# Patient Record
Sex: Female | Born: 1990
Health system: Southern US, Community
[De-identification: ages and names within clinical notes are randomized; demographics above are authoritative.]

## PROBLEM LIST (undated history)

## (undated) DIAGNOSIS — L0390 Cellulitis, unspecified: Secondary | ICD-10-CM

## (undated) DIAGNOSIS — J45909 Unspecified asthma, uncomplicated: Secondary | ICD-10-CM

## (undated) DIAGNOSIS — E119 Type 2 diabetes mellitus without complications: Secondary | ICD-10-CM

## (undated) DIAGNOSIS — E669 Obesity, unspecified: Secondary | ICD-10-CM

## (undated) DIAGNOSIS — I10 Essential (primary) hypertension: Secondary | ICD-10-CM

## (undated) DIAGNOSIS — O1495 Unspecified pre-eclampsia, complicating the puerperium: Secondary | ICD-10-CM

## (undated) DIAGNOSIS — F319 Bipolar disorder, unspecified: Secondary | ICD-10-CM

## (undated) DIAGNOSIS — I2699 Other pulmonary embolism without acute cor pulmonale: Secondary | ICD-10-CM

## (undated) HISTORY — PX: NO PAST SURGERIES: SHX2092

---

## 2009-12-04 ENCOUNTER — Emergency Department (HOSPITAL_COMMUNITY): Admission: EM | Admit: 2009-12-04 | Discharge: 2009-12-04 | Payer: Self-pay | Admitting: Emergency Medicine

## 2009-12-04 IMAGING — US US PELVIS COMPLETE MODIFY
1 series · 14 of 25 positions shown · non-contrast
Comparison: None.

CLINICAL DATA: 17-year-old female with pain.

TRANSABDOMINAL AND TRANSVAGINAL ULTRASOUND OF PELVIS
TECHNIQUE: Both transabdominal and transvaginal ultrasound
examinations of the pelvis were performed including evaluation of
the uterus, ovaries, adnexal regions, and pelvic cul-de-sac.

[Series 1: us pelvis complete modify · 0.30mm/px · 14 of 43 slices shown]
[im 1/43]
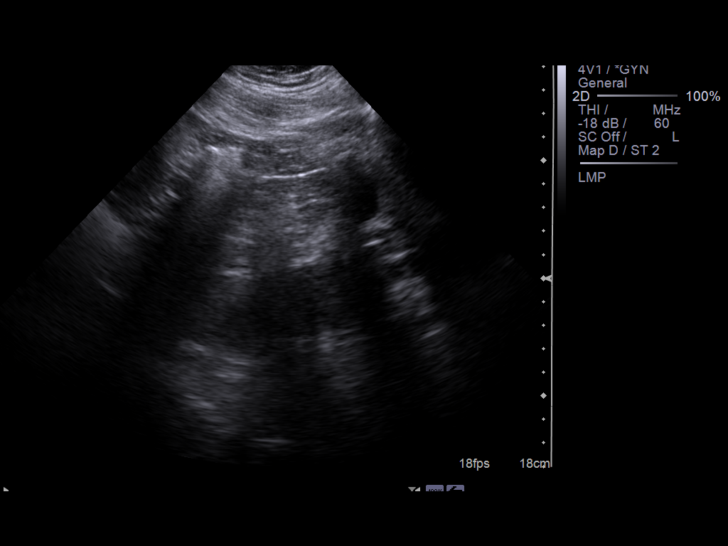
[im 4/43]
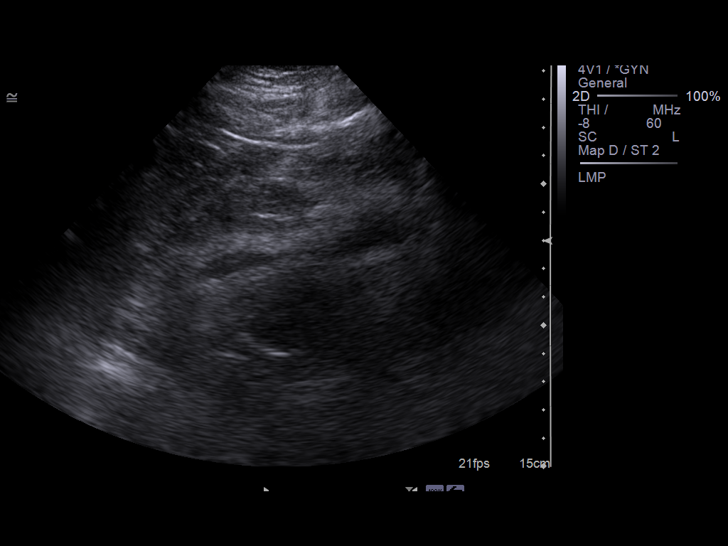
[im 8/43]
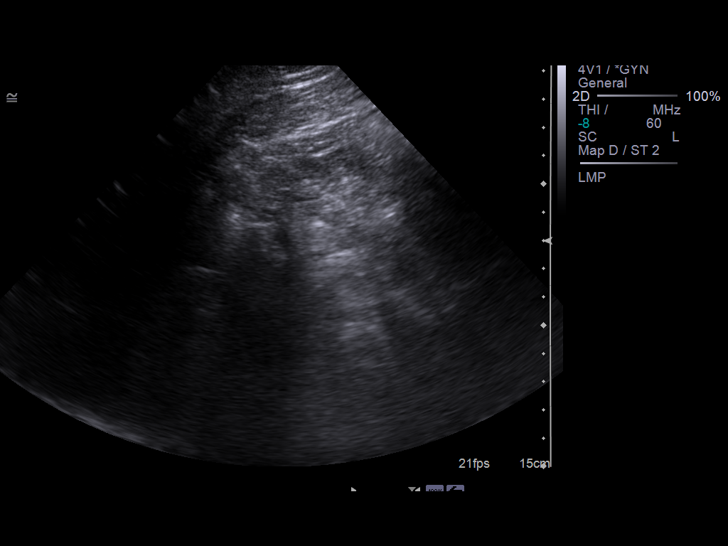
[im 11/43]
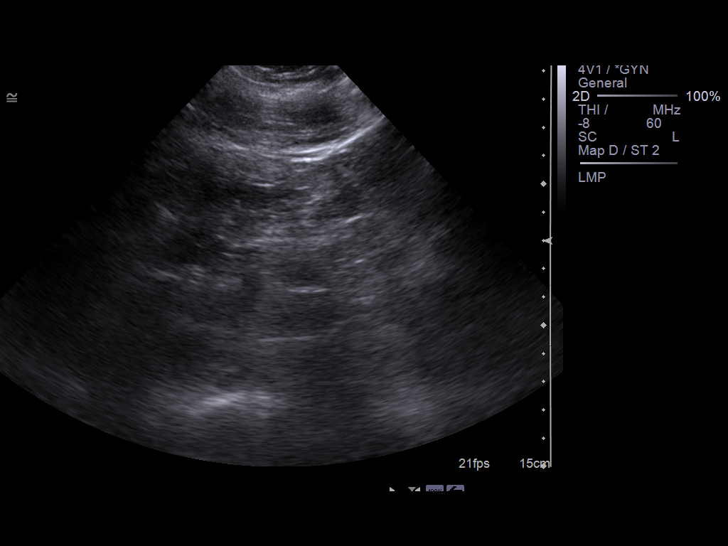
[im 15/43]
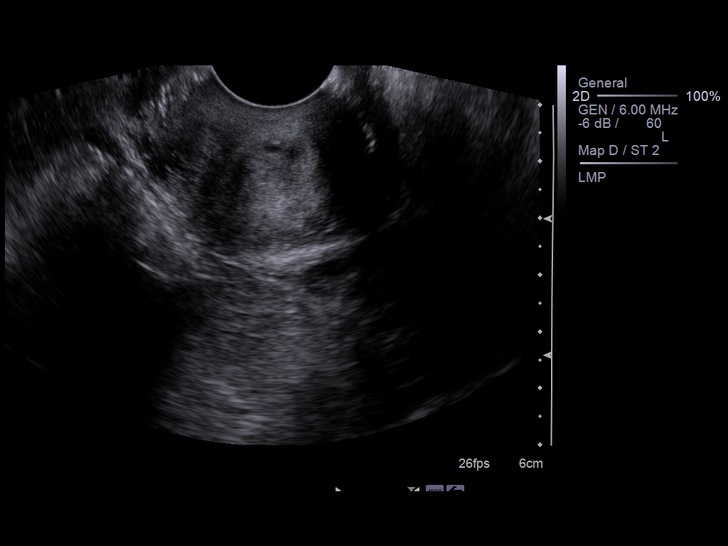
[im 16/43]
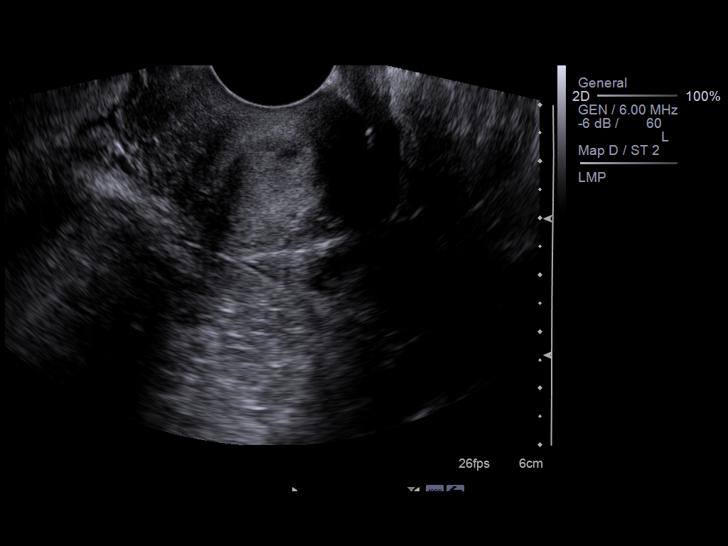
[im 20/43]
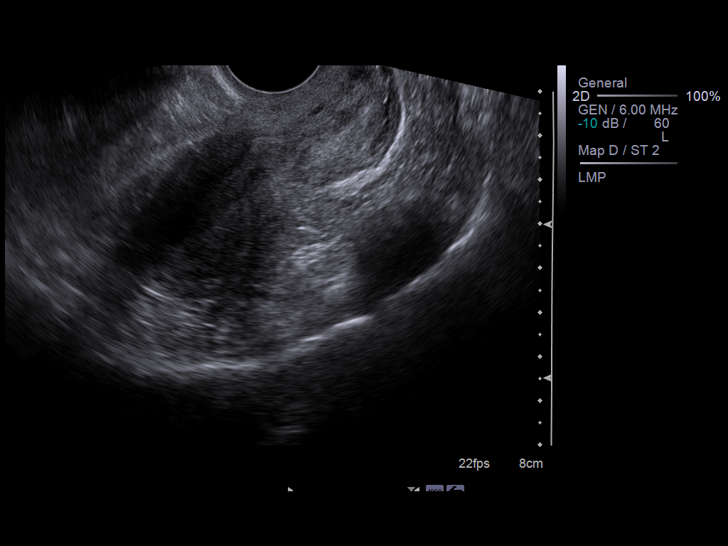
[im 23/43]
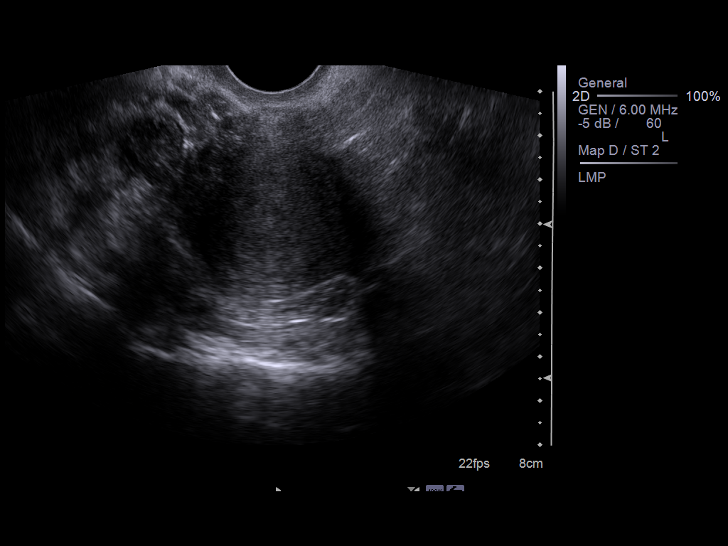
[im 27/43]
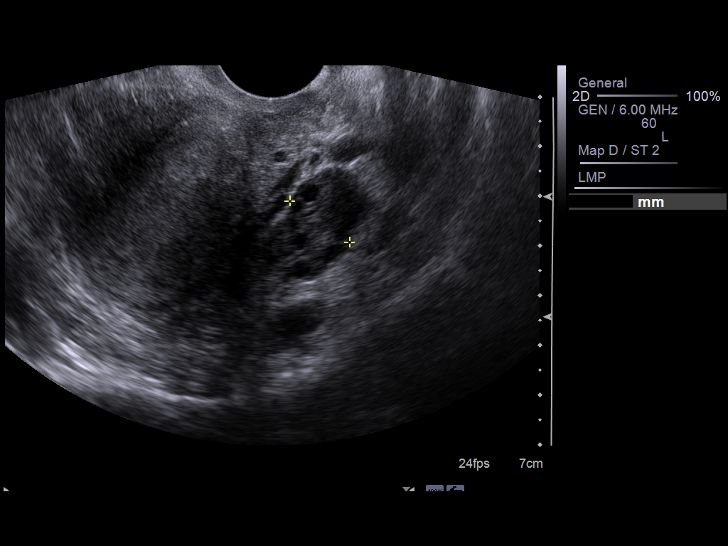
[im 29/43]
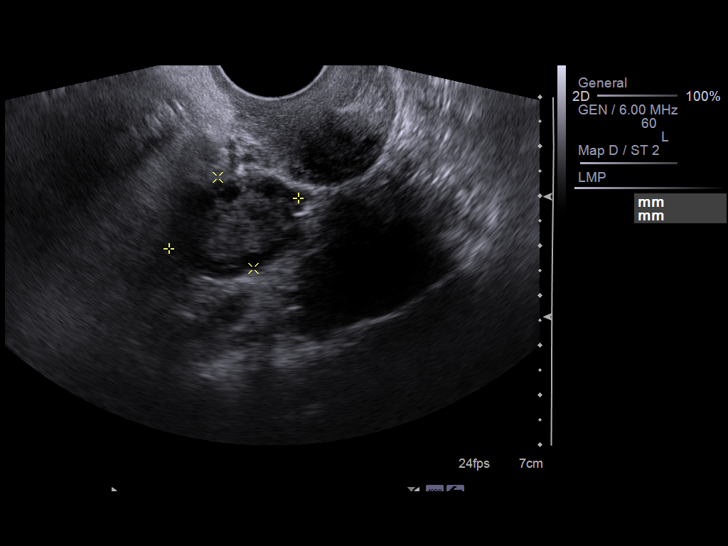
[im 32/43]
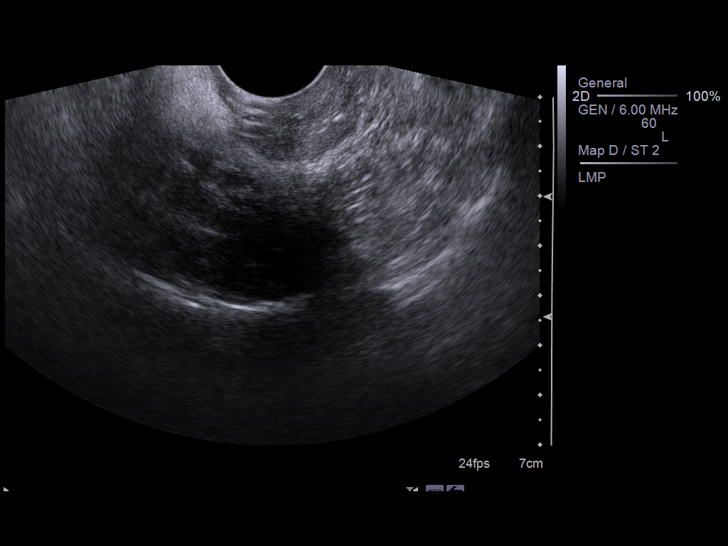
[im 36/43]
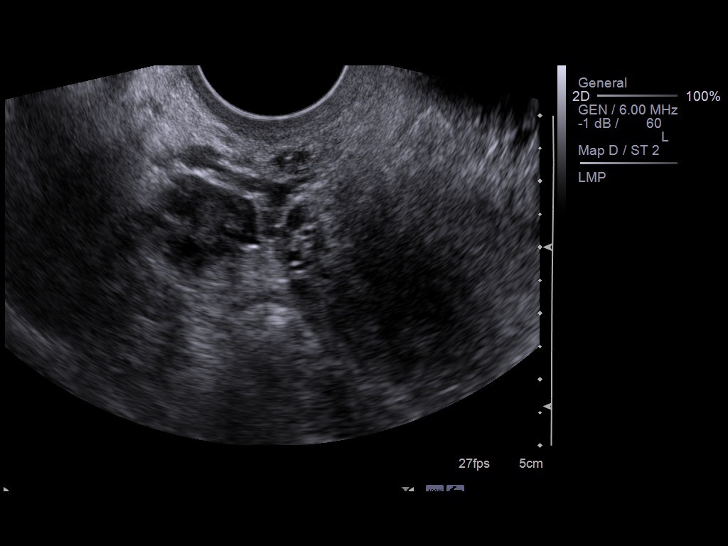
[im 39/43]
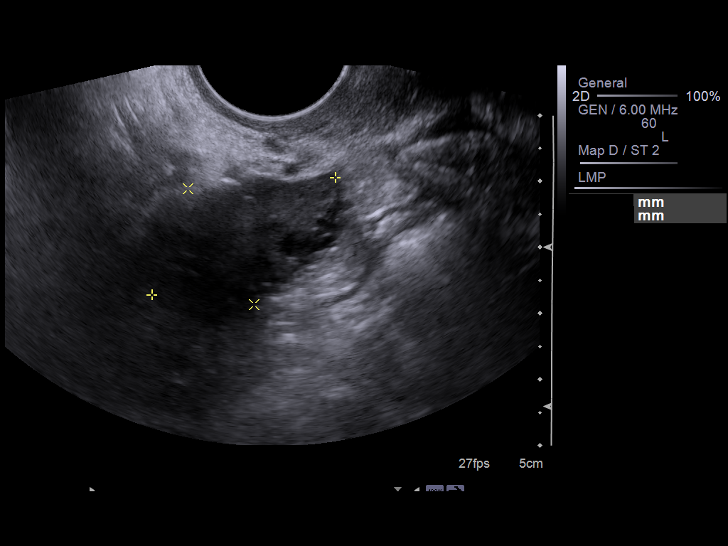
[im 43/43]
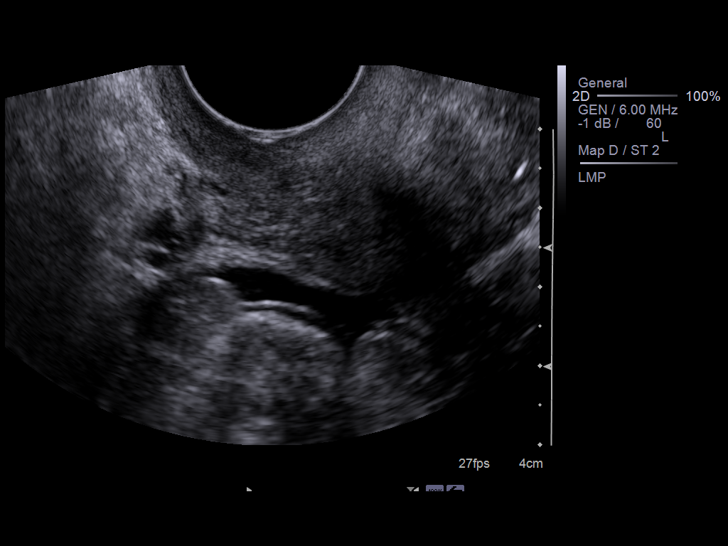

[14 of 25 positions shown; findings below may reference images not displayed]

FINDINGS: Uterus measures 7.2 x 3.3 x 3.9 cm and is within normal limits.

Endometrium measures 6 mm in thickness and is within normal limits.

Right Ovary measures 3.3 x 2.0 x 1.7 cm and is within normal
limits.

Left Ovary measures 2.8 x 2.0 x 1.5 cm and is within normal limits.

Other Findings:  A small volume of simple appearing pelvic free
fluid.
IMPRESSION: Normal.  Likely physiologic small volume of simple fluid.

## 2011-03-16 LAB — GC/CHLAMYDIA PROBE AMP, GENITAL

## 2011-03-16 LAB — COMPREHENSIVE METABOLIC PANEL
ALT: 25 U/L (ref 0–35)
AST: 21 U/L (ref 0–37)
Alkaline Phosphatase: 82 U/L (ref 39–117)
CO2: 24 mEq/L (ref 19–32)
Calcium: 9.3 mg/dL (ref 8.4–10.5)
GFR calc non Af Amer: >60 mL/min (ref >60)
Glucose, Bld: 81 mg/dL (ref 70–99)
Potassium: 3.9 mEq/L (ref 3.5–5.1)
Sodium: 141 mEq/L (ref 135–145)

## 2011-03-16 LAB — DIFFERENTIAL
Basophils Absolute: 0.1 10*3/uL (ref 0.0–0.1)
Eosinophils Absolute: 0.1 10*3/uL (ref 0.0–0.7)
Eosinophils Relative: 1 % (ref 0–5)
Lymphocytes Relative: 38 % (ref 12–46)
Lymphs Abs: 3.4 10*3/uL (ref 0.7–4.0)
Monocytes Absolute: 0.5 10*3/uL (ref 0.1–1.0)

## 2011-03-16 LAB — URINALYSIS, ROUTINE W REFLEX MICROSCOPIC
Bilirubin Urine: NEGATIVE
Glucose, UA: NEGATIVE mg/dL
Ketones, ur: NEGATIVE mg/dL
Protein, ur: NEGATIVE mg/dL
pH: 5.5 (ref 5.0–8.0)

## 2011-03-16 LAB — URINE MICROSCOPIC-ADD ON

## 2011-03-16 LAB — WET PREP, GENITAL: Clue Cells Wet Prep HPF POC: NONE SEEN

## 2011-03-16 LAB — CBC
Platelets: 351 10*3/uL (ref 150–400)
RBC: 4.39 MIL/uL (ref 3.87–5.11)
WBC: 8.9 10*3/uL (ref 4.0–10.5)

## 2011-03-16 LAB — LIPASE, BLOOD: Lipase: 17 U/L (ref 11–59)

## 2011-03-16 LAB — PREGNANCY, URINE

## 2012-04-03 ENCOUNTER — Emergency Department: Payer: Self-pay | Admitting: Emergency Medicine

## 2012-04-03 LAB — CBC
HCT: 38.9 % (ref 35.0–47.0)
MCH: 28.5 pg (ref 26.0–34.0)
MCV: 91 fL (ref 80–100)
RBC: 4.27 10*6/uL (ref 3.80–5.20)
WBC: 11.4 10*3/uL — ABNORMAL HIGH (ref 3.6–11.0)

## 2012-04-03 LAB — COMPREHENSIVE METABOLIC PANEL
Anion Gap: 7 (ref 7–16)
BUN: 14 mg/dL (ref 7–18)
Bilirubin,Total: 0.4 mg/dL (ref 0.2–1.0)
Calcium, Total: 8.9 mg/dL (ref 8.5–10.1)
Chloride: 106 mmol/L (ref 98–107)
Co2: 30 mmol/L (ref 21–32)
EGFR (African American): >60
EGFR (Non-African Amer.): >60
Osmolality: 285 (ref 275–301)
Sodium: 143 mmol/L (ref 136–145)

## 2012-04-03 LAB — TROPONIN I: Troponin-I: <0.02 ng/mL

## 2012-04-03 LAB — CK TOTAL AND CKMB (NOT AT ARMC): CK-MB: 1.2 ng/mL (ref 0.5–3.6)

## 2012-04-03 IMAGING — CR DG CHEST 2V
1 series · 3 of 3 positions shown · non-contrast
Comparison: none

REASON FOR EXAM: chest pain
COMMENTS:

PROCEDURE:     DXR - DXR CHEST PA (OR AP) AND LATERAL  - [DATE]  [DATE]
RESULT:     The lungs are clear. The cardiac silhouette and visualized bony
skeleton are unremarkable.

[Series 1: w chest pa · 0.14mm/px · 3 of 3 slices shown]
[im 1/3]
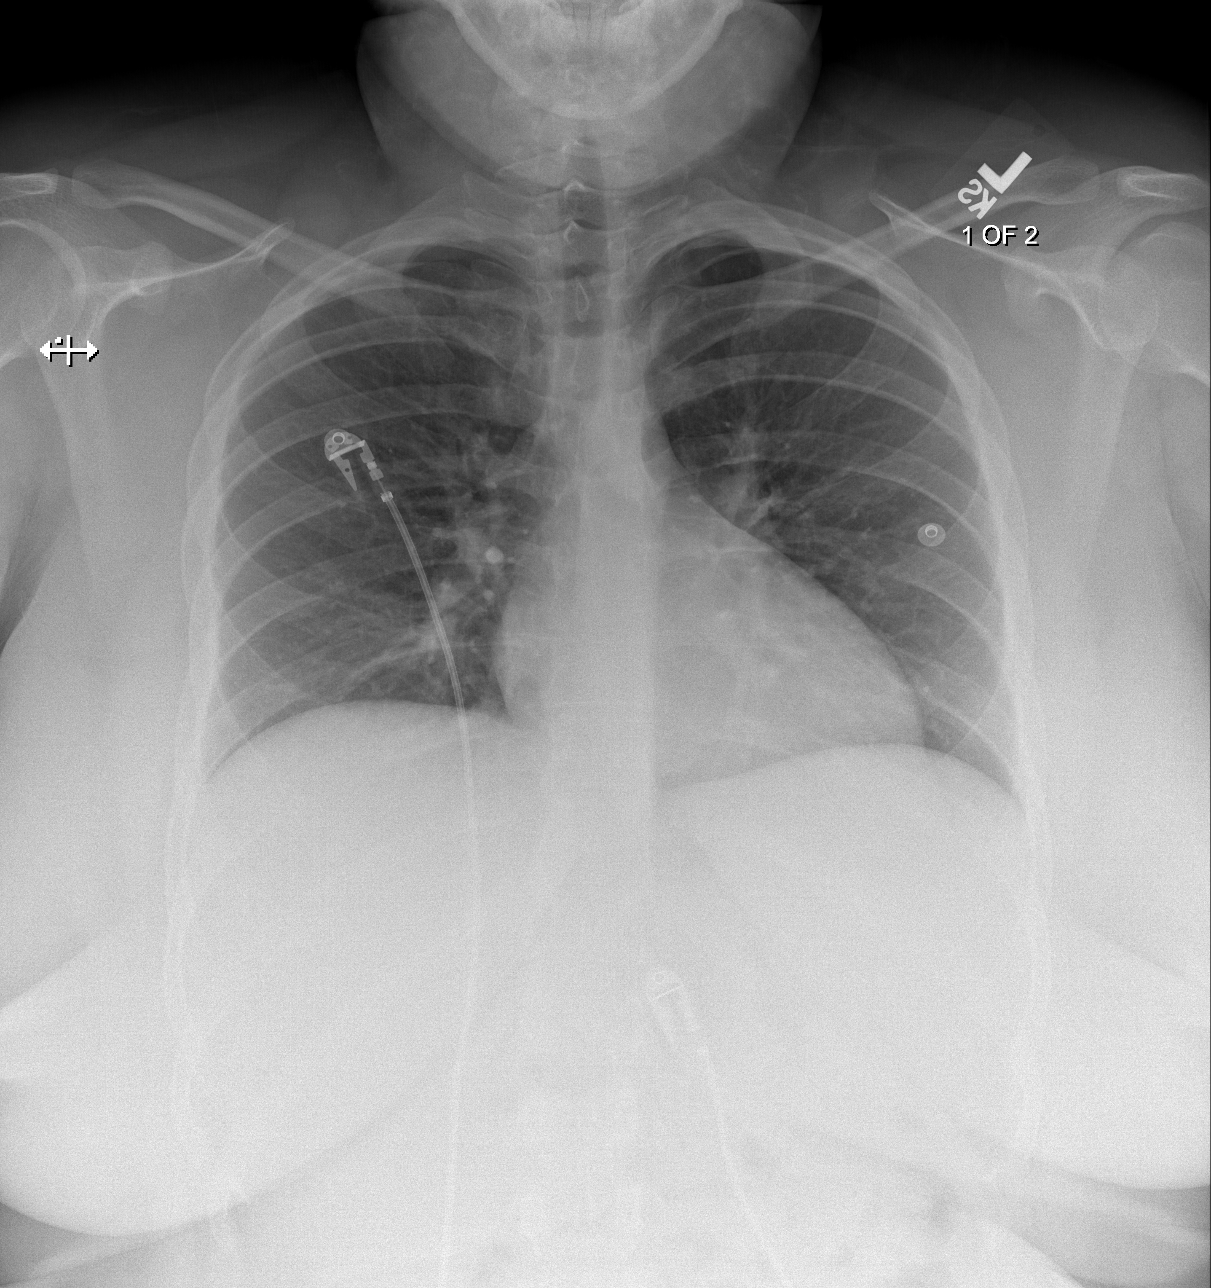
[im 2/3]
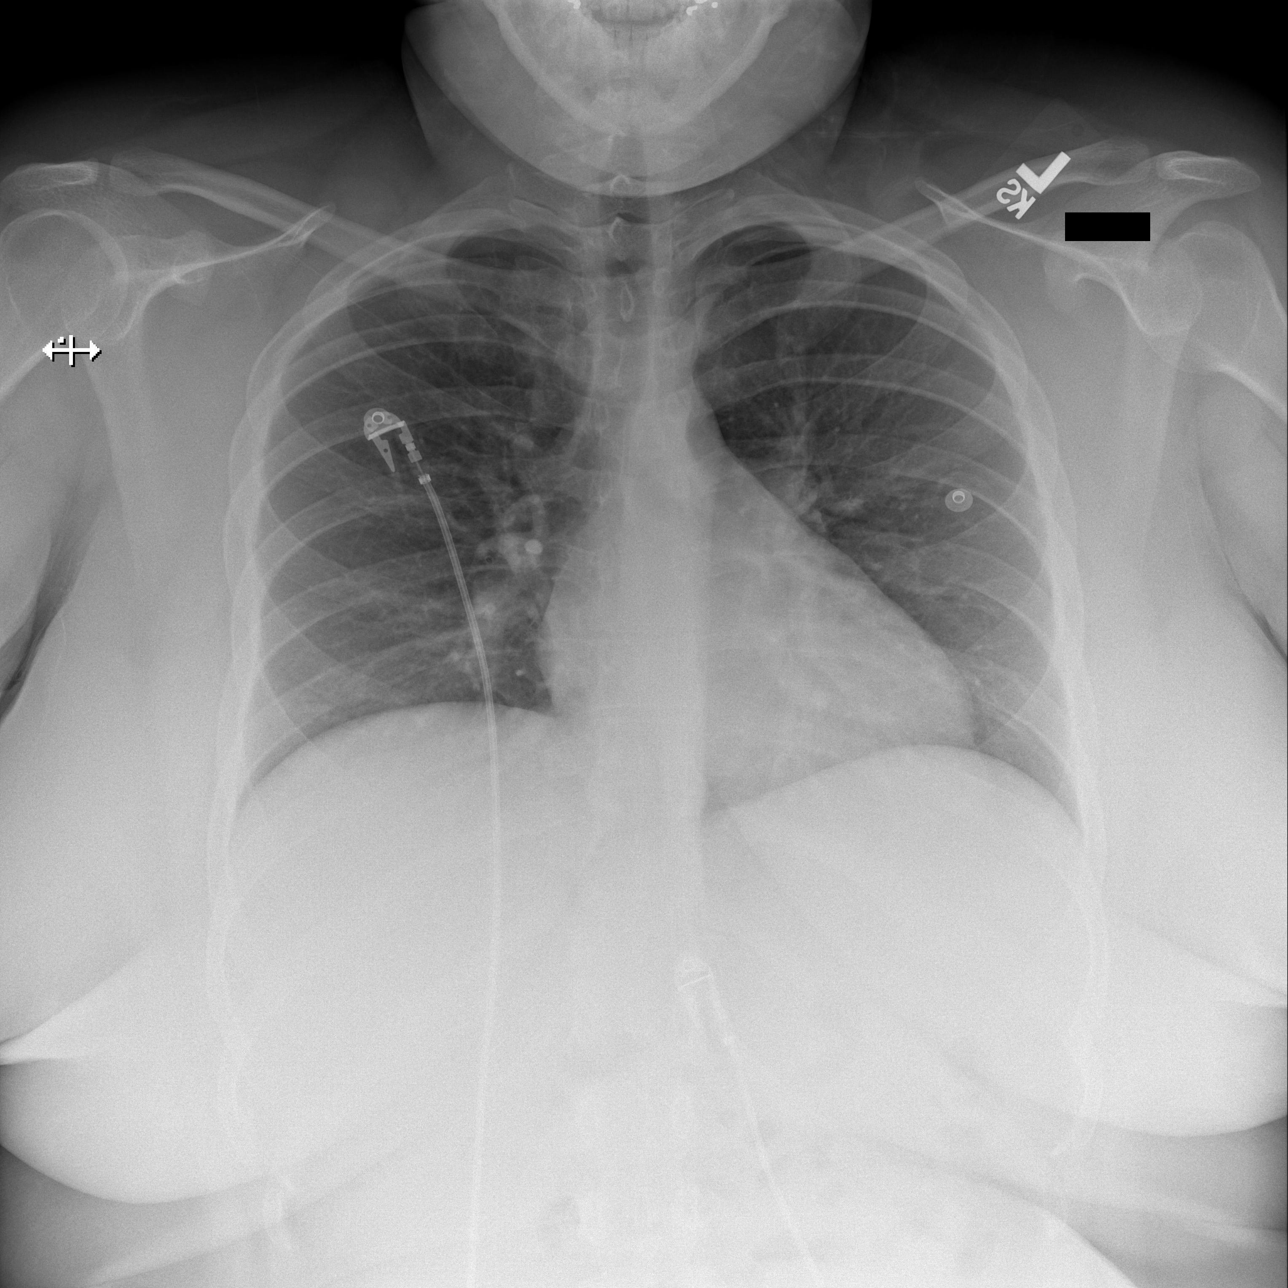
[im 3/3]
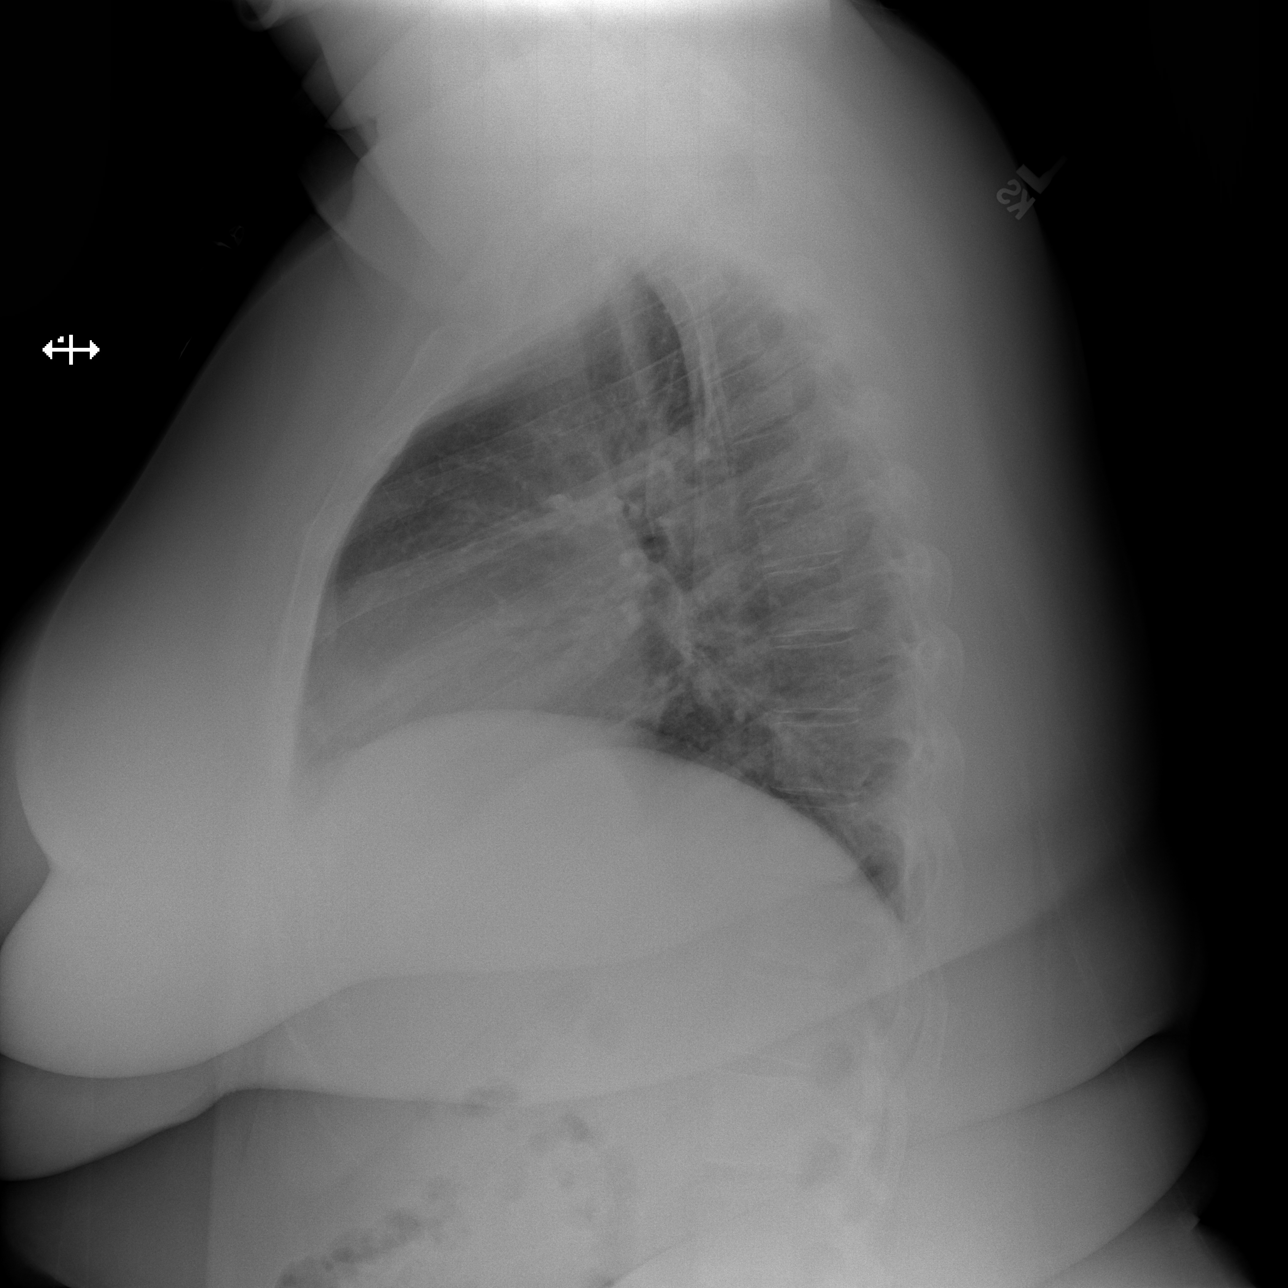

[3 of 3 positions shown; findings below may reference images not displayed]

IMPRESSION: 1. Chest radiograph without evidence of acute cardiopulmonary disease.

## 2013-12-14 DIAGNOSIS — I2699 Other pulmonary embolism without acute cor pulmonale: Secondary | ICD-10-CM

## 2013-12-14 DIAGNOSIS — I82409 Acute embolism and thrombosis of unspecified deep veins of unspecified lower extremity: Secondary | ICD-10-CM

## 2013-12-14 HISTORY — DX: Other pulmonary embolism without acute cor pulmonale: I26.99

## 2013-12-14 HISTORY — DX: Acute embolism and thrombosis of unspecified deep veins of unspecified lower extremity: I82.409

## 2015-11-20 ENCOUNTER — Encounter (HOSPITAL_COMMUNITY): Payer: Self-pay | Admitting: *Deleted

## 2015-11-20 ENCOUNTER — Inpatient Hospital Stay (HOSPITAL_COMMUNITY)
Admission: AD | Admit: 2015-11-20 | Discharge: 2015-11-20 | Discharge status: Against Medical Advice | Payer: Medicaid Other | Source: Ambulatory Visit | Attending: Obstetrics and Gynecology | Admitting: Obstetrics and Gynecology

## 2015-11-20 DIAGNOSIS — H538 Other visual disturbances: Secondary | ICD-10-CM | POA: Diagnosis not present

## 2015-11-20 DIAGNOSIS — R51 Headache: Secondary | ICD-10-CM | POA: Insufficient documentation

## 2015-11-20 DIAGNOSIS — Z3A00 Weeks of gestation of pregnancy not specified: Secondary | ICD-10-CM | POA: Diagnosis not present

## 2015-11-20 DIAGNOSIS — O26891 Other specified pregnancy related conditions, first trimester: Secondary | ICD-10-CM | POA: Diagnosis not present

## 2015-11-20 HISTORY — DX: Unspecified asthma, uncomplicated: J45.909

## 2015-11-20 LAB — GLUCOSE, CAPILLARY: GLUCOSE-CAPILLARY: 119 mg/dL — AB (ref 65–99)

## 2015-11-20 NOTE — MAU Note (Addendum)
Been having problems with blurry vision and bad headache for 2 days, off and on. Some relief with Tylenol.  Denies problems with preg.  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 Next appt is 12/13.  Denies increase in swelling or epigastric pain

## 2016-02-17 ENCOUNTER — Encounter (HOSPITAL_COMMUNITY): Payer: Self-pay | Admitting: *Deleted

## 2016-02-17 ENCOUNTER — Inpatient Hospital Stay (HOSPITAL_COMMUNITY): Payer: Medicaid Other

## 2016-02-17 ENCOUNTER — Inpatient Hospital Stay (HOSPITAL_COMMUNITY)
Admission: AD | Admit: 2016-02-17 | Discharge: 2016-02-17 | Disposition: A | Discharge status: Home / Self Care | Payer: Medicaid Other | Source: Ambulatory Visit | Attending: Family Medicine | Admitting: Family Medicine

## 2016-02-17 DIAGNOSIS — R51 Headache: Secondary | ICD-10-CM | POA: Diagnosis not present

## 2016-02-17 DIAGNOSIS — J45909 Unspecified asthma, uncomplicated: Secondary | ICD-10-CM | POA: Diagnosis not present

## 2016-02-17 DIAGNOSIS — J069 Acute upper respiratory infection, unspecified: Secondary | ICD-10-CM | POA: Diagnosis present

## 2016-02-17 DIAGNOSIS — E119 Type 2 diabetes mellitus without complications: Secondary | ICD-10-CM | POA: Insufficient documentation

## 2016-02-17 HISTORY — DX: Type 2 diabetes mellitus without complications: E11.9

## 2016-02-17 LAB — CBC
HEMATOCRIT: 39.1 % (ref 36.0–46.0)
Hemoglobin: 12.3 g/dL (ref 12.0–15.0)
MCH: 27.8 pg (ref 26.0–34.0)
MCHC: 31.5 g/dL (ref 30.0–36.0)
MCV: 88.3 fL (ref 78.0–100.0)
Platelets: 361 10*3/uL (ref 150–400)
RBC: 4.43 MIL/uL (ref 3.87–5.11)
RDW: 15.3 % (ref 11.5–15.5)
WBC: 12.6 10*3/uL — ABNORMAL HIGH (ref 4.0–10.5)

## 2016-02-17 LAB — URINALYSIS, ROUTINE W REFLEX MICROSCOPIC
BILIRUBIN URINE: NEGATIVE
GLUCOSE, UA: NEGATIVE mg/dL
HGB URINE DIPSTICK: NEGATIVE
Ketones, ur: NEGATIVE mg/dL
Leukocytes, UA: NEGATIVE
Nitrite: NEGATIVE
Protein, ur: NEGATIVE mg/dL
SPECIFIC GRAVITY, URINE: 1.015 (ref 1.005–1.030)
pH: 7 (ref 5.0–8.0)

## 2016-02-17 LAB — POCT PREGNANCY, URINE: Preg Test, Ur: NEGATIVE

## 2016-02-17 IMAGING — CR DG CHEST 2V
2 series · 2 of 2 positions shown · non-contrast
Comparison: [DATE]

CLINICAL DATA: Cough and fever

EXAM:
CHEST  2 VIEW

[chest pa]
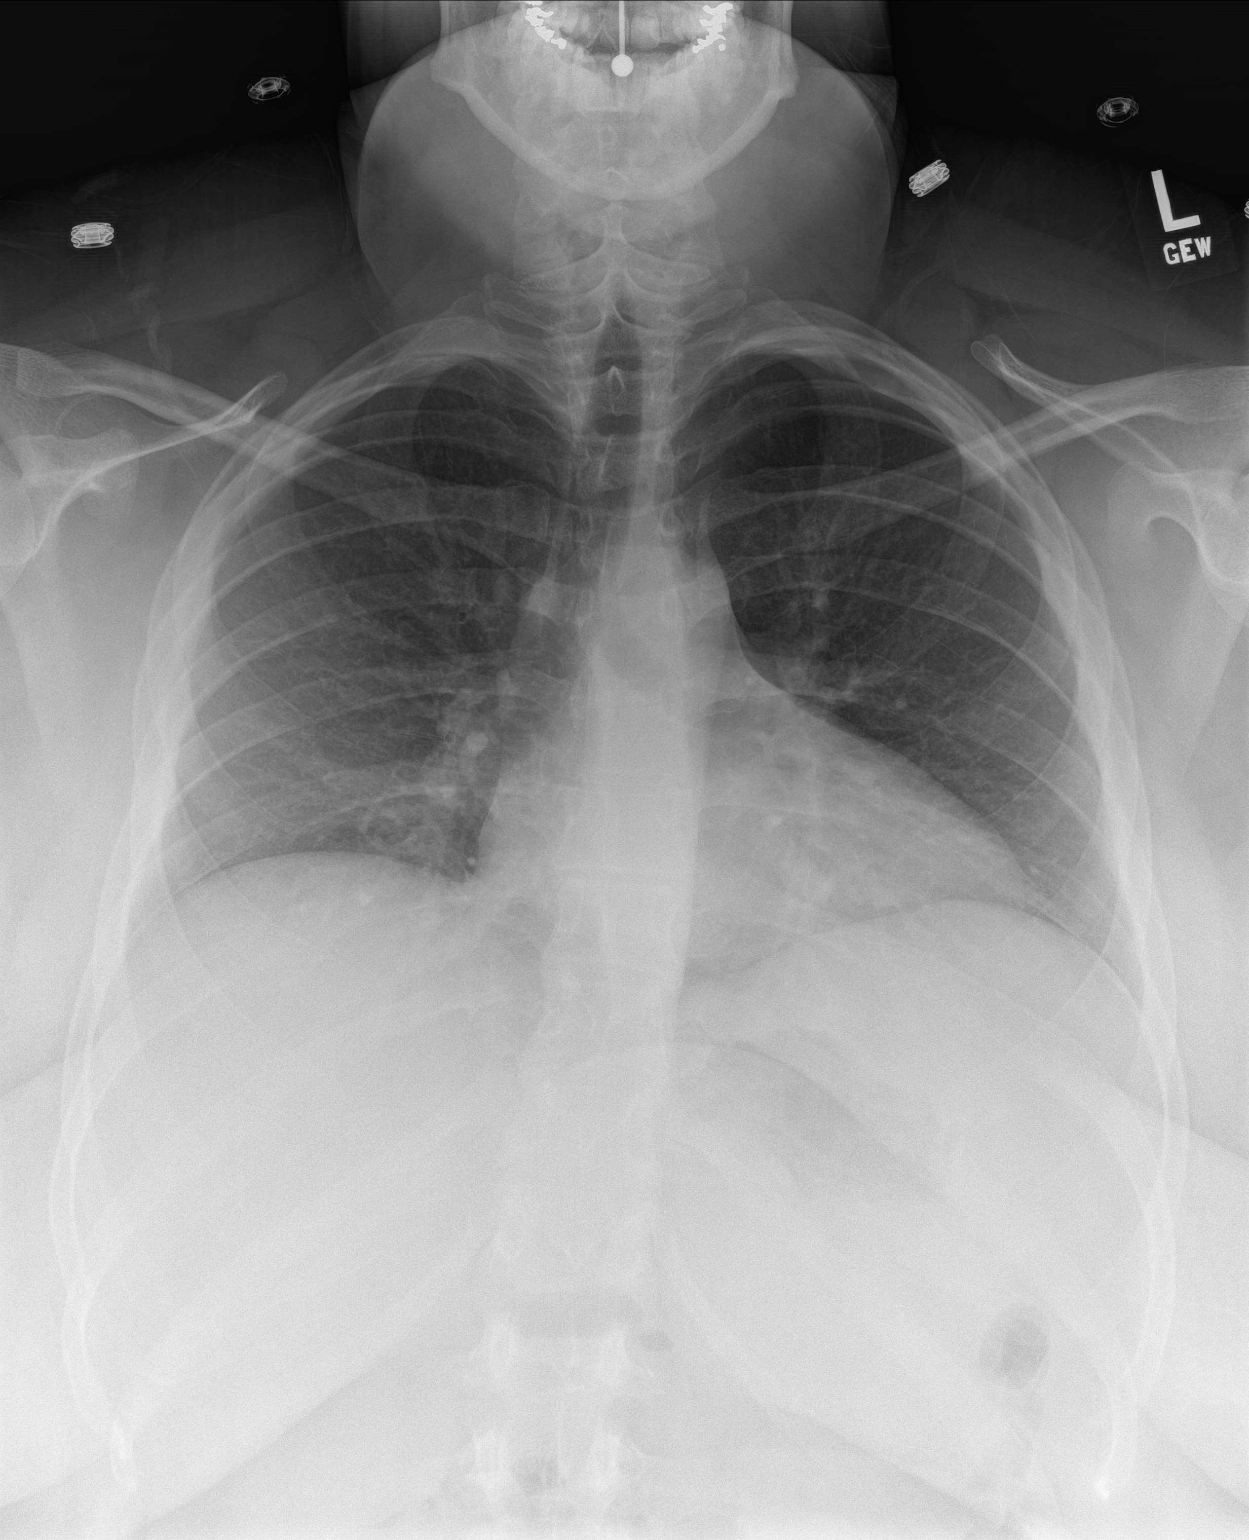

[chest lat]
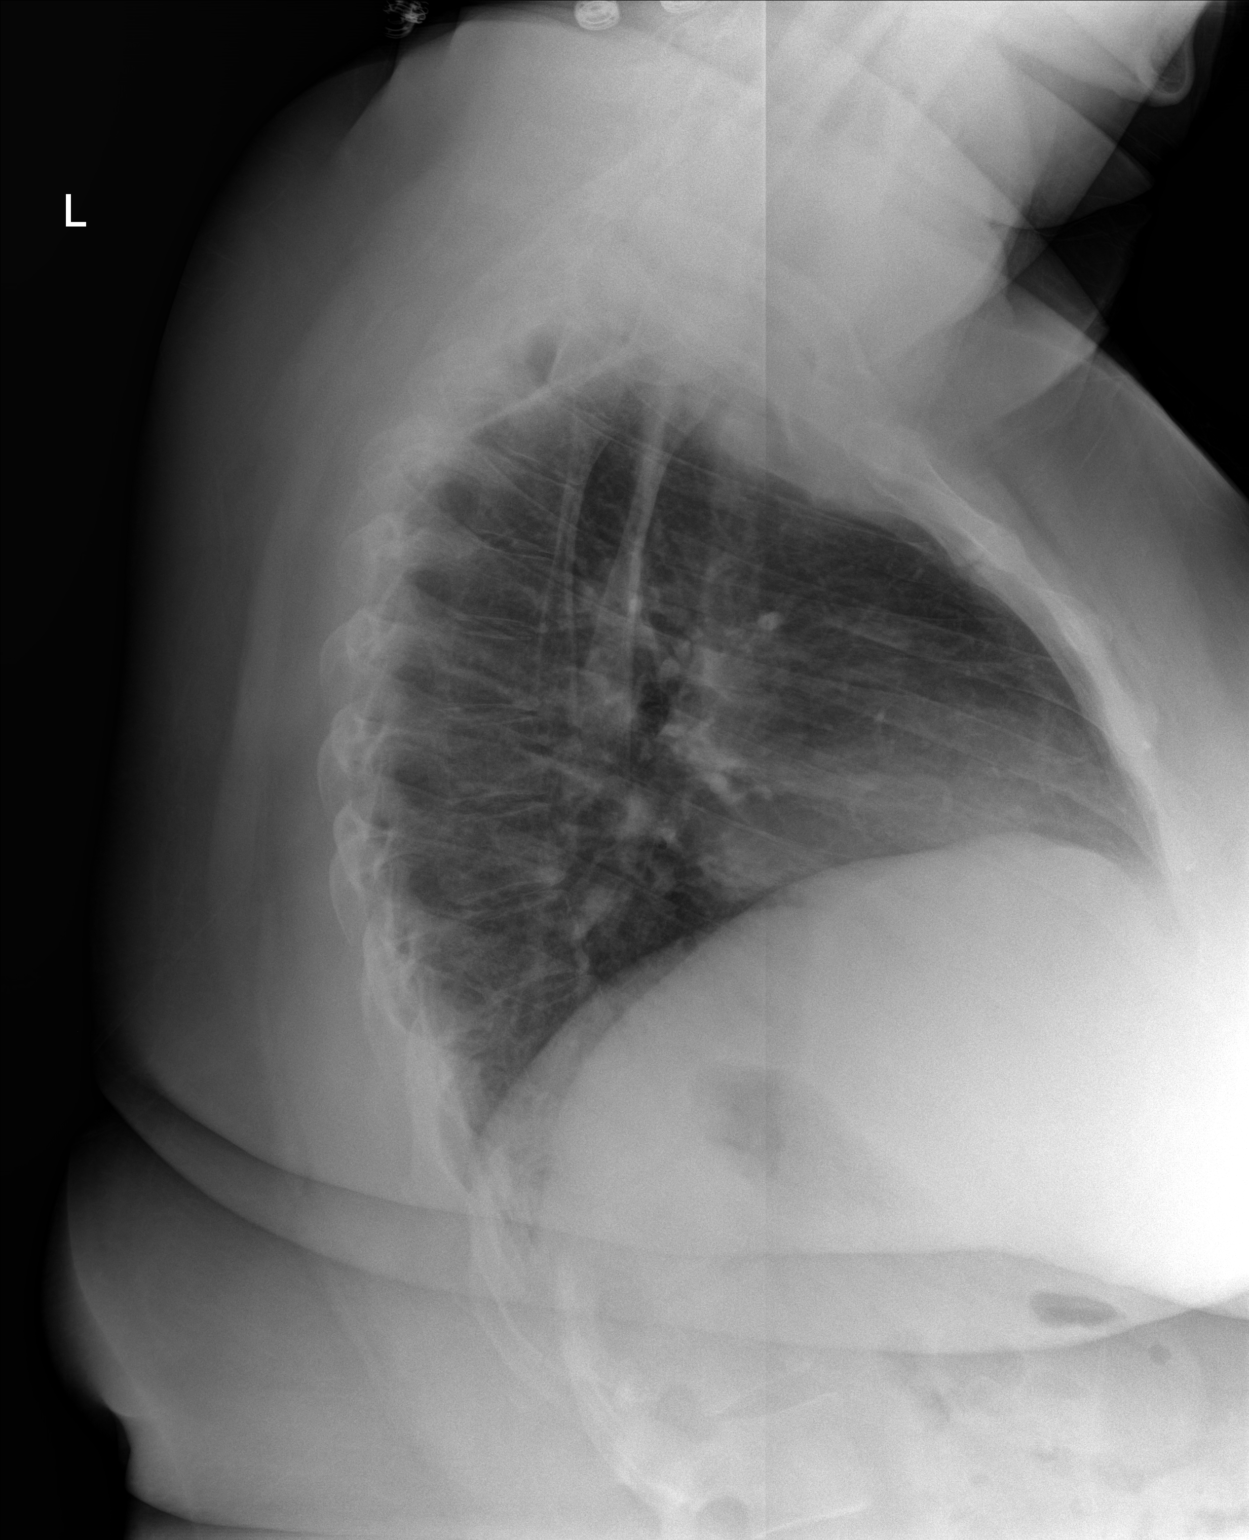

[2 of 2 positions shown; findings below may reference images not displayed]

FINDINGS: The heart size and mediastinal contours are within normal limits.
Both lungs are clear. The visualized skeletal structures are
unremarkable.
IMPRESSION: No active cardiopulmonary disease.

## 2016-02-17 MED ORDER — IPRATROPIUM-ALBUTEROL 0.5-2.5 (3) MG/3ML IN SOLN
3.0000 mL | Freq: Once | RESPIRATORY_TRACT | Status: AC
  Administered 2016-02-17: 3 mL via RESPIRATORY_TRACT
  Filled 2016-02-17: qty 3

## 2016-02-17 MED ORDER — BENZONATATE 100 MG PO CAPS: 100.0000 mg | ORAL_CAPSULE | Freq: Three times a day (TID) | ORAL | Status: DC

## 2016-02-17 MED ORDER — ALBUTEROL SULFATE (2.5 MG/3ML) 0.083% IN NEBU: 2.5000 mg | INHALATION_SOLUTION | Freq: Once | RESPIRATORY_TRACT | Status: DC

## 2016-02-17 MED ORDER — METHYLPREDNISOLONE 4 MG PO TBPK: ORAL_TABLET | ORAL | Status: DC

## 2016-02-17 MED ORDER — IPRATROPIUM BROMIDE 0.02 % IN SOLN: 0.5000 mg | Freq: Once | RESPIRATORY_TRACT | Status: DC

## 2016-02-17 NOTE — MAU Note (Signed)
Pt reports she has had nausea and vomiting for the last 2 weeks, has had a cough for the last week. Feels weak and dizzy. Unsure if she has had a fever.

## 2016-02-17 NOTE — MAU Provider Note (Signed)
History     CSN: [id number]  Arrival date and time: 02/17/16 1924   First Provider Initiated Contact with Patient 02/17/16 2028       Chief Complaint  Patient presents with  . Cough  . Fever  . Chest Pain   HPI Comments: Felicia Bradley is a 25 y.o. Female who presents for cough, fever, & chest pain. Cold like symptoms x 2 weeks. Has pulmonologist for her asthma [facility]), but has not called them. Reports using her albuterol inhaler 4x per day x 2 weeks.  Denies sick contacts. Did get flu vaccine this season.   Cough Episode onset: x 2 weeks. The problem has been gradually worsening. The problem occurs constantly. The cough is productive of purulent sputum. Associated symptoms include chest pain, chills, ear pain, a fever, headaches, rhinorrhea, shortness of breath and wheezing. Pertinent negatives include no heartburn, hemoptysis, myalgias, rash, sore throat or sweats. She has tried steroid inhaler, OTC cough suppressant, leukotriene antagonists and a beta-agonist inhaler for the symptoms. The treatment provided no relief. Her past medical history is significant for asthma.  Fever  The current episode started today. The problem has been resolved. The maximum temperature noted was 103 to 103.9 F. The temperature was taken using an oral thermometer. Associated symptoms include chest pain, coughing, ear pain, headaches and wheezing. Pertinent negatives include no abdominal pain, nausea, rash, sore throat or vomiting. She has tried acetaminophen for the symptoms. The treatment provided moderate relief.  Chest Pain  The current episode started yesterday. The problem has been unchanged. The pain is present in the substernal region. The pain does not radiate. Associated symptoms include a cough, a fever, headaches, shortness of breath and sputum production. Pertinent negatives include no abdominal pain, back pain, diaphoresis, hemoptysis, nausea, palpitations, syncope or vomiting. The  cough is productive. There is no color change associated with the cough. Nothing relieves the cough. The cough is worsened by activity. The pain is aggravated by deep breathing and coughing. She has tried nothing for the symptoms. Risk factors include lack of exercise, obesity and sedentary lifestyle.  Her past medical history is significant for diabetes.  Her family medical history is significant for diabetes.    OB History    Gravida Para Term Preterm AB TAB SAB Ectopic Multiple Living   1    1  1    0      Past Medical History  Diagnosis Date  . Asthma   . Diabetes mellitus without complication (HCC)     type 2    Past Surgical History  Procedure Laterality Date  . No past surgeries      Family History  Problem Relation Age of Onset  . Diabetes Mother   . Asthma Mother   . Asthma Maternal Aunt   . Diabetes Maternal Aunt     Social History  Substance Use Topics  . Smoking status: Never Smoker   . Smokeless tobacco: Never Used  . Alcohol Use: No    Allergies:  Allergies  Allergen Reactions  . Contrast Media [Iodinated Diagnostic Agents] Anaphylaxis  . Dilaudid [Hydromorphone] Anaphylaxis  . Tramadol Itching  . Apple Hives  . Banana Hives  . Ketorolac Itching  . Other Hives    Zucchini  . Toradol [Ketorolac Tromethamine] Hives    Prescriptions prior to admission  Medication Sig Dispense Refill Last Dose  . acetaminophen (TYLENOL) 325 MG tablet Take 325 mg by mouth every 6 (six) hours as needed for moderate pain.  02/17/2016 at Unknown time  . albuterol (VENTOLIN HFA) 108 (90 Base) MCG/ACT inhaler Inhale 1 puff into the lungs every 6 (six) hours as needed for wheezing or shortness of breath.    02/17/2016 at Unknown time  . beclomethasone (QVAR) 80 MCG/ACT inhaler Inhale 1 puff into the lungs 2 (two) times daily.   02/16/2016 at Unknown time  . EPINEPHrine 0.3 mg/0.3 mL IJ SOAJ injection Inject 0.3 mg into the muscle once.    prn  . ferrous sulfate 325 (65 FE) MG EC  tablet Take 325 mg by mouth daily.   02/17/2016 at Unknown time  . Fluticasone-Salmeterol (ADVAIR DISKUS) 500-50 MCG/DOSE AEPB Inhale 1 puff into the lungs 2 (two) times daily.   02/17/2016 at Unknown time  . gabapentin (NEURONTIN) 300 MG capsule Take 300 mg by mouth 2 (two) times daily.   02/16/2016 at Unknown time  . guaiFENesin (MUCINEX) 600 MG 12 hr tablet Take 600 mg by mouth 2 (two) times daily as needed for cough or to loosen phlegm.   02/17/2016 at Unknown time  . montelukast (SINGULAIR) 10 MG tablet Take 10 mg by mouth daily.   Past Month at Unknown time  . Prenatal Vit-Fe Fumarate-FA (PRENATAL MULTIVITAMIN) TABS tablet Take 1 tablet by mouth daily at 12 noon.   02/16/2016 at Unknown time  . traZODone (DESYREL) 150 MG tablet Take 300 mg by mouth at bedtime.   02/16/2016 at Unknown time    Review of Systems  Constitutional: Positive for fever and chills. Negative for diaphoresis.  HENT: Positive for ear pain and rhinorrhea. Negative for sore throat.   Respiratory: Positive for cough, sputum production, shortness of breath and wheezing. Negative for hemoptysis.   Cardiovascular: Positive for chest pain. Negative for palpitations and syncope.  Gastrointestinal: Negative.  Negative for heartburn, nausea, vomiting and abdominal pain.  Musculoskeletal: Negative for myalgias and back pain.  Skin: Negative for rash.  Neurological: Positive for headaches.   Physical Exam   Blood pressure 136/100, pulse 111, temperature 99.1 F (37.3 C), temperature source Oral, resp. rate 20, height  (1.6 m), weight 291 lb (131.997 kg), last menstrual period 11/16/2015, SpO2 100 %.  Physical Exam  Nursing note and vitals reviewed. Constitutional: She is oriented to person, place, and time. She appears well-developed and well-nourished. She appears distressed.  HENT:  Head: Normocephalic and atraumatic.  Right Ear: Tympanic membrane and external ear normal. No drainage.  Left Ear: Tympanic membrane and external  ear normal. No drainage.  Nose: No mucosal edema or nasal deformity. Right sinus exhibits no maxillary sinus tenderness and no frontal sinus tenderness. Left sinus exhibits no maxillary sinus tenderness and no frontal sinus tenderness.  Mouth/Throat: Uvula is midline and oropharynx is clear and moist. No oropharyngeal exudate or posterior oropharyngeal erythema.  Eyes: Conjunctivae are normal. Right eye exhibits no discharge. Left eye exhibits no discharge. No scleral icterus.  Neck: Normal range of motion.  Cardiovascular: Regular rhythm and normal heart sounds.  Tachycardia present.   No murmur heard. Respiratory: Effort normal. No respiratory distress. She has decreased breath sounds in the right lower field and the left lower field. She has wheezes.  GI: Soft.  Neurological: She is alert and oriented to person, place, and time.  Skin: Skin is warm and dry. She is not diaphoretic.  Psychiatric: She has a normal mood and affect. Her behavior is normal. Judgment and thought content normal.    MAU Course  Procedures Results for orders placed or performed during  the hospital encounter of 02/17/16 (from the past 24 hour(s))  Urinalysis, Routine w reflex microscopic (not at [facility])     Status: Abnormal   Collection Time: 02/17/16  7:35 PM  Result Value Ref Range   Color, Urine YELLOW YELLOW   APPearance HAZY (A) CLEAR   Specific Gravity, Urine 1.015 1.005 - 1.030   pH 7.0 5.0 - 8.0   Glucose, UA NEGATIVE NEGATIVE mg/dL   Hgb urine dipstick NEGATIVE NEGATIVE   Bilirubin Urine NEGATIVE NEGATIVE   Ketones, ur NEGATIVE NEGATIVE mg/dL   Protein, ur NEGATIVE NEGATIVE mg/dL   Nitrite NEGATIVE NEGATIVE   Leukocytes, UA NEGATIVE NEGATIVE  Pregnancy, urine POC     Status: None   Collection Time: 02/17/16  8:03 PM  Result Value Ref Range   Preg Test, Ur NEGATIVE NEGATIVE  CBC     Status: Abnormal   Collection Time: 02/17/16  8:22 PM  Result Value Ref Range   WBC 12.6 (H) 4.0 - 10.5 K/uL   RBC  4.43 3.87 - 5.11 MIL/uL   Hemoglobin 12.3 12.0 - 15.0 g/dL   HCT 16.1 09.6 - 04.5 %   MCV 88.3 78.0 - 100.0 fL   MCH 27.8 26.0 - 34.0 pg   MCHC 31.5 30.0 - 36.0 g/dL   RDW 40.9 81.1 - 91.4 %   Platelets 361 150 - 400 K/uL   Dg Chest 2 View  02/17/2016  CLINICAL DATA:  Cough and fever EXAM: CHEST  2 VIEW COMPARISON:  04/03/2012 FINDINGS: The heart size and mediastinal contours are within normal limits. Both lungs are clear. The visualized skeletal structures are unremarkable. IMPRESSION: No active cardiopulmonary disease. Electronically Signed   By: Marlan Palau M.D.   On: 02/17/2016 21:53    MDM UPT negative Duo neb treatment Continuous pulse ox >99% EKG & chest xray ordered Chest x ray negative EKG, sinus tachy - otherwise normal per Dr. Antoine Poche with cardiology Pt reports improved symptoms since nebulizer - per auscultation, wheezes decreased & lower lobe breath sounds improved Assessment and Plan  A:  1. Acute upper respiratory infection     P; Discharge home Rx tessalon perls & medrol dose pack Call pulmonologist in morning for f/u. If symptoms worsen or don't improve go to ER Flu swab pending  Judeth Horn 02/17/2016, 8:18 PM

## 2016-02-17 NOTE — Discharge Instructions (Signed)
Upper Respiratory Infection, Adult Most upper respiratory infections (URIs) are a viral infection of the air passages leading to the lungs. A URI affects the nose, throat, and upper air passages. The most common type of URI is nasopharyngitis and is typically referred to as "the common cold." URIs run their course and usually go away on their own. Most of the time, a URI does not require medical attention, but sometimes a bacterial infection in the upper airways can follow a viral infection. This is called a secondary infection. Sinus and middle ear infections are common types of secondary upper respiratory infections. Bacterial pneumonia can also complicate a URI. A URI can worsen asthma and chronic obstructive pulmonary disease (COPD). Sometimes, these complications can require emergency medical care and may be life threatening.  CAUSES Almost all URIs are caused by viruses. A virus is a type of germ and can spread from one person to another.  RISKS FACTORS You may be at risk for a URI if:   You smoke.   You have chronic heart or lung disease.  You have a weakened defense (immune) system.   You are very young or very old.   You have nasal allergies or asthma.  You work in crowded or poorly ventilated areas.  You work in health care facilities or schools. SIGNS AND SYMPTOMS  Symptoms typically develop 2-3 days after you come in contact with a cold virus. Most viral URIs last 7-10 days. However, viral URIs from the influenza virus (flu virus) can last 14-18 days and are typically more severe. Symptoms may include:   Runny or stuffy (congested) nose.   Sneezing.   Cough.   Sore throat.   Headache.   Fatigue.   Fever.   Loss of appetite.   Pain in your forehead, behind your eyes, and over your cheekbones (sinus pain).  Muscle aches.  DIAGNOSIS  Your health care provider may diagnose a URI by:  Physical exam.  Tests to check that your symptoms are not due to  another condition such as:  Strep throat.  Sinusitis.  Pneumonia.  Asthma. TREATMENT  A URI goes away on its own with time. It cannot be cured with medicines, but medicines may be prescribed or recommended to relieve symptoms. Medicines may help:  Reduce your fever.  Reduce your cough.  Relieve nasal congestion. HOME CARE INSTRUCTIONS   Take medicines only as directed by your health care provider.   Gargle warm saltwater or take cough drops to comfort your throat as directed by your health care provider.  Use a warm mist humidifier or inhale steam from a shower to increase air moisture. This may make it easier to breathe.  Drink enough fluid to keep your urine clear or pale yellow.   Eat soups and other clear broths and maintain good nutrition.   Rest as needed.   Return to work when your temperature has returned to normal or as your health care provider advises. You may need to stay home longer to avoid infecting others. You can also use a face mask and careful hand washing to prevent spread of the virus.  Increase the usage of your inhaler if you have asthma.   Do not use any tobacco products, including cigarettes, chewing tobacco, or electronic cigarettes. If you need help quitting, ask your health care provider. PREVENTION  The best way to protect yourself from getting a cold is to practice good hygiene.   Avoid oral or hand contact with people with cold   symptoms.   Wash your hands often if contact occurs.  There is no clear evidence that vitamin C, vitamin E, echinacea, or exercise reduces the chance of developing a cold. However, it is always recommended to get plenty of rest, exercise, and practice good nutrition.  SEEK MEDICAL CARE IF:   You are getting worse rather than better.   Your symptoms are not controlled by medicine.   You have chills.  You have worsening shortness of breath.  You have brown or red mucus.  You have yellow or brown nasal  discharge.  You have pain in your face, especially when you bend forward.  You have a fever.  You have swollen neck glands.  You have pain while swallowing.  You have white areas in the back of your throat. SEEK IMMEDIATE MEDICAL CARE IF:   You have severe or persistent:  Headache.  Ear pain.  Sinus pain.  Chest pain.  You have chronic lung disease and any of the following:  Wheezing.  Prolonged cough.  Coughing up blood.  A change in your usual mucus.  You have a stiff neck.  You have changes in your:  Vision.  Hearing.  Thinking.  Mood. MAKE SURE YOU:   Understand these instructions.  Will watch your condition.  Will get help right away if you are not doing well or get worse.   This information is not intended to replace advice given to you by your health care provider. Make sure you discuss any questions you have with your health care provider.   Document Released: 05/26/2001 Document Revised: 04/16/2015 Document Reviewed: 03/07/2014 Elsevier Interactive Patient Education 2016 [employer]  

## 2016-02-18 LAB — INFLUENZA PANEL BY PCR (TYPE A & B)
H1N1FLUPCR: NOT DETECTED
INFLAPCR: NEGATIVE
INFLBPCR: NEGATIVE

## 2016-10-12 IMAGING — US US TRANSVAGINAL NON-OB
1 series · 13 of 25 positions shown · non-contrast
Comparison: None

CLINICAL DATA: Pelvic pain, right worse left.



[Series 1: us transvaginal non-ob · 0.25mm/px · 13 of 41 slices shown]
[im 1/41]
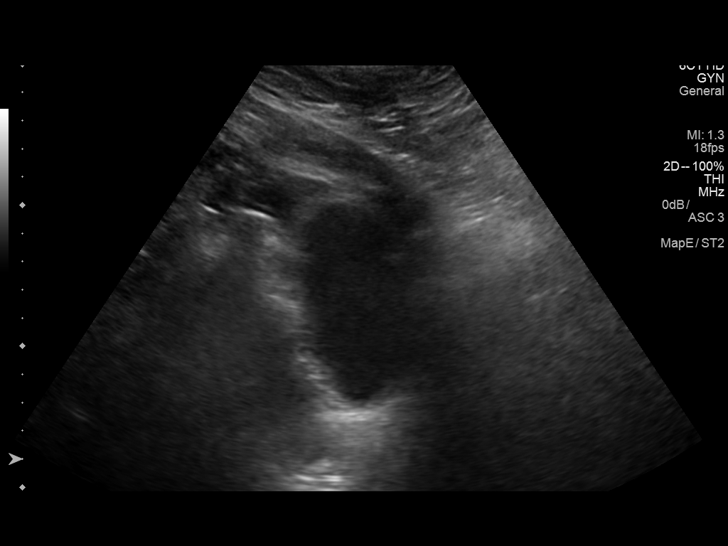
[im 4/41]
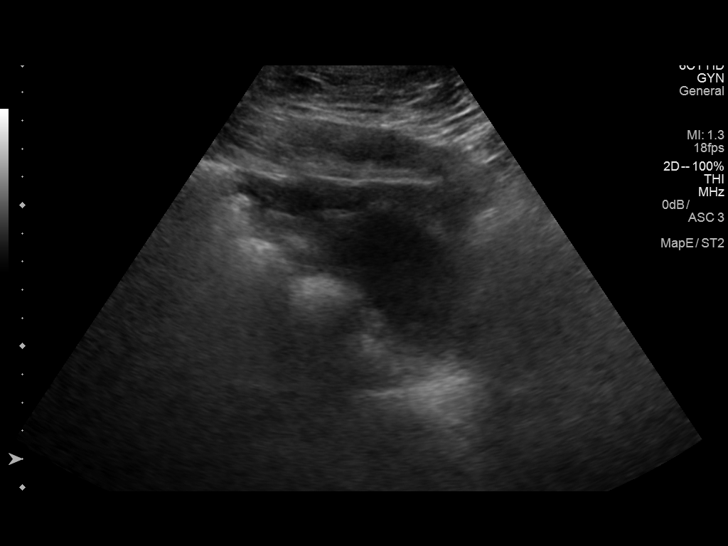
[im 7/41]
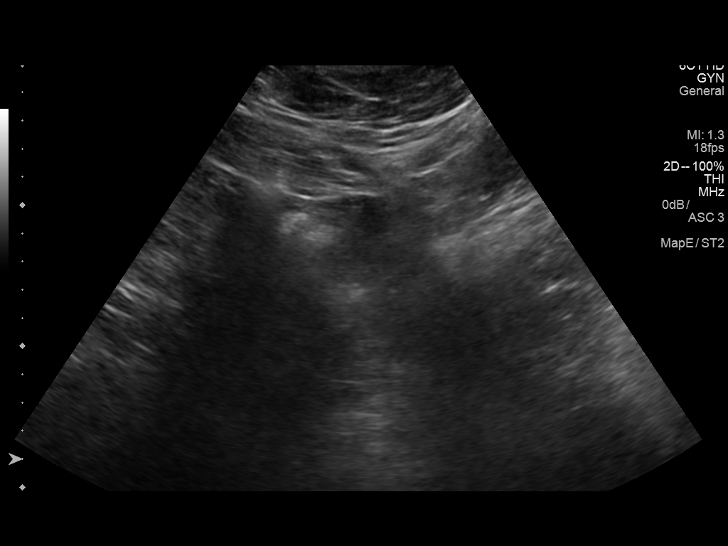
[im 11/41]
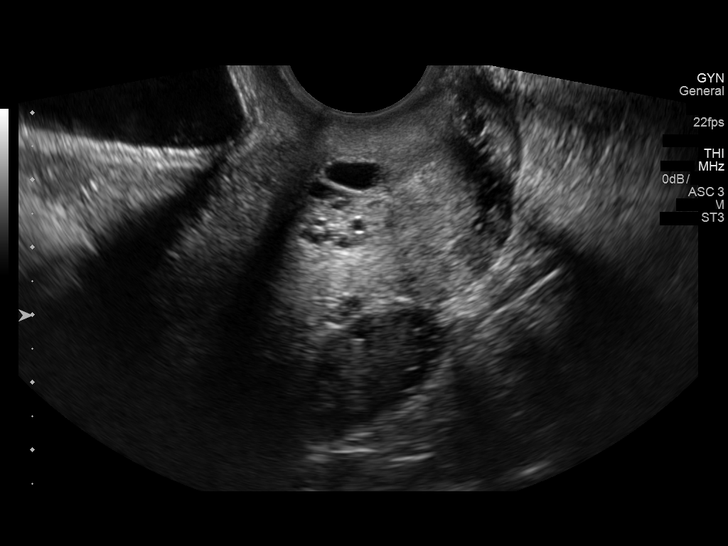
[im 14/41]
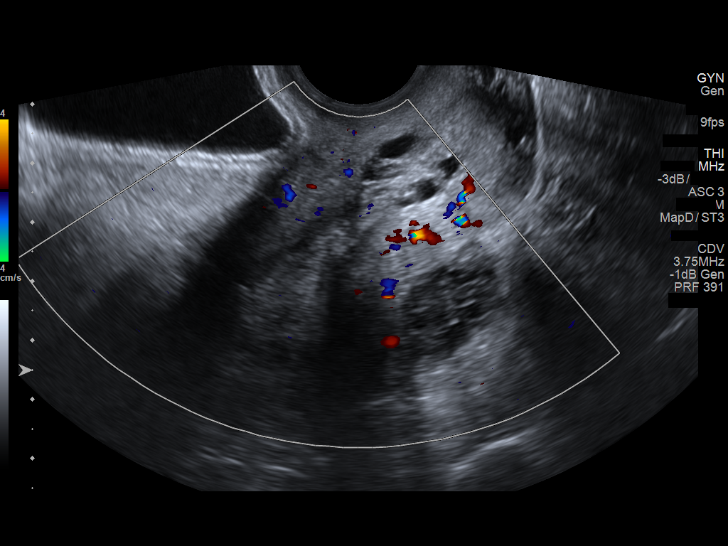
[im 17/41]
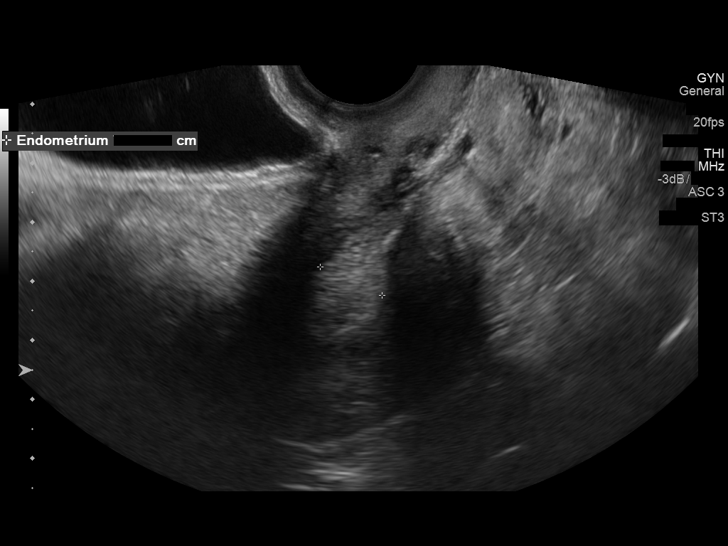
[im 21/41]
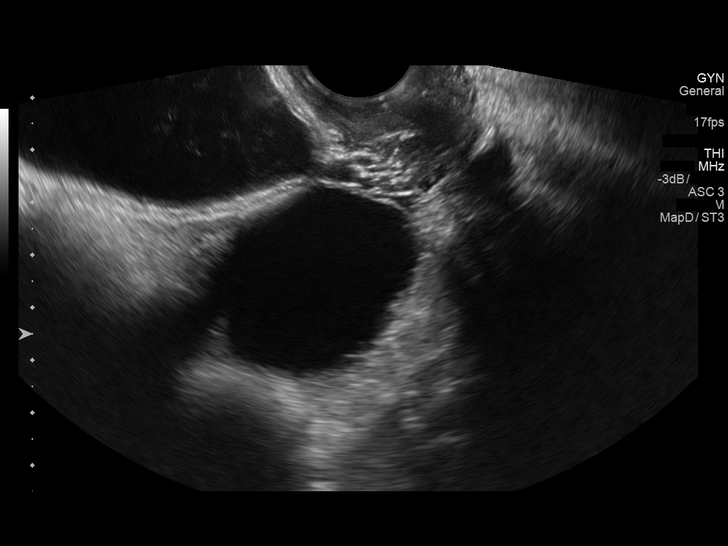
[im 24/41]
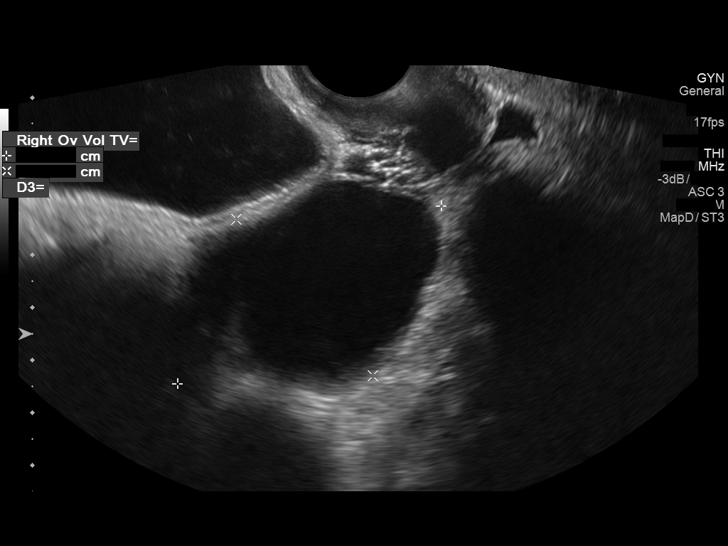
[im 27/41]
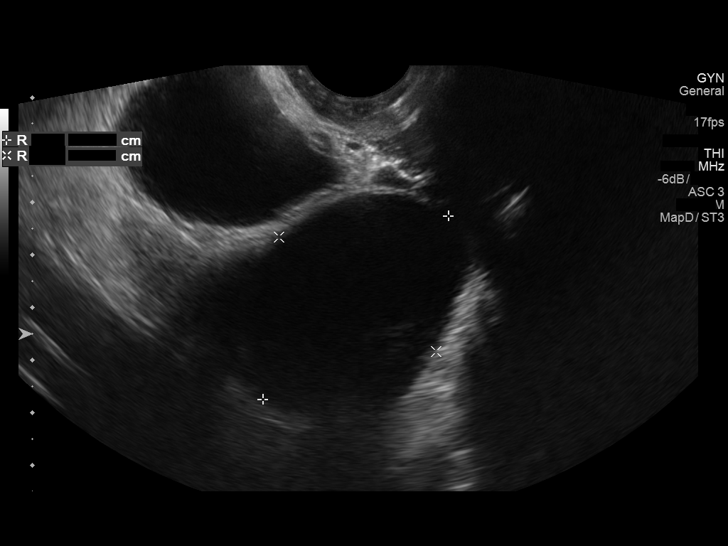
[im 31/41]
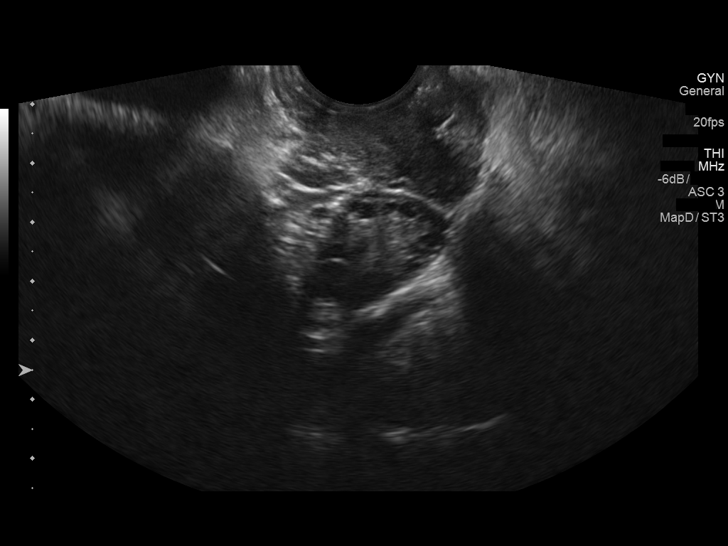
[im 34/41]
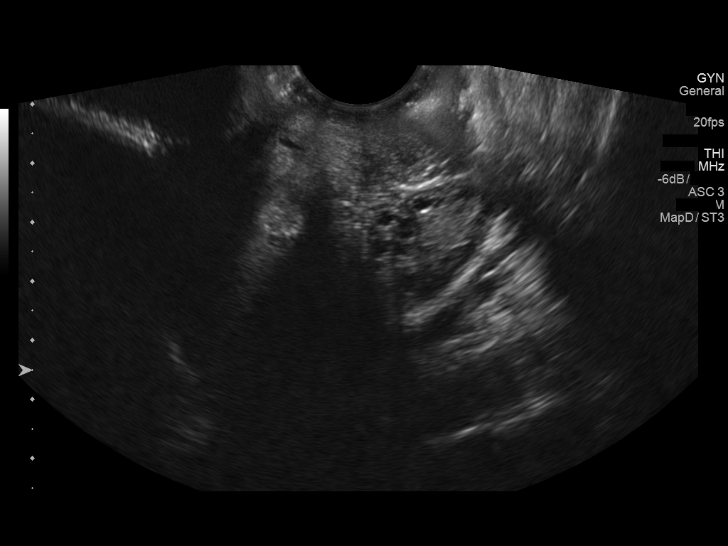
[im 37/41]
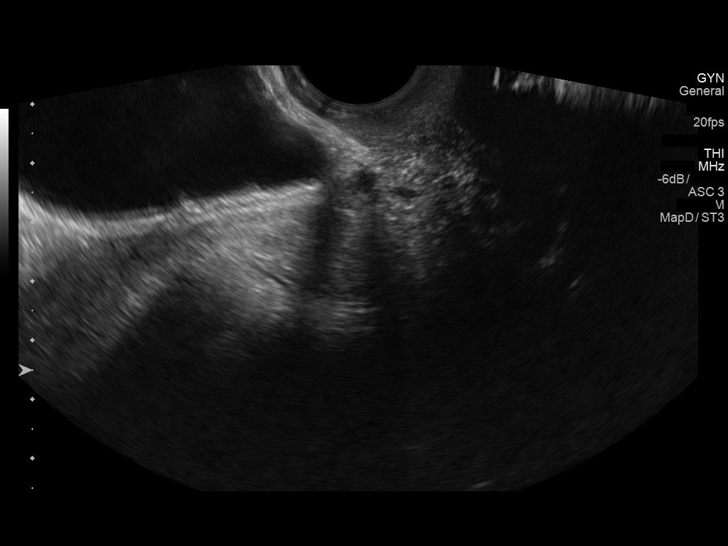
[im 41/41]
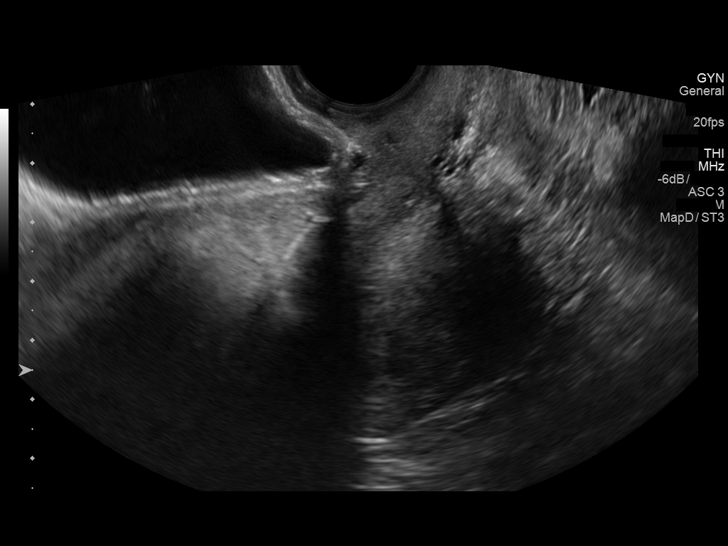

[13 of 25 positions shown; findings below may reference images not displayed]

FINDINGS: Examination is limited by patient body habitus and pain limiting her
ability to tolerate transvaginal transducer.

Uterus

Measurements: 7.2 x 3.6 x 4.8 cm. No fibroids or other mass
visualized. There are multiple nabothian cysts.

Endometrium

Thickness: 12 mm. No abnormal blood flow.. No focal abnormality
visualized.

Right ovary

Measurements: 6.1 x 4.0 x 4.8 cm. There is a septated right adnexal
cyst measuring 5.0 x 3.7 x 3.9 cm.

Left ovary

Measurements: 3.0 x 1.7 x 1.6 cm. Normal appearance/no adnexal mass.

Other findings

No abnormal free fluid.
IMPRESSION: 1. No abnormal vascularity within the endometrial cavity.
2. No focal uterine abnormality.
3. Large, simple right adnexal cyst.

## 2017-09-04 ENCOUNTER — Emergency Department (HOSPITAL_COMMUNITY): Payer: Medicaid Other

## 2017-09-04 ENCOUNTER — Encounter (HOSPITAL_COMMUNITY): Payer: Self-pay | Admitting: Emergency Medicine

## 2017-09-04 DIAGNOSIS — R0602 Shortness of breath: Secondary | ICD-10-CM | POA: Insufficient documentation

## 2017-09-04 DIAGNOSIS — R05 Cough: Secondary | ICD-10-CM | POA: Diagnosis not present

## 2017-09-04 DIAGNOSIS — J45909 Unspecified asthma, uncomplicated: Secondary | ICD-10-CM | POA: Insufficient documentation

## 2017-09-04 DIAGNOSIS — Z79899 Other long term (current) drug therapy: Secondary | ICD-10-CM | POA: Insufficient documentation

## 2017-09-04 DIAGNOSIS — N390 Urinary tract infection, site not specified: Secondary | ICD-10-CM | POA: Diagnosis not present

## 2017-09-04 LAB — URINALYSIS, ROUTINE W REFLEX MICROSCOPIC
BILIRUBIN URINE: NEGATIVE
Glucose, UA: NEGATIVE mg/dL
Hgb urine dipstick: NEGATIVE
KETONES UR: NEGATIVE mg/dL
NITRITE: NEGATIVE
PH: 7 (ref 5.0–8.0)
PROTEIN: NEGATIVE mg/dL
Specific Gravity, Urine: 1.02 (ref 1.005–1.030)

## 2017-09-04 LAB — CBC
HCT: 37.4 % (ref 36.0–46.0)
Hemoglobin: 11.9 g/dL — ABNORMAL LOW (ref 12.0–15.0)
MCH: 28.5 pg (ref 26.0–34.0)
MCHC: 31.8 g/dL (ref 30.0–36.0)
MCV: 89.7 fL (ref 78.0–100.0)
Platelets: 329 10*3/uL (ref 150–400)
RBC: 4.17 MIL/uL (ref 3.87–5.11)
RDW: 15.2 % (ref 11.5–15.5)
WBC: 6.8 10*3/uL (ref 4.0–10.5)

## 2017-09-04 LAB — COMPREHENSIVE METABOLIC PANEL
ALBUMIN: 3.5 g/dL (ref 3.5–5.0)
ALK PHOS: 73 U/L (ref 38–126)
ALT: 18 U/L (ref 14–54)
AST: 18 U/L (ref 15–41)
Anion gap: 6 (ref 5–15)
BUN: 9 mg/dL (ref 6–20)
CALCIUM: 9 mg/dL (ref 8.9–10.3)
CO2: 27 mmol/L (ref 22–32)
CREATININE: 0.7 mg/dL (ref 0.44–1.00)
Chloride: 107 mmol/L (ref 101–111)
GFR calc Af Amer: >60 mL/min (ref >60)
GFR calc non Af Amer: >60 mL/min (ref >60)
GLUCOSE: 88 mg/dL (ref 65–99)
Potassium: 3.8 mmol/L (ref 3.5–5.1)
SODIUM: 140 mmol/L (ref 135–145)
Total Bilirubin: 0.3 mg/dL (ref 0.3–1.2)
Total Protein: 7.3 g/dL (ref 6.5–8.1)

## 2017-09-04 LAB — I-STAT BETA HCG BLOOD, ED (NOT ORDERABLE): I-stat hCG, quantitative: <5 m[IU]/mL (ref <5)

## 2017-09-04 LAB — LIPASE, BLOOD: Lipase: 22 U/L (ref 11–51)

## 2017-09-04 IMAGING — CR DG CHEST 2V
2 series · 2 of 2 positions shown · non-contrast
Comparison: Radiograph [DATE]

CLINICAL DATA: Recent short of breath, cough.

EXAM:
CHEST  2 VIEW

[w chest pa]
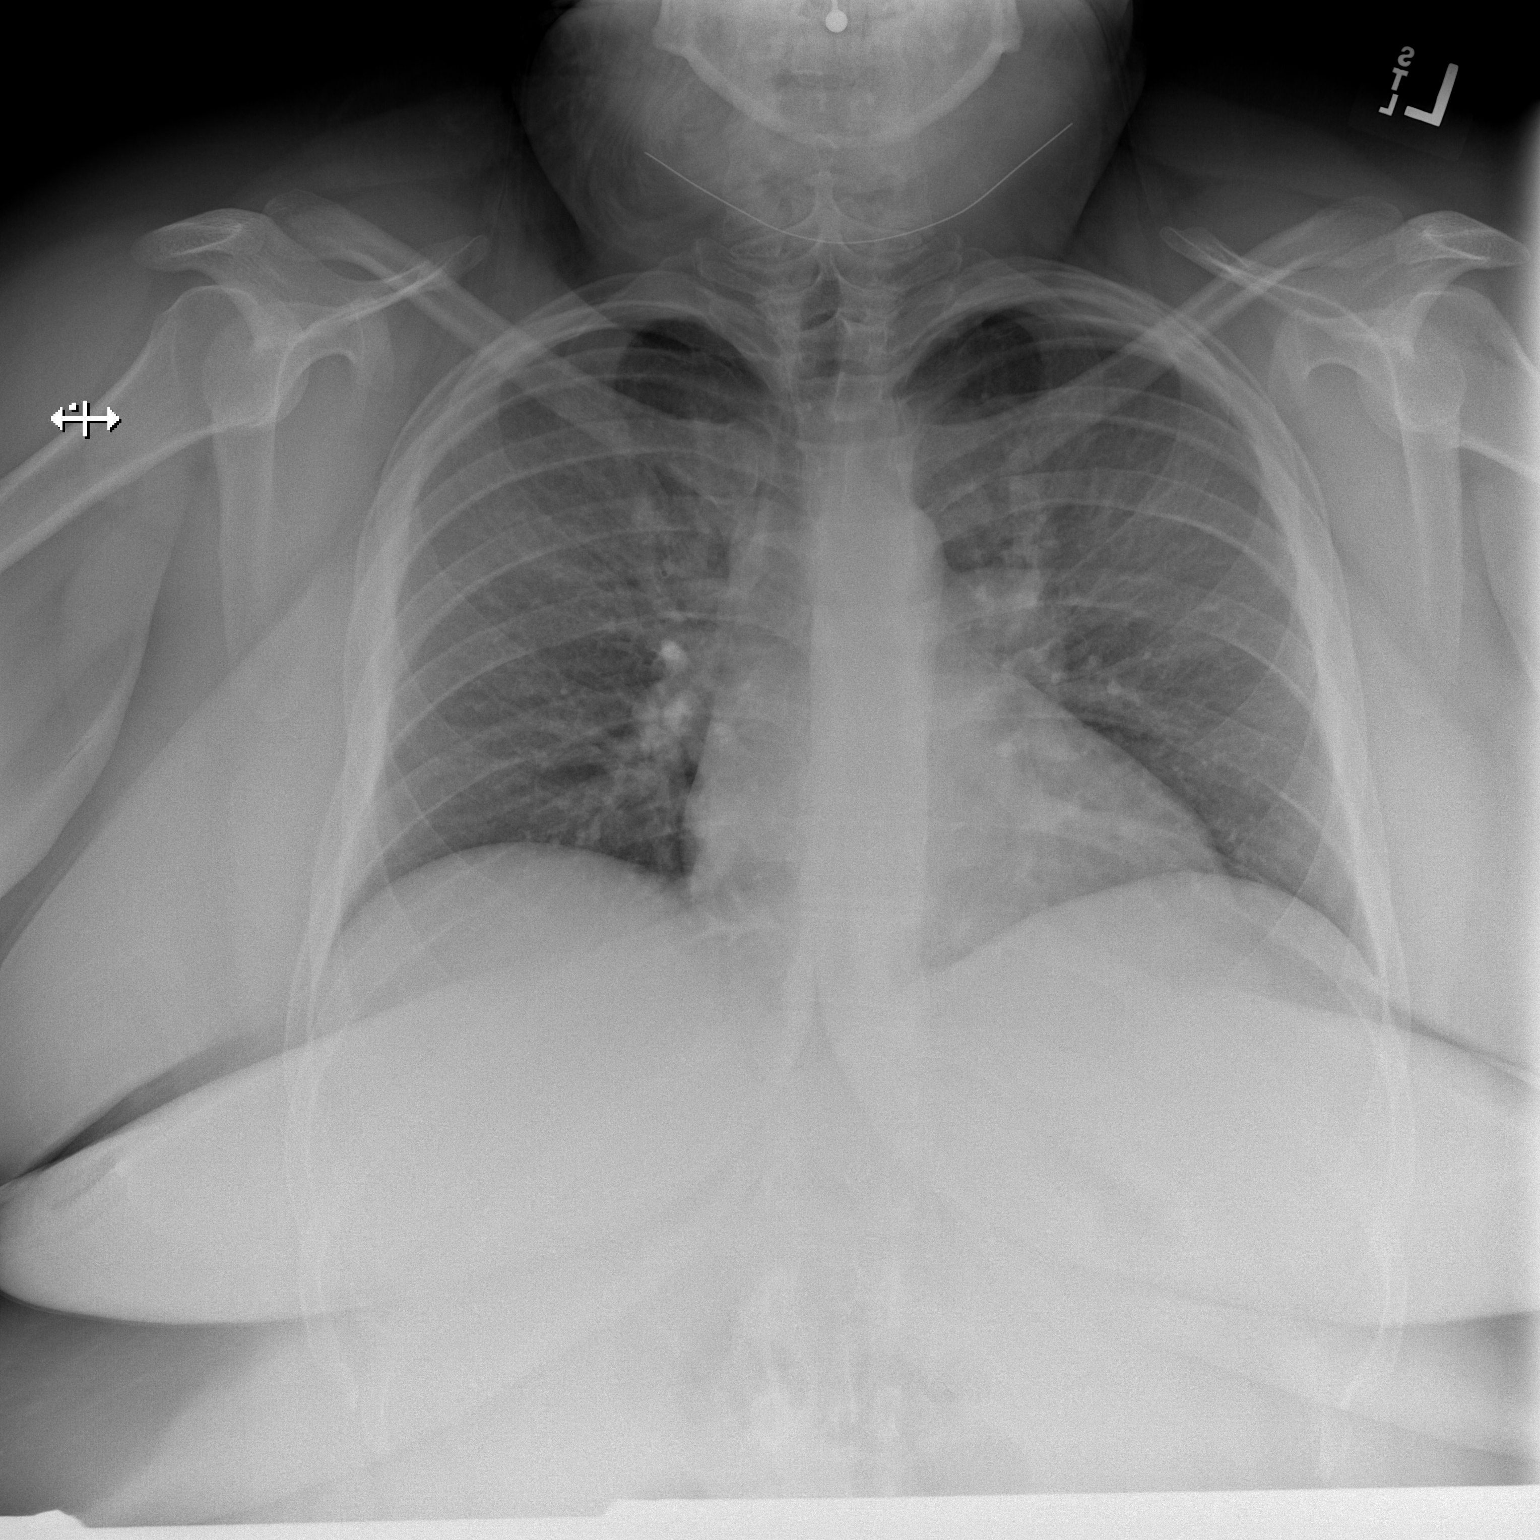

[w chest lat]
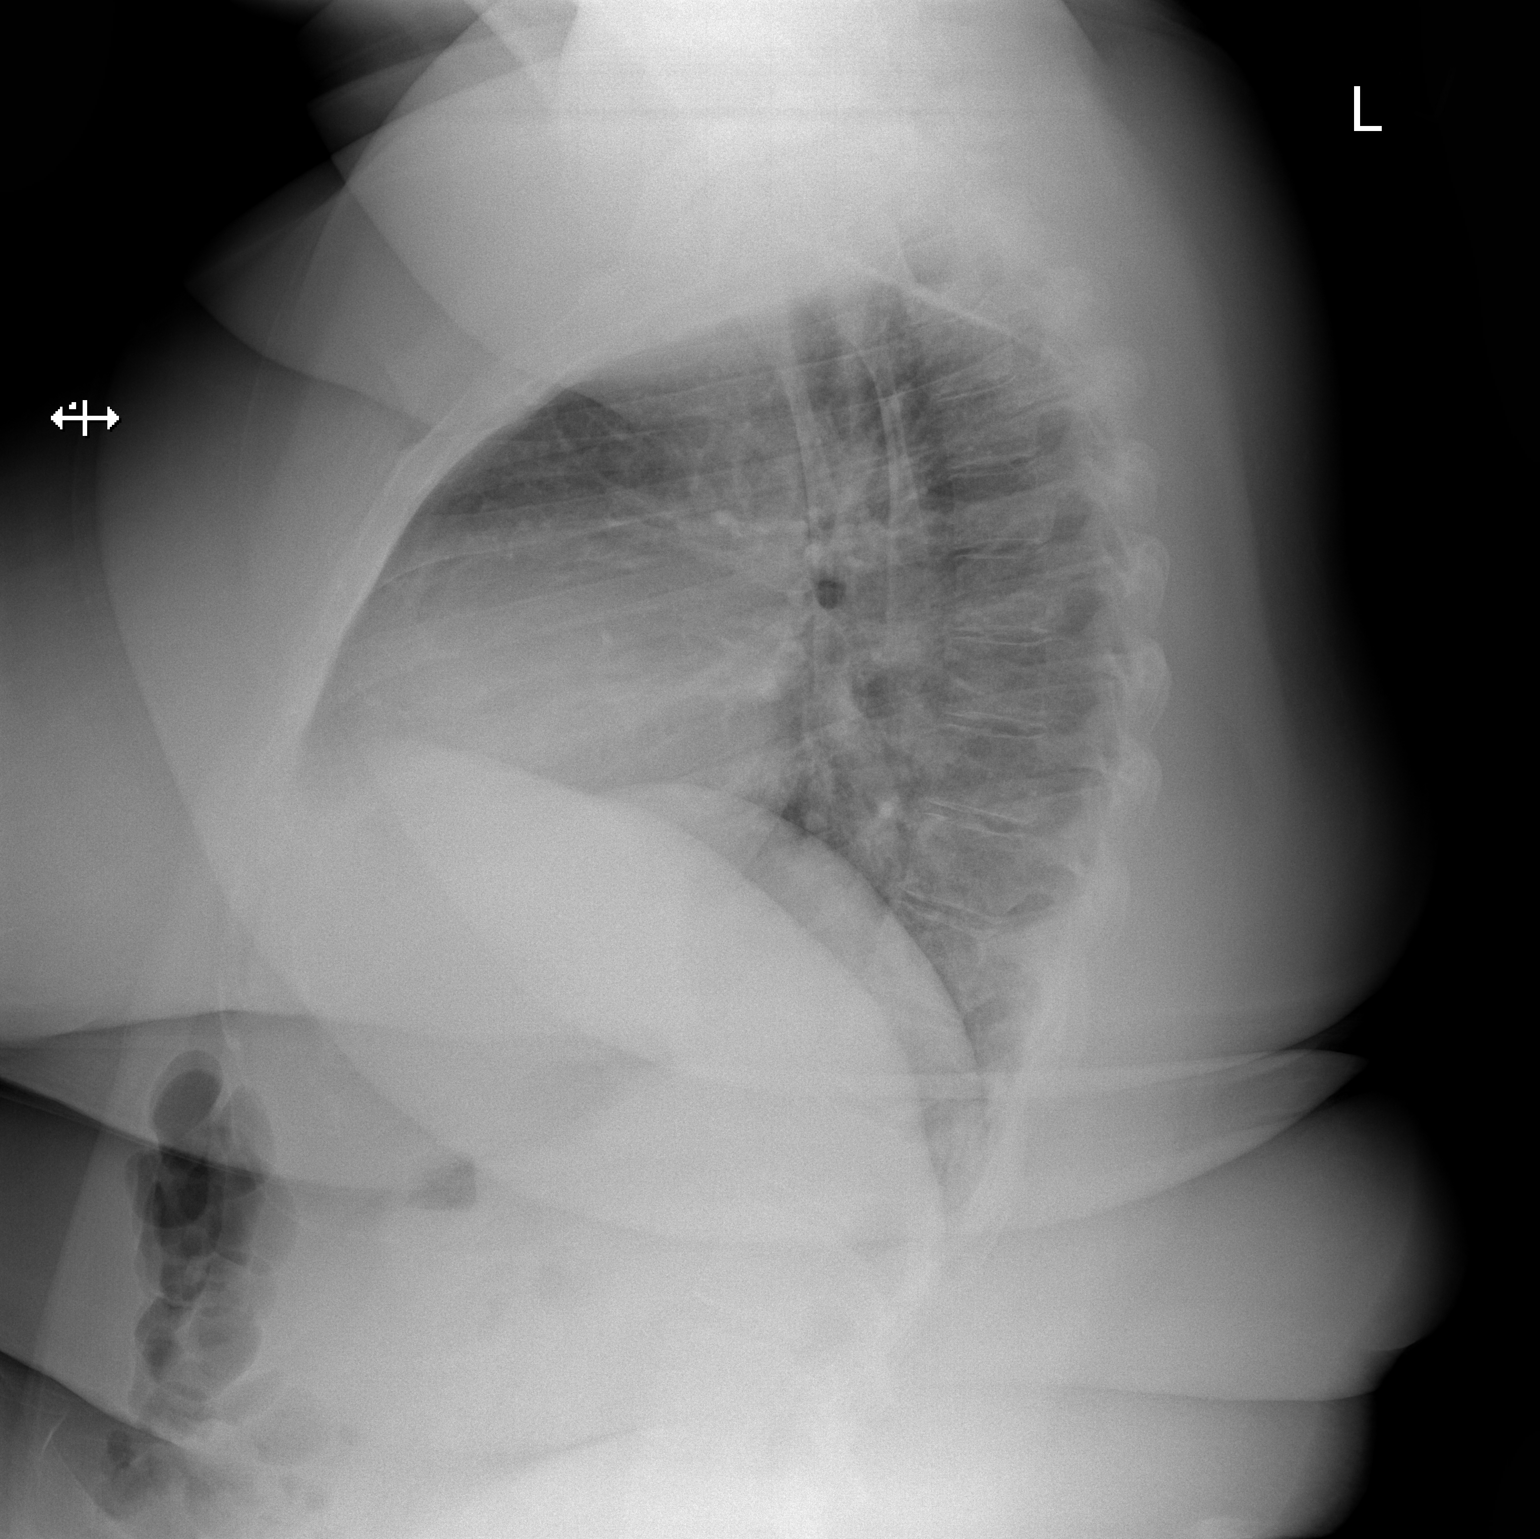

[2 of 2 positions shown; findings below may reference images not displayed]

FINDINGS: Normal mediastinum and cardiac silhouette. Normal pulmonary
vasculature. No evidence of effusion, infiltrate, or pneumothorax.
No acute bony abnormality.
IMPRESSION: No acute cardiopulmonary process.

## 2017-09-04 NOTE — ED Triage Notes (Signed)
Patient c/o productive cough, chills, and fever x4 days. Patient also c/o lower abdominal pain x2 months after having an abortion. Denies chest pain and SOB, V/D. Ambulatory.

## 2017-09-05 ENCOUNTER — Emergency Department (HOSPITAL_COMMUNITY)
Admission: EM | Admit: 2017-09-05 | Discharge: 2017-09-05 | Disposition: A | Discharge status: Home / Self Care | Payer: Medicaid Other | Attending: Emergency Medicine | Admitting: Emergency Medicine

## 2017-09-05 ENCOUNTER — Emergency Department (HOSPITAL_COMMUNITY): Payer: Medicaid Other

## 2017-09-05 ENCOUNTER — Other Ambulatory Visit: Payer: Self-pay

## 2017-09-05 DIAGNOSIS — R0602 Shortness of breath: Secondary | ICD-10-CM

## 2017-09-05 DIAGNOSIS — R1030 Lower abdominal pain, unspecified: Secondary | ICD-10-CM

## 2017-09-05 DIAGNOSIS — N390 Urinary tract infection, site not specified: Secondary | ICD-10-CM

## 2017-09-05 DIAGNOSIS — R059 Cough, unspecified: Secondary | ICD-10-CM

## 2017-09-05 DIAGNOSIS — R05 Cough: Secondary | ICD-10-CM

## 2017-09-05 LAB — WET PREP, GENITAL
Sperm: NONE SEEN
Trich, Wet Prep: NONE SEEN
Yeast Wet Prep HPF POC: NONE SEEN

## 2017-09-05 LAB — D-DIMER, QUANTITATIVE (NOT AT ARMC)

## 2017-09-05 LAB — RPR: RPR Ser Ql: NONREACTIVE

## 2017-09-05 LAB — HIV ANTIBODY (ROUTINE TESTING W REFLEX): HIV Screen 4th Generation wRfx: NONREACTIVE

## 2017-09-05 LAB — PROTIME-INR
INR: 1
Prothrombin Time: 13.1 seconds (ref 11.4–15.2)

## 2017-09-05 MED ORDER — CEFTRIAXONE SODIUM 1 G IJ SOLR
1.0000 g | Freq: Once | INTRAMUSCULAR | Status: AC
  Administered 2017-09-05: 1 g via INTRAMUSCULAR
  Filled 2017-09-05: qty 10

## 2017-09-05 MED ORDER — AZITHROMYCIN 250 MG PO TABS
1000.0000 mg | ORAL_TABLET | Freq: Once | ORAL | Status: AC
  Administered 2017-09-05: 1000 mg via ORAL
  Filled 2017-09-05: qty 4

## 2017-09-05 MED ORDER — DOXYCYCLINE HYCLATE 100 MG PO TABS
100.0000 mg | ORAL_TABLET | Freq: Once | ORAL | Status: AC
  Administered 2017-09-05: 100 mg via ORAL
  Filled 2017-09-05: qty 1

## 2017-09-05 MED ORDER — LIDOCAINE HCL (PF) 1 % IJ SOLN
INTRAMUSCULAR | Status: AC
  Filled 2017-09-05: qty 5

## 2017-09-05 MED ORDER — WARFARIN SODIUM 10 MG PO TABS: 15.0000 mg | ORAL_TABLET | Freq: Every day | ORAL | 0 refills | Status: DC

## 2017-09-05 MED ORDER — DOXYCYCLINE HYCLATE 100 MG PO CAPS: 100.0000 mg | ORAL_CAPSULE | Freq: Two times a day (BID) | ORAL | 0 refills | Status: DC

## 2017-09-05 MED ORDER — WARFARIN SODIUM 7.5 MG PO TABS
15.0000 mg | ORAL_TABLET | Freq: Once | ORAL | Status: AC
  Administered 2017-09-05: 15 mg via ORAL
  Filled 2017-09-05: qty 2

## 2017-09-05 MED ORDER — CEPHALEXIN 500 MG PO CAPS: 500.0000 mg | ORAL_CAPSULE | Freq: Two times a day (BID) | ORAL | 0 refills | Status: DC

## 2017-09-05 MED ORDER — CEPHALEXIN 500 MG PO CAPS
500.0000 mg | ORAL_CAPSULE | Freq: Once | ORAL | Status: AC
  Administered 2017-09-05: 500 mg via ORAL
  Filled 2017-09-05: qty 1

## 2017-09-05 NOTE — ED Provider Notes (Signed)
[facility]-EMERGENCY DEPT Provider Note   CSN: [id number] Arrival date & time: 09/04/17  1914     History   Chief Complaint Chief Complaint  Patient presents with  . Cough  . Abdominal Pain    HPI Felicia Bradley is a 26 y.o. female with history of diabetes, asthma, pulmonary embolism who presents with productive cough, fever, and chills for the past 4 days. She has also had lower abdominal pain and urinary frequency for the past 2 months after having an illegal abortion at someone's house at 8 weeks. Per medical record, there was history of the patient being 23 weeks in July, however patient reports this was her twin sister who sought prenatal care because she did not have insurance. She reports she spotted yesterday, however denies any other vaginal bleeding or discharge. She has not had sexual intercourse since her abortion. She had an abortion at around 8 weeks. Patient denies any chest pain, shortness of breath, vomiting, diarrhea. She has taken ibuprofen, Tylenol, and Percocet at home without relief of her symptoms. Patient currently anticoagulated on Coumadin for pulmonary embolism.   Cough  Pertinent negatives include no chest pain, no chills, no headaches, no sore throat and no shortness of breath.  Abdominal Pain   Associated symptoms include fever. Pertinent negatives include diarrhea, nausea, vomiting, dysuria and headaches.    Past Medical History:  Diagnosis Date  . Asthma   . Diabetes mellitus without complication (HCC)    type 2    There are no active problems to display for this patient.   Past Surgical History:  Procedure Laterality Date  . NO PAST SURGERIES      OB History    Gravida Para Term Preterm AB Living   1       1 0   SAB TAB Ectopic Multiple Live Births   1               Home Medications    Prior to Admission medications   Medication Sig Start Date End Date Taking? Authorizing Provider  acetaminophen (TYLENOL) 325 MG tablet Take 325 mg by  mouth every 6 (six) hours as needed for moderate pain.   Yes [provider]  albuterol (VENTOLIN HFA) 108 (90 Base) MCG/ACT inhaler Inhale 1 puff into the lungs every 6 (six) hours as needed for wheezing or shortness of breath.  11/10/14  Yes [provider]  ARIPiprazole (ABILIFY) 5 MG tablet Take 5 mg by mouth daily.   Yes [provider]  beclomethasone (QVAR) 80 MCG/ACT inhaler Inhale 1 puff into the lungs 2 (two) times daily. 11/10/14  Yes [provider]  EPINEPHrine 0.3 mg/0.3 mL IJ SOAJ injection Inject 0.3 mg into the muscle once.  11/10/14  Yes [provider]  ferrous sulfate 325 (65 FE) MG EC tablet Take 325 mg by mouth daily. 11/10/14  Yes [provider]  Fluticasone-Salmeterol (ADVAIR DISKUS) 500-50 MCG/DOSE AEPB Inhale 1 puff into the lungs 2 (two) times daily. 05/22/15  Yes [provider]  gabapentin (NEURONTIN) 300 MG capsule Take 300 mg by mouth 2 (two) times daily.   Yes [provider]  guaiFENesin (MUCINEX) 600 MG 12 hr tablet Take 600 mg by mouth 2 (two) times daily as needed for cough or to loosen phlegm.   Yes [provider]  losartan (COZAAR) 25 MG tablet Take 25 mg by mouth daily. 08/17/17  Yes [provider]  montelukast (SINGULAIR) 10 MG tablet Take 10 mg by  mouth daily. 10/14/15  Yes [provider]  traZODone (DESYREL) 150 MG tablet Take 300 mg by mouth at bedtime. 11/10/14  Yes [provider]  ziprasidone (GEODON) 20 MG capsule Take 20 mg by mouth daily.   Yes [provider]  cephALEXin (KEFLEX) 500 MG capsule Take 1 capsule (500 mg total) by mouth 2 (two) times daily. 09/05/17   Idalie Canto, Waylan Boga, PA-C  doxycycline (VIBRAMYCIN) 100 MG capsule Take 1 capsule (100 mg total) by mouth 2 (two) times daily. 09/05/17   Emi Holes, PA-C  warfarin (COUMADIN) 10 MG tablet Take 1.5 tablets (15 mg total) by mouth daily. 09/05/17   Emi Holes, PA-C     Family History Family History  Problem Relation Age of Onset  . Diabetes Mother   . Asthma Mother   . Asthma Maternal Aunt   . Diabetes Maternal Aunt     Social History Social History  Substance Use Topics  . Smoking status: Never Smoker  . Smokeless tobacco: Never Used  . Alcohol use No     Allergies   Contrast media [iodinated diagnostic agents]; Dilaudid [hydromorphone]; Tramadol; Apple; Banana; Ketorolac; Other; and Toradol [ketorolac tromethamine]   Review of Systems Review of Systems  Constitutional: Positive for fever. Negative for chills.  HENT: Negative for facial swelling and sore throat.   Respiratory: Positive for cough. Negative for shortness of breath.   Cardiovascular: Negative for chest pain.  Gastrointestinal: Positive for abdominal pain. Negative for diarrhea, nausea and vomiting.  Genitourinary: Positive for pelvic pain. Negative for dysuria.  Musculoskeletal: Positive for back pain (low, bilateral).  Skin: Negative for rash and wound.  Neurological: Negative for headaches.  Psychiatric/Behavioral: The patient is not nervous/anxious.      Physical Exam Updated Vital Signs BP 138/80 (BP Location: Right Arm)   Pulse (!) 102   Temp 98.1 F (36.7 C) (Oral)   Resp 18   Ht  (1.6 m)   Wt 127 kg (280 lb)   LMP 09/04/2017   SpO2 98%   BMI 49.60 kg/m   Physical Exam  Constitutional: She appears well-developed and well-nourished. No distress.  HENT:  Head: Normocephalic and atraumatic.  Mouth/Throat: Oropharynx is clear and moist. No oropharyngeal exudate.  Eyes: Pupils are equal, round, and reactive to light. Conjunctivae are normal. Right eye exhibits no discharge. Left eye exhibits no discharge. No scleral icterus.  Neck: Normal range of motion. Neck supple. No thyromegaly present.  Cardiovascular: Normal rate, regular rhythm, normal heart sounds and intact distal pulses.  Exam reveals no gallop and no friction rub.   No murmur  heard. Pulmonary/Chest: Effort normal and breath sounds normal. No stridor. No respiratory distress. She has no wheezes. She has no rales.  Abdominal: Soft. Bowel sounds are normal. She exhibits no distension. There is tenderness in the suprapubic area and left lower quadrant. There is no rebound, no guarding and no CVA tenderness.  Genitourinary: Pelvic exam was performed with patient supine. Uterus is tender. Right adnexum displays no tenderness. Left adnexum displays no tenderness. Vaginal discharge (foul odor) found.  Genitourinary Comments: Chaperone present  Musculoskeletal: She exhibits no edema.       Back:  Lymphadenopathy:    She has no cervical adenopathy.  Neurological: She is alert. Coordination normal.  Skin: Skin is warm and dry. No rash noted. She is not diaphoretic. No pallor.  Psychiatric: She has a normal mood and affect.  Nursing note and vitals reviewed.    ED Treatments /  Results  Labs (all labs ordered are listed, but only abnormal results are displayed) Labs Reviewed  WET PREP, GENITAL - Abnormal; Notable for the following:       Result Value   Clue Cells Wet Prep HPF POC PRESENT (*)    WBC, Wet Prep HPF POC RARE (*)    All other components within normal limits  CBC - Abnormal; Notable for the following:    Hemoglobin 11.9 (*)    All other components within normal limits  URINALYSIS, ROUTINE W REFLEX MICROSCOPIC - Abnormal; Notable for the following:    APPearance CLOUDY (*)    Leukocytes, UA LARGE (*)    Bacteria, UA RARE (*)    Squamous Epithelial / LPF 6-30 (*)    All other components within normal limits  LIPASE, BLOOD  COMPREHENSIVE METABOLIC PANEL  RPR  HIV ANTIBODY (ROUTINE TESTING)  PROTIME-INR  D-DIMER, QUANTITATIVE (NOT AT [facility])  I-STAT BETA HCG BLOOD, ED ([facility], [facility], [facility] ONLY)  I-STAT BETA HCG BLOOD, ED (NOT ORDERABLE)  GC/CHLAMYDIA PROBE AMP ([place]) NOT AT [facility]    EKG  EKG Interpretation None       Radiology Dg Chest 2  View  Result Date: 09/04/2017 CLINICAL DATA:  Recent short of breath, cough. EXAM: CHEST  2 VIEW COMPARISON:  Radiograph 07/24/2016 FINDINGS: Normal mediastinum and cardiac silhouette. Normal pulmonary vasculature. No evidence of effusion, infiltrate, or pneumothorax. No acute bony abnormality. IMPRESSION: No acute cardiopulmonary process. Electronically Signed   By: Genevive Bi M.D.   On: 09/04/2017 20:26   US Transvaginal Non-ob  Result Date: 09/05/2017 CLINICAL DATA:  Pelvic pain, right worse left. EXAM: TRANSABDOMINAL AND TRANSVAGINAL ULTRASOUND OF PELVIS TECHNIQUE: Both transabdominal and transvaginal ultrasound examinations of the pelvis were performed. Transabdominal technique was performed for global imaging of the pelvis including uterus, ovaries, adnexal regions, and pelvic cul-de-sac. It was necessary to proceed with endovaginal exam following the transabdominal exam to visualize the ovaries. COMPARISON:  None FINDINGS: Examination is limited by patient body habitus and pain limiting her ability to tolerate transvaginal transducer. Uterus Measurements: 7.2 x 3.6 x 4.8 cm. No fibroids or other mass visualized. There are multiple nabothian cysts. Endometrium Thickness: 12 mm. No abnormal blood flow. No focal abnormality visualized. Right ovary Measurements: 6.1 x 4.0 x 4.8 cm. There is a septated right adnexal cyst measuring 5.0 x 3.7 x 3.9 cm. Left ovary Measurements: 3.0 x 1.7 x 1.6 cm. Normal appearance/no adnexal mass. Other findings No abnormal free fluid. IMPRESSION: 1. No abnormal vascularity within the endometrial cavity. 2. No focal uterine abnormality. 3. Large, simple right adnexal cyst. Electronically Signed   By: Deatra Robinson M.D.   On: 09/05/2017 04:58   US Pelvis Complete  Result Date: 09/05/2017 CLINICAL DATA:  Pelvic pain, right worse left. EXAM: TRANSABDOMINAL AND TRANSVAGINAL ULTRASOUND OF PELVIS TECHNIQUE: Both transabdominal and transvaginal ultrasound examinations of  the pelvis were performed. Transabdominal technique was performed for global imaging of the pelvis including uterus, ovaries, adnexal regions, and pelvic cul-de-sac. It was necessary to proceed with endovaginal exam following the transabdominal exam to visualize the ovaries. COMPARISON:  None FINDINGS: Examination is limited by patient body habitus and pain limiting her ability to tolerate transvaginal transducer. Uterus Measurements: 7.2 x 3.6 x 4.8 cm. No fibroids or other mass visualized. There are multiple nabothian cysts. Endometrium Thickness: 12 mm. No abnormal blood flow. No focal abnormality visualized. Right ovary Measurements: 6.1 x 4.0 x 4.8 cm. There is a septated right adnexal cyst  measuring 5.0 x 3.7 x 3.9 cm. Left ovary Measurements: 3.0 x 1.7 x 1.6 cm. Normal appearance/no adnexal mass. Other findings No abnormal free fluid. IMPRESSION: 1. No abnormal vascularity within the endometrial cavity. 2. No focal uterine abnormality. 3. Large, simple right adnexal cyst. Electronically Signed   By: Deatra Robinson M.D.   On: 09/05/2017 04:58    Procedures Procedures (including critical care time)  Medications Ordered in ED Medications  warfarin (COUMADIN) tablet 15 mg (15 mg Oral Given 09/05/17 0623)  cefTRIAXone (ROCEPHIN) injection 1 g (1 g Intramuscular Given 09/05/17 0732)  doxycycline (VIBRA-TABS) tablet 100 mg (100 mg Oral Given 09/05/17 0730)  cephALEXin (KEFLEX) capsule 500 mg (500 mg Oral Given 09/05/17 0731)  azithromycin (ZITHROMAX) tablet 1,000 mg (1,000 mg Oral Given 09/05/17 0740)     Initial Impression / Assessment and Plan / ED Course  I have reviewed the triage vital signs and the nursing notes.  Pertinent labs & imaging results that were available during my care of the patient were reviewed by me and considered in my medical decision making (see chart for details).     Patient with symptoms concerning for infection after abortion. However, labs are unremarkable. UA shows  large leukocytes. Wet prep shows clue cells, rare wbc's. GC/Chlamydia sent. HIV, RPR nonreactive. CXR negative. D-dimer negative. Patient supposed to be anticoagulated with Coumadin, however INR 1. Patient given dose in the ED. She is advised to follow-up with her PCP for better control and optimization of Coumadin treatment. Patient given Rocephin, azithromycin, doxycycline, Keflex for treatment of suspected pelvic infection and UTI. Low suspicion for endometritis, as patient afebrile with stable labs, long duration of symptoms, and patient clinically well-appearing. No abnormal findings on pelvic ultrasound. Patient advised to be rechecked by the women's outpatient clinic in 2-3 days. Strict return precautions discussed. Patient understands and agrees with plan. Patient vitals stable throughout ED course and discharged in satisfactory condition. I discussed patient case with Dr. Nicanor Alcon who guided the patient's management and agrees with plan.  Final Clinical Impressions(s) / ED Diagnoses   Final diagnoses:  SOB (shortness of breath)  Lower urinary tract infectious disease  Lower abdominal pain  Cough    New Prescriptions Discharge Medication List as of 09/05/2017  7:00 AM    START taking these medications   Details  cephALEXin (KEFLEX) 500 MG capsule Take 1 capsule (500 mg total) by mouth 2 (two) times daily., Starting Sun 09/05/2017, Print    doxycycline (VIBRAMYCIN) 100 MG capsule Take 1 capsule (100 mg total) by mouth 2 (two) times daily., Starting Sun 09/05/2017, Print         Lorean Ekstrand, [place], PA-C 09/06/17 1610    Nicanor Alcon, April, MD 09/09/17 2303

## 2017-09-05 NOTE — ED Notes (Signed)
Pelvic is set up 

## 2017-09-05 NOTE — Discharge Instructions (Signed)
Begin taking Keflex and doxycycline. Make sure to finish all of your antibiotics. Please follow-up with an OB/GYN at the [facility] outpatient clinic or [facility] emergency Department within 48-72 hours. Please return to the emergency department if you develop any new or worsening symptoms. Please follow-up with your doctor or the Coumadin clinic for further management of your Coumadin levels.

## 2017-09-06 LAB — GC/CHLAMYDIA PROBE AMP (~~LOC~~) NOT AT ARMC
Chlamydia: NEGATIVE
Neisseria Gonorrhea: NEGATIVE

## 2017-09-08 ENCOUNTER — Encounter (HOSPITAL_COMMUNITY): Payer: Self-pay | Admitting: *Deleted

## 2017-09-08 ENCOUNTER — Inpatient Hospital Stay (HOSPITAL_COMMUNITY)
Admission: AC | Admit: 2017-09-08 | Discharge: 2017-09-08 | Disposition: A | Discharge status: Home / Self Care | Payer: Medicaid Other | Source: Ambulatory Visit | Attending: Family Medicine | Admitting: Family Medicine

## 2017-09-08 DIAGNOSIS — N939 Abnormal uterine and vaginal bleeding, unspecified: Secondary | ICD-10-CM | POA: Diagnosis not present

## 2017-09-08 DIAGNOSIS — T45515A Adverse effect of anticoagulants, initial encounter: Secondary | ICD-10-CM

## 2017-09-08 DIAGNOSIS — R58 Hemorrhage, not elsewhere classified: Secondary | ICD-10-CM

## 2017-09-08 HISTORY — DX: Essential (primary) hypertension: I10

## 2017-09-08 HISTORY — DX: Other pulmonary embolism without acute cor pulmonale: I26.99

## 2017-09-08 LAB — URINALYSIS, ROUTINE W REFLEX MICROSCOPIC

## 2017-09-08 LAB — RAPID URINE DRUG SCREEN, HOSP PERFORMED
AMPHETAMINES: NOT DETECTED
BARBITURATES: NOT DETECTED
Benzodiazepines: NOT DETECTED
COCAINE: NOT DETECTED
Opiates: NOT DETECTED
TETRAHYDROCANNABINOL: NOT DETECTED

## 2017-09-08 LAB — CBC WITH DIFFERENTIAL/PLATELET
BASOS ABS: 0 10*3/uL (ref 0.0–0.1)
BASOS PCT: 0 %
EOS ABS: 0.1 10*3/uL (ref 0.0–0.7)
Eosinophils Relative: 2 %
HEMATOCRIT: 36.6 % (ref 36.0–46.0)
Hemoglobin: 11.5 g/dL — ABNORMAL LOW (ref 12.0–15.0)
Lymphocytes Relative: 50 %
Lymphs Abs: 3.3 10*3/uL (ref 0.7–4.0)
MCH: 28.4 pg (ref 26.0–34.0)
MCHC: 31.4 g/dL (ref 30.0–36.0)
MCV: 90.4 fL (ref 78.0–100.0)
MONO ABS: 0.1 10*3/uL (ref 0.1–1.0)
Monocytes Relative: 2 %
Neutro Abs: 3 10*3/uL (ref 1.7–7.7)
Neutrophils Relative %: 46 %
PLATELETS: 316 10*3/uL (ref 150–400)
RBC: 4.05 MIL/uL (ref 3.87–5.11)
RDW: 15.3 % (ref 11.5–15.5)
WBC: 6.5 10*3/uL (ref 4.0–10.5)

## 2017-09-08 LAB — COMPREHENSIVE METABOLIC PANEL
ALBUMIN: 3.4 g/dL — AB (ref 3.5–5.0)
ALT: 18 U/L (ref 14–54)
ANION GAP: 6 (ref 5–15)
AST: 18 U/L (ref 15–41)
Alkaline Phosphatase: 64 U/L (ref 38–126)
BILIRUBIN TOTAL: 0.5 mg/dL (ref 0.3–1.2)
BUN: 11 mg/dL (ref 6–20)
CHLORIDE: 109 mmol/L (ref 101–111)
CO2: 27 mmol/L (ref 22–32)
Calcium: 9 mg/dL (ref 8.9–10.3)
Creatinine, Ser: 0.65 mg/dL (ref 0.44–1.00)
GFR calc Af Amer: >60 mL/min (ref >60)
GFR calc non Af Amer: >60 mL/min (ref >60)
GLUCOSE: 93 mg/dL (ref 65–99)
POTASSIUM: 3.8 mmol/L (ref 3.5–5.1)
SODIUM: 142 mmol/L (ref 135–145)
Total Protein: 7 g/dL (ref 6.5–8.1)

## 2017-09-08 LAB — URINALYSIS, MICROSCOPIC (REFLEX)
BACTERIA UA: NONE SEEN
WBC UA: NONE SEEN WBC/hpf (ref 0–5)

## 2017-09-08 LAB — HCG, QUANTITATIVE, PREGNANCY: hCG, Beta Chain, Quant, S: <1 m[IU]/mL (ref <5)

## 2017-09-08 LAB — POCT PREGNANCY, URINE: PREG TEST UR: NEGATIVE
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
## 2017-09-08 NOTE — Discharge Instructions (Signed)
In late 2019, the [facility] will be moving to the [facility] campus. At that time, the MAU will no longer serve non-pregnant patients. We encourage you to establish care with a provider before that time, so that you can be seen with any GYN concerns, like vaginal discharge, urinary tract infection, etc.. in a timely manner. In order to make the office visit more convenient, the Center for [facility] at [facility] will be offering evening hours from 4pm-8pm on Mondays starting 08/23/17. There will be same-day appointments, walk-in appointments and scheduled appointments available during this time.    Center for [facility] @ [facility] [phone]  For urgent needs, [facility] is also available for management of urgent GYN complaints such as vaginal discharge.   [employer] extends eligibility for family planning services to reduce unintended pregnancies and improve the well-being of children and families.   Eligible individuals whose income is at or below 195% of the federal poverty level and who are:  - [place] citizens, documented immigrants or qualified aliens;  - Residents of [place];  - Not incarcerated; and  - Not pregnant.   [employer] Family Planning Contact Information:  Medical Assistance Clinical Section Phone: [phone] Email: [email or web address]     Abnormal Uterine Bleeding Abnormal uterine bleeding means bleeding more than usual from your uterus. It can include:  Bleeding between periods.  Bleeding after sex.  Bleeding that is heavier than normal.  Periods that last longer than usual.  Bleeding after you have stopped having your period (menopause).  There are many problems that may cause this. You should see a doctor for any kind of bleeding that is not normal. Treatment depends on the cause of the bleeding. Follow these instructions at home:  Watch your condition for any changes.  Do  not use tampons, douche, or have sex, if your doctor tells you not to.  Change your pads often.  Get regular well-woman exams. Make sure they include a pelvic exam and cervical cancer screening.  Keep all follow-up visits as told by your doctor. This is important. Contact a doctor if:  The bleeding lasts more than one week.  You feel dizzy at times.  You feel like you are going to throw up (nauseous).  You throw up. Get help right away if:  You pass out.  You have to change pads every hour.  You have belly (abdominal) pain.  You have a fever.  You get sweaty.  You get weak.  You passing large blood clots from your vagina. Summary  Abnormal uterine bleeding means bleeding more than usual from your uterus.  There are many problems that may cause this. You should see a doctor for any kind of bleeding that is not normal.  Treatment depends on the cause of the bleeding. This information is not intended to replace advice given to you by your health care provider. Make sure you discuss any questions you have with your health care provider. Document Released: 09/27/2009 Document Revised: 11/24/2016 Document Reviewed: 11/24/2016 Elsevier Interactive Patient Education  2017 [employer].

## 2017-09-22 ENCOUNTER — Encounter: Payer: Medicaid Other | Admitting: Obstetrics and Gynecology

## 2018-01-21 ENCOUNTER — Inpatient Hospital Stay (HOSPITAL_COMMUNITY)
Admission: AD | Admit: 2018-01-21 | Discharge: 2018-01-21 | Discharge status: Against Medical Advice | Payer: Medicaid Other | Source: Ambulatory Visit | Attending: Obstetrics & Gynecology | Admitting: Obstetrics & Gynecology
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
## 2018-08-02 ENCOUNTER — Other Ambulatory Visit: Payer: Self-pay

## 2018-08-02 ENCOUNTER — Emergency Department (HOSPITAL_COMMUNITY)
Admission: EM | Admit: 2018-08-02 | Discharge: 2018-08-02 | Discharge status: Against Medical Advice | Payer: Medicaid Other | Attending: Emergency Medicine | Admitting: Emergency Medicine

## 2018-08-02 ENCOUNTER — Encounter (HOSPITAL_COMMUNITY): Payer: Self-pay | Admitting: Emergency Medicine

## 2018-08-02 DIAGNOSIS — M545 Low back pain: Secondary | ICD-10-CM | POA: Insufficient documentation

## 2018-08-02 LAB — URINALYSIS, ROUTINE W REFLEX MICROSCOPIC
Bilirubin Urine: NEGATIVE
GLUCOSE, UA: NEGATIVE mg/dL
HGB URINE DIPSTICK: NEGATIVE
Ketones, ur: 5 mg/dL — AB
NITRITE: NEGATIVE
PH: 5 (ref 5.0–8.0)
Protein, ur: NEGATIVE mg/dL
SPECIFIC GRAVITY, URINE: 1.027 (ref 1.005–1.030)

## 2018-08-02 LAB — PREGNANCY, URINE: PREG TEST UR: NEGATIVE
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
## 2018-08-02 NOTE — ED Notes (Signed)
Called to place in room. No answer. 

## 2018-08-03 NOTE — ED Notes (Signed)
Follow up call made  Pt was seen at another hospital  08/03/18  1214  s Theophil Thivierge rn

## 2018-12-02 ENCOUNTER — Emergency Department (HOSPITAL_COMMUNITY): Payer: Medicaid Other

## 2018-12-02 ENCOUNTER — Emergency Department (HOSPITAL_COMMUNITY)
Admission: EM | Admit: 2018-12-02 | Discharge: 2018-12-02 | Disposition: A | Discharge status: Home / Self Care | Payer: Medicaid Other | Attending: Emergency Medicine | Admitting: Emergency Medicine

## 2018-12-02 ENCOUNTER — Other Ambulatory Visit: Payer: Self-pay

## 2018-12-02 ENCOUNTER — Encounter (HOSPITAL_COMMUNITY): Payer: Self-pay

## 2018-12-02 DIAGNOSIS — I10 Essential (primary) hypertension: Secondary | ICD-10-CM | POA: Diagnosis not present

## 2018-12-02 DIAGNOSIS — Z79899 Other long term (current) drug therapy: Secondary | ICD-10-CM | POA: Diagnosis not present

## 2018-12-02 DIAGNOSIS — J45909 Unspecified asthma, uncomplicated: Secondary | ICD-10-CM | POA: Insufficient documentation

## 2018-12-02 DIAGNOSIS — R42 Dizziness and giddiness: Secondary | ICD-10-CM | POA: Diagnosis not present

## 2018-12-02 DIAGNOSIS — E119 Type 2 diabetes mellitus without complications: Secondary | ICD-10-CM | POA: Insufficient documentation

## 2018-12-02 LAB — CBC
HCT: 37.2 % (ref 36.0–46.0)
Hemoglobin: 10.9 g/dL — ABNORMAL LOW (ref 12.0–15.0)
MCH: 26.4 pg (ref 26.0–34.0)
MCHC: 29.3 g/dL — ABNORMAL LOW (ref 30.0–36.0)
MCV: 90.1 fL (ref 80.0–100.0)
Platelets: 386 10*3/uL (ref 150–400)
RBC: 4.13 MIL/uL (ref 3.87–5.11)
RDW: 16.1 % — ABNORMAL HIGH (ref 11.5–15.5)
WBC: 8.7 10*3/uL (ref 4.0–10.5)
nRBC: 0 % (ref 0.0–0.2)

## 2018-12-02 LAB — I-STAT BETA HCG BLOOD, ED (MC, WL, AP ONLY): I-stat hCG, quantitative: <5 m[IU]/mL (ref <5)

## 2018-12-02 LAB — BASIC METABOLIC PANEL
Anion gap: 12 (ref 5–15)
BUN: 9 mg/dL (ref 6–20)
CO2: 26 mmol/L (ref 22–32)
Calcium: 8.9 mg/dL (ref 8.9–10.3)
Chloride: 105 mmol/L (ref 98–111)
Creatinine, Ser: 0.73 mg/dL (ref 0.44–1.00)
GFR calc Af Amer: >60 mL/min (ref >60)
GFR calc non Af Amer: >60 mL/min (ref >60)
Glucose, Bld: 123 mg/dL — ABNORMAL HIGH (ref 70–99)
Potassium: 3.4 mmol/L — ABNORMAL LOW (ref 3.5–5.1)
Sodium: 143 mmol/L (ref 135–145)

## 2018-12-02 LAB — I-STAT TROPONIN, ED: Troponin i, poc: 0 ng/mL (ref 0.00–0.08)

## 2018-12-02 LAB — D-DIMER, QUANTITATIVE: D-Dimer, Quant: 0.34 ug/mL-FEU (ref 0.00–0.50)

## 2018-12-02 LAB — CBG MONITORING, ED: Glucose-Capillary: 125 mg/dL — ABNORMAL HIGH (ref 70–99)

## 2018-12-02 IMAGING — CR DG CHEST 2V
2 series · 2 of 2 positions shown · non-contrast
Comparison: [DATE].

CLINICAL DATA: Left-sided chest pain and dizziness.

EXAM:
CHEST - 2 VIEW

[w chest pa]
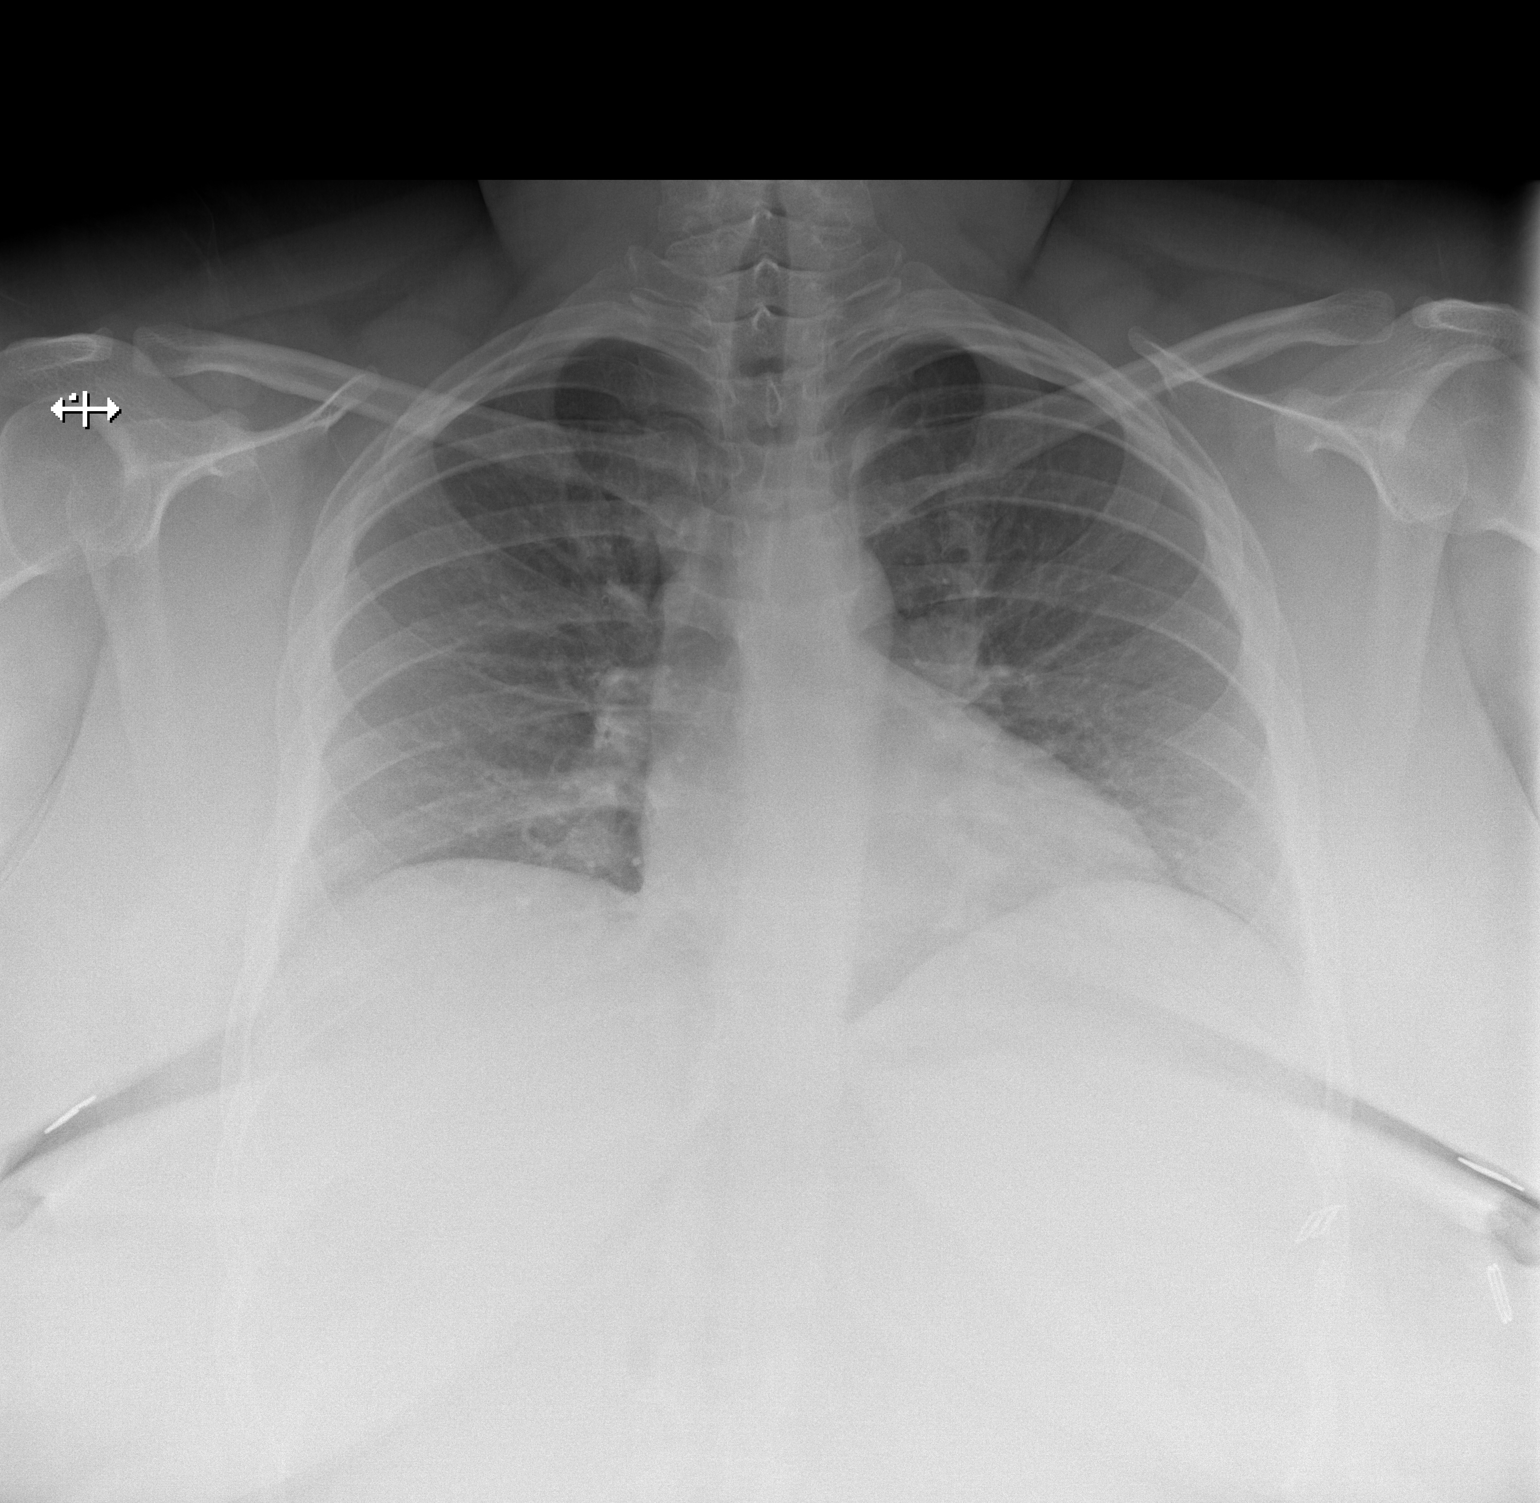

[w chest lat]
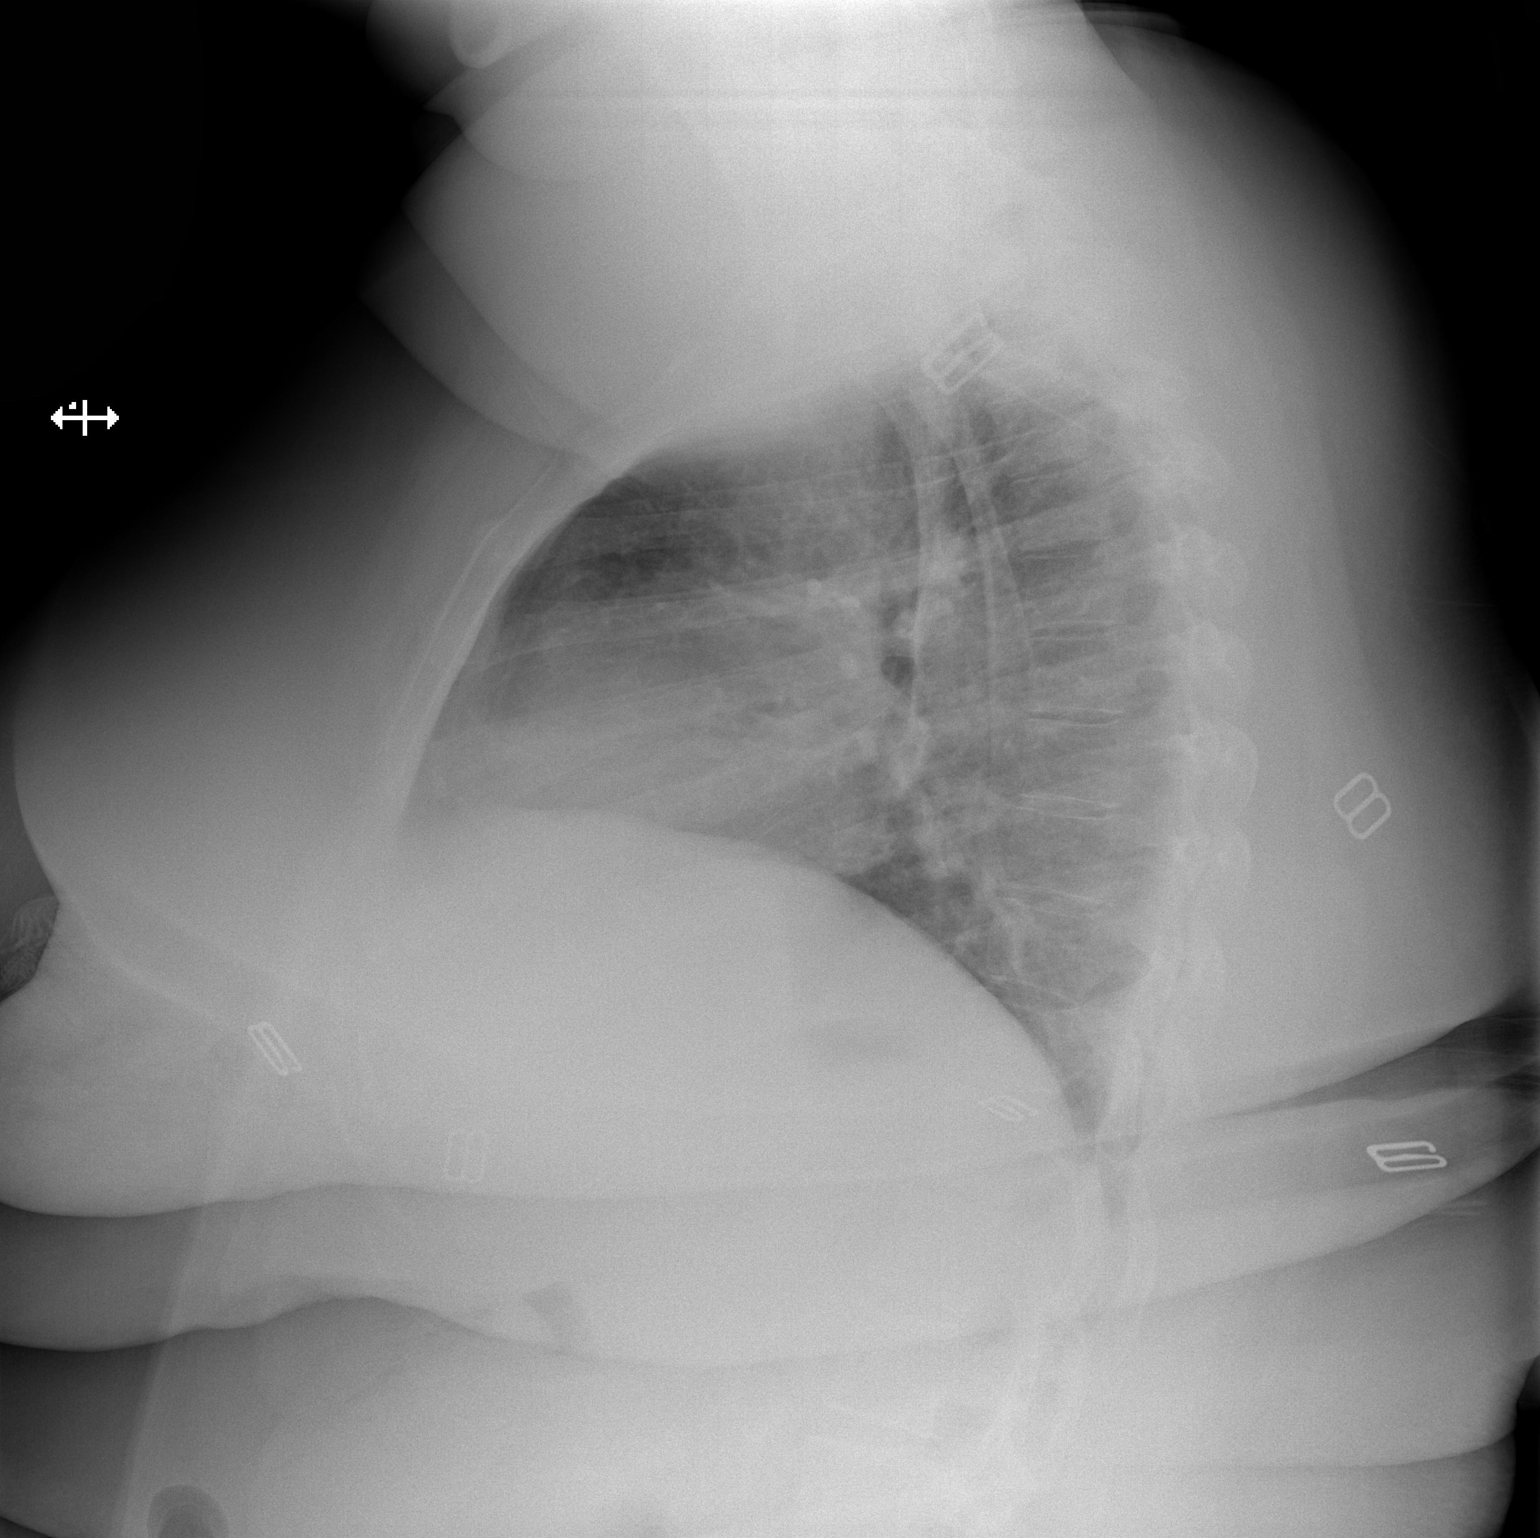

[2 of 2 positions shown; findings below may reference images not displayed]

FINDINGS: Low volume film. The lungs are clear without focal pneumonia, edema,
pneumothorax or pleural effusion. The cardiopericardial silhouette
is within normal limits for size. The visualized bony structures of
the thorax are intact.
IMPRESSION: No active cardiopulmonary disease.

## 2018-12-02 MED ORDER — SODIUM CHLORIDE 0.9 % IV BOLUS
500.0000 mL | Freq: Once | INTRAVENOUS | Status: AC
  Administered 2018-12-02: 500 mL via INTRAVENOUS

## 2018-12-02 NOTE — Discharge Instructions (Addendum)
Drink plenty of fluids and follow-up with your primary care doctor next 1 to 2 weeks if symptoms do not improve

## 2018-12-02 NOTE — ED Provider Notes (Signed)
[facility]-EMERGENCY DEPT Provider Note   CSN: [id number] Arrival date & time: 12/02/18  2029     History   Chief Complaint Chief Complaint  Patient presents with  . Dizziness  . Chest Pain    HPI Felicia Bradley is a 27 y.o. female.  Patient states that she has been having periodic chest discomfort and then some dizziness.  Symptoms only last for a few seconds when she has some.  Patient is not having any discomfort now  The history is provided by the patient. No language interpreter was used.  Illness  This is a new problem. The current episode started 12 to 24 hours ago. The problem occurs rarely. The problem has been resolved. Pertinent negatives include no chest pain, no abdominal pain and no headaches. Nothing aggravates the symptoms. Nothing relieves the symptoms. She has tried nothing for the symptoms. The treatment provided no relief.    Past Medical History:  Diagnosis Date  . Asthma   . Diabetes mellitus without complication (HCC)    type 2  . Hypertension   . Pulmonary embolism (HCC)     There are no active problems to display for this patient.   Past Surgical History:  Procedure Laterality Date  . CESAREAN SECTION    . NO PAST SURGERIES       OB History    Gravida  2   Para      Term      Preterm      AB  2   Living  0     SAB  1   TAB  1   Ectopic      Multiple      Live Births               Home Medications    Prior to Admission medications   Medication Sig Start Date End Date Taking? Authorizing Provider  acetaminophen (TYLENOL) 325 MG tablet Take 325 mg by mouth every 6 (six) hours as needed for moderate pain.   Yes [provider]  albuterol (VENTOLIN HFA) 108 (90 Base) MCG/ACT inhaler Inhale 1 puff into the lungs every 6 (six) hours as needed for wheezing or shortness of breath.  11/10/14  Yes [provider]  enoxaparin (LOVENOX) 150 MG/ML injection Inject 135 mg into the skin  every 12 (twelve) hours.  07/12/18  Yes [provider]  EPINEPHrine 0.3 mg/0.3 mL IJ SOAJ injection Inject 0.3 mg into the muscle once.  11/10/14  Yes [provider]  ferrous sulfate 325 (65 FE) MG EC tablet Take 325 mg by mouth daily. 11/10/14  Yes [provider]  Multiple Vitamins-Minerals (MULTIVITAMIN ADULT PO) Take 1 tablet by mouth daily.   Yes [provider]    Family History Family History  Problem Relation Age of Onset  . Diabetes Mother   . Asthma Mother   . Asthma Maternal Aunt   . Diabetes Maternal Aunt     Social History Social History   Tobacco Use  . Smoking status: Never Smoker  . Smokeless tobacco: Never Used  Substance Use Topics  . Alcohol use: No  . Drug use: No     Allergies   Contrast media [iodinated diagnostic agents]; Dilaudid [hydromorphone]; Tramadol; Apple; Banana; Ketorolac; Other; Solu-medrol [methylprednisolone]; and Toradol [ketorolac tromethamine]   Review of Systems Review of Systems  Constitutional: Negative for appetite change and fatigue.  HENT: Negative for congestion, ear discharge and sinus pressure.  Eyes: Negative for discharge.  Respiratory: Negative for cough.   Cardiovascular: Negative for chest pain.  Gastrointestinal: Negative for abdominal pain and diarrhea.  Genitourinary: Negative for frequency and hematuria.  Musculoskeletal: Negative for back pain.  Skin: Negative for rash.  Neurological: Positive for dizziness. Negative for seizures and headaches.  Psychiatric/Behavioral: Negative for hallucinations.     Physical Exam Updated Vital Signs BP (!) 134/105 (BP Location: Left Arm)   Pulse (!) 123   Temp 98 F (36.7 C) (Oral)   Resp 18   LMP 10/02/2018   SpO2 100%   Physical Exam Constitutional:      Appearance: She is well-developed.  HENT:     Head: Normocephalic.  Eyes:     General: No scleral icterus.    Conjunctiva/sclera: Conjunctivae normal.  Neck:      Musculoskeletal: Neck supple.     Thyroid: No thyromegaly.  Cardiovascular:     Rate and Rhythm: Normal rate and regular rhythm.     Heart sounds: No murmur. No friction rub. No gallop.   Pulmonary:     Breath sounds: No stridor. No wheezing or rales.  Chest:     Chest wall: No tenderness.  Abdominal:     General: There is no distension.     Tenderness: There is no abdominal tenderness. There is no rebound.  Musculoskeletal: Normal range of motion.  Lymphadenopathy:     Cervical: No cervical adenopathy.  Skin:    Findings: No erythema or rash.  Neurological:     Mental Status: She is oriented to person, place, and time.     Motor: No abnormal muscle tone.     Coordination: Coordination normal.  Psychiatric:        Behavior: Behavior normal.      ED Treatments / Results  Labs (all labs ordered are listed, but only abnormal results are displayed) Labs Reviewed  BASIC METABOLIC PANEL - Abnormal; Notable for the following components:      Result Value   Potassium 3.4 (*)    Glucose, Bld 123 (*)    All other components within normal limits  CBC - Abnormal; Notable for the following components:   Hemoglobin 10.9 (*)    MCHC 29.3 (*)    RDW 16.1 (*)    All other components within normal limits  D-DIMER, QUANTITATIVE (NOT AT [facility])  I-STAT TROPONIN, ED  I-STAT BETA HCG BLOOD, ED ([facility], [facility], [facility] ONLY)  CBG MONITORING, ED    EKG EKG Interpretation  Date/Time:  Friday December 02 2018 20:39:53 EST Ventricular Rate:  112 PR Interval:    QRS Duration: 81 QT Interval:  317 QTC Calculation: 433 R Axis:   82 Text Interpretation:  Sinus tachycardia Baseline wander in lead(s) III aVL V3 V6 Confirmed by Bethann BerkshireZammit, Kahli Mayon [phone]([id number]) on 12/02/2018 11:21:04 PM   Radiology Dg Chest 2 View  Result Date: 12/02/2018 CLINICAL DATA:  Left-sided chest pain and dizziness. EXAM: CHEST - 2 VIEW COMPARISON:  07/11/2018. FINDINGS: Low volume film. The lungs are clear without focal pneumonia,  edema, pneumothorax or pleural effusion. The cardiopericardial silhouette is within normal limits for size. The visualized bony structures of the thorax are intact. IMPRESSION: No active cardiopulmonary disease. Electronically Signed   By: Kennith CenterEric  Mansell M.D.   On: 12/02/2018 20:59    Procedures Procedures (including critical care time)  Medications Ordered in ED Medications  sodium chloride 0.9 % bolus 500 mL (500 mLs Intravenous New Bag/Given 12/02/18 2149)     Initial Impression /  Assessment and Plan / ED Course  I have reviewed the triage vital signs and the nursing notes.  Pertinent labs & imaging results that were available during my care of the patient were reviewed by me and considered in my medical decision making (see chart for details).     Labs unremarkable chest x-ray normal.  Symptoms improved.  Doubt cardiac cause of symptoms.  Patient will follow-up with her PCP  Final Clinical Impressions(s) / ED Diagnoses   Final diagnoses:  Dizziness    ED Discharge Orders    None       Bethann BerkshireZammit, Chelci [place], MD 12/02/18 2338

## 2018-12-02 NOTE — ED Triage Notes (Signed)
Pt reports L sided chest pain and dizziness that started about 3 hours ago. She denies N/V/D. No fever. Denies SOB. States that there were no precipitating factors.

## 2019-02-08 IMAGING — CR DG KNEE COMPLETE 4+V*R*
5 series · 5 of 5 positions shown · non-contrast
Comparison: None.

CLINICAL DATA: Right knee injury.  Fall yesterday.

EXAM:
RIGHT KNEE - COMPLETE 4+ VIEW

[t knee ap right]
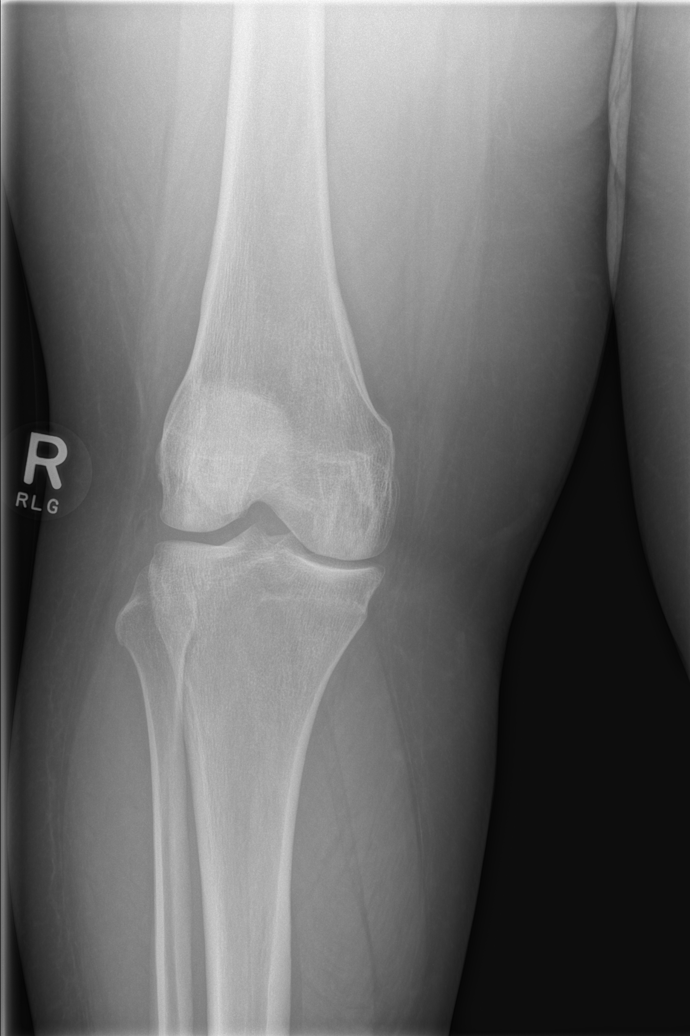

[t knee obl right (1 of 2)]
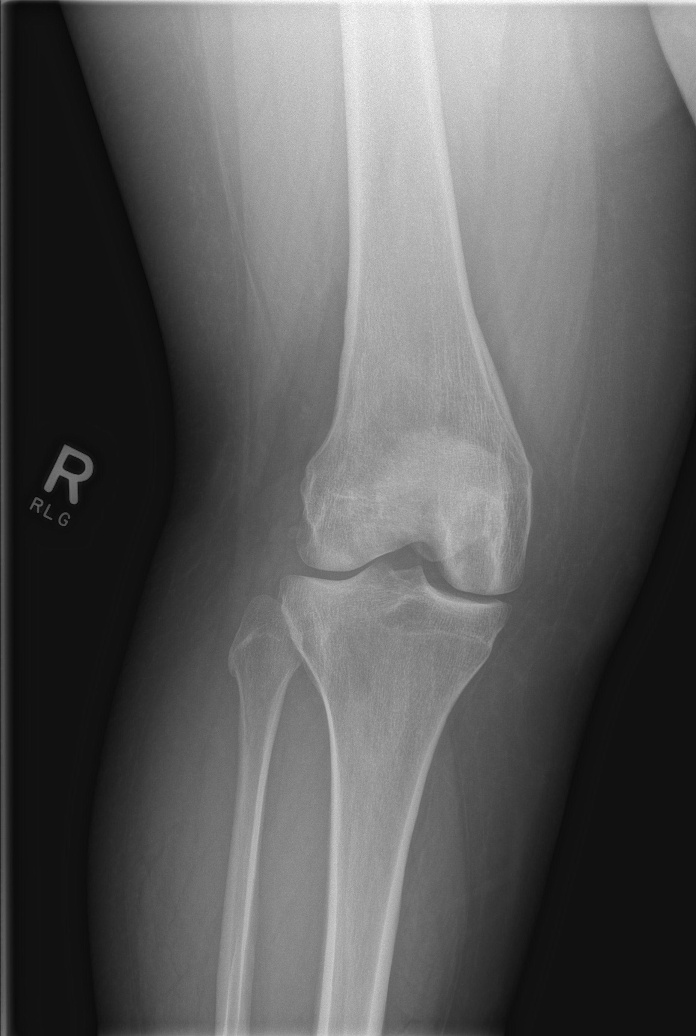

[t knee obl right (2 of 2)]
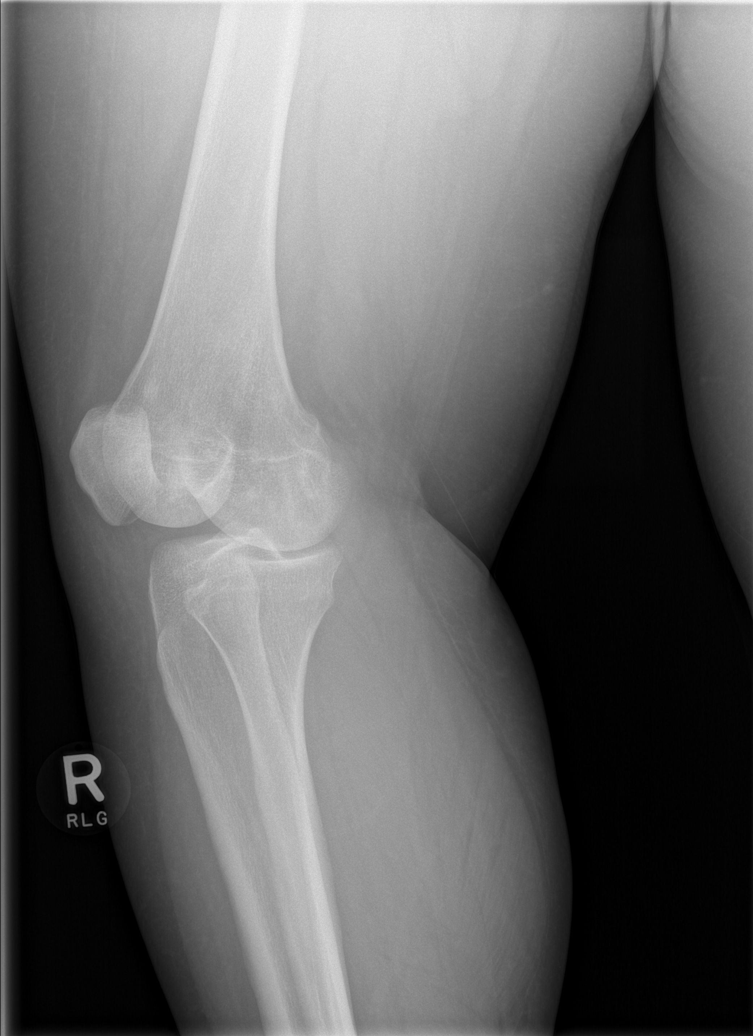

[t knee lat right (1 of 2)]
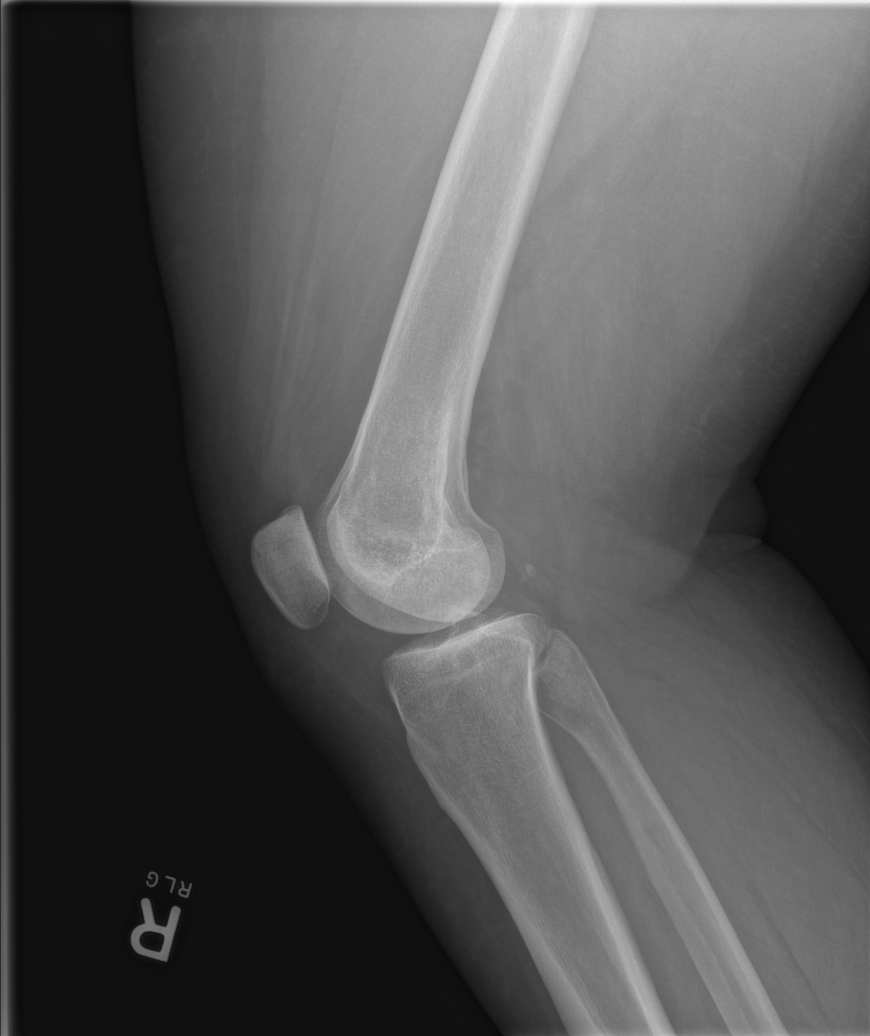

[t knee lat right (2 of 2)]
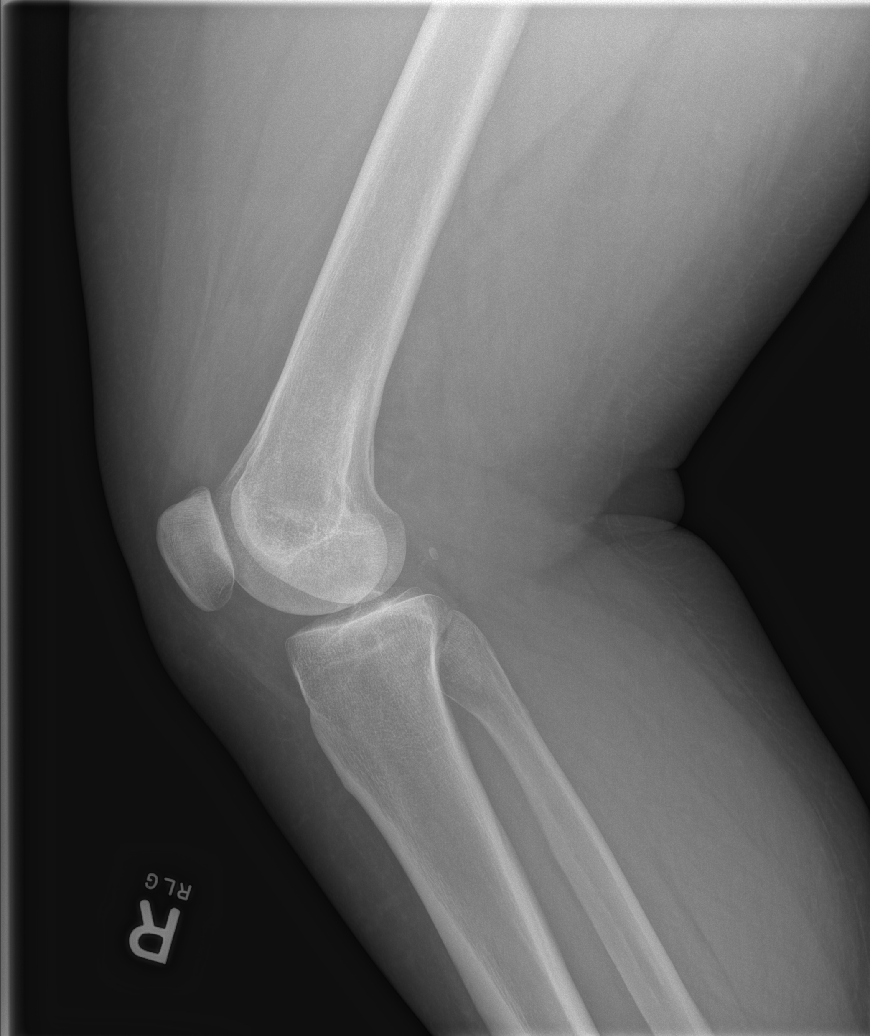

[5 of 5 positions shown; findings below may reference images not displayed]

FINDINGS: No evidence of fracture, dislocation, or joint effusion. No evidence
of arthropathy or other focal bone abnormality. Soft tissues are
unremarkable.
IMPRESSION: Negative right knee radiographs.

## 2019-09-29 ENCOUNTER — Emergency Department (HOSPITAL_COMMUNITY): Payer: Medicaid Other

## 2019-09-29 ENCOUNTER — Encounter (HOSPITAL_COMMUNITY): Payer: Self-pay | Admitting: Emergency Medicine

## 2019-09-29 ENCOUNTER — Other Ambulatory Visit: Payer: Self-pay

## 2019-09-29 ENCOUNTER — Emergency Department (HOSPITAL_COMMUNITY)
Admission: EM | Admit: 2019-09-29 | Discharge: 2019-09-29 | Disposition: A | Discharge status: Home / Self Care | Payer: Medicaid Other | Attending: Emergency Medicine | Admitting: Emergency Medicine

## 2019-09-29 DIAGNOSIS — R0789 Other chest pain: Secondary | ICD-10-CM

## 2019-09-29 DIAGNOSIS — J45909 Unspecified asthma, uncomplicated: Secondary | ICD-10-CM | POA: Insufficient documentation

## 2019-09-29 DIAGNOSIS — E119 Type 2 diabetes mellitus without complications: Secondary | ICD-10-CM | POA: Insufficient documentation

## 2019-09-29 DIAGNOSIS — I10 Essential (primary) hypertension: Secondary | ICD-10-CM | POA: Insufficient documentation

## 2019-09-29 LAB — CBC
HCT: 36.8 % (ref 36.0–46.0)
Hemoglobin: 11 g/dL — ABNORMAL LOW (ref 12.0–15.0)
MCH: 27.2 pg (ref 26.0–34.0)
MCHC: 29.9 g/dL — ABNORMAL LOW (ref 30.0–36.0)
MCV: 90.9 fL (ref 80.0–100.0)
Platelets: 381 10*3/uL (ref 150–400)
RBC: 4.05 MIL/uL (ref 3.87–5.11)
RDW: 15.8 % — ABNORMAL HIGH (ref 11.5–15.5)
WBC: 6.8 10*3/uL (ref 4.0–10.5)
nRBC: 0 % (ref 0.0–0.2)

## 2019-09-29 LAB — BASIC METABOLIC PANEL
Anion gap: 9 (ref 5–15)
BUN: 13 mg/dL (ref 6–20)
CO2: 25 mmol/L (ref 22–32)
Calcium: 8.8 mg/dL — ABNORMAL LOW (ref 8.9–10.3)
Chloride: 108 mmol/L (ref 98–111)
Creatinine, Ser: 0.65 mg/dL (ref 0.44–1.00)
GFR calc Af Amer: >60 mL/min (ref >60)
GFR calc non Af Amer: >60 mL/min (ref >60)
Glucose, Bld: 101 mg/dL — ABNORMAL HIGH (ref 70–99)
Potassium: 3.7 mmol/L (ref 3.5–5.1)
Sodium: 142 mmol/L (ref 135–145)

## 2019-09-29 LAB — TROPONIN I (HIGH SENSITIVITY)
Troponin I (High Sensitivity): <2 ng/L (ref <18)
Troponin I (High Sensitivity): 2 ng/L (ref <18)

## 2019-09-29 LAB — I-STAT BETA HCG BLOOD, ED (MC, WL, AP ONLY): I-stat hCG, quantitative: <5 m[IU]/mL (ref <5)

## 2019-09-29 IMAGING — CR DG CHEST 2V
2 series · 2 of 2 positions shown · non-contrast
Comparison: [DATE]

CLINICAL DATA: Chest pain

EXAM:
CHEST - 2 VIEW

[w chest pa]
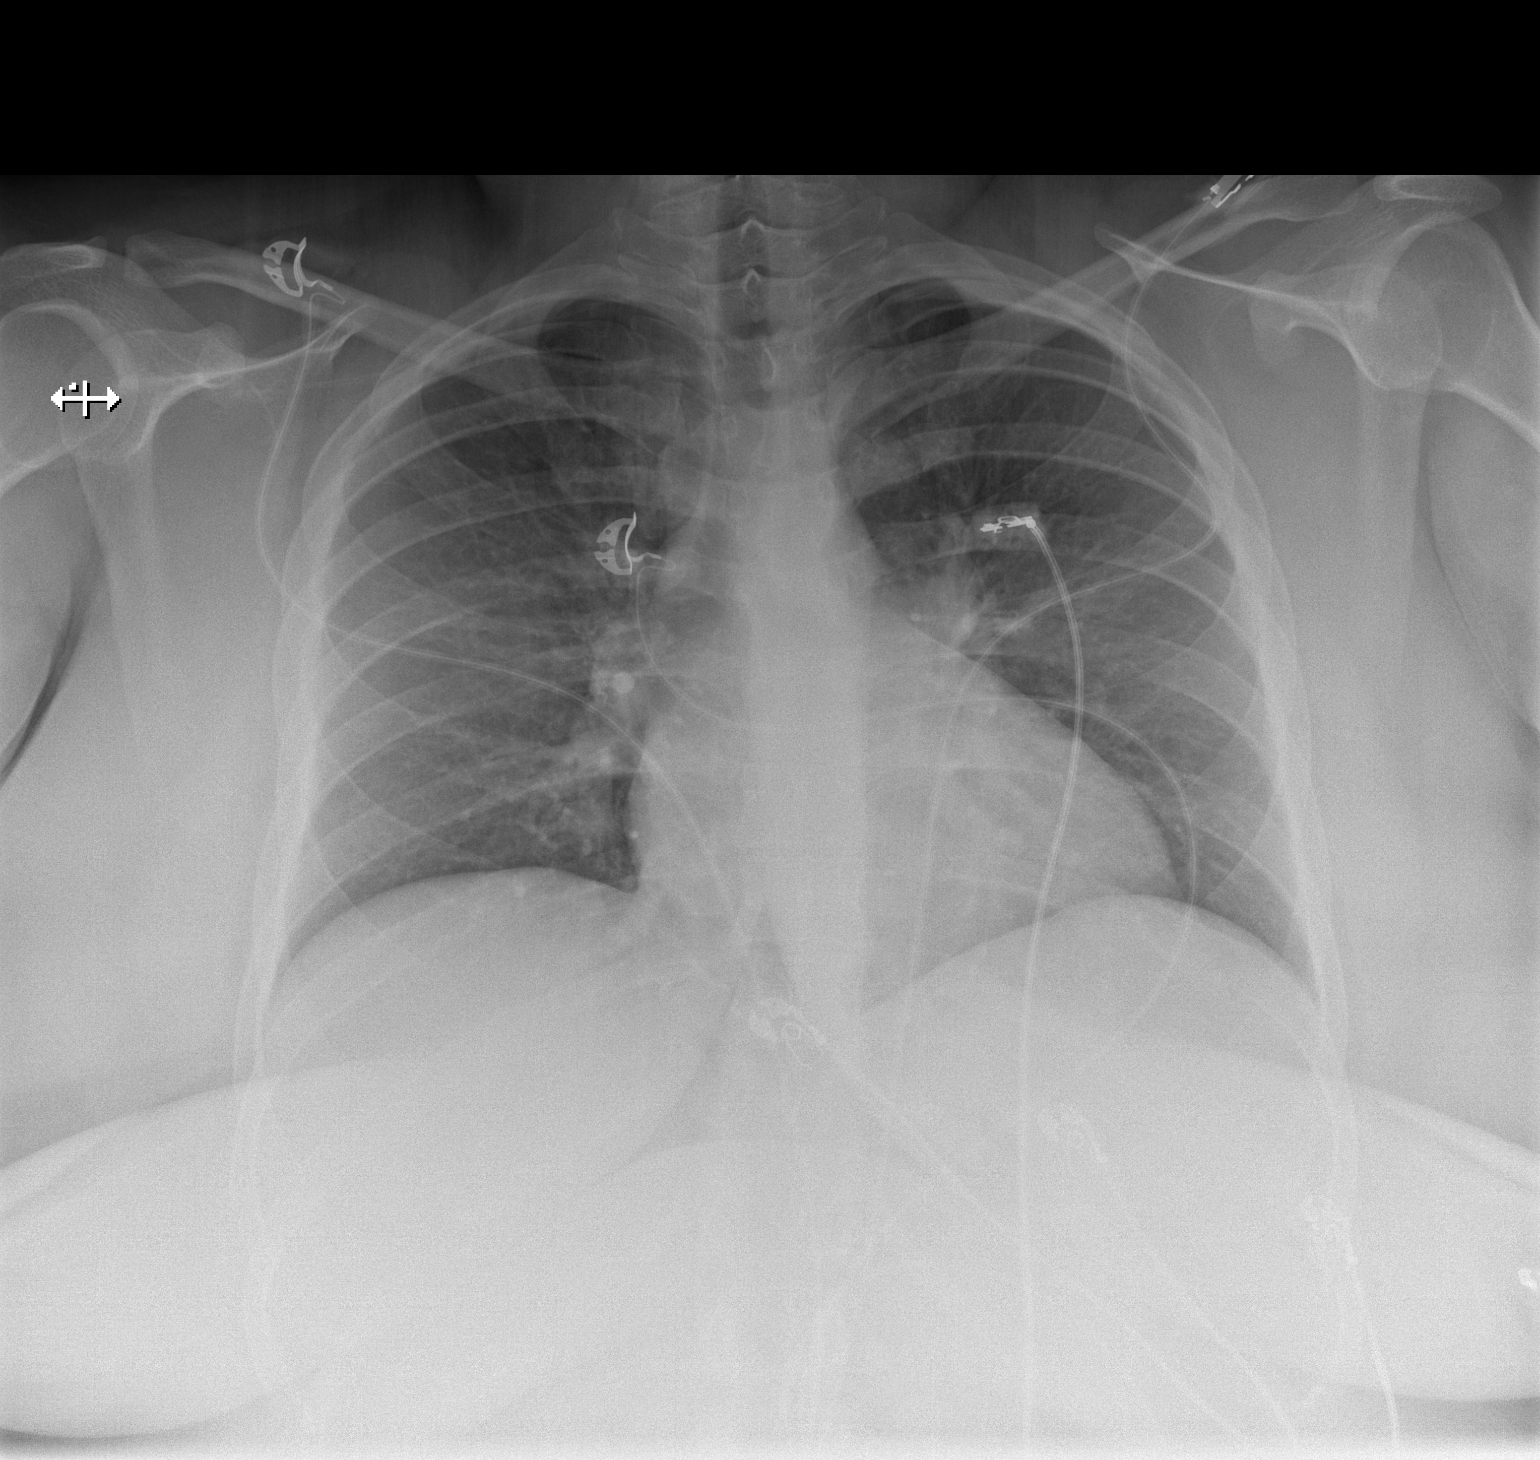

[w chest lat]
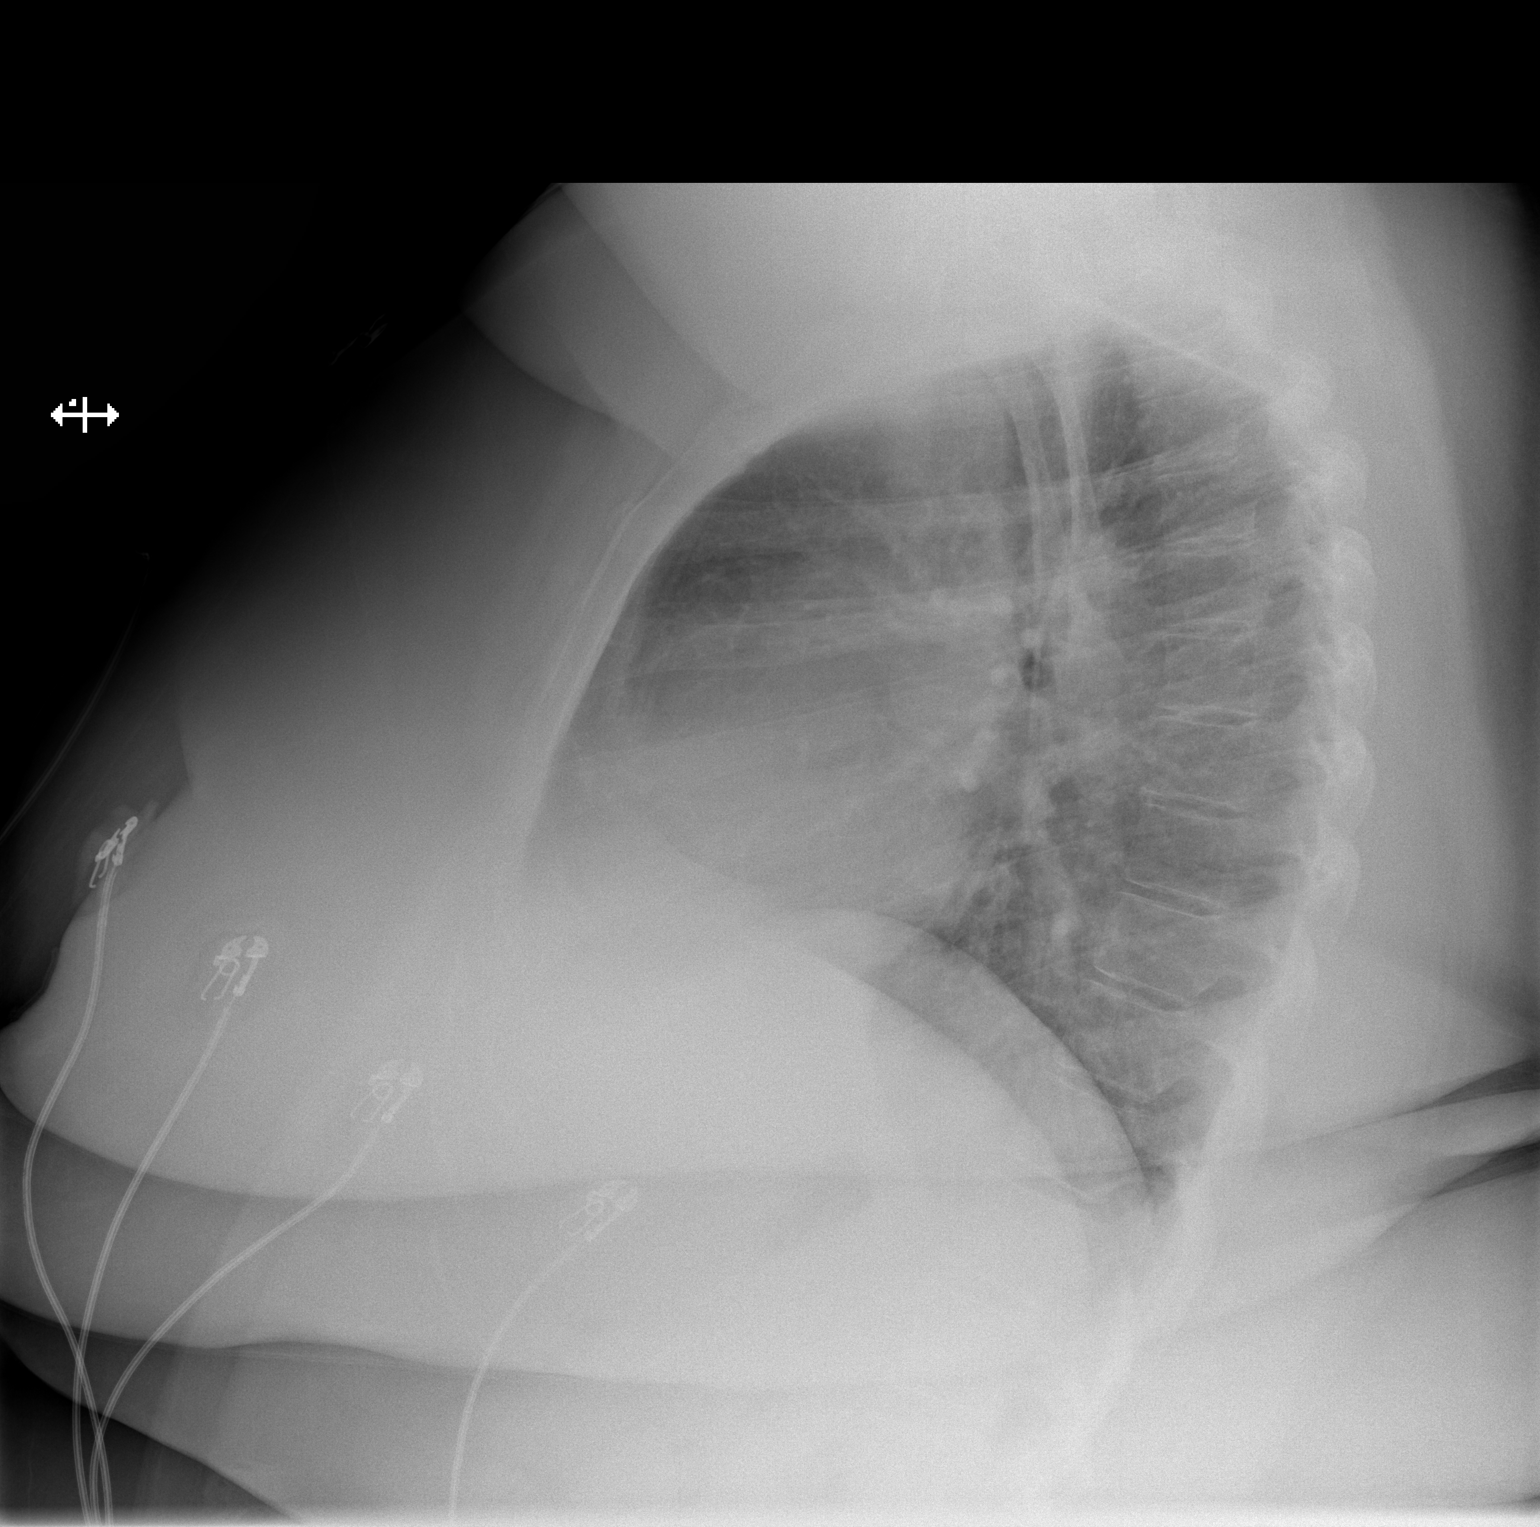

[2 of 2 positions shown; findings below may reference images not displayed]

FINDINGS: The lungs are clear. The heart size and pulmonary vascularity are
normal. No adenopathy. No pneumothorax. No bone lesions.
IMPRESSION: No edema or consolidation.

## 2019-09-29 MED ORDER — MORPHINE SULFATE (PF) 4 MG/ML IV SOLN
6.0000 mg | Freq: Once | INTRAVENOUS | Status: AC
  Administered 2019-09-29: 15:00:00 6 mg via INTRAVENOUS
  Filled 2019-09-29: qty 2

## 2019-09-29 MED ORDER — TECHNETIUM TO 99M ALBUMIN AGGREGATED
1.4700 | Freq: Once | INTRAVENOUS | Status: AC | PRN
  Administered 2019-09-29: 1.47 via INTRAVENOUS

## 2019-09-29 NOTE — ED Notes (Signed)
Patient transported to V/Q scan 

## 2019-09-29 NOTE — ED Notes (Signed)
ED Provider at bedside. 

## 2019-09-29 NOTE — ED Triage Notes (Signed)
Per pt, states she started having left chest pain 3 nights ago-states history of PE-takes Lovenox BID

## 2019-09-29 NOTE — ED Provider Notes (Signed)
[facility]-EMERGENCY DEPT Provider Note   CSN: [id number] Arrival date & time: 09/29/19  1116     History   Chief Complaint Chief Complaint  Patient presents with  . Chest Pain    HPI ERNISHA SORN is a 28 y.o. female.     28 year old female with history of recurrent PEs who is on Lovenox who presents chest pain which is been persistent times several days consistent with her prior PE episodes.  No she has been somewhat more dyspneic.  Denies any fever or chills.  Chest pain has been on anginal but somewhat pleuritic.  States that she has been compliant with her Lovenox.  Denies any syncope.     Past Medical History:  Diagnosis Date  . Asthma   . Diabetes mellitus without complication (HCC)    type 2  . Hypertension   . Pulmonary embolism (HCC)     There are no active problems to display for this patient.   Past Surgical History:  Procedure Laterality Date  . CESAREAN SECTION    . NO PAST SURGERIES       OB History    Gravida  2   Para      Term      Preterm      AB  2   Living  0     SAB  1   TAB  1   Ectopic      Multiple      Live Births               Home Medications    Prior to Admission medications   Medication Sig Start Date End Date Taking? Authorizing Provider  acetaminophen (TYLENOL) 325 MG tablet Take 325 mg by mouth every 6 (six) hours as needed for moderate pain.    [provider]  albuterol (VENTOLIN HFA) 108 (90 Base) MCG/ACT inhaler Inhale 1 puff into the lungs every 6 (six) hours as needed for wheezing or shortness of breath.  11/10/14   [provider]  enoxaparin (LOVENOX) 150 MG/ML injection Inject 135 mg into the skin every 12 (twelve) hours.  07/12/18   [provider]  EPINEPHrine 0.3 mg/0.3 mL IJ SOAJ injection Inject 0.3 mg into the muscle once.  11/10/14   [provider]  ferrous sulfate 325 (65 FE) MG EC tablet Take 325 mg by mouth daily. 11/10/14    [provider]  Multiple Vitamins-Minerals (MULTIVITAMIN ADULT PO) Take 1 tablet by mouth daily.    [provider]    Family History Family History  Problem Relation Age of Onset  . Diabetes Mother   . Asthma Mother   . Asthma Maternal Aunt   . Diabetes Maternal Aunt     Social History Social History   Tobacco Use  . Smoking status: Never Smoker  . Smokeless tobacco: Never Used  Substance Use Topics  . Alcohol use: No  . Drug use: No     Allergies   Contrast media [iodinated diagnostic agents], Dilaudid [hydromorphone], Tramadol, Apple, Banana, Ketorolac, Other, Solu-medrol [methylprednisolone], and Toradol [ketorolac tromethamine]   Review of Systems Review of Systems  All other systems reviewed and are negative.    Physical Exam Updated Vital Signs BP (!) 148/108 (BP Location: Left Arm)   Pulse 92   Temp 98.9 F (37.2 C) (Oral)   Resp 18   SpO2 99%   Physical Exam Vitals signs and nursing note reviewed.  Constitutional:  General: She is not in acute distress.    Appearance: Normal appearance. She is well-developed. She is not toxic-appearing.  HENT:     Head: Normocephalic and atraumatic.  Eyes:     General: Lids are normal.     Conjunctiva/sclera: Conjunctivae normal.     Pupils: Pupils are equal, round, and reactive to light.  Neck:     Musculoskeletal: Normal range of motion and neck supple.     Thyroid: No thyroid mass.     Trachea: No tracheal deviation.  Cardiovascular:     Rate and Rhythm: Normal rate and regular rhythm.     Heart sounds: Normal heart sounds. No murmur. No gallop.   Pulmonary:     Effort: Pulmonary effort is normal. No respiratory distress.     Breath sounds: Normal breath sounds. No stridor. No decreased breath sounds, wheezing, rhonchi or rales.  Abdominal:     General: Bowel sounds are normal. There is no distension.     Palpations: Abdomen is soft.     Tenderness: There is no abdominal  tenderness. There is no rebound.  Musculoskeletal: Normal range of motion.        General: No tenderness.  Skin:    General: Skin is warm and dry.     Findings: No abrasion or rash.  Neurological:     Mental Status: She is alert and oriented to person, place, and time.     GCS: GCS eye subscore is 4. GCS verbal subscore is 5. GCS motor subscore is 6.     Cranial Nerves: No cranial nerve deficit.     Sensory: No sensory deficit.  Psychiatric:        Speech: Speech normal.        Behavior: Behavior normal.      ED Treatments / Results  Labs (all labs ordered are listed, but only abnormal results are displayed) Labs Reviewed  CBC - Abnormal; Notable for the following components:      Result Value   Hemoglobin 11.0 (*)    MCHC 29.9 (*)    RDW 15.8 (*)    All other components within normal limits  BASIC METABOLIC PANEL  I-STAT BETA HCG BLOOD, ED ([facility], [facility], [facility] ONLY)  TROPONIN I (HIGH SENSITIVITY)    EKG EKG Interpretation  Date/Time:  Friday September 29 2019 11:22:53 EDT Ventricular Rate:  91 PR Interval:    QRS Duration: 88 QT Interval:  357 QTC Calculation: 440 R Axis:   69 Text Interpretation:  Sinus rhythm No significant change since last tracing Confirmed by Lacretia Leigh ([id number]) on 09/29/2019 12:11:18 PM   Radiology Dg Chest 2 View  Result Date: 09/29/2019 CLINICAL DATA:  Chest pain EXAM: CHEST - 2 VIEW COMPARISON:  December 02, 2018 FINDINGS: The lungs are clear. The heart size and pulmonary vascularity are normal. No adenopathy. No pneumothorax. No bone lesions. IMPRESSION: No edema or consolidation. Electronically Signed   By: Lowella Grip III M.D.   On: 09/29/2019 12:10    Procedures Procedures (including critical care time)  Medications Ordered in ED Medications - No data to display   Initial Impression / Assessment and Plan / ED Course  I have reviewed the triage vital signs and the nursing notes.  Pertinent labs & imaging results that were  available during my care of the patient were reviewed by me and considered in my medical decision making (see chart for details).        Patient medicated for pain and feels  better.  VQ scan is negative for PE.  Patient is EKG without acute ischemic changes.  Delta troponin negative.  Will discharge home.  I assume that this is likely asthma as she does have a history of this and her home inhalers did seem to help.  Final Clinical Impressions(s) / ED Diagnoses   Final diagnoses:  None    ED Discharge Orders    None       Lorre NickAllen, Keajah Killough, MD 09/29/19 1511

## 2019-10-25 ENCOUNTER — Encounter (HOSPITAL_COMMUNITY): Payer: Self-pay

## 2019-10-25 ENCOUNTER — Emergency Department (HOSPITAL_COMMUNITY): Payer: Medicaid Other

## 2019-10-25 ENCOUNTER — Other Ambulatory Visit: Payer: Self-pay

## 2019-10-25 ENCOUNTER — Emergency Department (HOSPITAL_COMMUNITY)
Admission: EM | Admit: 2019-10-25 | Discharge: 2019-10-25 | Disposition: A | Discharge status: Home / Self Care | Payer: Medicaid Other | Attending: Emergency Medicine | Admitting: Emergency Medicine

## 2019-10-25 DIAGNOSIS — I10 Essential (primary) hypertension: Secondary | ICD-10-CM | POA: Diagnosis not present

## 2019-10-25 DIAGNOSIS — R42 Dizziness and giddiness: Secondary | ICD-10-CM | POA: Insufficient documentation

## 2019-10-25 DIAGNOSIS — M545 Low back pain, unspecified: Secondary | ICD-10-CM

## 2019-10-25 DIAGNOSIS — R509 Fever, unspecified: Secondary | ICD-10-CM | POA: Insufficient documentation

## 2019-10-25 DIAGNOSIS — J45909 Unspecified asthma, uncomplicated: Secondary | ICD-10-CM | POA: Diagnosis not present

## 2019-10-25 DIAGNOSIS — Z79899 Other long term (current) drug therapy: Secondary | ICD-10-CM | POA: Diagnosis not present

## 2019-10-25 DIAGNOSIS — E119 Type 2 diabetes mellitus without complications: Secondary | ICD-10-CM | POA: Diagnosis not present

## 2019-10-25 DIAGNOSIS — R042 Hemoptysis: Secondary | ICD-10-CM | POA: Insufficient documentation

## 2019-10-25 DIAGNOSIS — R600 Localized edema: Secondary | ICD-10-CM | POA: Insufficient documentation

## 2019-10-25 DIAGNOSIS — G8929 Other chronic pain: Secondary | ICD-10-CM | POA: Diagnosis not present

## 2019-10-25 DIAGNOSIS — R000 Tachycardia, unspecified: Secondary | ICD-10-CM | POA: Insufficient documentation

## 2019-10-25 DIAGNOSIS — R209 Unspecified disturbances of skin sensation: Secondary | ICD-10-CM | POA: Diagnosis not present

## 2019-10-25 DIAGNOSIS — Z7984 Long term (current) use of oral hypoglycemic drugs: Secondary | ICD-10-CM | POA: Insufficient documentation

## 2019-10-25 LAB — BASIC METABOLIC PANEL
Anion gap: 8 (ref 5–15)
BUN: 15 mg/dL (ref 6–20)
CO2: 25 mmol/L (ref 22–32)
Calcium: 9.2 mg/dL (ref 8.9–10.3)
Chloride: 105 mmol/L (ref 98–111)
Creatinine, Ser: 0.75 mg/dL (ref 0.44–1.00)
GFR calc Af Amer: >60 mL/min (ref >60)
GFR calc non Af Amer: >60 mL/min (ref >60)
Glucose, Bld: 108 mg/dL — ABNORMAL HIGH (ref 70–99)
Potassium: 3.9 mmol/L (ref 3.5–5.1)
Sodium: 138 mmol/L (ref 135–145)

## 2019-10-25 LAB — CBC WITH DIFFERENTIAL/PLATELET
Abs Immature Granulocytes: 0.04 10*3/uL (ref 0.00–0.07)
Basophils Absolute: 0 10*3/uL (ref 0.0–0.1)
Basophils Relative: 1 %
Eosinophils Absolute: 0.1 10*3/uL (ref 0.0–0.5)
Eosinophils Relative: 1 %
HCT: 37.8 % (ref 36.0–46.0)
Hemoglobin: 11.5 g/dL — ABNORMAL LOW (ref 12.0–15.0)
Immature Granulocytes: 1 %
Lymphocytes Relative: 44 %
Lymphs Abs: 3.8 10*3/uL (ref 0.7–4.0)
MCH: 27.4 pg (ref 26.0–34.0)
MCHC: 30.4 g/dL (ref 30.0–36.0)
MCV: 90 fL (ref 80.0–100.0)
Monocytes Absolute: 0.4 10*3/uL (ref 0.1–1.0)
Monocytes Relative: 5 %
Neutro Abs: 4.3 10*3/uL (ref 1.7–7.7)
Neutrophils Relative %: 48 %
Platelets: 387 10*3/uL (ref 150–400)
RBC: 4.2 MIL/uL (ref 3.87–5.11)
RDW: 15.7 % — ABNORMAL HIGH (ref 11.5–15.5)
WBC: 8.6 10*3/uL (ref 4.0–10.5)
nRBC: 0 % (ref 0.0–0.2)

## 2019-10-25 LAB — RAPID URINE DRUG SCREEN, HOSP PERFORMED
Amphetamines: NOT DETECTED
Barbiturates: NOT DETECTED
Benzodiazepines: NOT DETECTED
Cocaine: NOT DETECTED
Opiates: NOT DETECTED
Tetrahydrocannabinol: NOT DETECTED

## 2019-10-25 LAB — BRAIN NATRIURETIC PEPTIDE: B Natriuretic Peptide: 19.6 pg/mL (ref 0.0–100.0)

## 2019-10-25 LAB — D-DIMER, QUANTITATIVE: D-Dimer, Quant: <0.27 ug/mL-FEU (ref 0.00–0.50)

## 2019-10-25 LAB — I-STAT BETA HCG BLOOD, ED (MC, WL, AP ONLY): I-stat hCG, quantitative: <5 m[IU]/mL (ref <5)

## 2019-10-25 IMAGING — DX DG CHEST 1V PORT
1 series · 1 of 1 positions shown · non-contrast
Comparison: None.

CLINICAL DATA: Cough. Upper back pain.

EXAM:
PORTABLE CHEST 1 VIEW

[chest ap]
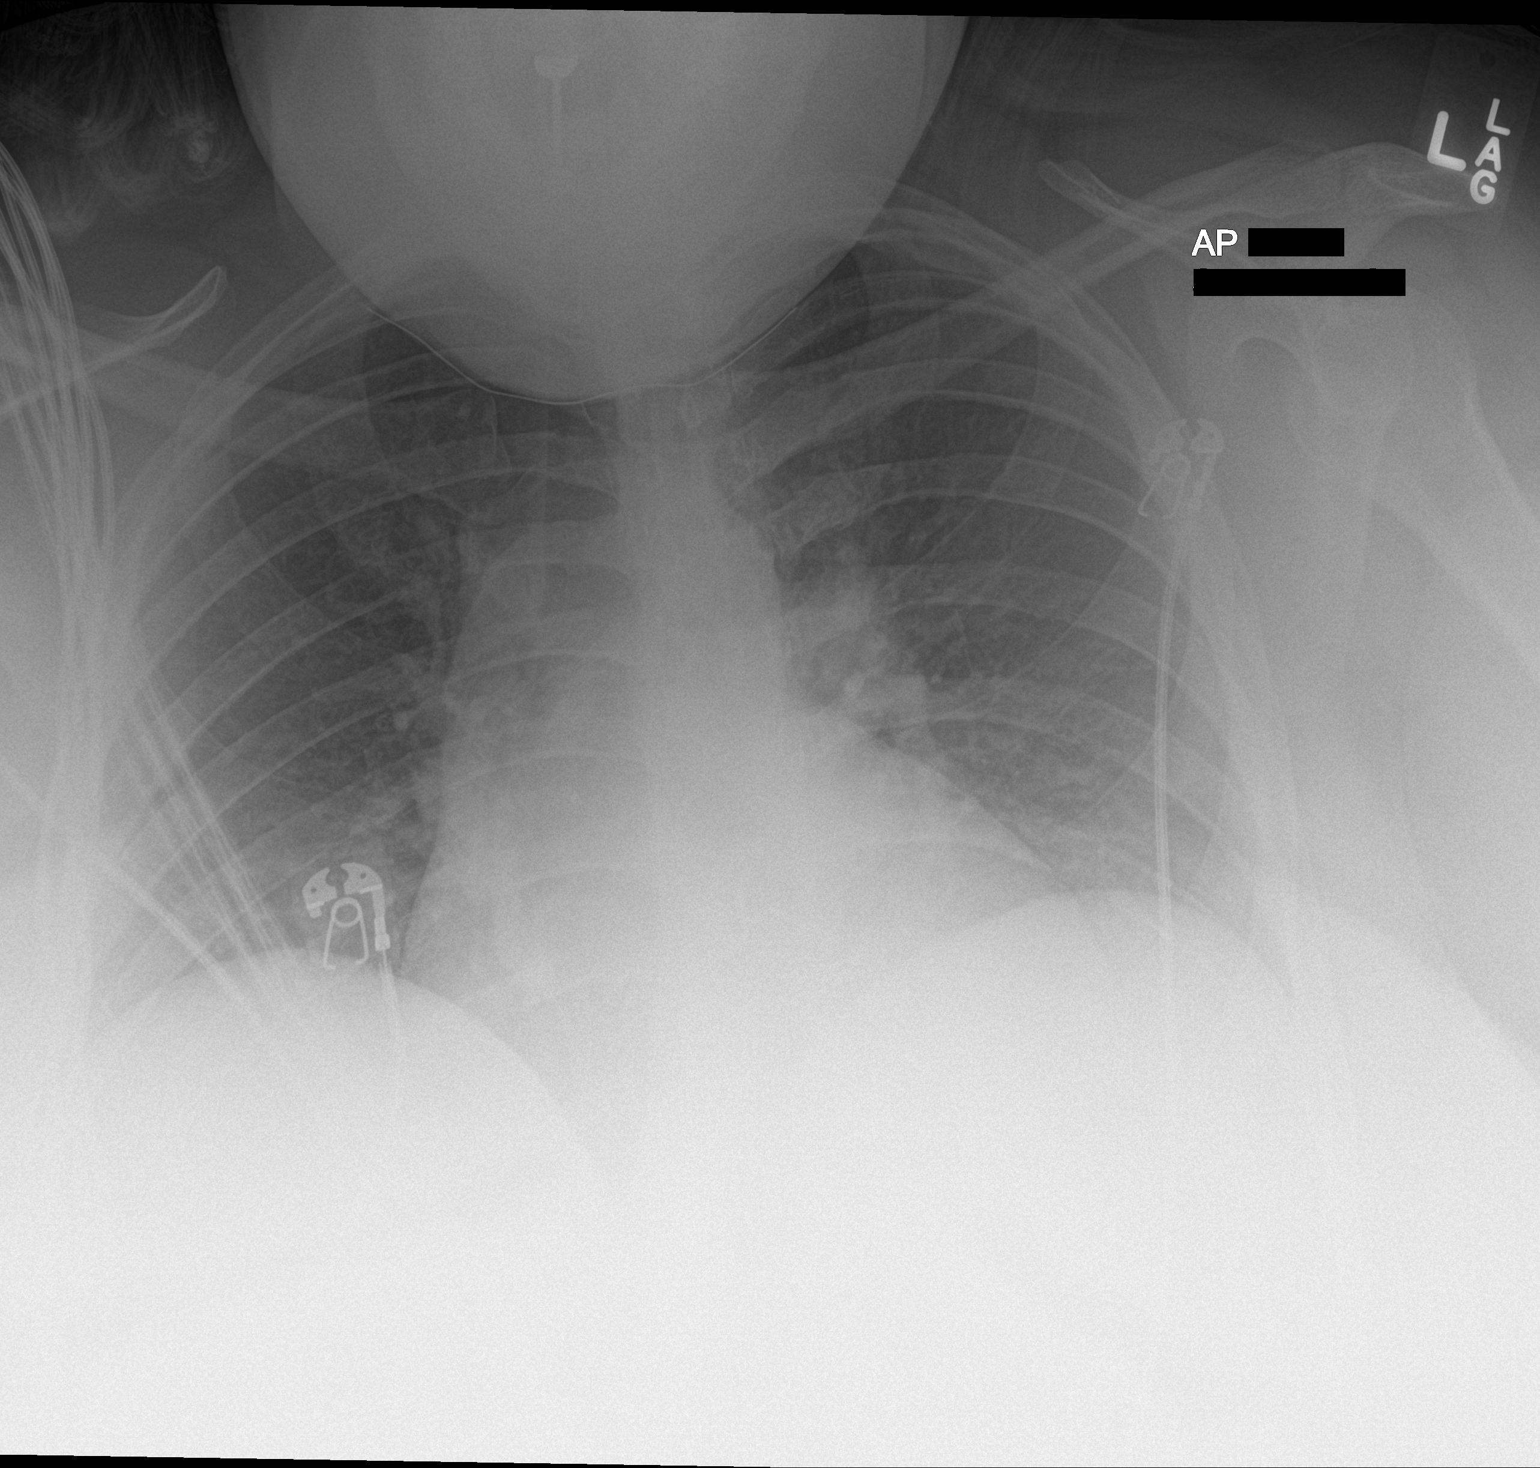

[1 of 1 positions shown; findings below may reference images not displayed]

FINDINGS: The heart is enlarged. Lung volumes are low. Moderate pulmonary
vascular congestion is present. No significant airspace
consolidation is present.
IMPRESSION: 1. Cardiomegaly and moderate pulmonary vascular congestion
suggesting congestive heart failure.
2. No focal airspace disease or pneumothorax.
3. Low lung volumes.

## 2019-10-25 MED ORDER — METHOCARBAMOL 1000 MG/10ML IJ SOLN
500.0000 mg | Freq: Once | INTRAVENOUS | Status: AC
  Administered 2019-10-25: 500 mg via INTRAVENOUS
  Filled 2019-10-25: qty 500

## 2019-10-25 MED ORDER — SODIUM CHLORIDE 0.9 % IV BOLUS
1000.0000 mL | Freq: Once | INTRAVENOUS | Status: AC
  Administered 2019-10-25: 1000 mL via INTRAVENOUS

## 2019-10-25 MED ORDER — MORPHINE SULFATE (PF) 4 MG/ML IV SOLN
4.0000 mg | Freq: Once | INTRAVENOUS | Status: AC
  Administered 2019-10-25: 4 mg via INTRAVENOUS
  Filled 2019-10-25: qty 1

## 2019-10-25 MED ORDER — LORAZEPAM 2 MG/ML IJ SOLN
1.0000 mg | Freq: Once | INTRAMUSCULAR | Status: AC
  Administered 2019-10-25: 06:00:00 1 mg via INTRAVENOUS
  Filled 2019-10-25: qty 1

## 2019-10-25 NOTE — Discharge Instructions (Signed)
Call your primary care doctor and/or your pain management provider today to set up an outpatient MRI for further evaluation of your back. Return to the emergency room with any loss of bowel bladder control or persistent high fevers. Return with any new, worsening, or concerning symptoms.

## 2019-10-25 NOTE — Progress Notes (Signed)
Due to pt body habitus. Pt is too large to fit into closed bore scanner. MRI was not able to be preformed. Attempted to call RN twice to inform her. No answer.

## 2019-10-25 NOTE — ED Triage Notes (Signed)
Pt complains of low back pain for two days, she also states that she's been dizzy and coughing up blood for two days

## 2019-10-25 NOTE — ED Provider Notes (Addendum)
  Physical Exam  BP 120/75   Pulse 99   Temp 99.1 F (37.3 C) (Oral)   Resp (!) 35   Ht 5\' 3"  (1.6 m)   Wt 123.8 kg   LMP 09/16/2019   SpO2 94%   BMI 48.36 kg/m   Physical Exam   Gen: appears nontoxic  ED Course/Procedures     Procedures  MDM  Patient signed out to me by Ermalinda Memos, PA-C.  Please see previous notes for further history.  In brief, patient presenting for evaluation of worsening back pain since the injection 2 days ago.  Reports an outpatient temperature of 100.5.  Additionally, patient reporting cough with hemoptysis and dizziness.  History of DVT many years ago, no longer on thinners.  Labs reassuring, dimer negative.  X-ray showed possible edema, although likely just due to body habitus.  As such, BNP was added on.  As patient recently had a procedure on her back, MRI pending.  BNP normal. Patient unable to get an MRI due to body habitus and weight.  Patient is very comfortable and does not appear in distress in the room. I attempted to call pt's pain doctor (who performed the injections) multiple times, no answer.  Discussed option of sending patient to [facility] for MRI versus scheduling an outpatient MRI with patient's PCP.  Patient elects to follow-up with her PCP for outpatient MRI.  Discussed risks including possible infection that could lead to worsen symptoms.  Discussed strict return precautions including any worsening symptoms.  At this time, patient appears safe for discharge.  Return precautions given.  Patient states she understands and agrees to plan.      Franchot Heidelberg, PA-C 10/25/19 1125    Blanchie Dessert, MD 10/25/19 1542

## 2019-10-25 NOTE — ED Provider Notes (Signed)
[facility]-EMERGENCY DEPT Provider Note   CSN: [id number] Arrival date & time: 10/25/19  0221     History   Chief Complaint Chief Complaint  Patient presents with  . Back Pain  . Dizziness    HPI Felicia Bradley is a 28 y.o. female.    28 year old female presents to the emergency department for complaints of low back pain.  She does have a history of chronic back pain, but states that her pain has been worse over the past 2 days since seen by her pain management doctor and receiving a shot in her back.  Pain is sharp, constant, aggravated with movement and ambulation as well as when sitting upright.  She has not had any improvement with over-the-counter Tylenol or ibuprofen.  She also took a tablet of Percocet from her sister which provided no pain relief.  Denies extremity numbness, paresthesias, bowel or bladder incontinence.  Reports that she may have had a temperature of Tmax 100.5 F since the procedure with her pain management doctor.  No antipyretics prior to arrival.  As an aside, patient also complaining of dizziness aggravated with position change.  She has not had any syncope.  Expresses concern about ongoing hemoptysis.  She was previously on Lovenox for history of blood clot.  She is no longer taking this medication at the advice of her hematologist.  The history is provided by the patient. No language interpreter was used.    Past Medical History:  Diagnosis Date  . Asthma   . Diabetes mellitus without complication (HCC)    type 2  . Hypertension   . Pulmonary embolism (HCC)     There are no active problems to display for this patient.   Past Surgical History:  Procedure Laterality Date  . CESAREAN SECTION    . NO PAST SURGERIES       OB History    Gravida  2   Para      Term      Preterm      AB  2   Living  0     SAB  1   TAB  1   Ectopic      Multiple      Live Births               Home Medications     Prior to Admission medications   Medication Sig Start Date End Date Taking? Authorizing Provider  acetaminophen (TYLENOL) 325 MG tablet Take 325 mg by mouth every 6 (six) hours as needed for moderate pain.   Yes [provider]  albuterol (VENTOLIN HFA) 108 (90 Base) MCG/ACT inhaler Inhale 1 puff into the lungs every 6 (six) hours as needed for wheezing or shortness of breath.  11/10/14  Yes [provider]  ALPRAZolam Prudy Feeler) 0.5 MG tablet Take 0.5 mg by mouth 3 (three) times daily. 05/17/19  Yes [provider]  EPINEPHrine 0.3 mg/0.3 mL IJ SOAJ injection Inject 0.3 mg into the muscle once.  11/10/14  Yes [provider]  ferrous sulfate 325 (65 FE) MG EC tablet Take 325 mg by mouth daily. 11/10/14  Yes [provider]  gabapentin (NEURONTIN) 300 MG capsule Take 300 mg by mouth 3 (three) times daily. 05/15/19  Yes [provider]  ipratropium-albuterol (DUONEB) 0.5-2.5 (3) MG/3ML SOLN Take 3 mLs by nebulization 3 (three) times daily.  07/19/19  Yes [provider]  Multiple Vitamins-Minerals (MULTIVITAMIN ADULT PO) Take 1 tablet by  mouth daily.   Yes [provider]  traZODone (DESYREL) 100 MG tablet Take 150 mg by mouth at bedtime as needed for sleep.  05/16/19  Yes [provider]    Family History Family History  Problem Relation Age of Onset  . Diabetes Mother   . Asthma Mother   . Asthma Maternal Aunt   . Diabetes Maternal Aunt     Social History Social History   Tobacco Use  . Smoking status: Never Smoker  . Smokeless tobacco: Never Used  Substance Use Topics  . Alcohol use: No  . Drug use: No     Allergies   Contrast media [iodinated diagnostic agents], Dilaudid [hydromorphone], Tramadol, Apple, Banana, Ketorolac, Other, Solu-medrol [methylprednisolone], and Toradol [ketorolac tromethamine]   Review of Systems Review of Systems Ten systems reviewed and are negative for acute change, except as  noted in the HPI.    Physical Exam Updated Vital Signs BP (!) 91/46   Pulse 100   Temp 99.1 F (37.3 C) (Oral)   Resp 19   Ht 5\' 3"  (1.6 m)   Wt 123.8 kg   LMP 09/16/2019   SpO2 96%   BMI 48.36 kg/m   Physical Exam Vitals signs and nursing note reviewed.  Constitutional:      General: She is not in acute distress.    Appearance: She is well-developed. She is not diaphoretic.     Comments: Super morbidly obese AA female.  HENT:     Head: Normocephalic and atraumatic.  Eyes:     General: No scleral icterus.    Conjunctiva/sclera: Conjunctivae normal.  Neck:     Musculoskeletal: Normal range of motion.  Cardiovascular:     Rate and Rhythm: Regular rhythm. Tachycardia present.     Pulses: Normal pulses.  Pulmonary:     Effort: Pulmonary effort is normal. No respiratory distress.     Comments: Respirations even and unlabored. No hypoxia. Genitourinary:    Comments: Intact rectal tone. Exam chaperoned by RN. Musculoskeletal: Normal range of motion.  Skin:    General: Skin is warm and dry.     Coloration: Skin is not pale.     Findings: No erythema or rash.  Neurological:     General: No focal deficit present.     Mental Status: She is alert and oriented to person, place, and time.     Coordination: Coordination normal.     Comments: Moving all extremities spontaneously. Able to transition in bed without assistance. Sensation to light touch intact in BLE; reports subjective decreased sensation in the LLE. Patellar and achilles reflexes symmetric.  Psychiatric:        Behavior: Behavior normal.      ED Treatments / Results  Labs (all labs ordered are listed, but only abnormal results are displayed) Labs Reviewed  CBC WITH DIFFERENTIAL/PLATELET - Abnormal; Notable for the following components:      Result Value   Hemoglobin 11.5 (*)    RDW 15.7 (*)    All other components within normal limits  BASIC METABOLIC PANEL - Abnormal; Notable for the following  components:   Glucose, Bld 108 (*)    All other components within normal limits  D-DIMER, QUANTITATIVE (NOT AT [facility])  RAPID URINE DRUG SCREEN, HOSP PERFORMED  BRAIN NATRIURETIC PEPTIDE  I-STAT BETA HCG BLOOD, ED ([facility], [facility], [facility] ONLY)    EKG None  Radiology Dg Chest Port 1 View  Result Date: 10/25/2019 CLINICAL DATA:  Cough. Upper back pain. EXAM: PORTABLE  CHEST 1 VIEW COMPARISON:  None. FINDINGS: The heart is enlarged. Lung volumes are low. Moderate pulmonary vascular congestion is present. No significant airspace consolidation is present. IMPRESSION: 1. Cardiomegaly and moderate pulmonary vascular congestion suggesting congestive heart failure. 2. No focal airspace disease or pneumothorax. 3. Low lung volumes. Electronically Signed   By: Marin Roberts M.D.   On: 10/25/2019 04:45    Procedures Procedures (including critical care time)  Medications Ordered in ED Medications  methocarbamol (ROBAXIN) 500 mg in dextrose 5 % 50 mL IVPB (500 mg Intravenous New Bag/Given 10/25/19 0459)  morphine 4 MG/ML injection 4 mg (4 mg Intravenous Given 10/25/19 0459)  sodium chloride 0.9 % bolus 1,000 mL (1,000 mLs Intravenous New Bag/Given 10/25/19 0457)  LORazepam (ATIVAN) injection 1 mg (1 mg Intravenous Given 10/25/19 1914)     Initial Impression / Assessment and Plan / ED Course  I have reviewed the triage vital signs and the nursing notes.  Pertinent labs & imaging results that were available during my care of the patient were reviewed by me and considered in my medical decision making (see chart for details).        3:48 AM Patient seen and assessed. She has a complex medical history with multiple comorbidities, many of which circle back to complaints of pain at various sites.   She is complaining today of back pain which as been worsening since fluoroscopic guided bupivacaine injection of her lumbar spine. This was performed by her pain management doctor at [facility].  Well documented  history of chronic back pain; "facet joint mediated disease that is aggravated by hyperextension, rotation, or lateral bending".  She remains ambulatory and denies bowel or bladder incontinence.  Is neurovascularly intact on exam, moving all extremities.  Noted to have intact rectal tone.   Patient reporting temperature of 100.5 F at home, though she is afebrile in the ED tonight.  Given recent invasive procedure, feel that MRI with and without contrast is necessary to ensure no developing infection causing worsening pain.  As an aside, patient is also complaining of dizziness as well as hemoptysis.  She has not had any syncope.  Notes that her dizziness is aggravated with change in position.  She was previously on Lovenox for history of blood clot.  She is no longer taking this medication.  Per patient's hematologist at [facility], patient with "complex history of possible VTE events. Pt report is of events and history so we tried to piece together the history from available radiologic data and documentation, which is at times quite conflicting. In summary, the only report that clearly suggets history of VTE is LE duplex 7/23 which is age-indeterminant, but I query if this could be chronic given the findings suggest recannulization. Oddly, [place] week later was negative. Only 1 VQ scan has been intermediate, multiple negative studies, in a patient with known features that can complicate interpretation of VQ scans - specifically, morbid obesity, chronic lung disease, COPD vs Asthma".  On extensive chart review of recent work-ups, patient has had a negative VQ scan in August 2020.  She also had a negative D-dimer at the end of August as well as a negative lower extremity venous duplex.  VQ scan was repeated at this facility in October 2020 and was low risk for pulmonary embolus.  In the ED today, she has no cough, hypoxia, other signs of respiratory distress.  She is tachycardic, but history suggests a degree of  intermittent tachycardia at baseline.  Plan for CBC  to assess Hgb as well as repeat D-dimer which was last negative at this facility in December 2019. CXR also ordered.  4:52 AM Per RN, used insulin needle found on the floor in patient's room. She now has a systolic BP in the 90's. Daily medications reviewed on Care Everywhere. Does have documented hx of DM, but is on metformin only. Concern for possible self medication. Will add UDS. IVF ordered for BP management. Will monitor.  4:59 AM D dimer negative. No concern for PE at this time. Labs reassuring without leukocytosis. Plan for MRI w/w/o contrast with discharge if negative for infectious etiology. Her exam today is not concerning for cauda equina.  5:29 AM CXR without PNA, PTX, effusion. Suspect imaging to be limited due to patient's habitus and positioning. There is questionable vascular congestion. No hx of CHF. Will add BNP for this reason.  6:23 AM Patient transported to MRI. Care signed out to [place], PA-C at shift change pending remaining work up.   Final Clinical Impressions(s) / ED Diagnoses   Final diagnoses:  Acute exacerbation of chronic low back pain    ED Discharge Orders    None       Antony MaduraHumes, Tylek Boney, PA-C 10/25/19 [id number]    Palumbo, April, MD 10/25/19 [phone]

## 2019-10-25 NOTE — ED Notes (Signed)
Upon going into pts room for assessment, this nurse found an empty insulin syringe on the floor. Pt denied it being hers and stated that it was her boyfriends, pt has not had any visitors in the ED tonight and was on the phone with said boyfriend asking him to come and pick her up. Pt denies using it. EDP and EDAAP made aware. Needle and syringe secured safely in sharps box.

## 2019-10-29 ENCOUNTER — Emergency Department (HOSPITAL_COMMUNITY)
Admission: EM | Admit: 2019-10-29 | Discharge: 2019-10-30 | Disposition: A | Discharge status: Home / Self Care | Payer: Medicaid Other | Attending: Emergency Medicine | Admitting: Emergency Medicine

## 2019-10-29 ENCOUNTER — Other Ambulatory Visit: Payer: Self-pay

## 2019-10-29 ENCOUNTER — Encounter (HOSPITAL_COMMUNITY): Payer: Self-pay | Admitting: Emergency Medicine

## 2019-10-29 DIAGNOSIS — I82409 Acute embolism and thrombosis of unspecified deep veins of unspecified lower extremity: Secondary | ICD-10-CM | POA: Insufficient documentation

## 2019-10-29 DIAGNOSIS — I2699 Other pulmonary embolism without acute cor pulmonale: Secondary | ICD-10-CM | POA: Insufficient documentation

## 2019-10-29 DIAGNOSIS — M545 Low back pain, unspecified: Secondary | ICD-10-CM

## 2019-10-29 DIAGNOSIS — E119 Type 2 diabetes mellitus without complications: Secondary | ICD-10-CM | POA: Diagnosis not present

## 2019-10-29 DIAGNOSIS — J45909 Unspecified asthma, uncomplicated: Secondary | ICD-10-CM | POA: Diagnosis not present

## 2019-10-29 DIAGNOSIS — G8929 Other chronic pain: Secondary | ICD-10-CM | POA: Insufficient documentation

## 2019-10-29 LAB — COMPREHENSIVE METABOLIC PANEL
ALT: 19 U/L (ref 0–44)
AST: 20 U/L (ref 15–41)
Albumin: 3.2 g/dL — ABNORMAL LOW (ref 3.5–5.0)
Alkaline Phosphatase: 73 U/L (ref 38–126)
Anion gap: 9 (ref 5–15)
BUN: 7 mg/dL (ref 6–20)
CO2: 28 mmol/L (ref 22–32)
Calcium: 9.1 mg/dL (ref 8.9–10.3)
Chloride: 105 mmol/L (ref 98–111)
Creatinine, Ser: 0.78 mg/dL (ref 0.44–1.00)
GFR calc Af Amer: >60 mL/min (ref >60)
GFR calc non Af Amer: >60 mL/min (ref >60)
Glucose, Bld: 145 mg/dL — ABNORMAL HIGH (ref 70–99)
Potassium: 3.5 mmol/L (ref 3.5–5.1)
Sodium: 142 mmol/L (ref 135–145)
Total Bilirubin: 0.1 mg/dL — ABNORMAL LOW (ref 0.3–1.2)
Total Protein: 7.3 g/dL (ref 6.5–8.1)

## 2019-10-29 LAB — I-STAT BETA HCG BLOOD, ED (MC, WL, AP ONLY): I-stat hCG, quantitative: <5 m[IU]/mL (ref <5)

## 2019-10-29 LAB — CBC
HCT: 37.9 % (ref 36.0–46.0)
Hemoglobin: 11.6 g/dL — ABNORMAL LOW (ref 12.0–15.0)
MCH: 27.2 pg (ref 26.0–34.0)
MCHC: 30.6 g/dL (ref 30.0–36.0)
MCV: 88.8 fL (ref 80.0–100.0)
Platelets: 431 10*3/uL — ABNORMAL HIGH (ref 150–400)
RBC: 4.27 MIL/uL (ref 3.87–5.11)
RDW: 15.7 % — ABNORMAL HIGH (ref 11.5–15.5)
WBC: 7.7 10*3/uL (ref 4.0–10.5)
nRBC: 0 % (ref 0.0–0.2)

## 2019-10-29 LAB — LIPASE, BLOOD: Lipase: 21 U/L (ref 11–51)

## 2019-10-29 MED ORDER — SODIUM CHLORIDE 0.9% FLUSH
3.0000 mL | Freq: Once | INTRAVENOUS | Status: AC
  Administered 2019-10-30: 3 mL via INTRAVENOUS

## 2019-10-29 NOTE — ED Triage Notes (Signed)
Patient here with vomiting for the last few hours, vomiting blood.  She states that she is also having back pain.  She was seen earlier in the week at the pain clinic with no relief of her pain.  She is dizzy with the coughing.

## 2019-10-30 ENCOUNTER — Emergency Department (HOSPITAL_COMMUNITY): Payer: Medicaid Other

## 2019-10-30 LAB — URINALYSIS, ROUTINE W REFLEX MICROSCOPIC
Bilirubin Urine: NEGATIVE
Glucose, UA: NEGATIVE mg/dL
Hgb urine dipstick: NEGATIVE
Ketones, ur: NEGATIVE mg/dL
Nitrite: NEGATIVE
Protein, ur: NEGATIVE mg/dL
Specific Gravity, Urine: 1.023 (ref 1.005–1.030)
pH: 7 (ref 5.0–8.0)

## 2019-10-30 LAB — LACTIC ACID, PLASMA: Lactic Acid, Venous: 1.3 mmol/L (ref 0.5–1.9)

## 2019-10-30 IMAGING — MR MR THORACIC SPINE W/O CM
5 of 7 series · 27 of 48 positions shown · non-contrast
Comparison: None.

CLINICAL DATA: Chronic mid back pain.

EXAM:
MRI THORACIC SPINE WITHOUT CONTRAST
TECHNIQUE: Multiplanar, multisequence MR imaging of the thoracic spine was
performed. No intravenous contrast was administered.

[Series 18: T1 · sagittal · 6.0mm · 1.23mm/px · 1 of 9 slices shown (1 of 3)]
[im 1/9]
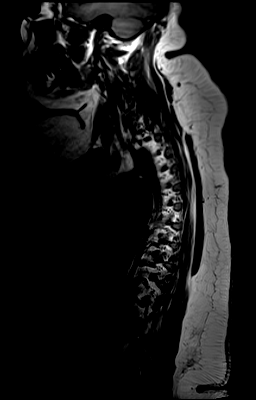

[Series 19: T2 · sagittal · 3.0mm · 0.71mm/px · 4 of 17 slices shown (1 of 2)]
[im 1/17]
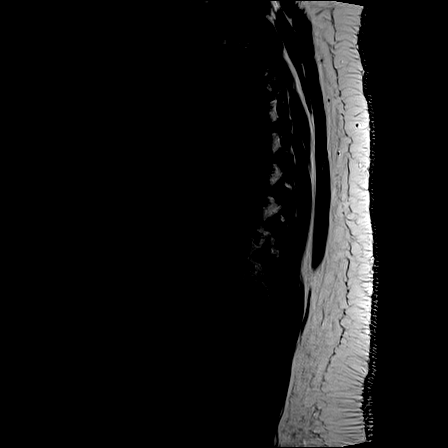
[im 6/17]
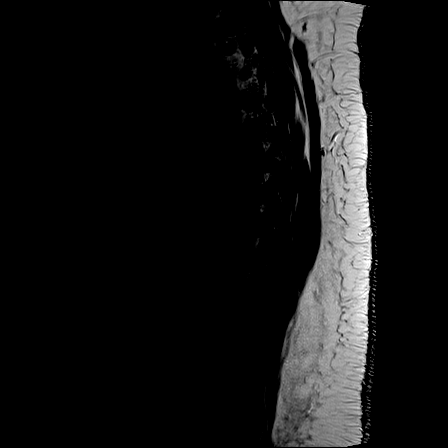
[im 11/17]
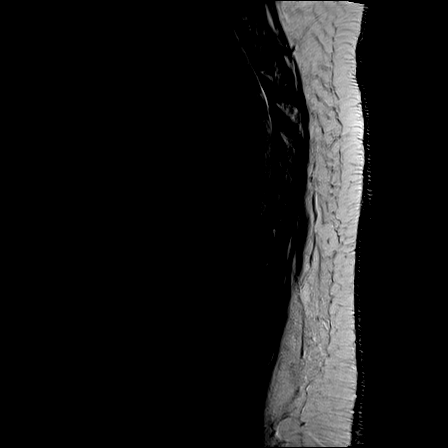
[im 17/17]
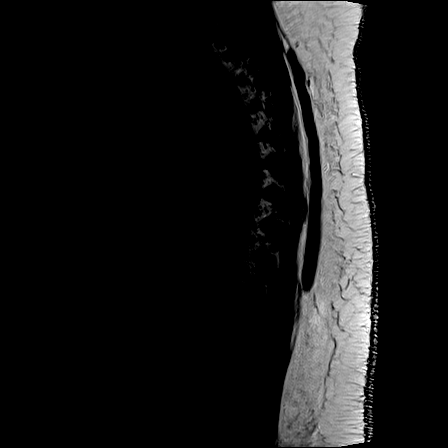

[Series 20: T1 · sagittal · 3.0mm · 0.71mm/px · 5 of 17 slices shown (2 of 3)]
[im 1/17]
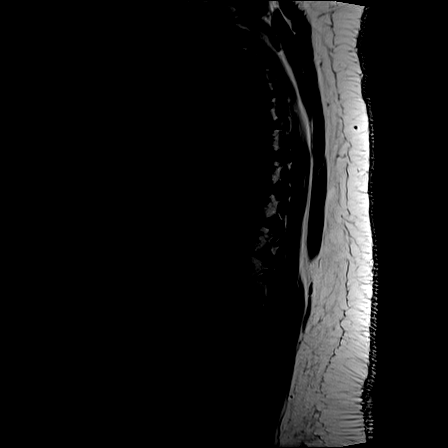
[im 5/17]
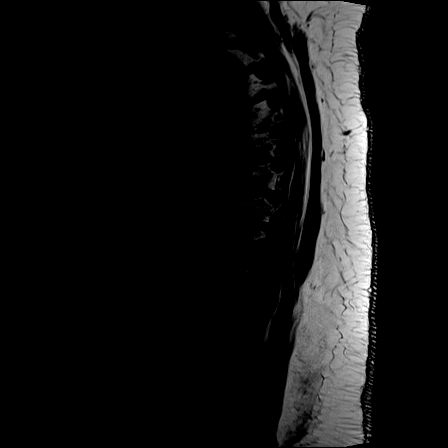
[im 9/17]
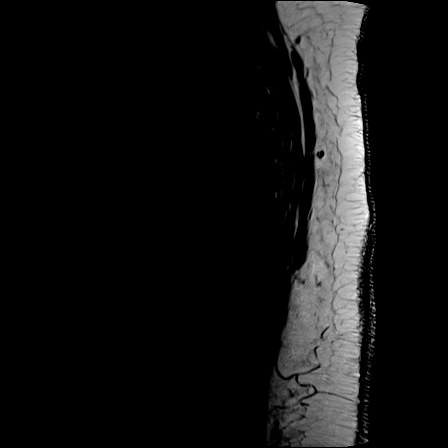
[im 13/17]
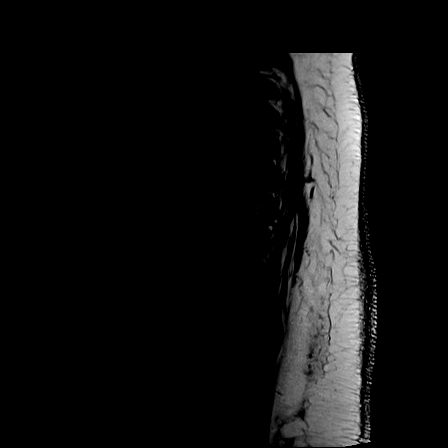
[im 17/17]
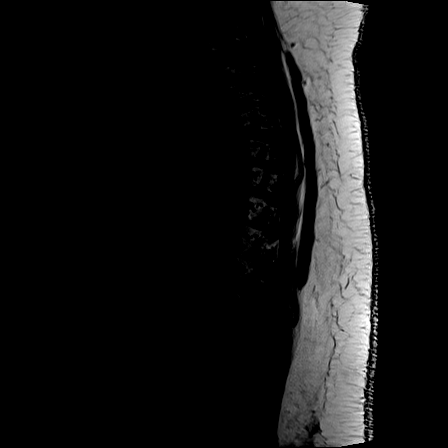

[Series 22: T2 · axial · 4.0mm · 0.59mm/px · z∈[-315,-117]mm · 11 of 39 slices shown (2 of 2)]
[im 1/39]
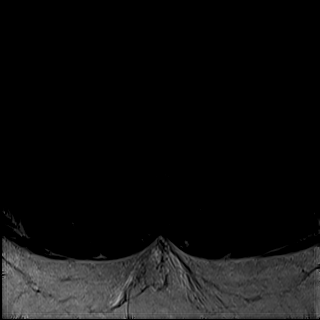
[im 4/39]
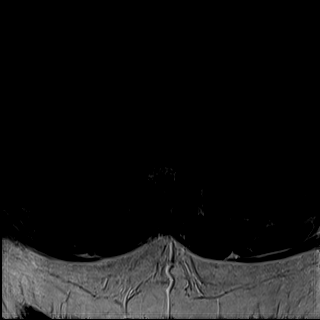
[im 8/39]
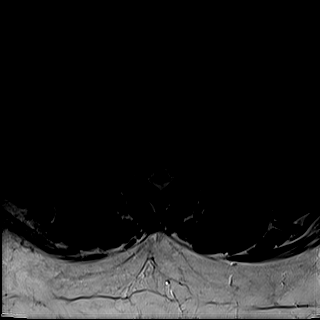
[im 12/39]
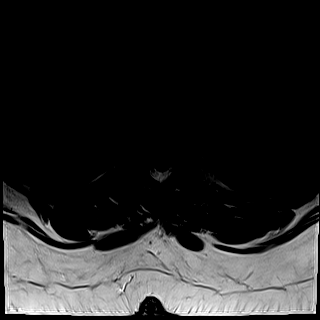
[im 16/39]
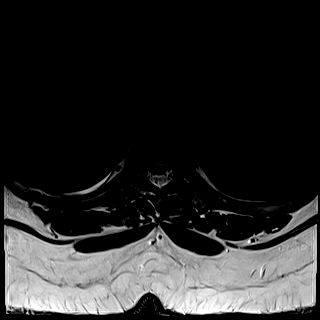
[im 20/39]
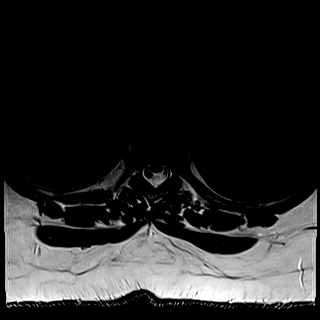
[im 23/39]
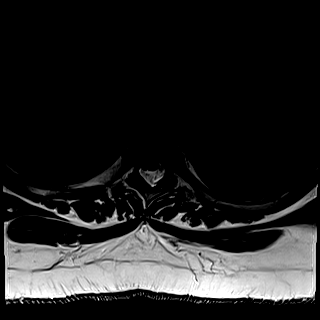
[im 27/39]
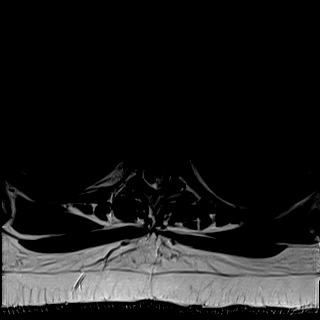
[im 31/39]
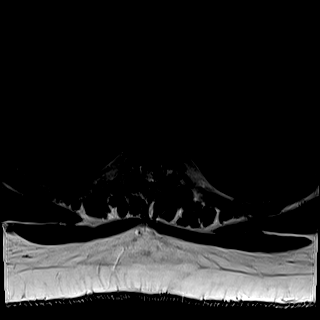
[im 35/39]
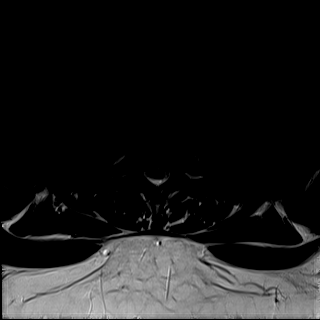
[im 39/39]
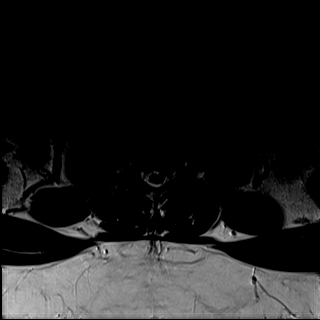

[Series 24: T1 · axial · non-contrast · 4.0mm · 0.31mm/px · z∈[-315,-160]mm · 6 of 39 slices shown (3 of 3)]
[im 1/39]
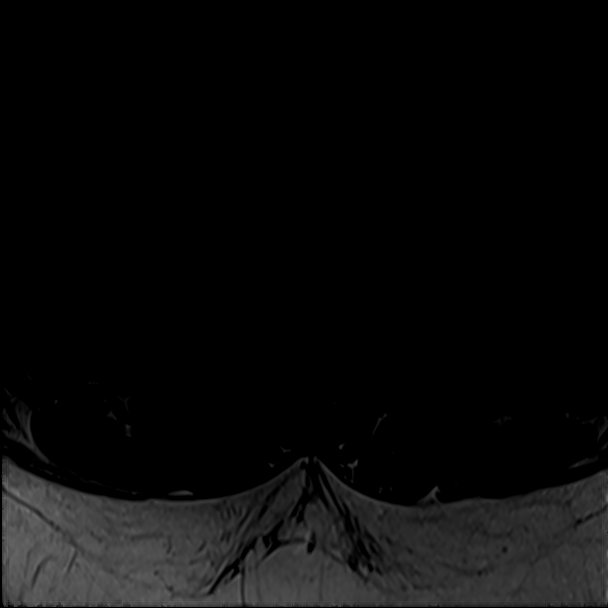
[im 8/39]
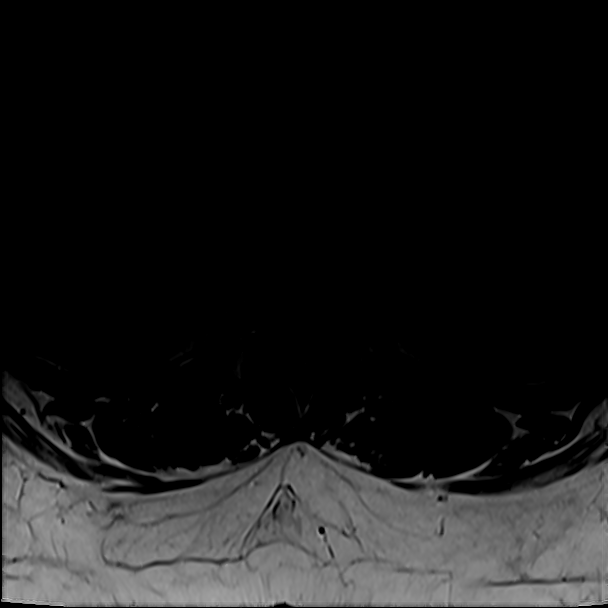
[im 12/39]
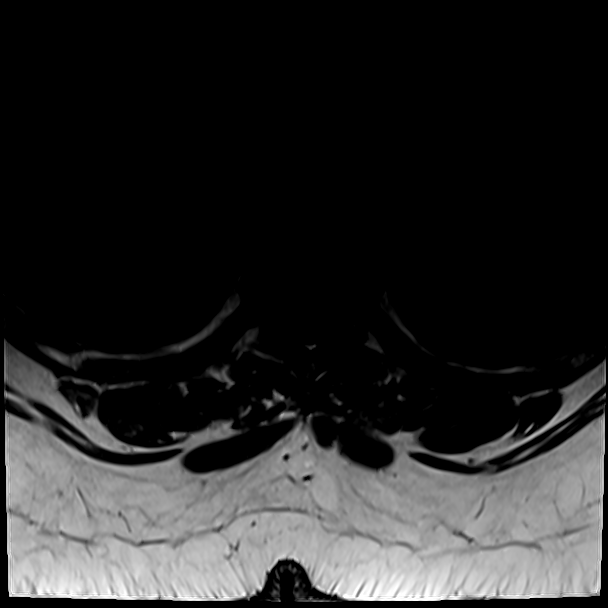
[im 16/39]
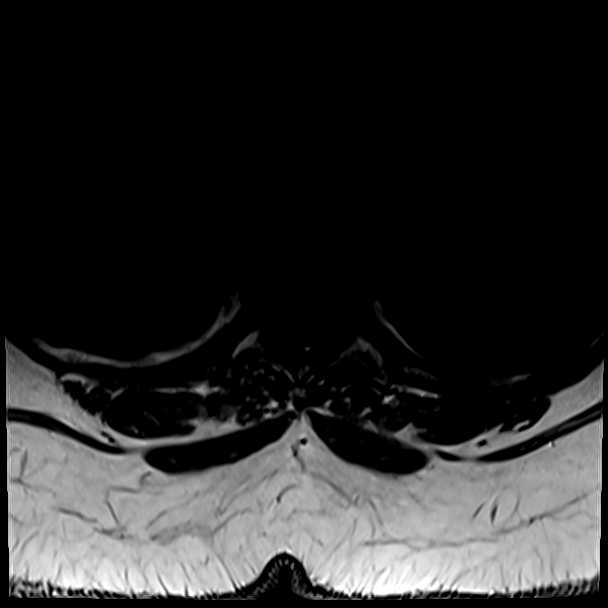
[im 23/39]
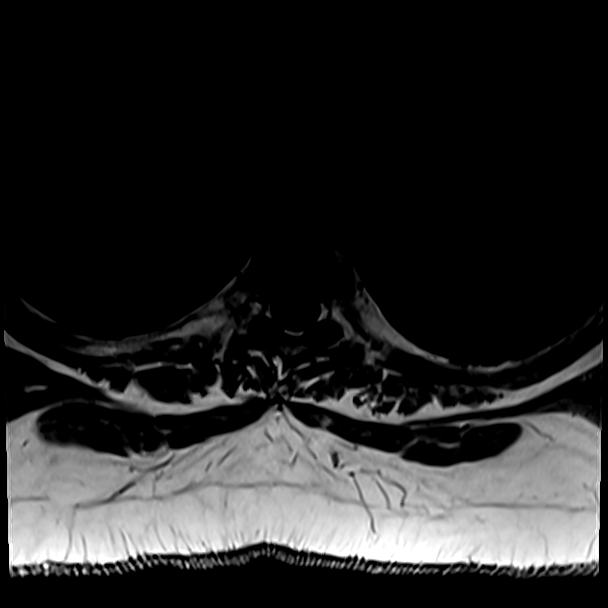
[im 27/39]
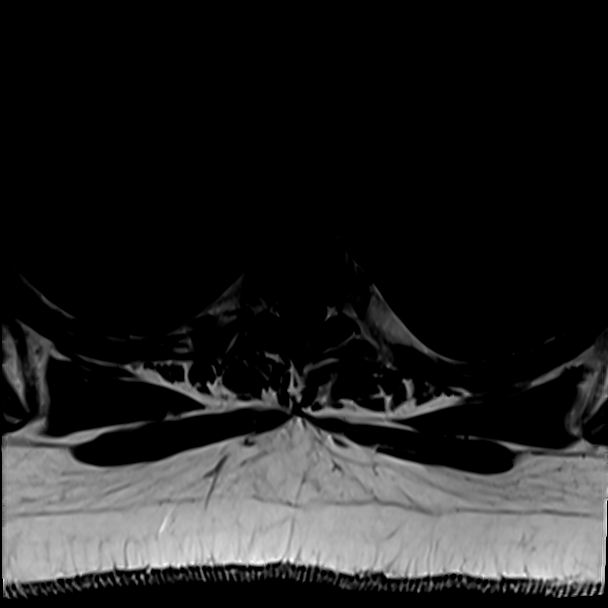

[27 of 48 positions shown; findings below may reference images not displayed]

FINDINGS: Alignment:  Negative for listhesis or detectable scoliosis.

Vertebrae: No fracture, evidence of discitis, or bone lesion.

Cord:  Normal signal and morphology.

Paraspinal and other soft tissues: Negative.

Disc levels:

Well preserved disc height and hydration. No noted facet spurring or
arthritis. No neural impingement.

Other: Motion degradation. Reportedly contrast was ordered but the
patient could not tolerate further imaging.
IMPRESSION: Negative motion degraded thoracic MRI.

## 2019-10-30 IMAGING — MR MR LUMBAR SPINE W/O CM
4 series · 33 of 48 positions shown · non-contrast
Comparison: None.

CLINICAL DATA: Back pain with abnormal neuro exam

EXAM:
MRI LUMBAR SPINE WITHOUT CONTRAST
TECHNIQUE: Multiplanar, multisequence MR imaging of the lumbar spine was
performed. No intravenous contrast was administered.

[Series 1: T2 · sagittal · 4.0mm · 0.73mm/px · 9 of 15 slices shown (1 of 2)]
[im 1/15]
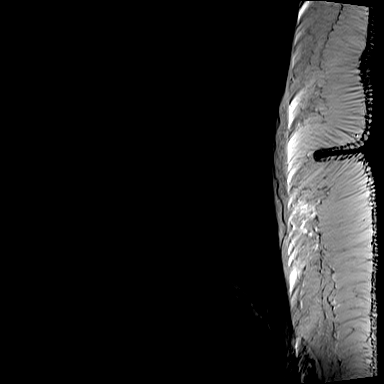
[im 3/15]
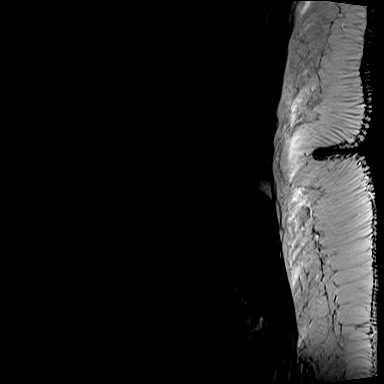
[im 4/15]
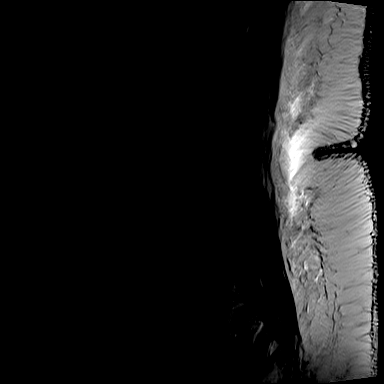
[im 7/15]
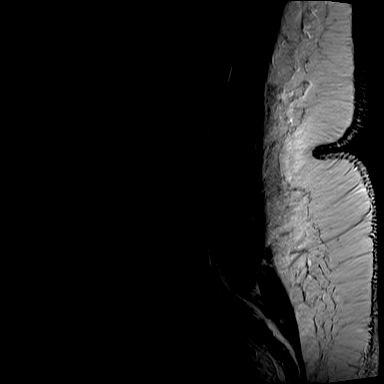
[im 8/15]
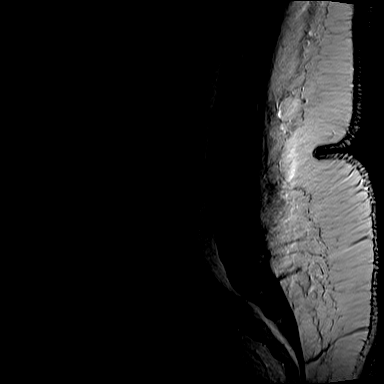
[im 11/15]
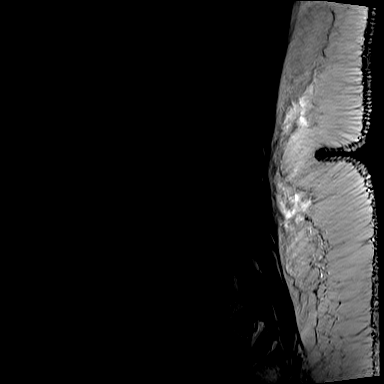
[im 12/15]
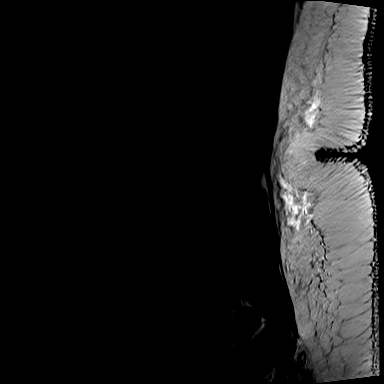
[im 13/15]
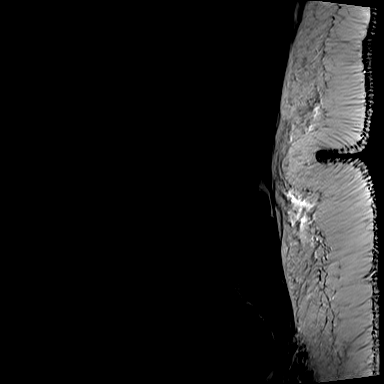
[im 15/15]
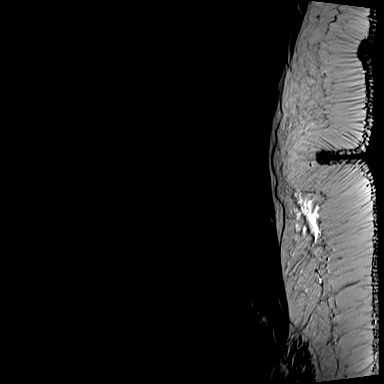

[Series 2: STIR · sagittal · 4.0mm · 0.55mm/px · 5 of 15 slices shown]
[im 1/15]
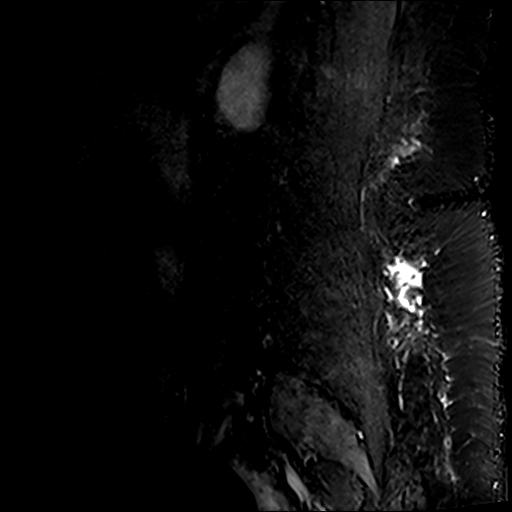
[im 2/15]
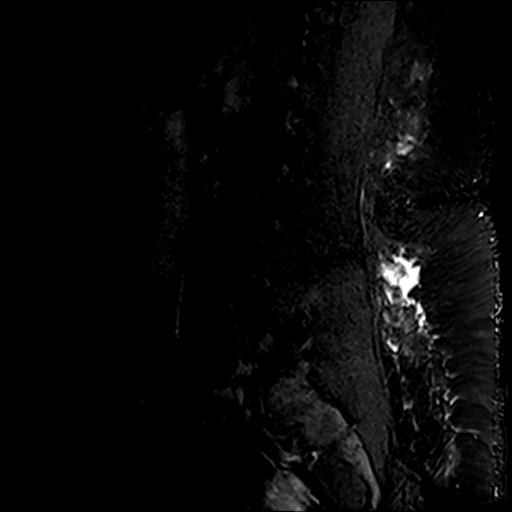
[im 3/15]
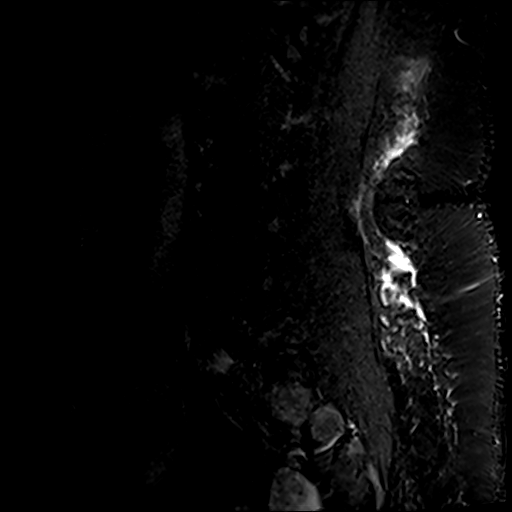
[im 8/15]
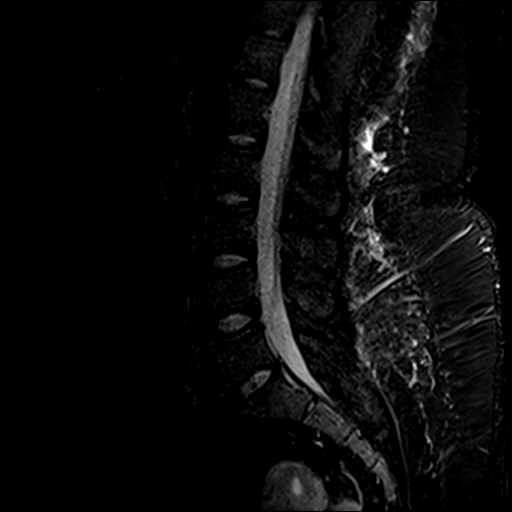
[im 13/15]
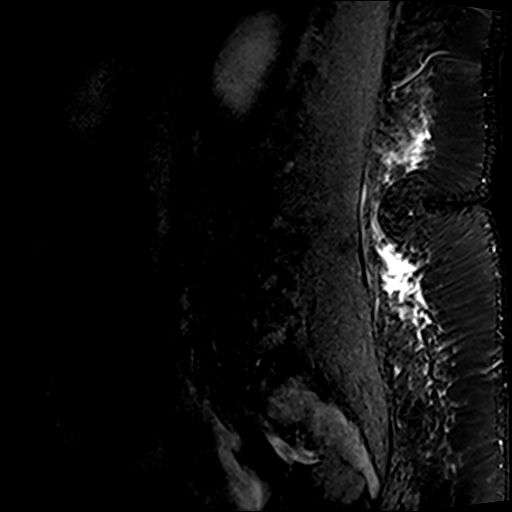

[Series 3: T1 · sagittal · 4.0mm · 0.88mm/px · 11 of 15 slices shown]
[im 1/15]
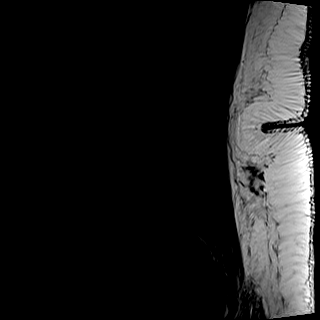
[im 2/15]
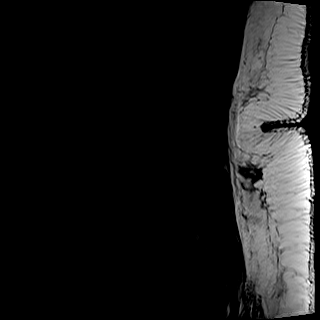
[im 3/15]
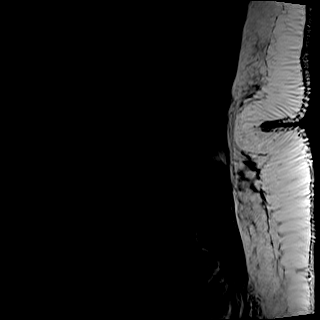
[im 5/15]
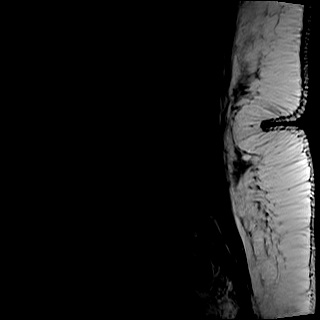
[im 6/15]
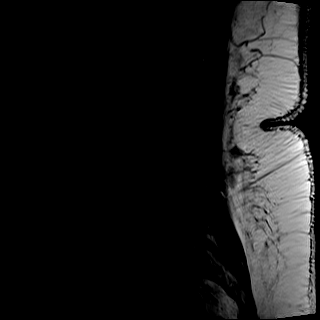
[im 8/15]
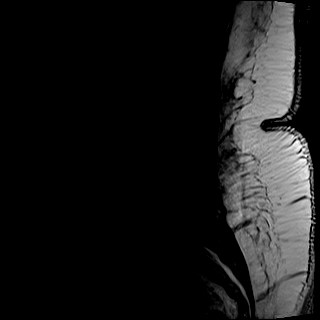
[im 9/15]
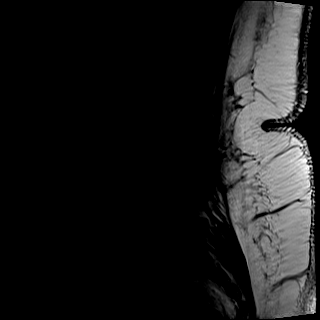
[im 10/15]
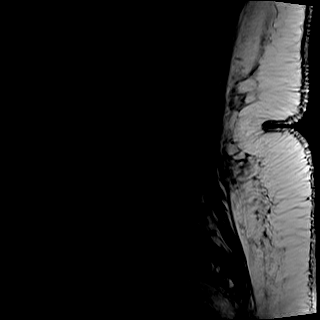
[im 12/15]
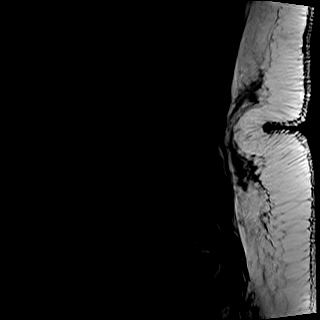
[im 13/15]
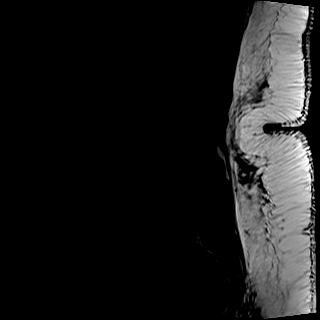
[im 15/15]
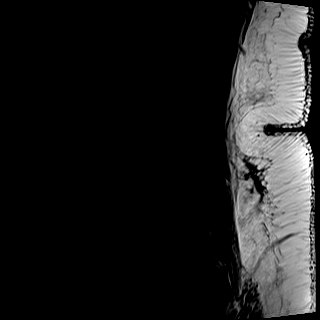

[Series 4: T2 · axial · 4.0mm · 0.57mm/px · z∈[-521,-310]mm · 8 of 18 slices shown (2 of 2)]
[im 1/18]
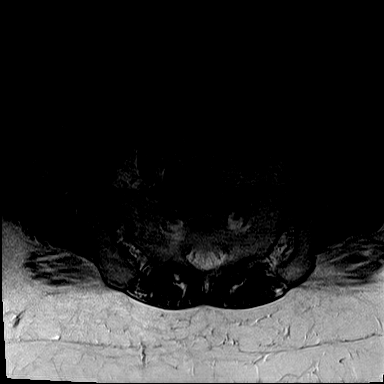
[im 3/18]
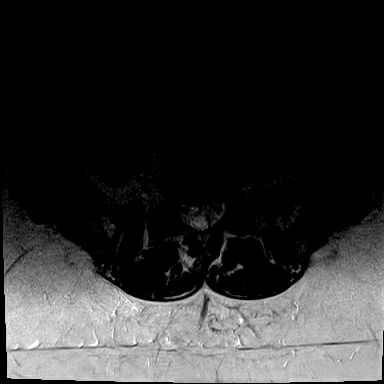
[im 6/18]
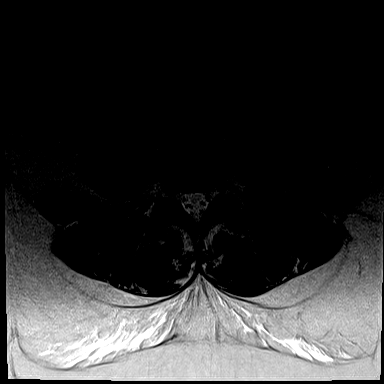
[im 8/18]
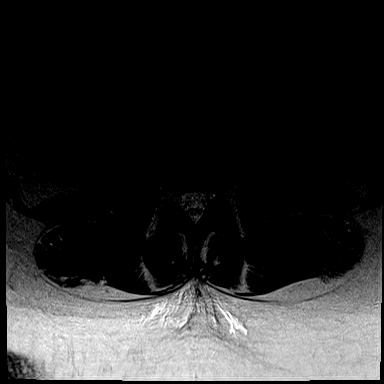
[im 10/18]
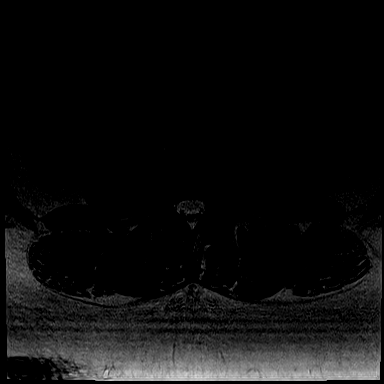
[im 12/18]
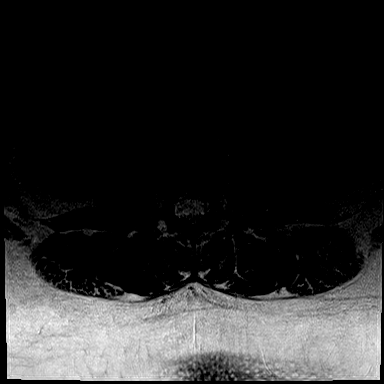
[im 15/18]
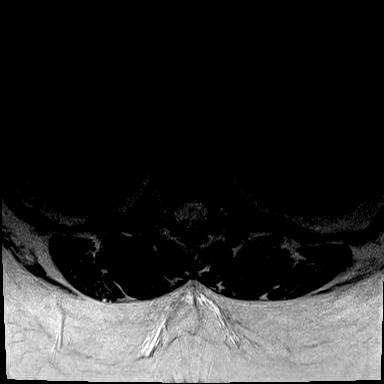
[im 18/18]
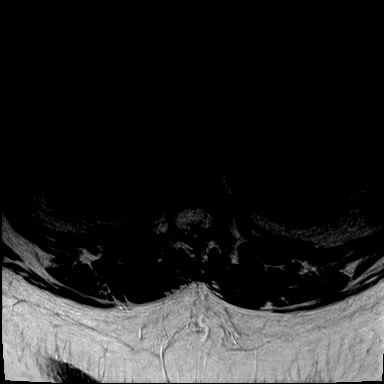

[33 of 48 positions shown; findings below may reference images not displayed]

FINDINGS: Segmentation:  Standard.

Alignment:  Physiologic.

Vertebrae:  No fracture, evidence of discitis, or bone lesion.

Conus medullaris and cauda equina: Conus extends to the L1 level.
Conus and cauda equina appear normal.

Paraspinal and other soft tissues: Negative

Disc levels:

Well preserved disc height and hydration. No facet spurring or
arthritis.

Other: Mild persistent motion degradation due to pain. Axial T1
images could not be acquired.
IMPRESSION: 1. Negative lumbar MRI.
2. Mild motion degradation and mild truncation of the study.

## 2019-10-30 MED ORDER — SODIUM CHLORIDE 0.9 % IV BOLUS
1000.0000 mL | Freq: Once | INTRAVENOUS | Status: AC
  Administered 2019-10-30: 1000 mL via INTRAVENOUS

## 2019-10-30 MED ORDER — FENTANYL CITRATE (PF) 100 MCG/2ML IJ SOLN
100.0000 ug | Freq: Once | INTRAMUSCULAR | Status: AC
  Administered 2019-10-30: 100 ug via INTRAVENOUS
  Filled 2019-10-30: qty 2

## 2019-10-30 MED ORDER — LIDOCAINE 5 % EX PTCH
1.0000 | MEDICATED_PATCH | CUTANEOUS | Status: DC
  Administered 2019-10-30: 1 via TRANSDERMAL
  Filled 2019-10-30: qty 1

## 2019-10-30 MED ORDER — ACETAMINOPHEN 500 MG PO TABS
1000.0000 mg | ORAL_TABLET | Freq: Once | ORAL | Status: AC
  Administered 2019-10-30: 1000 mg via ORAL
  Filled 2019-10-30: qty 2

## 2019-10-30 NOTE — Discharge Instructions (Addendum)
Please follow up with your pain doctor and neurosurgery

## 2019-10-30 NOTE — ED Provider Notes (Signed)
28 year old female with chronic pain syndrome and obesity presents with acute on chronic back pain, reported fever and urinary incontinence which is gradually worsening since receiving an injection in her back on 11/9 at [facility]. She is also having hemoptysis and hematemesis but has a normal d-dimer.   Plan is to obtain MRI lumbar and thoracic w/wo contrast initially until pt refused it. MRI was ultimately obtained w/o contrast.  MRI is motion degraded but grossly normal. Discussed results with patient. She is upset and crying. I don't feel that we can offer her much more here and per previous provider, giving narcotics would not be prudent. Will d/c. She was given f/u with neurosurgery.   Recardo Evangelist, PA-C 10/30/19 6314    Fredia Sorrow, MD 11/05/19 [phone]

## 2019-10-30 NOTE — ED Notes (Signed)
Patient verbalizes understanding of discharge instructions. Opportunity for questioning and answers were provided. Armband removed by staff, pt discharged from ED.  

## 2019-10-30 NOTE — ED Notes (Signed)
Patient transported to MRI 
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
## 2019-11-04 LAB — CULTURE, BLOOD (ROUTINE X 2)
Culture: NO GROWTH
Culture: NO GROWTH
Special Requests: ADEQUATE
Special Requests: ADEQUATE

## 2020-01-02 ENCOUNTER — Encounter (HOSPITAL_COMMUNITY): Payer: Self-pay | Admitting: Emergency Medicine

## 2020-01-02 ENCOUNTER — Telehealth (HOSPITAL_COMMUNITY): Payer: Self-pay | Admitting: Emergency Medicine

## 2020-01-02 ENCOUNTER — Emergency Department (HOSPITAL_COMMUNITY): Payer: Medicaid Other

## 2020-01-02 ENCOUNTER — Other Ambulatory Visit: Payer: Self-pay

## 2020-01-02 ENCOUNTER — Emergency Department (HOSPITAL_COMMUNITY)
Admission: EM | Admit: 2020-01-02 | Discharge: 2020-01-02 | Disposition: A | Discharge status: Home / Self Care | Payer: Medicaid Other | Attending: Emergency Medicine | Admitting: Emergency Medicine

## 2020-01-02 DIAGNOSIS — Y939 Activity, unspecified: Secondary | ICD-10-CM | POA: Diagnosis not present

## 2020-01-02 DIAGNOSIS — Y999 Unspecified external cause status: Secondary | ICD-10-CM | POA: Insufficient documentation

## 2020-01-02 DIAGNOSIS — I10 Essential (primary) hypertension: Secondary | ICD-10-CM | POA: Insufficient documentation

## 2020-01-02 DIAGNOSIS — W19XXXA Unspecified fall, initial encounter: Secondary | ICD-10-CM | POA: Diagnosis not present

## 2020-01-02 DIAGNOSIS — M25561 Pain in right knee: Secondary | ICD-10-CM | POA: Insufficient documentation

## 2020-01-02 DIAGNOSIS — E119 Type 2 diabetes mellitus without complications: Secondary | ICD-10-CM | POA: Diagnosis not present

## 2020-01-02 DIAGNOSIS — Z79899 Other long term (current) drug therapy: Secondary | ICD-10-CM | POA: Insufficient documentation

## 2020-01-02 DIAGNOSIS — J45909 Unspecified asthma, uncomplicated: Secondary | ICD-10-CM | POA: Diagnosis not present

## 2020-01-02 DIAGNOSIS — Y929 Unspecified place or not applicable: Secondary | ICD-10-CM | POA: Insufficient documentation

## 2020-01-02 MED ORDER — OXYCODONE HCL 5 MG PO TABS: 5.0000 mg | ORAL_TABLET | Freq: Four times a day (QID) | ORAL | 0 refills | Status: DC | PRN

## 2020-01-02 MED ORDER — OXYCODONE HCL 5 MG PO TABS
5.0000 mg | ORAL_TABLET | Freq: Once | ORAL | Status: AC
  Administered 2020-01-02: 5 mg via ORAL
  Filled 2020-01-02: qty 1

## 2020-01-02 MED FILL — oxyCODONE HCL 5 MG TABS: 5 | 5 days supply | Qty: 20 | Fill #0

## 2020-01-02 NOTE — ED Provider Notes (Signed)
[facility]-EMERGENCY DEPT Provider Note   CSN: [id number] Arrival date & time: 01/02/20  0439     History Chief Complaint  Patient presents with  . Knee Injury    Felicia Bradley is a 29 y.o. female.  The history is provided by the patient.  Knee Pain Location:  Knee Injury: yes   Knee location:  R knee Pain details:    Quality:  Aching and dull   Radiates to:  Does not radiate   Severity:  Mild   Onset quality:  Gradual   Timing:  Constant   Progression:  Unchanged Chronicity:  Recurrent Relieved by:  NSAIDs Worsened by:  Bearing weight Associated symptoms: no back pain, no decreased ROM, no fatigue, no fever, no itching, no muscle weakness, no neck pain, no numbness, no stiffness, no swelling and no tingling        Past Medical History:  Diagnosis Date  . Asthma   . Diabetes mellitus without complication (HCC)    type 2  . Hypertension   . Pulmonary embolism (HCC)     There are no problems to display for this patient.   Past Surgical History:  Procedure Laterality Date  . CESAREAN SECTION    . NO PAST SURGERIES       OB History    Gravida  2   Para      Term      Preterm      AB  2   Living  0     SAB  1   TAB  1   Ectopic      Multiple      Live Births              Family History  Problem Relation Age of Onset  . Diabetes Mother   . Asthma Mother   . Asthma Maternal Aunt   . Diabetes Maternal Aunt     Social History   Tobacco Use  . Smoking status: Never Smoker  . Smokeless tobacco: Never Used  Substance Use Topics  . Alcohol use: No  . Drug use: No    Home Medications Prior to Admission medications   Medication Sig Start Date End Date Taking? Authorizing Provider  acetaminophen (TYLENOL) 325 MG tablet Take 325 mg by mouth every 6 (six) hours as needed for moderate pain.    [provider]  albuterol (VENTOLIN HFA) 108 (90 Base) MCG/ACT inhaler Inhale 1 puff into the lungs every 6  (six) hours as needed for wheezing or shortness of breath.  11/10/14   [provider]  ALPRAZolam Prudy Feeler) 0.5 MG tablet Take 0.5 mg by mouth 3 (three) times daily. 05/17/19   [provider]  EPINEPHrine 0.3 mg/0.3 mL IJ SOAJ injection Inject 0.3 mg into the muscle as needed for anaphylaxis.  11/10/14   [provider]  ferrous sulfate 325 (65 FE) MG EC tablet Take 325 mg by mouth daily. 11/10/14   [provider]  gabapentin (NEURONTIN) 300 MG capsule Take 300 mg by mouth 3 (three) times daily. 05/15/19   [provider]  ipratropium-albuterol (DUONEB) 0.5-2.5 (3) MG/3ML SOLN Take 3 mLs by nebulization 3 (three) times daily.  07/19/19   [provider]  Multiple Vitamins-Minerals (MULTIVITAMIN ADULT PO) Take 1 tablet by mouth daily.    [provider]  oxyCODONE (ROXICODONE) 5 MG immediate release tablet Take 1 tablet (5 mg total) by mouth every 6 (six) hours as needed for up  to 20 doses for breakthrough pain. 01/02/20   Heron Pitcock, DO  traZODone (DESYREL) 100 MG tablet Take 150 mg by mouth at bedtime as needed for sleep.  05/16/19   [provider]    Allergies    Contrast media [iodinated diagnostic agents], Dilaudid [hydromorphone], Tramadol, Apple, Banana, Decadron [dexamethasone], Ketorolac, Other, Solu-medrol [methylprednisolone], and Toradol [ketorolac tromethamine]  Review of Systems   Review of Systems  Constitutional: Negative for fatigue and fever.  Musculoskeletal: Positive for arthralgias and gait problem. Negative for back pain, neck pain and stiffness.  Skin: Negative for itching.  Neurological: Negative for weakness and numbness.    Physical Exam Updated Vital Signs BP (!) 136/99 (BP Location: Right Arm)   Pulse 100   Temp 99.6 F (37.6 C) (Oral)   Resp 19   Ht 5\' 3"  (1.6 m)   Wt 123 kg   LMP 01/02/2020   SpO2 97%   BMI 48.03 kg/m   Physical Exam Vitals and nursing note reviewed.    Constitutional:      General: She is not in acute distress.    Appearance: She is well-developed.  HENT:     Head: Normocephalic and atraumatic.  Eyes:     Conjunctiva/sclera: Conjunctivae normal.  Cardiovascular:     Rate and Rhythm: Normal rate and regular rhythm.     Heart sounds: No murmur.  Pulmonary:     Effort: Pulmonary effort is normal. No respiratory distress.  Abdominal:     Palpations: Abdomen is soft.     Tenderness: There is no abdominal tenderness.  Musculoskeletal:        General: Tenderness (right knee) present. No swelling. Normal range of motion.     Cervical back: Neck supple.  Skin:    General: Skin is warm and dry.  Neurological:     Mental Status: She is alert.     Sensory: No sensory deficit.     Motor: No weakness.     ED Results / Procedures / Treatments   Labs (all labs ordered are listed, but only abnormal results are displayed) Labs Reviewed - No data to display  EKG None  Radiology DG Knee Complete 4 Views Right  Result Date: 01/02/2020 CLINICAL DATA:  Right knee injury.  Fall yesterday. EXAM: RIGHT KNEE - COMPLETE 4+ VIEW COMPARISON:  None. FINDINGS: No evidence of fracture, dislocation, or joint effusion. No evidence of arthropathy or other focal bone abnormality. Soft tissues are unremarkable. IMPRESSION: Negative right knee radiographs. Electronically Signed   By: 01/04/2020 M.D.   On: 01/02/2020 07:02    Procedures Procedures (including critical care time)  Medications Ordered in ED Medications  oxyCODONE (Oxy IR/ROXICODONE) immediate release tablet 5 mg (has no administration in time range)    ED Course  I have reviewed the triage vital signs and the nursing notes.  Pertinent labs & imaging results that were available during my care of the patient were reviewed by me and considered in my medical decision making (see chart for details).    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  Patient with normal vitals.  No fever.  X-ray of the right knee normal.  No joint effusion.  No fracture or dislocation.  Has tenderness in this area.  Possibly a mild knee sprain.  Likely a contusion.  Has issues with chronic pain at baseline due  to avascular necrosis of her left hip.  Possibly has same issues in the right side as well.  Follows with orthopedics and is scheduled to see chronic pain team.  This injury appears to have increased her overall chronic pain as well.  No active narcotic scripts.  We will give her oxycodone.  Recommend continued use of Tylenol, Motrin, ice at home.  Given crutches.  Will prescribe Roxicodone for short term pain control.  Patient neurovascularly intact.  Discharged in ED in good condition.  This chart was dictated using voice recognition software.  Despite best efforts to proofread,  errors can occur which can change the documentation meaning.    Final Clinical Impression(s) / ED Diagnoses Final diagnoses:  None    Rx / DC Orders ED Discharge Orders         Ordered    oxyCODONE (ROXICODONE) 5 MG immediate release tablet  Every 6 hours PRN     01/02/20 0900           Richele Strand, DO 01/02/20 0900

## 2020-01-02 NOTE — Telephone Encounter (Signed)
Narcotic pain medicine routed to new pharmacy per patient request.

## 2020-01-02 NOTE — Progress Notes (Signed)
Attempted call to speak to pt, she has Medicaid for meds. Copay should be $3. Will call pharmacy to follow up. Rx sent to [facility]. Isidoro Donning RN CCM, [facility] ED TOC CM [phone]

## 2020-01-02 NOTE — ED Triage Notes (Signed)
Pt reports having right knee pain that started yesterday after fall due to right knee giving out.

## 2020-01-10 ENCOUNTER — Telehealth: Payer: Self-pay | Admitting: *Deleted

## 2020-01-10 NOTE — Telephone Encounter (Signed)
TOC CM received call from pt about her appt to have a CT Scan today at [facility] at [place] at 2:45 pm. CM did not have any details on appt. Encouraged her to call clinic or MD that arranged appt. Isidoro Donning RN CCM, [facility] ED TOC CM [phone]

## 2020-01-13 ENCOUNTER — Emergency Department (HOSPITAL_COMMUNITY): Payer: Medicaid Other

## 2020-01-13 ENCOUNTER — Encounter (HOSPITAL_COMMUNITY): Payer: Self-pay

## 2020-01-13 ENCOUNTER — Inpatient Hospital Stay (HOSPITAL_COMMUNITY)
Admission: EM | Admit: 2020-01-13 | Discharge: 2020-01-16 | DRG: 202 | Disposition: A | Discharge status: Home / Self Care | Payer: Medicaid Other | Attending: Internal Medicine | Admitting: Internal Medicine

## 2020-01-13 DIAGNOSIS — Z825 Family history of asthma and other chronic lower respiratory diseases: Secondary | ICD-10-CM

## 2020-01-13 DIAGNOSIS — Z7951 Long term (current) use of inhaled steroids: Secondary | ICD-10-CM

## 2020-01-13 DIAGNOSIS — R0781 Pleurodynia: Secondary | ICD-10-CM | POA: Diagnosis present

## 2020-01-13 DIAGNOSIS — Z6841 Body Mass Index (BMI) 40.0 and over, adult: Secondary | ICD-10-CM

## 2020-01-13 DIAGNOSIS — D571 Sickle-cell disease without crisis: Secondary | ICD-10-CM | POA: Diagnosis present

## 2020-01-13 DIAGNOSIS — Z91041 Radiographic dye allergy status: Secondary | ICD-10-CM

## 2020-01-13 DIAGNOSIS — Z885 Allergy status to narcotic agent status: Secondary | ICD-10-CM

## 2020-01-13 DIAGNOSIS — Z91018 Allergy to other foods: Secondary | ICD-10-CM

## 2020-01-13 DIAGNOSIS — I10 Essential (primary) hypertension: Secondary | ICD-10-CM | POA: Diagnosis present

## 2020-01-13 DIAGNOSIS — J4551 Severe persistent asthma with (acute) exacerbation: Principal | ICD-10-CM | POA: Diagnosis present

## 2020-01-13 DIAGNOSIS — Z888 Allergy status to other drugs, medicaments and biological substances status: Secondary | ICD-10-CM

## 2020-01-13 DIAGNOSIS — Z20822 Contact with and (suspected) exposure to covid-19: Secondary | ICD-10-CM | POA: Diagnosis present

## 2020-01-13 DIAGNOSIS — R0603 Acute respiratory distress: Secondary | ICD-10-CM | POA: Diagnosis present

## 2020-01-13 DIAGNOSIS — E1122 Type 2 diabetes mellitus with diabetic chronic kidney disease: Secondary | ICD-10-CM

## 2020-01-13 DIAGNOSIS — J45901 Unspecified asthma with (acute) exacerbation: Secondary | ICD-10-CM | POA: Diagnosis present

## 2020-01-13 DIAGNOSIS — E119 Type 2 diabetes mellitus without complications: Secondary | ICD-10-CM | POA: Diagnosis present

## 2020-01-13 DIAGNOSIS — Z79899 Other long term (current) drug therapy: Secondary | ICD-10-CM

## 2020-01-13 DIAGNOSIS — G4733 Obstructive sleep apnea (adult) (pediatric): Secondary | ICD-10-CM

## 2020-01-13 DIAGNOSIS — Z833 Family history of diabetes mellitus: Secondary | ICD-10-CM

## 2020-01-13 DIAGNOSIS — Z86711 Personal history of pulmonary embolism: Secondary | ICD-10-CM

## 2020-01-13 DIAGNOSIS — Z8249 Family history of ischemic heart disease and other diseases of the circulatory system: Secondary | ICD-10-CM

## 2020-01-13 HISTORY — DX: Obesity, unspecified: E66.9

## 2020-01-13 LAB — CBC WITH DIFFERENTIAL/PLATELET
Abs Immature Granulocytes: 0.03 10*3/uL (ref 0.00–0.07)
Basophils Absolute: 0 10*3/uL (ref 0.0–0.1)
Basophils Relative: 1 %
Eosinophils Absolute: 0.1 10*3/uL (ref 0.0–0.5)
Eosinophils Relative: 1 %
HCT: 39.1 % (ref 36.0–46.0)
Hemoglobin: 12 g/dL (ref 12.0–15.0)
Immature Granulocytes: 0 %
Lymphocytes Relative: 42 %
Lymphs Abs: 3.6 10*3/uL (ref 0.7–4.0)
MCH: 27.1 pg (ref 26.0–34.0)
MCHC: 30.7 g/dL (ref 30.0–36.0)
MCV: 88.5 fL (ref 80.0–100.0)
Monocytes Absolute: 0.3 10*3/uL (ref 0.1–1.0)
Monocytes Relative: 4 %
Neutro Abs: 4.5 10*3/uL (ref 1.7–7.7)
Neutrophils Relative %: 52 %
Platelets: 462 10*3/uL — ABNORMAL HIGH (ref 150–400)
RBC: 4.42 MIL/uL (ref 3.87–5.11)
RDW: 15.4 % (ref 11.5–15.5)
WBC: 8.6 10*3/uL (ref 4.0–10.5)
nRBC: 0 % (ref 0.0–0.2)

## 2020-01-13 LAB — POC SARS CORONAVIRUS 2 AG -  ED: SARS Coronavirus 2 Ag: NEGATIVE

## 2020-01-13 LAB — LACTATE DEHYDROGENASE: LDH: 257 U/L — ABNORMAL HIGH (ref 98–192)

## 2020-01-13 LAB — TRIGLYCERIDES: Triglycerides: 167 mg/dL — ABNORMAL HIGH (ref <150)

## 2020-01-13 LAB — COMPREHENSIVE METABOLIC PANEL
ALT: 14 U/L (ref 0–44)
AST: 26 U/L (ref 15–41)
Albumin: 3.4 g/dL — ABNORMAL LOW (ref 3.5–5.0)
Alkaline Phosphatase: 71 U/L (ref 38–126)
Anion gap: 8 (ref 5–15)
BUN: 12 mg/dL (ref 6–20)
CO2: 26 mmol/L (ref 22–32)
Calcium: 9.4 mg/dL (ref 8.9–10.3)
Chloride: 105 mmol/L (ref 98–111)
Creatinine, Ser: 0.86 mg/dL (ref 0.44–1.00)
GFR calc Af Amer: >60 mL/min (ref >60)
GFR calc non Af Amer: >60 mL/min (ref >60)
Glucose, Bld: 109 mg/dL — ABNORMAL HIGH (ref 70–99)
Potassium: 4.6 mmol/L (ref 3.5–5.1)
Sodium: 139 mmol/L (ref 135–145)
Total Bilirubin: 0.6 mg/dL (ref 0.3–1.2)
Total Protein: 6.9 g/dL (ref 6.5–8.1)

## 2020-01-13 LAB — POCT I-STAT EG7
Acid-Base Excess: 4 mmol/L — ABNORMAL HIGH (ref 0.0–2.0)
Bicarbonate: 30.5 mmol/L — ABNORMAL HIGH (ref 20.0–28.0)
Calcium, Ion: 1.22 mmol/L (ref 1.15–1.40)
HCT: 37 % (ref 36.0–46.0)
Hemoglobin: 12.6 g/dL (ref 12.0–15.0)
O2 Saturation: 99 %
Potassium: 3.9 mmol/L (ref 3.5–5.1)
Sodium: 142 mmol/L (ref 135–145)
TCO2: 32 mmol/L (ref 22–32)
pCO2, Ven: 55.2 mmHg (ref 44.0–60.0)
pH, Ven: 7.35 (ref 7.250–7.430)
pO2, Ven: 156 mmHg — ABNORMAL HIGH (ref 32.0–45.0)

## 2020-01-13 LAB — LACTIC ACID, PLASMA: Lactic Acid, Venous: 1.6 mmol/L (ref 0.5–1.9)

## 2020-01-13 LAB — C-REACTIVE PROTEIN: CRP: 2.3 mg/dL — ABNORMAL HIGH (ref <1.0)

## 2020-01-13 LAB — RETICULOCYTES
Immature Retic Fract: 18.6 % — ABNORMAL HIGH (ref 2.3–15.9)
RBC.: 4.38 MIL/uL (ref 3.87–5.11)
Retic Count, Absolute: 81.5 10*3/uL (ref 19.0–186.0)
Retic Ct Pct: 1.9 % (ref 0.4–3.1)

## 2020-01-13 LAB — D-DIMER, QUANTITATIVE: D-Dimer, Quant: 0.29 ug/mL-FEU (ref 0.00–0.50)

## 2020-01-13 LAB — FIBRINOGEN: Fibrinogen: 532 mg/dL — ABNORMAL HIGH (ref 210–475)

## 2020-01-13 LAB — I-STAT BETA HCG BLOOD, ED (MC, WL, AP ONLY): I-stat hCG, quantitative: <5 m[IU]/mL (ref <5)

## 2020-01-13 LAB — FERRITIN: Ferritin: 19 ng/mL (ref 11–307)

## 2020-01-13 LAB — PROCALCITONIN: Procalcitonin: <0.1 ng/mL

## 2020-01-13 IMAGING — DX DG CHEST 1V PORT
1 series · 1 of 1 positions shown · non-contrast
Comparison: [DATE]

CLINICAL DATA: [3X] exposure.  Cough and shortness of breath.

EXAM:
PORTABLE CHEST 1 VIEW

[chest ap]
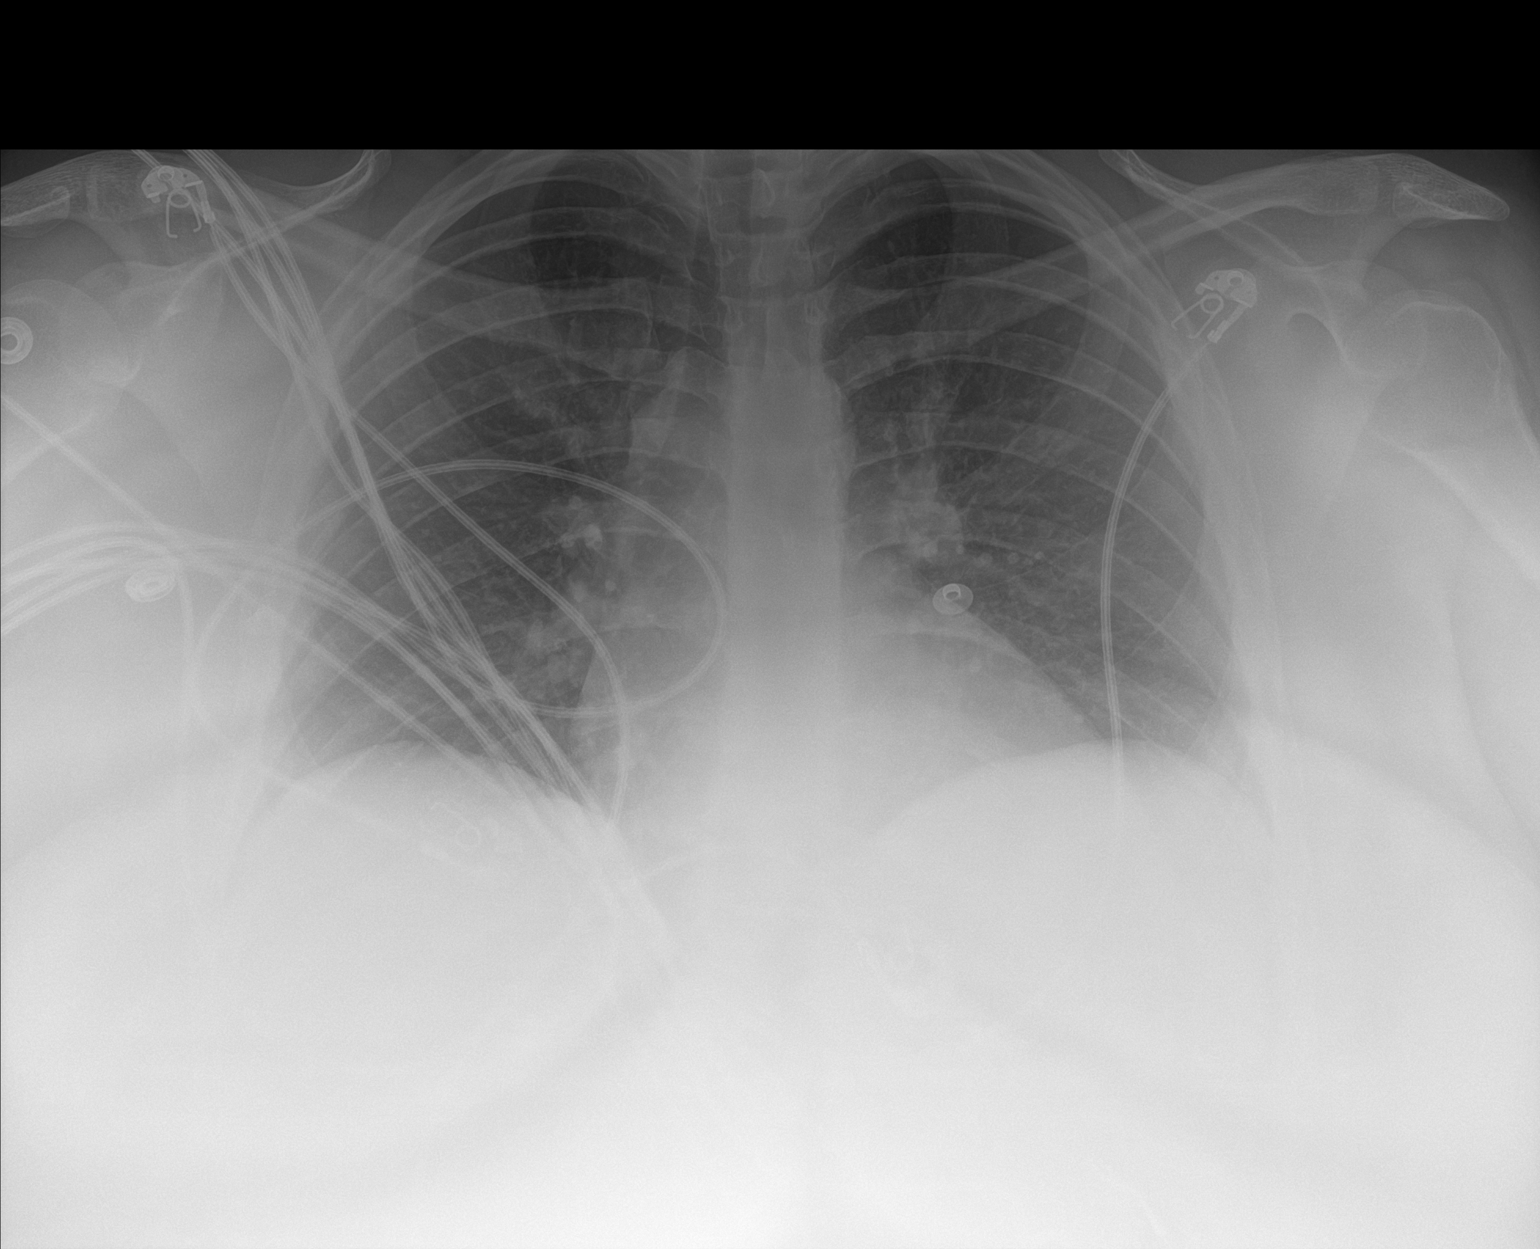

[1 of 1 positions shown; findings below may reference images not displayed]

FINDINGS: The heart size and mediastinal contours are within normal limits.
Both lungs are clear. The visualized skeletal structures are
unremarkable.
IMPRESSION: No active disease.

## 2020-01-13 MED ORDER — MAGNESIUM SULFATE 2 GM/50ML IV SOLN
2.0000 g | Freq: Once | INTRAVENOUS | Status: AC
  Administered 2020-01-13: 2 g via INTRAVENOUS
  Filled 2020-01-13: qty 50

## 2020-01-13 MED ORDER — AEROCHAMBER PLUS FLO-VU LARGE MISC: 1.0000 | Freq: Once | Status: DC

## 2020-01-13 MED ORDER — ALBUTEROL (5 MG/ML) CONTINUOUS INHALATION SOLN: 15.0000 mg | INHALATION_SOLUTION | RESPIRATORY_TRACT | Status: AC

## 2020-01-13 MED ORDER — SODIUM CHLORIDE 0.9 % IV SOLN
1000.0000 mL | INTRAVENOUS | Status: DC
  Administered 2020-01-13: 1000 mL via INTRAVENOUS

## 2020-01-13 MED ORDER — ALBUTEROL SULFATE HFA 108 (90 BASE) MCG/ACT IN AERS
8.0000 | INHALATION_SPRAY | Freq: Once | RESPIRATORY_TRACT | Status: AC
  Administered 2020-01-13: 8 via RESPIRATORY_TRACT
  Filled 2020-01-13: qty 6.7

## 2020-01-13 MED ORDER — DEXAMETHASONE SODIUM PHOSPHATE 10 MG/ML IJ SOLN
10.0000 mg | Freq: Once | INTRAMUSCULAR | Status: AC
  Administered 2020-01-13: 10 mg via INTRAVENOUS
  Filled 2020-01-13: qty 1

## 2020-01-13 MED ORDER — ACETAMINOPHEN 500 MG PO TABS
1000.0000 mg | ORAL_TABLET | Freq: Once | ORAL | Status: AC
  Administered 2020-01-13: 1000 mg via ORAL
  Filled 2020-01-13: qty 2

## 2020-01-13 MED ORDER — ALBUTEROL (5 MG/ML) CONTINUOUS INHALATION SOLN
INHALATION_SOLUTION | RESPIRATORY_TRACT | Status: AC
  Administered 2020-01-13: 15 mg via RESPIRATORY_TRACT
  Filled 2020-01-13: qty 20

## 2020-01-13 NOTE — ED Notes (Signed)
Pt states that she also has sickle cell

## 2020-01-13 NOTE — ED Triage Notes (Signed)
Pt reports some cough, shortness of breath, and generalized chest pain. Pt cohabitates with her boyfriend who tested positive for Covid-19 yesterday. Pt hx of asthma, COPD, bronchitis.

## 2020-01-13 NOTE — ED Provider Notes (Signed)
[facility] EMERGENCY DEPARTMENT Provider Note   CSN: [id number] Arrival date & time: 01/13/20  1946     History Chief Complaint  Patient presents with  . Shortness of Breath  . Cough    Felicia Bradley is a 29 y.o. female with a past medical history of asthma with reported 8 intubations, hypertension, DM 2, obesity, sickle cell anemia, PE not currently anticoagulated, who presents today for evaluation of shortness of breath. She reports that her significant other who she lives with her significant other who she lives with tested positive for Covid yesterday.   She reports 3 days of worsening shortness of breath and chest tightness.  She reports that initially her albuterol was helping, however it is no longer helping.  Has had 8 doses of albuterol today.   No fevers.  No N/V/D.  No HA  Of note she has Decadron listed as an allergy.  I asked her about the reaction and she states that "it feels like my pubic hair is on fire."  She denies getting any hives with Decadron.  She states that she vomits with IV Solu-Medrol.  HPI     Past Medical History:  Diagnosis Date  . Asthma   . Diabetes mellitus without complication (HCC)    type 2  . Hypertension   . Obesity   . Pulmonary embolism (HCC)   . Sickle cell anemia (HCC)     There are no problems to display for this patient.   Past Surgical History:  Procedure Laterality Date  . CESAREAN SECTION    . NO PAST SURGERIES       OB History    Gravida  2   Para      Term      Preterm      AB  2   Living  0     SAB  1   TAB  1   Ectopic      Multiple      Live Births              Family History  Problem Relation Age of Onset  . Diabetes Mother   . Asthma Mother   . Asthma Maternal Aunt   . Diabetes Maternal Aunt     Social History   Tobacco Use  . Smoking status: Never Smoker  . Smokeless tobacco: Never Used  Substance Use Topics  . Alcohol use: No  . Drug use: No     Home Medications Prior to Admission medications   Medication Sig Start Date End Date Taking? Authorizing Provider  acetaminophen (TYLENOL) 325 MG tablet Take 325 mg by mouth every 6 (six) hours as needed for moderate pain.   Yes [provider]  albuterol (VENTOLIN HFA) 108 (90 Base) MCG/ACT inhaler Inhale 1 puff into the lungs every 6 (six) hours as needed for wheezing or shortness of breath.  11/10/14  Yes [provider]  ALPRAZolam Prudy Feeler) 0.5 MG tablet Take 0.5 mg by mouth 3 (three) times daily. 05/17/19  Yes [provider]  EPINEPHrine 0.3 mg/0.3 mL IJ SOAJ injection Inject 0.3 mg into the muscle as needed for anaphylaxis.  11/10/14  Yes [provider]  ferrous sulfate 325 (65 FE) MG EC tablet Take 325 mg by mouth daily. 11/10/14  Yes [provider]  gabapentin (NEURONTIN) 300 MG capsule Take 300 mg by mouth 3 (three) times daily. 05/15/19  Yes [provider]  ipratropium-albuterol (DUONEB) 0.5-2.5 (3) MG/3ML  SOLN Take 3 mLs by nebulization 3 (three) times daily.  07/19/19  Yes [provider]  Multiple Vitamins-Minerals (MULTIVITAMIN ADULT PO) Take 1 tablet by mouth daily.   Yes [provider]  oxyCODONE (ROXICODONE) 5 MG immediate release tablet Take 1 tablet (5 mg total) by mouth every 6 (six) hours as needed for up to 20 doses for breakthrough pain. 01/02/20  Yes Curatolo, Adam, DO  traZODone (DESYREL) 100 MG tablet Take 150 mg by mouth at bedtime as needed for sleep.  05/16/19  Yes [provider]    Allergies    Contrast media [iodinated diagnostic agents], Dilaudid [hydromorphone], Tramadol, Apple, Banana, Decadron [dexamethasone], Ketorolac, Other, Solu-medrol [methylprednisolone], and Toradol [ketorolac tromethamine]  Review of Systems   Review of Systems  Constitutional: Positive for fatigue. Negative for chills and fever.  Respiratory: Positive for cough, chest tightness, shortness of breath and  wheezing.   Cardiovascular: Positive for chest pain. Negative for palpitations and leg swelling.  Gastrointestinal: Negative for abdominal pain, diarrhea, nausea and vomiting.  Genitourinary: Negative for dysuria.  Skin: Negative for color change and rash.  Neurological: Negative for weakness and headaches.  All other systems reviewed and are negative.   Physical Exam Updated Vital Signs BP 136/80   Pulse 93   Temp 100 F (37.8 C) (Oral)   Resp 19   Ht 5\' 3"  (1.6 m)   Wt (!) 145.2 kg   LMP 01/02/2020   SpO2 93%   BMI 56.69 kg/m   Physical Exam Vitals and nursing note reviewed.  Constitutional:      Appearance: She is well-developed. She is obese. She is ill-appearing.  HENT:     Head: Normocephalic and atraumatic.  Eyes:     Conjunctiva/sclera: Conjunctivae normal.  Cardiovascular:     Rate and Rhythm: Normal rate and regular rhythm.     Heart sounds: No murmur.  Pulmonary:     Comments: 3-4 word dyspnea on initial exam with increased work of breathing and mild tachypnea.  Diffusely decreased breath sounds bilaterally with decreased air movement and faint wheezing. Chest:     Chest wall: No deformity.  Abdominal:     Palpations: Abdomen is soft.     Tenderness: There is no abdominal tenderness.  Musculoskeletal:     Cervical back: Neck supple.     Right lower leg: No tenderness.     Left lower leg: No tenderness.  Skin:    General: Skin is warm and dry.  Neurological:     General: No focal deficit present.     Mental Status: She is alert and oriented to person, place, and time.  Psychiatric:        Mood and Affect: Mood normal.        Behavior: Behavior normal.     ED Results / Procedures / Treatments   Labs (all labs ordered are listed, but only abnormal results are displayed) Labs Reviewed  COMPREHENSIVE METABOLIC PANEL - Abnormal; Notable for the following components:      Result Value   Glucose, Bld 109 (*)    Albumin 3.4 (*)    All other components  within normal limits  CBC WITH DIFFERENTIAL/PLATELET - Abnormal; Notable for the following components:   Platelets 462 (*)    All other components within normal limits  TRIGLYCERIDES - Abnormal; Notable for the following components:   Triglycerides 167 (*)    All other components within normal limits  FIBRINOGEN - Abnormal; Notable for the following components:  Fibrinogen 532 (*)    All other components within normal limits  C-REACTIVE PROTEIN - Abnormal; Notable for the following components:   CRP 2.3 (*)    All other components within normal limits  RETICULOCYTES - Abnormal; Notable for the following components:   Immature Retic Fract 18.6 (*)    All other components within normal limits  LACTATE DEHYDROGENASE - Abnormal; Notable for the following components:   LDH 257 (*)    All other components within normal limits  POCT I-STAT EG7 - Abnormal; Notable for the following components:   pO2, Ven 156.0 (*)    Bicarbonate 30.5 (*)    Acid-Base Excess 4.0 (*)    All other components within normal limits  CULTURE, BLOOD (ROUTINE X 2)  CULTURE, BLOOD (ROUTINE X 2)  RESPIRATORY PANEL BY RT PCR (FLU A&B, COVID)  LACTIC ACID, PLASMA  D-DIMER, QUANTITATIVE (NOT AT [facility])  PROCALCITONIN  FERRITIN  LACTIC ACID, PLASMA  I-STAT BETA HCG BLOOD, ED ([facility], [facility], [facility] ONLY)  POC SARS CORONAVIRUS 2 AG -  ED  I-STAT VENOUS BLOOD GAS, ED    EKG None  Radiology DG Chest Port 1 View  Result Date: 01/13/2020 CLINICAL DATA:  COVID-19 exposure.  Cough and shortness of breath. EXAM: PORTABLE CHEST 1 VIEW COMPARISON:  October 25, 2019 FINDINGS: The heart size and mediastinal contours are within normal limits. Both lungs are clear. The visualized skeletal structures are unremarkable. IMPRESSION: No active disease. Electronically Signed   By: Gerome Sam III M.D   On: 01/13/2020 20:46    Procedures .Critical Care Performed by: Cristina Gong, PA-C Authorized by: Cristina Gong, PA-C     Critical care provider statement:    Critical care time (minutes):  45   Critical care was time spent personally by me on the following activities:  Discussions with consultants, evaluation of patient's response to treatment, examination of patient, ordering and performing treatments and interventions, ordering and review of laboratory studies, ordering and review of radiographic studies, pulse oximetry, re-evaluation of patient's condition, obtaining history from patient or surrogate and review of old charts   (including critical care time)  Medications Ordered in ED Medications  0.9 %  sodium chloride infusion (1,000 mLs Intravenous New Bag/Given 01/13/20 2116)  AeroChamber Plus Flo-Vu Large MISC 1 each (1 each Other Not Given 01/13/20 2112)  albuterol (PROVENTIL,VENTOLIN) solution continuous neb (15 mg Nebulization New Bag/Given 01/13/20 2129)  magnesium sulfate IVPB 2 g 50 mL (0 g Intravenous Stopped 01/13/20 2239)  dexamethasone (DECADRON) injection 10 mg (10 mg Intravenous Given 01/13/20 2106)  albuterol (VENTOLIN HFA) 108 (90 Base) MCG/ACT inhaler 8 puff (8 puffs Inhalation Given 01/13/20 2105)  acetaminophen (TYLENOL) tablet 1,000 mg (1,000 mg Oral Given 01/13/20 2319)    ED Course  I have reviewed the triage vital signs and the nursing notes.  Pertinent labs & imaging results that were available during my care of the patient were reviewed by me and considered in my medical decision making (see chart for details).  Clinical Course as of Jan 12 2357  Sat Jan 13, 2020  2311 Patient reevaluated she is moving more air than before.  She is tachycardic and jittery which I suspect is secondary to her completing an hour-long neb.  She wishes to come off BiPAP to eat some ice.  Message relayed to her nurse who will call respiratory.  Will admit patient.   [EH]    Clinical Course User Index [EH] Cristina Gong, PA-C  MDM Rules/Calculators/A&P                     Patient is a  29 year old woman with a past medical history of sickle cell, asthma requiring multiple intubations, who presents today for evaluation of shortness of breath.  She has a known Covid positive contact with her significant other whom she lives with. On initial exam she had significantly decreased lung sounds bilaterally with faint end expiratory wheezes.  She was treated with albuterol, IV magnesium, IV Decadron with mild improvement, however based on her history and the severity of her exacerbation she was placed on BiPAP and started on a continuous hour-long treatment.  CBC and CMP without significant hematologic or electrolyte derangements.  Lactic acid is not significantly elevated at 1.6.  Reticulocytes are elevated at 11.6.  She is not significantly acidotic.  Pregnancy test is negative.  Covid antigen test is negative.  Chest x-ray without evidence of acute abnormality.  After interventions patient had still significant wheezing and shortness of breath however her overall condition improved and she was no longer having 2-3 word dyspnea.  This patient was seen as a shared visit with Dr. Rex Kras.  I spoke with admitting team who will see the patient for admission.   Felicia Bradley was evaluated in Emergency Department on 01/13/2020 for the symptoms described in the history of present illness. She was evaluated in the context of the global COVID-19 pandemic, which necessitated consideration that the patient might be at risk for infection with the SARS-CoV-2 virus that causes COVID-19. Institutional protocols and algorithms that pertain to the evaluation of patients at risk for COVID-19 are in a state of rapid change based on information released by regulatory bodies including the CDC and federal and state organizations. These policies and algorithms were followed during the patient's care in the ED.   Final Clinical Impression(s) / ED Diagnoses Final diagnoses:  Suspected COVID-19 virus infection    Severe persistent asthma with exacerbation    Rx / DC Orders ED Discharge Orders    None       Lorin Glass, PA-C 01/13/20 2359    Little, Wenda Overland, MD 01/14/20 1555

## 2020-01-14 DIAGNOSIS — Z888 Allergy status to other drugs, medicaments and biological substances status: Secondary | ICD-10-CM

## 2020-01-14 DIAGNOSIS — J45901 Unspecified asthma with (acute) exacerbation: Secondary | ICD-10-CM

## 2020-01-14 DIAGNOSIS — Z86711 Personal history of pulmonary embolism: Secondary | ICD-10-CM | POA: Diagnosis not present

## 2020-01-14 DIAGNOSIS — E1122 Type 2 diabetes mellitus with diabetic chronic kidney disease: Secondary | ICD-10-CM

## 2020-01-14 DIAGNOSIS — R0603 Acute respiratory distress: Secondary | ICD-10-CM | POA: Diagnosis present

## 2020-01-14 DIAGNOSIS — I10 Essential (primary) hypertension: Secondary | ICD-10-CM | POA: Diagnosis present

## 2020-01-14 DIAGNOSIS — Z6841 Body Mass Index (BMI) 40.0 and over, adult: Secondary | ICD-10-CM | POA: Diagnosis not present

## 2020-01-14 DIAGNOSIS — Z885 Allergy status to narcotic agent status: Secondary | ICD-10-CM

## 2020-01-14 DIAGNOSIS — Z91018 Allergy to other foods: Secondary | ICD-10-CM | POA: Diagnosis not present

## 2020-01-14 DIAGNOSIS — G8929 Other chronic pain: Secondary | ICD-10-CM | POA: Diagnosis not present

## 2020-01-14 DIAGNOSIS — D571 Sickle-cell disease without crisis: Secondary | ICD-10-CM | POA: Diagnosis present

## 2020-01-14 DIAGNOSIS — G4733 Obstructive sleep apnea (adult) (pediatric): Secondary | ICD-10-CM

## 2020-01-14 DIAGNOSIS — Z825 Family history of asthma and other chronic lower respiratory diseases: Secondary | ICD-10-CM | POA: Diagnosis not present

## 2020-01-14 DIAGNOSIS — E119 Type 2 diabetes mellitus without complications: Secondary | ICD-10-CM

## 2020-01-14 DIAGNOSIS — Z20822 Contact with and (suspected) exposure to covid-19: Secondary | ICD-10-CM

## 2020-01-14 DIAGNOSIS — Z8249 Family history of ischemic heart disease and other diseases of the circulatory system: Secondary | ICD-10-CM | POA: Diagnosis not present

## 2020-01-14 DIAGNOSIS — Z7951 Long term (current) use of inhaled steroids: Secondary | ICD-10-CM | POA: Diagnosis not present

## 2020-01-14 DIAGNOSIS — R0781 Pleurodynia: Secondary | ICD-10-CM | POA: Diagnosis present

## 2020-01-14 DIAGNOSIS — Z91041 Radiographic dye allergy status: Secondary | ICD-10-CM

## 2020-01-14 DIAGNOSIS — J4551 Severe persistent asthma with (acute) exacerbation: Secondary | ICD-10-CM | POA: Diagnosis present

## 2020-01-14 DIAGNOSIS — E669 Obesity, unspecified: Secondary | ICD-10-CM

## 2020-01-14 DIAGNOSIS — Z833 Family history of diabetes mellitus: Secondary | ICD-10-CM | POA: Diagnosis not present

## 2020-01-14 DIAGNOSIS — Z79899 Other long term (current) drug therapy: Secondary | ICD-10-CM | POA: Diagnosis not present

## 2020-01-14 DIAGNOSIS — Z79891 Long term (current) use of opiate analgesic: Secondary | ICD-10-CM

## 2020-01-14 LAB — BASIC METABOLIC PANEL
Anion gap: 17 — ABNORMAL HIGH (ref 5–15)
BUN: 11 mg/dL (ref 6–20)
CO2: 18 mmol/L — ABNORMAL LOW (ref 22–32)
Calcium: 9.1 mg/dL (ref 8.9–10.3)
Chloride: 103 mmol/L (ref 98–111)
Creatinine, Ser: 0.9 mg/dL (ref 0.44–1.00)
GFR calc Af Amer: >60 mL/min (ref >60)
GFR calc non Af Amer: >60 mL/min (ref >60)
Glucose, Bld: 249 mg/dL — ABNORMAL HIGH (ref 70–99)
Potassium: 4.1 mmol/L (ref 3.5–5.1)
Sodium: 138 mmol/L (ref 135–145)

## 2020-01-14 LAB — CBG MONITORING, ED: Glucose-Capillary: 235 mg/dL — ABNORMAL HIGH (ref 70–99)

## 2020-01-14 LAB — GLUCOSE, CAPILLARY
Glucose-Capillary: 234 mg/dL — ABNORMAL HIGH (ref 70–99)
Glucose-Capillary: 164 mg/dL — ABNORMAL HIGH (ref 70–99)
Glucose-Capillary: 124 mg/dL — ABNORMAL HIGH (ref 70–99)
Glucose-Capillary: 185 mg/dL — ABNORMAL HIGH (ref 70–99)

## 2020-01-14 LAB — RESPIRATORY PANEL BY PCR

## 2020-01-14 LAB — RESPIRATORY PANEL BY RT PCR (FLU A&B, COVID)
Influenza A by PCR: NEGATIVE
Influenza B by PCR: NEGATIVE
SARS Coronavirus 2 by RT PCR: NEGATIVE

## 2020-01-14 LAB — HEMOGLOBIN A1C
Hgb A1c MFr Bld: 6.2 % — ABNORMAL HIGH (ref 4.8–5.6)
Mean Plasma Glucose: 131.24 mg/dL

## 2020-01-14 LAB — HIV ANTIBODY (ROUTINE TESTING W REFLEX): HIV Screen 4th Generation wRfx: NONREACTIVE

## 2020-01-14 LAB — SARS CORONAVIRUS 2 (TAT 6-24 HRS): SARS Coronavirus 2: NEGATIVE

## 2020-01-14 LAB — MRSA PCR SCREENING: MRSA by PCR: NEGATIVE

## 2020-01-14 MED ORDER — FERROUS SULFATE 325 (65 FE) MG PO TABS
325.0000 mg | ORAL_TABLET | Freq: Every day | ORAL | Status: DC
  Administered 2020-01-15 – 2020-01-16 (×2): 325 mg via ORAL
  Filled 2020-01-14 (×2): qty 1

## 2020-01-14 MED ORDER — ONDANSETRON HCL 4 MG PO TABS: 4.0000 mg | ORAL_TABLET | Freq: Four times a day (QID) | ORAL | Status: DC | PRN

## 2020-01-14 MED ORDER — KETOROLAC TROMETHAMINE 15 MG/ML IJ SOLN: 15.0000 mg | Freq: Once | INTRAMUSCULAR | Status: DC

## 2020-01-14 MED ORDER — ACETAMINOPHEN 650 MG RE SUPP: 650.0000 mg | Freq: Four times a day (QID) | RECTAL | Status: DC | PRN

## 2020-01-14 MED ORDER — PREDNISONE 20 MG PO TABS
40.0000 mg | ORAL_TABLET | Freq: Every day | ORAL | Status: DC
  Administered 2020-01-14 – 2020-01-16 (×3): 40 mg via ORAL
  Filled 2020-01-14 (×4): qty 2

## 2020-01-14 MED ORDER — ALBUTEROL SULFATE (2.5 MG/3ML) 0.083% IN NEBU: 2.5000 mg | INHALATION_SOLUTION | RESPIRATORY_TRACT | Status: DC | PRN

## 2020-01-14 MED ORDER — INSULIN ASPART 100 UNIT/ML ~~LOC~~ SOLN
0.0000 [IU] | Freq: Three times a day (TID) | SUBCUTANEOUS | Status: DC
  Administered 2020-01-14: 3 [IU] via SUBCUTANEOUS
  Administered 2020-01-14: 5 [IU] via SUBCUTANEOUS
  Administered 2020-01-15 (×2): 2 [IU] via SUBCUTANEOUS

## 2020-01-14 MED ORDER — CELECOXIB 200 MG PO CAPS
400.0000 mg | ORAL_CAPSULE | Freq: Once | ORAL | Status: AC
  Administered 2020-01-14: 400 mg via ORAL
  Filled 2020-01-14: qty 2

## 2020-01-14 MED ORDER — ACETAMINOPHEN 500 MG PO TABS
1000.0000 mg | ORAL_TABLET | Freq: Once | ORAL | Status: DC
  Filled 2020-01-14: qty 2

## 2020-01-14 MED ORDER — GABAPENTIN 300 MG PO CAPS
300.0000 mg | ORAL_CAPSULE | Freq: Three times a day (TID) | ORAL | Status: DC
  Administered 2020-01-14 – 2020-01-16 (×6): 300 mg via ORAL
  Filled 2020-01-14 (×7): qty 1

## 2020-01-14 MED ORDER — ENOXAPARIN SODIUM 40 MG/0.4ML ~~LOC~~ SOLN
40.0000 mg | Freq: Every day | SUBCUTANEOUS | Status: DC
  Administered 2020-01-14 – 2020-01-15 (×2): 40 mg via SUBCUTANEOUS
  Filled 2020-01-14 (×3): qty 0.4

## 2020-01-14 MED ORDER — ACETAMINOPHEN 325 MG PO TABS: 650.0000 mg | ORAL_TABLET | Freq: Four times a day (QID) | ORAL | Status: DC | PRN

## 2020-01-14 MED ORDER — IPRATROPIUM-ALBUTEROL 0.5-2.5 (3) MG/3ML IN SOLN
3.0000 mL | Freq: Four times a day (QID) | RESPIRATORY_TRACT | Status: DC
  Administered 2020-01-14 (×2): 3 mL via RESPIRATORY_TRACT
  Filled 2020-01-14 (×2): qty 3

## 2020-01-14 MED ORDER — ONDANSETRON HCL 4 MG/2ML IJ SOLN: 4.0000 mg | Freq: Four times a day (QID) | INTRAMUSCULAR | Status: DC | PRN

## 2020-01-14 NOTE — ED Notes (Signed)
Paged dr Caron Presume to Rn Joe--Desaray Marschner

## 2020-01-14 NOTE — Progress Notes (Addendum)
Patient reports pain 9/10, states pain is in back and chest and has been ongoing.  Offered patient Tylenol but patient declined. Patient request I call doctor to request pain meds. Patient states she is unable to take Tramadol or Toradol, both cause rash. Dr. Gwyneth Revels notified.

## 2020-01-14 NOTE — ED Notes (Signed)
Pt. Reports continuous chest pain of 9 that is continuous. MD notified

## 2020-01-14 NOTE — ED Notes (Signed)
This RN messaged pharmacy to verify/send medication

## 2020-01-14 NOTE — Progress Notes (Signed)
Paged by nurse about chest pain that was 9/10. Ordered EKG. Went to evaluate patient. Patient was resting comfortably in bed. She reports that the pain has been present for a few weeks now, she reports that it is located substernally, sharp pain, radiates to her back and worsens with deep breaths. She reports that she has tried ibuprofen and tylenol and reports that they did not help. No other associated symptoms with this.   Of note she reports that she was getting Oxycodone 5 mg TID filled at [facility] on [address] in [place]. Contacted pharmacy, they report that she does not have these prescriptions listed in their chart. She also did not pick up her prescription from 01/02/20.   On exam she is HD stable, NAD. Pulmonary exam showed diffuse wheezing. Cardiac exam showed RRR, no m/r/g. Minimal tenderness to palpation.  EKG showed HR 86, NSR, no ST changes. Prior labs significant for D-dimer of 0.29. CBC and BMP relatively unremarkable.   Patient is HD stable, EKG and labs are all reassuring. She appears comfortable on exam with no significant findings. There is less concern about cardiac etiology at this time. This could be related to her asthma exacerbation causing some pleuritic chest pain. Patient does have toradol listed as an allergy, however she has been able to take ibuprofen with no issues. Will try on a different NSAID for now.   -Voltaren gel -Celecoxib 400 mg once, if efficacious will continue PRN -Unclear use of Oxycodone, will try to avoid opioids for now, no clear indication

## 2020-01-14 NOTE — Progress Notes (Signed)
Subjective:   Pt was seen at the bedside today. She states she feels better than when she came in, but continues to have some chest pain, productive cough with green sputum and SOB. She feels that she gets SOB with speaking in longer sentences. She has not had issues using the bathroom.  She reported that she was taking oxycodone 5 mg 3 times daily, states that she sees a pain specialist. She stated that she gets her pain medications filled from a pain clinic on [address], she last filled the prescription in December. She stated that [facility] on [address] in [place]. She reports that she did not get it filled since December because of cost and insurance issues.  Discussed that we will look into this and address as necessary.  All concerns were addressed.   Patient also reports that she is allergic to both tramadol and toradol, she had both of them in 2014 and they both cause Hives.   Objective: CBC Latest Ref Rng & Units 01/13/2020 01/13/2020 10/29/2019  WBC 4.0 - 10.5 K/uL - 8.6 7.7  Hemoglobin 12.0 - 15.0 g/dL 12.6 12.0 11.6(L)  Hematocrit 36.0 - 46.0 % 37.0 39.1 37.9  Platelets 150 - 400 K/uL - 462(H) 431(H)   BMP Latest Ref Rng & Units 01/14/2020 01/13/2020 01/13/2020  Glucose 70 - 99 mg/dL 249(H) - 109(H)  BUN 6 - 20 mg/dL 11 - 12  Creatinine 0.44 - 1.00 mg/dL 0.90 - 0.86  Sodium 135 - 145 mmol/L 138 142 139  Potassium 3.5 - 5.1 mmol/L 4.1 3.9 4.6  Chloride 98 - 111 mmol/L 103 - 105  CO2 22 - 32 mmol/L 18(L) - 26  Calcium 8.9 - 10.3 mg/dL 9.1 - 9.4   Vital signs in last 24 hours: Vitals:   01/14/20 0547 01/14/20 0550 01/14/20 0631 01/14/20 0803  BP: (!) 131/99  117/77 121/77  Pulse: 97  (!) 104 (!) 105  Resp: 18  17 (!) 24  Temp:  97.7 F (36.5 C) 97.9 F (36.6 C) 98.1 F (36.7 C)  TempSrc:  Axillary Oral Oral  SpO2: 99%  92% 99%  Weight:   (!) 140 kg   Height:   5\' 3"  (1.6 m)    Physical Exam General: NAD, sitting up in bed, mildly  increased work of breathing HEENT: Normocephalic, atraumatic CV: RRR, no m/r/g PULM: Coughing during exam, diffuse wheezing throughout lung fields ABD: Soft, non-tender, non-distended  Bedside U/S: No obvious areas of edema, no b lines noted  Assessment/Plan:  Principal Problem:   Asthma exacerbation Active Problems:   Sickle cell anemia (HCC)   Type 2 DM with CKD and hypertension (HCC)   OSA (obstructive sleep apnea)  In summary, [name] is a 29 year old female with asthma, hypertension, diet control type II diabetes, obesity, OSA, history of PE not on anticoagulation, and sickle cell disease who presented to the emergency department for evaluation of progressive shortness of breath of three days duration.  Treated with BiPAP therapy however continued to have respiratory distress.  Started on treatment for acute asthma exacerbation with albuterol, DuoNeb's, prednisone.  #Acute asthma exacerbation -Patient remains hemodynamically stable, still requiring intermittent BiPAP. High risk given prior intubations and comorbidities.  Covid and influenza negative today she reports improvement in her breathing and wheezing, however continues to have diffuse wheezing on exam with some tachypnea.  Still requiring intermittent BiPAP, will need to be monitored inpatient for this. Given her exposure  history and current presentation there is still concern for COVID infection, will repeat testing.   -Received Decadron and magnesium in the ED -Continue albuterol 2.5 mg every four hours PRN -On DuoNeb scheduled every 6 hours -Continue on prednisone 40 mg daily -Continue BiPAP as needed -Checking RVP -Repeat COVID test  #Sickle cell disease - No indications for acute crisis at this time however LDH is elevated. Hgb has been stable. Currently on therapy to decrease risk of developing acute crisis.  - Continue to monitor H&H - Normal saline at 125 mL per hour - Maintain oxygen sent greater than  88%  # Chronic pain: Patient has a history of AVN of the left hip and recently had right knee pain after a fall on 1/19. Also is reporting occasional pleuritic chest pain. She reports that she was on Oxycodone 5 mg TID for chronic pain and see's a pain clinic for this. Currently in the process of establishing with [facility] pain clinic. On chart and PDMP review she was prescribed Oxycodone 5 mg # 20 on 01/02/2020 for right knee pain following a fall. Her last Oxycodone prescription was on 08/12/18 for #20. [facility] [address] [facility] see if they have refilled this. At this time will hold off on restarting any pain medications. She reported allergies to toradol, tramadol, and dilaudid.  -Tylenol PRN for now -May try other NSAIDS if needed  #Type 2 DM  - Diet controlled as an outpatient. - PRN sliding scale insulin while on steroids  #OSA - PRN CPAP/BiPAP  #FEN/GI -Diet: NPO -Fluids: NS 125 cc/hr  #DVT prophylaxis - Lovenox 40 mg subq injections  #CODE STATUS: FULL  #Dispo: Anticipated discharge in approximately 2-3 day(s).  Prior to Admission Living Arrangement: Home Anticipated Discharge Location: Home Barriers to Discharge: Need for medical improvement  Claudean Severance, M.D. PGY2 Pager [phone] 01/14/2020 10:14 AM

## 2020-01-14 NOTE — Progress Notes (Signed)
Paged by pharmacy regarding patient's allergy to Toradol as seen on her allergy list. Called ED and spoke with patient via RN. Patient notes that Tramadol gives her hives but Toradol does not. Felicia Bradley endorses no allergic reaction to Toradol. Will be ok to give the Toradol.  [name], MD Internal Medicine Residency-PGY1 01/14/20  4:13 AM

## 2020-01-14 NOTE — ED Notes (Addendum)
This RN messaged pharmacy regarding Toradol prescription. Pharmacy called this RN, advised that attending MD was messaged & paged regarding Toradol R/T pt allergy. This RN paged pharmacy about the same. Will administer pain medication per order & continue to monitor pt.

## 2020-01-14 NOTE — Progress Notes (Signed)
RT NOTES: RN removed patient from bipap to go to bathroom. Placed patient on 3lpm nasal cannula for support. Sats 98%. Will continue to monitor.

## 2020-01-14 NOTE — ED Notes (Signed)
PAGED DR HELBERG TO RN JOE--Felicia Bradley

## 2020-01-14 NOTE — H&P (Signed)
Date: 01/14/2020               Patient Name:  Felicia Bradley MRN: [id number]  DOB: Dec 28, 1990 Age / Sex: 29 y.o., female   PCP: [facility]         Medical Service: Internal Medicine Teaching Service         Attending Physician: Dr. Mikey Bussing, Marthenia Rolling, DO    First Contact: Dr. Darl Pikes Pager: [phone]  Second Contact: Dr. Gwyneth Revels Pager: [phone]       After Hours (After 5p/  First Contact Pager: [phone]  weekends / holidays): Second Contact Pager: [phone]   Chief Complaint: [facility]  History of Present Illness: Felicia Bradley is a 29 year old female with asthma, hypertension, diet control type II diabetes, obesity, OSA, history of PE not on anticoagulation, and sickle cell disease who presented to the emergency department for evaluation of progressive shortness of breath of three days duration. History was obtained via the patient and through chart review.  Patient states that approximately three days ago she developed headaches and shortness of breath. Initially her shortness of breath as well controlled with her rescue albuterol inhaler; however, over the course of the last three days it has gotten progressively worse. It has progressed to the point that she gets no relief with any of her rescue inhalers. This prompted her to call EMS for further evaluation. She states that she has had to be hospitalized multiple times for asthma. On several occasions she has been intubated and on most occasions she has required prolonged courses of BiPAP therapy. In addition to her headaches and shortness of breath she endorses decreased appetite resulting in decreased PO intake and nausea. She has also developed a productive cough and pleuritic chest pain. She has not tried any over-the-counter medications. She reports that her boyfriend tested positive for COVID yesterday and she has had frequent contact with him. She otherwise denies fevers/chills, sinus congestion, sore throat, abdominal pain,  diarrhea, dysuria, myalgias, arthralgias, lower extremity edema.   Meds:  Current Meds  Medication Sig  . acetaminophen (TYLENOL) 325 MG tablet Take 325 mg by mouth every 6 (six) hours as needed for moderate pain.  Marland Kitchen albuterol (VENTOLIN HFA) 108 (90 Base) MCG/ACT inhaler Inhale 1 puff into the lungs every 6 (six) hours as needed for wheezing or shortness of breath.   . ALPRAZolam (XANAX) 0.5 MG tablet Take 0.5 mg by mouth 3 (three) times daily.  Marland Kitchen EPINEPHrine 0.3 mg/0.3 mL IJ SOAJ injection Inject 0.3 mg into the muscle as needed for anaphylaxis.   . ferrous sulfate 325 (65 FE) MG EC tablet Take 325 mg by mouth daily.  Marland Kitchen gabapentin (NEURONTIN) 300 MG capsule Take 300 mg by mouth 3 (three) times daily.  Marland Kitchen ipratropium-albuterol (DUONEB) 0.5-2.5 (3) MG/3ML SOLN Take 3 mLs by nebulization 3 (three) times daily.   . Multiple Vitamins-Minerals (MULTIVITAMIN ADULT PO) Take 1 tablet by mouth daily.  Marland Kitchen oxyCODONE (ROXICODONE) 5 MG immediate release tablet Take 1 tablet (5 mg total) by mouth every 6 (six) hours as needed for up to 20 doses for breakthrough pain.  . traZODone (DESYREL) 100 MG tablet Take 150 mg by mouth at bedtime as needed for sleep.    Allergies: Allergies as of 01/13/2020 - Review Complete 01/13/2020  Allergen Reaction Noted  . Contrast media [iodinated diagnostic agents] Anaphylaxis 11/20/2015  . Dilaudid [hydromorphone] Anaphylaxis 11/20/2015  . Tramadol Itching 02/17/2016  . Apple Hives 11/20/2015  . Banana Hives 11/20/2015  .  Decadron [dexamethasone]  10/30/2019  . Ketorolac Itching 02/17/2016  . Other Hives 11/20/2015  . Solu-medrol [methylprednisolone] Nausea And Vomiting 09/08/2017  . Toradol [ketorolac tromethamine] Hives 11/20/2015   Past Medical History:  Diagnosis Date  . Asthma   . Diabetes mellitus without complication ([place])    type 2  . Hypertension   . Obesity   . Pulmonary embolism ([place])   . Sickle cell anemia (HCC)    Family History: Reports a family  history of obesity, hypertension, diabetes, mental health conditions, and cancers.  Social History: Originally from [place] however now currently lives in [place]. Works is a Insurance claims handler. Has worked at her current job for approximately eight years. Has four-year-old fraternal twins that live with her. Has a boyfriend. Denies use of alcohol, tobacco, or illicit substances.  Review of Systems: A complete ROS was negative except as per HPI.   Physical Exam: Blood pressure 136/80, pulse 93, temperature 100 F (37.8 C), temperature source Oral, resp. rate 19, height 5\' 3"  (1.6 m), weight (!) 145.2 kg, last menstrual period 01/02/2020, SpO2 93 %.  General: Morbidly obese female in no acute distress HENT: Normocephalic, atraumatic, moist mucus membranes Pulm: Diminished air movement with diffuse wheezing in all lung fields CV: Tachycardic but regular rhythm, no murmurs, no rubs  Abdomen: Active bowel sounds, soft, non-distended, no tenderness to palpation  Extremities: Pulses palpable in all extremities, no LE edema  Skin: Warm and dry  Neuro: Alert and oriented x 3  CXR: personally reviewed my interpretation is good rotation but poor penetration and inspiration. No bony or soft tissue abnormalities. No opacities. No enlargement of the cardiac silhouette. No mediastinal widening.  Assessment & Plan by Problem: Principal Problem:   Asthma exacerbation Active Problems:   Sickle cell anemia (HCC)   Type 2 DM with CKD and hypertension (HCC)   OSA (obstructive sleep apnea)  Felicia Bradley is a 29 year old female with asthma, hypertension, diet control type II diabetes, obesity, OSA, history of PE not on anticoagulation, and sickle cell disease who presented to the emergency department for evaluation of progressive shortness of breath of three days duration. On physical exam she is hemodynamically stable and not hypoxic but has diffuse inspiratory and expiratory wheezing. She was initially  treated with approximately one hour of BiPAP therapy; however, continues to have significant respiratory distress. She was subsequently admitted for further evaluation/management.  Acute asthma exacerbation - Currently hemodynamically stable but high risk given prior intubations and comorbidities.  - s/p Decadron 10 mg IV and magnesium sulfate 2 g - Continue albuterol 2.5 mg every four hours PRN - Schedule DuoNebs 0.5 - 2.5 mg every six hours - Start prednisone 40 mg once daily - No indication for antibiotics at this time - PRN BiPAP - Testing for influenza and COVID  Sickle cell disease - Although LDH is elevated no other indication of acute crisis or acute chest. - Continue to monitor H&H - Normal saline at 125 mL per hour - Maintain oxygen sent greater than 88%  Type 2 DM  - Diet controlled as an outpatient. - PRN sliding scale insulin while on steroids  OSA - PRN CPAP/BiPAP  Diet: NPO qhile using BiPAP VTE ppx: Lovenox  CODE STATUS: Full Code  Dispo: Admit patient to Inpatient with expected length of stay greater than 2 midnights.  SignedIna Homes, MD 01/14/2020, 12:19 AM  Pager: [phone]

## 2020-01-15 DIAGNOSIS — Z79899 Other long term (current) drug therapy: Secondary | ICD-10-CM

## 2020-01-15 LAB — GLUCOSE, CAPILLARY
Glucose-Capillary: 136 mg/dL — ABNORMAL HIGH (ref 70–99)
Glucose-Capillary: 119 mg/dL — ABNORMAL HIGH (ref 70–99)
Glucose-Capillary: 124 mg/dL — ABNORMAL HIGH (ref 70–99)
Glucose-Capillary: 279 mg/dL — ABNORMAL HIGH (ref 70–99)

## 2020-01-15 LAB — CBC
HCT: 37.5 % (ref 36.0–46.0)
Hemoglobin: 11.2 g/dL — ABNORMAL LOW (ref 12.0–15.0)
MCH: 26.9 pg (ref 26.0–34.0)
MCHC: 29.9 g/dL — ABNORMAL LOW (ref 30.0–36.0)
MCV: 90.1 fL (ref 80.0–100.0)
Platelets: 441 10*3/uL — ABNORMAL HIGH (ref 150–400)
RBC: 4.16 MIL/uL (ref 3.87–5.11)
RDW: 15.9 % — ABNORMAL HIGH (ref 11.5–15.5)
WBC: 14 10*3/uL — ABNORMAL HIGH (ref 4.0–10.5)
nRBC: 0 % (ref 0.0–0.2)

## 2020-01-15 LAB — BASIC METABOLIC PANEL
Anion gap: 7 (ref 5–15)
BUN: 15 mg/dL (ref 6–20)
CO2: 26 mmol/L (ref 22–32)
Calcium: 9.4 mg/dL (ref 8.9–10.3)
Chloride: 108 mmol/L (ref 98–111)
Creatinine, Ser: 0.78 mg/dL (ref 0.44–1.00)
GFR calc Af Amer: >60 mL/min (ref >60)
GFR calc non Af Amer: >60 mL/min (ref >60)
Glucose, Bld: 153 mg/dL — ABNORMAL HIGH (ref 70–99)
Potassium: 4.5 mmol/L (ref 3.5–5.1)
Sodium: 141 mmol/L (ref 135–145)

## 2020-01-15 MED ORDER — INSULIN ASPART 100 UNIT/ML ~~LOC~~ SOLN
0.0000 [IU] | Freq: Three times a day (TID) | SUBCUTANEOUS | Status: DC
  Administered 2020-01-16: 09:00:00 2 [IU] via SUBCUTANEOUS

## 2020-01-15 MED ORDER — INSULIN ASPART 100 UNIT/ML ~~LOC~~ SOLN
0.0000 [IU] | Freq: Every day | SUBCUTANEOUS | Status: DC
  Administered 2020-01-15: 22:00:00 2 [IU] via SUBCUTANEOUS

## 2020-01-15 MED ORDER — ALBUTEROL SULFATE (2.5 MG/3ML) 0.083% IN NEBU
2.5000 mg | INHALATION_SOLUTION | Freq: Four times a day (QID) | RESPIRATORY_TRACT | Status: DC
  Administered 2020-01-15 – 2020-01-16 (×5): 2.5 mg via RESPIRATORY_TRACT
  Filled 2020-01-15 (×5): qty 3

## 2020-01-15 NOTE — Progress Notes (Signed)
Subjective:   Patient reports that she wakes up at night due to SOB. She reports recent hospitalization. She is on advair at home, taking 250 mg BID. Still having some pain when she is coughing.   Objective: CBC Latest Ref Rng & Units 01/13/2020 01/13/2020 10/29/2019  WBC 4.0 - 10.5 K/uL - 8.6 7.7  Hemoglobin 12.0 - 15.0 g/dL 12.6 12.0 11.6(L)  Hematocrit 36.0 - 46.0 % 37.0 39.1 37.9  Platelets 150 - 400 K/uL - 462(H) 431(H)   BMP Latest Ref Rng & Units 01/14/2020 01/13/2020 01/13/2020  Glucose 70 - 99 mg/dL 249(H) - 109(H)  BUN 6 - 20 mg/dL 11 - 12  Creatinine 0.44 - 1.00 mg/dL 0.90 - 0.86  Sodium 135 - 145 mmol/L 138 142 139  Potassium 3.5 - 5.1 mmol/L 4.1 3.9 4.6  Chloride 98 - 111 mmol/L 103 - 105  CO2 22 - 32 mmol/L 18(L) - 26  Calcium 8.9 - 10.3 mg/dL 9.1 - 9.4   Vital signs in last 24 hours: Vitals:   01/14/20 1523 01/14/20 2030 01/14/20 2036 01/15/20 0014  BP: 96/80 99/69  109/72  Pulse:      Resp: 19 (!) 24  19  Temp: 98.1 F (36.7 C)  98.7 F (37.1 C)   TempSrc: Oral  Oral   SpO2: 97% 100%  100%  Weight:      Height:       Physical Exam General: Obese, wearing nasal canula HEENT: [place]/AT , trachea midline Cardiovascular: RRR, no mrg Pulmonary : Diffuse wheezing in all lung fields, normal effort Abdominal: Non tender, normal bowel sounds    Assessment/Plan:  Principal Problem:   Asthma exacerbation Active Problems:   Sickle cell anemia (HCC)   Type 2 DM with CKD and hypertension (HCC)   OSA (obstructive sleep apnea)   Suspected COVID-19 virus infection  In summary, Ms. Desarai Barrack is a 29 year old female with asthma, hypertension, diet controlled type II diabetes, obesity, OSA, history of PE not on anticoagulation, and sickle cell disease who presented to the emergency department for evaluation of progressive shortness of breath of three days duration.  Treated with BiPAP therapy however continued to have respiratory distress.  Started on treatment for  acute asthma exacerbation with albuterol, DuoNeb's, prednisone.  #Acute asthma exacerbation Repeat Covid and RVP negative.  -Continue albuterol 2.5 mg every four hours PRN -Continue DuoNeb scheduled every 6 hours -Continue on prednisone 40 mg daily. On day 2 of 5.   - Apply supplemental oxygen if % Sat < 90 %, titrate to 93-95%. - Continue BiPAP as needed   #Sickle cell disease - No signs of acute crisis , continue IV fluids for prophylactic therapy.  - Continue to monitor H&H - Normal saline at 125 mL per hour - Supplemental oxygen as above   # Chronic pain: Patient has a history of AVN of the left hip and recently had right knee pain after a fall on 1/19. Also is reporting occasional pleuritic chest pain. She reports that she was on Oxycodone 5 mg TID for chronic pain and see's a pain clinic for this. Currently in the process of establishing with [facility] pain clinic. On chart and PDMP review she was prescribed Oxycodone 5 mg # 20 on 01/02/2020 for right knee pain following a fall. Her last Oxycodone prescription was on 08/12/18 for #20. [facility] [address] [facility] see if they have refilled this. At this time will hold off on  restarting any pain medications. She reported allergies to toradol, tramadol, and dilaudid.  -Tylenol PRN for now -Celecoxib  #Type 2 DM  - Diet controlled as an outpatient. A1C 6.2 on 1/31.  - PRN sliding scale insulin while on steroids  #OSA - PRN CPAP/BiPAP  #FEN/GI -Diet: NPO -Fluids: NS 125 cc/hr  #DVT prophylaxis - Lovenox 40 mg subq injections  #CODE STATUS: FULL  #Dispo: Anticipated discharge in approximately 1-2 day(s).  Prior to Admission Living Arrangement: Home Anticipated Discharge Location: Home Barriers to Discharge: Need for medical improvement  Thurmon Fair, MD PGY1

## 2020-01-16 LAB — BASIC METABOLIC PANEL
Anion gap: 10 (ref 5–15)
BUN: 16 mg/dL (ref 6–20)
CO2: 25 mmol/L (ref 22–32)
Calcium: 9 mg/dL (ref 8.9–10.3)
Chloride: 105 mmol/L (ref 98–111)
Creatinine, Ser: 0.72 mg/dL (ref 0.44–1.00)
GFR calc Af Amer: >60 mL/min (ref >60)
GFR calc non Af Amer: >60 mL/min (ref >60)
Glucose, Bld: 123 mg/dL — ABNORMAL HIGH (ref 70–99)
Potassium: 3.6 mmol/L (ref 3.5–5.1)
Sodium: 140 mmol/L (ref 135–145)

## 2020-01-16 LAB — CBC
HCT: 37.6 % (ref 36.0–46.0)
Hemoglobin: 11.4 g/dL — ABNORMAL LOW (ref 12.0–15.0)
MCH: 26.8 pg (ref 26.0–34.0)
MCHC: 30.3 g/dL (ref 30.0–36.0)
MCV: 88.5 fL (ref 80.0–100.0)
Platelets: 453 10*3/uL — ABNORMAL HIGH (ref 150–400)
RBC: 4.25 MIL/uL (ref 3.87–5.11)
RDW: 15.9 % — ABNORMAL HIGH (ref 11.5–15.5)
WBC: 10.4 10*3/uL (ref 4.0–10.5)
nRBC: 0 % (ref 0.0–0.2)

## 2020-01-16 LAB — GLUCOSE, CAPILLARY: Glucose-Capillary: 140 mg/dL — ABNORMAL HIGH (ref 70–99)

## 2020-01-16 MED ORDER — PREDNISONE 10 MG PO TABS: ORAL_TABLET | ORAL | 0 refills | Status: AC

## 2020-01-16 NOTE — Progress Notes (Signed)
Subjective:  Patient reports feeling well this morning. She has been walking around the room on her own and notes no significant respiratory distress with ambulation. Discussed continuing prednisone taper on discharge and ambulating with RN. Pt questions about discharge planning regarding transport options on discharge.  Objective:  Vital signs in last 24 hours: Vitals:   01/15/20 1919 01/15/20 2008 01/15/20 2355 01/16/20 0050  BP:  109/77 121/64   Pulse:      Resp:  (!) 22 19   Temp:  98.2 F (36.8 C)    TempSrc:  Oral    SpO2: 100% 100% 97% 94%  Weight:      Height:       Physical Exam General: Obese, wearing nasal canula HEENT: [place]/AT , trachea midline Cardiovascular: RRR, no mrg Pulmonary : No wheezing,  normal effort Abdominal: Non tender, normal bowel sounds   CBC Latest Ref Rng & Units 01/15/2020 01/13/2020 01/13/2020  WBC 4.0 - 10.5 K/uL 14.0(H) - 8.6  Hemoglobin 12.0 - 15.0 g/dL 11.2(L) 12.6 12.0  Hematocrit 36.0 - 46.0 % 37.5 37.0 39.1  Platelets 150 - 400 K/uL 441(H) - 462(H)   BMP Latest Ref Rng & Units 01/15/2020 01/14/2020 01/13/2020  Glucose 70 - 99 mg/dL 153(H) 249(H) -  BUN 6 - 20 mg/dL 15 11 -  Creatinine 0.44 - 1.00 mg/dL 0.78 0.90 -  Sodium 135 - 145 mmol/L 141 138 142  Potassium 3.5 - 5.1 mmol/L 4.5 4.1 3.9  Chloride 98 - 111 mmol/L 108 103 -  CO2 22 - 32 mmol/L 26 18(L) -  Calcium 8.9 - 10.3 mg/dL 9.4 9.1 -    Assessment/Plan:  Principal Problem:   Asthma exacerbation Active Problems:   Sickle cell anemia (HCC)   Type 2 DM with CKD and hypertension (HCC)   OSA (obstructive sleep apnea)   Suspected COVID-19 virus infection  In summary, [name] is a 29 year old female with asthma, hypertension, diet controlled type II diabetes, obesity, OSA, history of PE not on anticoagulation, and sickle cell disease who presented to the emergency department for evaluation of progressive shortness of breath of three days duration.  Treated with BiPAP  therapy however continued to have respiratory distress.  Started on treatment for acute asthma exacerbation with albuterol, DuoNeb's, prednisone.  #Acute asthma exacerbation Repeat Covid and RVP negative. Off supplemental oxygen , sating well on room air. No wheezes.  -Continue albuterol 2.5 mg every four hours PRN -Continue DuoNeb scheduled every 6 hours -Continue on prednisone 40 mg daily. On day 3 of 5. Taper steroid - Apply supplemental oxygen if % Sat < 90 %, titrate to 93-95%. - Continue BiPAP as needed  #Sickle cell disease - no signs of acute crisis  # Chronic pain: Patient has a history of AVN of the left hip and recently had right knee pain after a fall on 1/19. Also is reporting occasional pleuritic chest pain. She reports that she was on Oxycodone 5 mg TID for chronic pain and see's a pain clinic for this. Currently in the process of establishing with [facility] pain clinic. On chart and PDMP review she was prescribed Oxycodone 5 mg # 20 on 01/02/2020 for right knee pain following a fall. Her last Oxycodone prescription was on 08/12/18 for #20. [facility] [address] [facility] if they have refilled this. At this time will hold off on restarting any pain medications. She reported allergies to toradol, tramadol, and dilaudid.  -  Tylenol PRN for now  #Type 2 DM  - Diet controlled as an outpatient. A1C 6.2 on 1/31. BG's trending up with steroids, qhs added P: - PRN sliding scale insulin while on steroids, and qhs   #OSA - PRN CPAP/BiPAP  #FEN/GI -Diet: NPO -Fluids: NS 125 cc/hr  #DVT prophylaxis - Lovenox 40 mg subq injections  #CODE STATUS: FULL  #Dispo: Anticipated discharge in approximately 1-2 day(s).  Prior to Admission Living Arrangement: Home Anticipated Discharge Location: Home Barriers to Discharge: Need for medical improvement  Thurmon Fair, MD PGY1

## 2020-01-16 NOTE — Discharge Instructions (Signed)
Ms.Chianese,  Thank you for trusting me with your care. To recap we treated you for an acute asthma exacerbation. You will go home on a prednisone taper as follows:  40 mg for 2 days ( you already have taken 3 days in hospital), 30 mg for 3 days, 20 mg for 3 days, 10 mgs for 3 days, 5 mg for 3 days  Please follow up with your PCP in 1-2 weeks.

## 2020-01-16 NOTE — Progress Notes (Signed)
Pt with discharge paperwork. Discharge instructions reviewed with pt and all questions answered. Printed prescription with pt at time of discharge. Taxi called. IV removed. PT escorted out via wheelchair with all belongings to taxi with voucher.

## 2020-01-16 NOTE — Progress Notes (Signed)
Pt ambulated in hallway with RN. Pt walked 1 circle around unit on [address], roughly 27ft. Pts oxygen was 99% prior to ambulation. O2 sats dropped to 89-90% mid way through ambulation and was back to 100% after returning to room and sitting.

## 2020-01-16 NOTE — Discharge Summary (Signed)
Name: Felicia Bradley MRN: [id number] DOB: 02/17/1991 29 y.o. PCP: [name]  Date of Admission: 01/13/2020  7:58 PM Date of Discharge: 01/16/2020 Attending Physician: Aldine Contes, MD  Discharge Diagnosis: 1. Asthma exacerbation   Discharge Medications: Allergies as of 01/16/2020      Reactions   Contrast Media [iodinated Diagnostic Agents] Anaphylaxis   Dilaudid [hydromorphone] Anaphylaxis   Tramadol Itching   Apple Hives   Banana Hives   Decadron [dexamethasone]    "feels like my pubic hair is on fire"   Ketorolac Itching   Other Hives   Zucchini and squash   Solu-medrol [methylprednisolone] Nausea And Vomiting   Toradol [ketorolac Tromethamine] Hives      Medication List    TAKE these medications   acetaminophen 325 MG tablet Commonly known as: TYLENOL Take 325 mg by mouth every 6 (six) hours as needed for moderate pain.   ALPRAZolam 0.5 MG tablet Commonly known as: XANAX Take 0.5 mg by mouth 3 (three) times daily.   EPINEPHrine 0.3 mg/0.3 mL Soaj injection Commonly known as: EPI-PEN Inject 0.3 mg into the muscle as needed for anaphylaxis.   ferrous sulfate 325 (65 FE) MG EC tablet Take 325 mg by mouth daily.   gabapentin 300 MG capsule Commonly known as: NEURONTIN Take 300 mg by mouth 3 (three) times daily.   ipratropium-albuterol 0.5-2.5 (3) MG/3ML Soln Commonly known as: DUONEB Take 3 mLs by nebulization 3 (three) times daily.   MULTIVITAMIN ADULT PO Take 1 tablet by mouth daily.   oxyCODONE 5 MG immediate release tablet Commonly known as: Roxicodone Take 1 tablet (5 mg total) by mouth every 6 (six) hours as needed for up to 20 doses for breakthrough pain.   predniSONE 10 MG tablet Commonly known as: DELTASONE Take 4 tablets (40 mg total) by mouth daily with breakfast for 2 days, THEN 3 tablets (30 mg total) daily with breakfast for 3 days, THEN 2 tablets (20 mg total) daily with breakfast for 3 days, THEN 1 tablet (10 mg total)  daily with breakfast for 3 days, THEN 0.5 tablets (5 mg total) daily with breakfast for 3 days. Start taking on: January 17, 2020   traZODone 100 MG tablet Commonly known as: DESYREL Take 150 mg by mouth at bedtime as needed for sleep.   Ventolin HFA 108 (90 Base) MCG/ACT inhaler Generic drug: albuterol Inhale 1 puff into the lungs every 6 (six) hours as needed for wheezing or shortness of breath.       Disposition and follow-up:   Ms.Felicia Bradley was discharged from [facility] [place] in Stable condition.  At the hospital follow up visit please address:  1.  Acute asthma exacerbation  - Sent out on Prednisone 40 mg , scheduled taper by 10 mg every 3 days   - Discharged on home medications       2.  Labs / imaging needed at time of follow-up: blood glucose  3.  Pending labs/ test needing follow-up: na  Follow-up Appointments:   Hospital Course by problem list: 1.   Acute  Asthma exacerbation Received decadron and magnesium sulfate in ED and one hour of BiPAP therapy but continued to have significant respiratory distress. Admitted and started on prednisone, DuoNebs, and PRN BiPAP. Tested twice for COVID-19, both negative. Her boyfriend tested positive the day before her admission. Her RVP also was negative. Patient improved on day 2 , no wheezing and ambulated 100 ft , sating 89-90%. Patient sating 100%  resting. Discharge with prednisone taper. Recommended not seeing boyfriend until his quarantine time was over. Also recommended follow up with PCP.  Type 2 DM  Diet controlled as an outpatient. A1C 6.2 on 1/31. BG's trending up with steroids, placed on sliding scale insulin.    Discharge Vitals:   BP (!) 100/45 (BP Location: Left Arm)   Pulse 88   Temp 98.5 F (36.9 C) (Oral)   Resp 20   Ht 5\' 3"  (1.6 m)   Wt (!) 140 kg   LMP 01/02/2020   SpO2 95%   BMI 54.67 kg/m   Pertinent Labs, Studies, and Procedures:   Hemoglobin A1c: 6.2   CBC Latest Ref Rng &  Units 01/16/2020 01/15/2020 01/13/2020  WBC 4.0 - 10.5 K/uL 10.4 14.0(H) -  Hemoglobin 12.0 - 15.0 g/dL 11.4(L) 11.2(L) 12.6  Hematocrit 36.0 - 46.0 % 37.6 37.5 37.0  Platelets 150 - 400 K/uL 453(H) 441(H) -   BMP Latest Ref Rng & Units 01/16/2020 01/15/2020 01/14/2020  Glucose 70 - 99 mg/dL 01/16/2020) 614(E) 315(Q)  BUN 6 - 20 mg/dL 16 15 11   Creatinine 0.44 - 1.00 mg/dL 008(Q 7.61  Sodium 135 - 145 mmol/L 140 141 138  Potassium 3.5 - 5.1 mmol/L 3.6 4.5 4.1  Chloride 98 - 111 mmol/L 105 108 103  CO2 22 - 32 mmol/L 25 26 18(L)  Calcium 8.9 - 10.3 mg/dL 9.0 9.4 9.1      EXAM: PORTABLE CHEST 1 VIEW  COMPARISON:  October 25, 2019  FINDINGS: The heart size and mediastinal contours are within normal limits. Both lungs are clear. The visualized skeletal structures are unremarkable.  IMPRESSION: No active disease.   Discharge Instructions: Discharge Instructions    Diet - low sodium heart healthy   Complete by: As directed    Increase activity slowly   Complete by: As directed       Signed: 9.32, MD 01/16/2020, 6:32 PM

## 2020-01-18 ENCOUNTER — Emergency Department (HOSPITAL_COMMUNITY): Payer: Medicaid Other

## 2020-01-18 ENCOUNTER — Other Ambulatory Visit: Payer: Self-pay

## 2020-01-18 ENCOUNTER — Inpatient Hospital Stay (HOSPITAL_COMMUNITY)
Admission: EM | Admit: 2020-01-18 | Discharge: 2020-01-22 | DRG: 178 | Disposition: A | Discharge status: Home / Self Care | Payer: Medicaid Other | Attending: Internal Medicine | Admitting: Internal Medicine

## 2020-01-18 ENCOUNTER — Encounter (HOSPITAL_COMMUNITY): Payer: Self-pay | Admitting: Emergency Medicine

## 2020-01-18 DIAGNOSIS — J45901 Unspecified asthma with (acute) exacerbation: Secondary | ICD-10-CM | POA: Diagnosis present

## 2020-01-18 DIAGNOSIS — F319 Bipolar disorder, unspecified: Secondary | ICD-10-CM | POA: Diagnosis not present

## 2020-01-18 DIAGNOSIS — J45909 Unspecified asthma, uncomplicated: Secondary | ICD-10-CM | POA: Diagnosis present

## 2020-01-18 DIAGNOSIS — E1122 Type 2 diabetes mellitus with diabetic chronic kidney disease: Secondary | ICD-10-CM | POA: Diagnosis not present

## 2020-01-18 DIAGNOSIS — G4700 Insomnia, unspecified: Secondary | ICD-10-CM | POA: Diagnosis not present

## 2020-01-18 DIAGNOSIS — G894 Chronic pain syndrome: Secondary | ICD-10-CM | POA: Diagnosis present

## 2020-01-18 DIAGNOSIS — Z888 Allergy status to other drugs, medicaments and biological substances status: Secondary | ICD-10-CM | POA: Diagnosis not present

## 2020-01-18 DIAGNOSIS — Z86711 Personal history of pulmonary embolism: Secondary | ICD-10-CM

## 2020-01-18 DIAGNOSIS — F200 Paranoid schizophrenia: Secondary | ICD-10-CM | POA: Diagnosis present

## 2020-01-18 DIAGNOSIS — Z91018 Allergy to other foods: Secondary | ICD-10-CM | POA: Diagnosis not present

## 2020-01-18 DIAGNOSIS — U071 COVID-19: Secondary | ICD-10-CM | POA: Diagnosis present

## 2020-01-18 DIAGNOSIS — Z833 Family history of diabetes mellitus: Secondary | ICD-10-CM

## 2020-01-18 DIAGNOSIS — J80 Acute respiratory distress syndrome: Secondary | ICD-10-CM | POA: Diagnosis present

## 2020-01-18 DIAGNOSIS — Z6841 Body Mass Index (BMI) 40.0 and over, adult: Secondary | ICD-10-CM | POA: Diagnosis not present

## 2020-01-18 DIAGNOSIS — Z79899 Other long term (current) drug therapy: Secondary | ICD-10-CM

## 2020-01-18 DIAGNOSIS — Z825 Family history of asthma and other chronic lower respiratory diseases: Secondary | ICD-10-CM | POA: Diagnosis not present

## 2020-01-18 DIAGNOSIS — J069 Acute upper respiratory infection, unspecified: Secondary | ICD-10-CM | POA: Diagnosis present

## 2020-01-18 DIAGNOSIS — M879 Osteonecrosis, unspecified: Secondary | ICD-10-CM | POA: Diagnosis not present

## 2020-01-18 DIAGNOSIS — D571 Sickle-cell disease without crisis: Secondary | ICD-10-CM | POA: Diagnosis not present

## 2020-01-18 DIAGNOSIS — G4733 Obstructive sleep apnea (adult) (pediatric): Secondary | ICD-10-CM | POA: Diagnosis present

## 2020-01-18 DIAGNOSIS — F329 Major depressive disorder, single episode, unspecified: Secondary | ICD-10-CM | POA: Diagnosis not present

## 2020-01-18 DIAGNOSIS — I129 Hypertensive chronic kidney disease with stage 1 through stage 4 chronic kidney disease, or unspecified chronic kidney disease: Secondary | ICD-10-CM | POA: Diagnosis not present

## 2020-01-18 DIAGNOSIS — J449 Chronic obstructive pulmonary disease, unspecified: Secondary | ICD-10-CM | POA: Diagnosis not present

## 2020-01-18 DIAGNOSIS — Z91041 Radiographic dye allergy status: Secondary | ICD-10-CM | POA: Diagnosis not present

## 2020-01-18 DIAGNOSIS — E119 Type 2 diabetes mellitus without complications: Secondary | ICD-10-CM | POA: Diagnosis not present

## 2020-01-18 DIAGNOSIS — F1911 Other psychoactive substance abuse, in remission: Secondary | ICD-10-CM | POA: Diagnosis present

## 2020-01-18 DIAGNOSIS — F411 Generalized anxiety disorder: Secondary | ICD-10-CM | POA: Diagnosis present

## 2020-01-18 DIAGNOSIS — Z7952 Long term (current) use of systemic steroids: Secondary | ICD-10-CM

## 2020-01-18 DIAGNOSIS — Z765 Malingerer [conscious simulation]: Secondary | ICD-10-CM

## 2020-01-18 LAB — URINALYSIS, ROUTINE W REFLEX MICROSCOPIC
Bacteria, UA: NONE SEEN
Bilirubin Urine: NEGATIVE
Glucose, UA: NEGATIVE mg/dL
Hgb urine dipstick: NEGATIVE
Ketones, ur: NEGATIVE mg/dL
Nitrite: NEGATIVE
Protein, ur: NEGATIVE mg/dL
Specific Gravity, Urine: 1.027 (ref 1.005–1.030)
pH: 6 (ref 5.0–8.0)

## 2020-01-18 LAB — COMPREHENSIVE METABOLIC PANEL
ALT: 19 U/L (ref 0–44)
AST: 15 U/L (ref 15–41)
Albumin: 3.4 g/dL — ABNORMAL LOW (ref 3.5–5.0)
Alkaline Phosphatase: 62 U/L (ref 38–126)
Anion gap: 8 (ref 5–15)
BUN: 15 mg/dL (ref 6–20)
CO2: 28 mmol/L (ref 22–32)
Calcium: 8.5 mg/dL — ABNORMAL LOW (ref 8.9–10.3)
Chloride: 103 mmol/L (ref 98–111)
Creatinine, Ser: 0.83 mg/dL (ref 0.44–1.00)
GFR calc Af Amer: >60 mL/min (ref >60)
GFR calc non Af Amer: >60 mL/min (ref >60)
Glucose, Bld: 95 mg/dL (ref 70–99)
Potassium: 3.8 mmol/L (ref 3.5–5.1)
Sodium: 139 mmol/L (ref 135–145)
Total Bilirubin: 0.5 mg/dL (ref 0.3–1.2)
Total Protein: 7 g/dL (ref 6.5–8.1)

## 2020-01-18 LAB — RESPIRATORY PANEL BY RT PCR (FLU A&B, COVID)
Influenza A by PCR: NEGATIVE
Influenza B by PCR: NEGATIVE
SARS Coronavirus 2 by RT PCR: POSITIVE — AB

## 2020-01-18 LAB — CULTURE, BLOOD (ROUTINE X 2): Culture: NO GROWTH

## 2020-01-18 LAB — CBG MONITORING, ED
Glucose-Capillary: 76 mg/dL (ref 70–99)
Glucose-Capillary: 88 mg/dL (ref 70–99)

## 2020-01-18 LAB — CBC WITH DIFFERENTIAL/PLATELET
Abs Immature Granulocytes: 0.04 10*3/uL (ref 0.00–0.07)
Basophils Absolute: 0 10*3/uL (ref 0.0–0.1)
Basophils Relative: 1 %
Eosinophils Absolute: 0 10*3/uL (ref 0.0–0.5)
Eosinophils Relative: 0 %
HCT: 37.4 % (ref 36.0–46.0)
Hemoglobin: 11.6 g/dL — ABNORMAL LOW (ref 12.0–15.0)
Immature Granulocytes: 1 %
Lymphocytes Relative: 36 %
Lymphs Abs: 2.8 10*3/uL (ref 0.7–4.0)
MCH: 27.3 pg (ref 26.0–34.0)
MCHC: 31 g/dL (ref 30.0–36.0)
MCV: 88 fL (ref 80.0–100.0)
Monocytes Absolute: 0.4 10*3/uL (ref 0.1–1.0)
Monocytes Relative: 6 %
Neutro Abs: 4.4 10*3/uL (ref 1.7–7.7)
Neutrophils Relative %: 56 %
Platelets: 320 10*3/uL (ref 150–400)
RBC: 4.25 MIL/uL (ref 3.87–5.11)
RDW: 15.7 % — ABNORMAL HIGH (ref 11.5–15.5)
WBC: 7.7 10*3/uL (ref 4.0–10.5)
nRBC: 0 % (ref 0.0–0.2)

## 2020-01-18 LAB — C-REACTIVE PROTEIN: CRP: 3.1 mg/dL — ABNORMAL HIGH (ref <1.0)

## 2020-01-18 LAB — RAPID URINE DRUG SCREEN, HOSP PERFORMED
Amphetamines: NOT DETECTED
Barbiturates: NOT DETECTED
Benzodiazepines: NOT DETECTED
Cocaine: NOT DETECTED
Opiates: NOT DETECTED
Tetrahydrocannabinol: NOT DETECTED

## 2020-01-18 LAB — PROCALCITONIN: Procalcitonin: <0.1 ng/mL

## 2020-01-18 LAB — TRIGLYCERIDES: Triglycerides: 72 mg/dL (ref <150)

## 2020-01-18 LAB — LACTIC ACID, PLASMA
Lactic Acid, Venous: 2.3 mmol/L (ref 0.5–1.9)
Lactic Acid, Venous: 1.1 mmol/L (ref 0.5–1.9)

## 2020-01-18 LAB — ABO/RH: ABO/RH(D): O POS

## 2020-01-18 LAB — LACTATE DEHYDROGENASE: LDH: 135 U/L (ref 98–192)

## 2020-01-18 LAB — I-STAT BETA HCG BLOOD, ED (MC, WL, AP ONLY): I-stat hCG, quantitative: <5 m[IU]/mL (ref <5)

## 2020-01-18 LAB — D-DIMER, QUANTITATIVE: D-Dimer, Quant: <0.27 ug/mL-FEU (ref 0.00–0.50)

## 2020-01-18 LAB — FERRITIN: Ferritin: 30 ng/mL (ref 11–307)

## 2020-01-18 LAB — FIBRINOGEN: Fibrinogen: 385 mg/dL (ref 210–475)

## 2020-01-18 LAB — BRAIN NATRIURETIC PEPTIDE: B Natriuretic Peptide: 20.6 pg/mL (ref 0.0–100.0)

## 2020-01-18 IMAGING — DX DG CHEST 1V PORT
1 series · 1 of 1 positions shown · non-contrast
Comparison: [DATE]

CLINICAL DATA: Cough, congestion, weakness for 3 weeks

EXAM:
PORTABLE CHEST 1 VIEW

[chest ap]
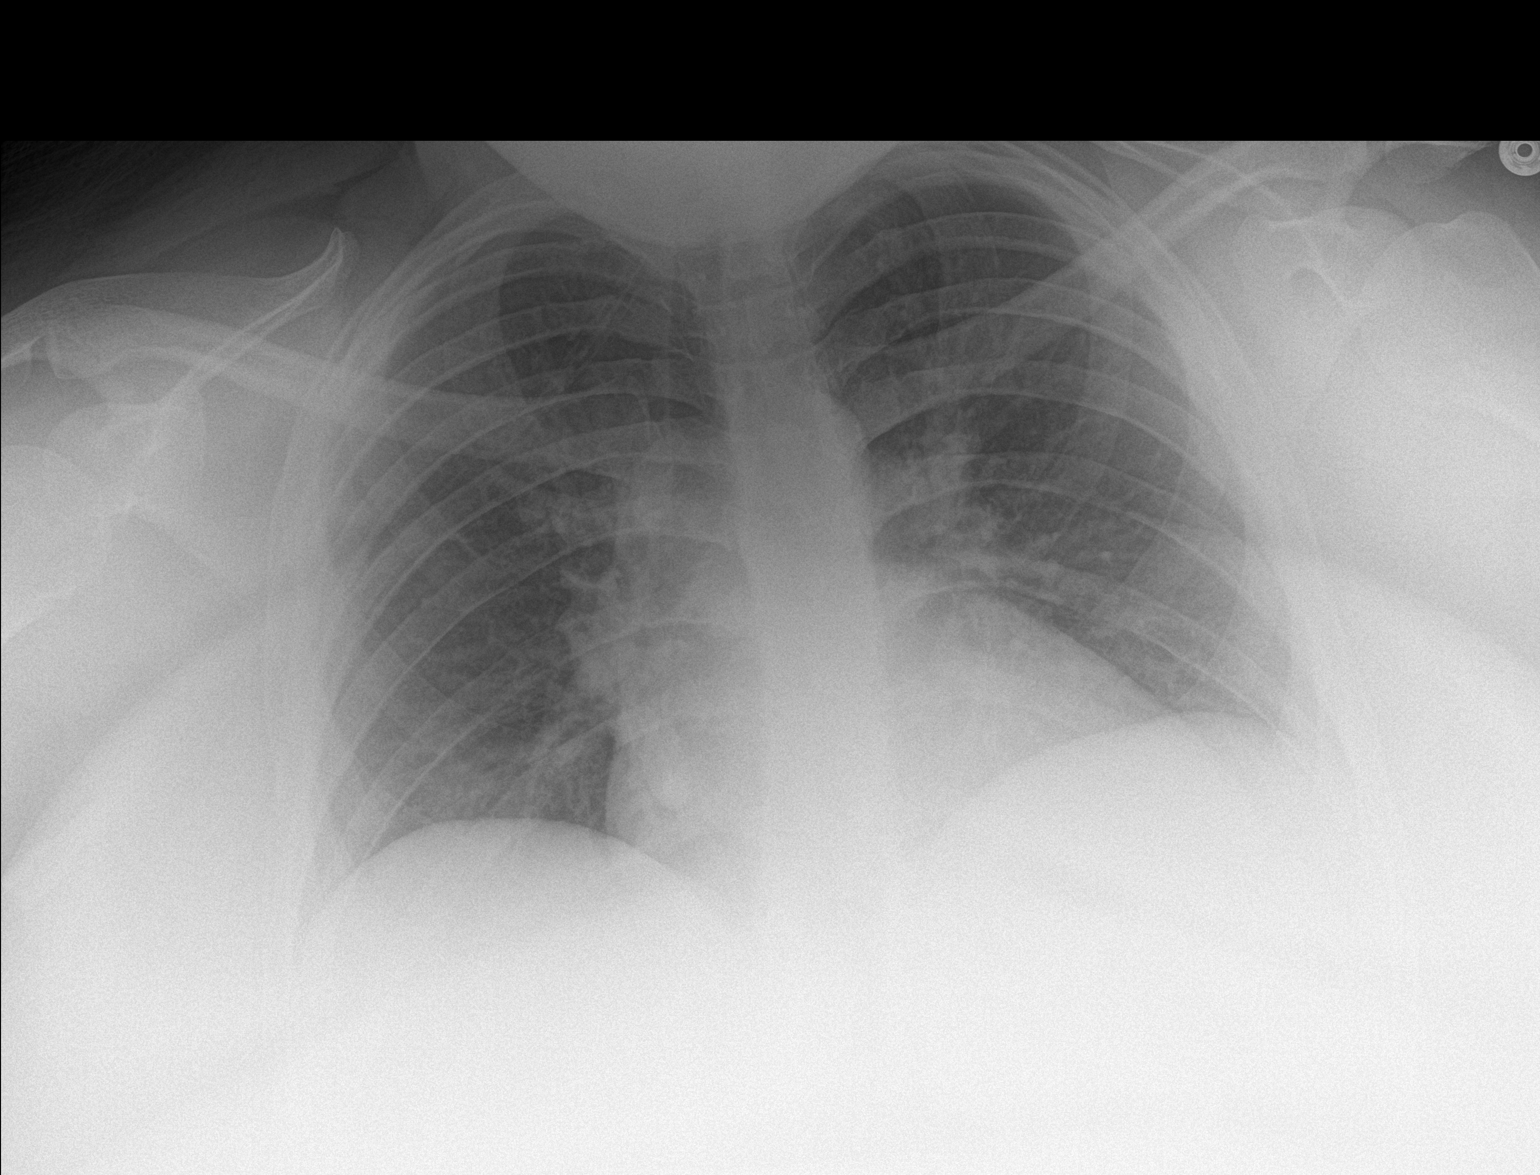

[1 of 1 positions shown; findings below may reference images not displayed]

FINDINGS: The heart size and mediastinal contours are within normal limits.
Both lungs are clear. The visualized skeletal structures are
unremarkable.
IMPRESSION: No active disease.

## 2020-01-18 MED ORDER — ONDANSETRON HCL 4 MG PO TABS
4.0000 mg | ORAL_TABLET | Freq: Four times a day (QID) | ORAL | Status: DC | PRN
  Administered 2020-01-21: 18:00:00 4 mg via ORAL
  Filled 2020-01-18: qty 1

## 2020-01-18 MED ORDER — PREDNISONE 20 MG PO TABS
60.0000 mg | ORAL_TABLET | Freq: Every day | ORAL | Status: DC
  Administered 2020-01-19 – 2020-01-20 (×2): 60 mg via ORAL
  Filled 2020-01-18 (×3): qty 3

## 2020-01-18 MED ORDER — INSULIN ASPART 100 UNIT/ML ~~LOC~~ SOLN
0.0000 [IU] | SUBCUTANEOUS | Status: DC
  Administered 2020-01-19: 7 [IU] via SUBCUTANEOUS
  Administered 2020-01-20: 3 [IU] via SUBCUTANEOUS
  Administered 2020-01-20: 4 [IU] via SUBCUTANEOUS
  Administered 2020-01-20: 7 [IU] via SUBCUTANEOUS
  Administered 2020-01-21: 3 [IU] via SUBCUTANEOUS
  Administered 2020-01-21 (×2): 4 [IU] via SUBCUTANEOUS
  Administered 2020-01-21: 3 [IU] via SUBCUTANEOUS
  Administered 2020-01-22: 7 [IU] via SUBCUTANEOUS
  Filled 2020-01-18: qty 0.2

## 2020-01-18 MED ORDER — ENOXAPARIN SODIUM 80 MG/0.8ML ~~LOC~~ SOLN
0.5000 mg/kg | SUBCUTANEOUS | Status: DC
  Administered 2020-01-18 – 2020-01-19 (×2): 70 mg via SUBCUTANEOUS
  Filled 2020-01-18: qty 0.7
  Filled 2020-01-18 (×2): qty 0.8

## 2020-01-18 MED ORDER — ALBUTEROL (5 MG/ML) CONTINUOUS INHALATION SOLN: 10.0000 mg/h | INHALATION_SOLUTION | RESPIRATORY_TRACT | Status: DC

## 2020-01-18 MED ORDER — PROMETHAZINE HCL 25 MG/ML IJ SOLN
25.0000 mg | Freq: Once | INTRAMUSCULAR | Status: AC
  Administered 2020-01-18: 25 mg via INTRAVENOUS
  Filled 2020-01-18: qty 1

## 2020-01-18 MED ORDER — GUAIFENESIN-DM 100-10 MG/5ML PO SYRP
10.0000 mL | ORAL_SOLUTION | ORAL | Status: DC | PRN
  Administered 2020-01-20 – 2020-01-21 (×3): 10 mL via ORAL
  Filled 2020-01-18 (×3): qty 10

## 2020-01-18 MED ORDER — ALBUTEROL SULFATE HFA 108 (90 BASE) MCG/ACT IN AERS
2.0000 | INHALATION_SPRAY | Freq: Once | RESPIRATORY_TRACT | Status: AC
  Administered 2020-01-18: 2 via RESPIRATORY_TRACT
  Filled 2020-01-18: qty 6.7

## 2020-01-18 MED ORDER — SODIUM CHLORIDE 0.9 % IV SOLN
200.0000 mg | Freq: Once | INTRAVENOUS | Status: AC
  Administered 2020-01-18: 200 mg via INTRAVENOUS
  Filled 2020-01-18: qty 200

## 2020-01-18 MED ORDER — ASCORBIC ACID 500 MG PO TABS
500.0000 mg | ORAL_TABLET | Freq: Every day | ORAL | Status: DC
  Administered 2020-01-18 – 2020-01-22 (×5): 500 mg via ORAL
  Filled 2020-01-18 (×5): qty 1

## 2020-01-18 MED ORDER — ACETAMINOPHEN 500 MG PO TABS
1000.0000 mg | ORAL_TABLET | Freq: Once | ORAL | Status: AC
  Administered 2020-01-18: 1000 mg via ORAL
  Filled 2020-01-18: qty 2

## 2020-01-18 MED ORDER — SODIUM CHLORIDE 0.9 % IV SOLN
100.0000 mg | Freq: Every day | INTRAVENOUS | Status: DC
  Administered 2020-01-19 – 2020-01-21 (×3): 100 mg via INTRAVENOUS
  Filled 2020-01-18 (×4): qty 20

## 2020-01-18 MED ORDER — ACETAMINOPHEN 325 MG PO TABS
650.0000 mg | ORAL_TABLET | Freq: Four times a day (QID) | ORAL | Status: DC | PRN
  Administered 2020-01-19 – 2020-01-20 (×2): 650 mg via ORAL
  Filled 2020-01-18 (×3): qty 2

## 2020-01-18 MED ORDER — ALBUTEROL SULFATE HFA 108 (90 BASE) MCG/ACT IN AERS
2.0000 | INHALATION_SPRAY | RESPIRATORY_TRACT | Status: DC
  Administered 2020-01-18 – 2020-01-22 (×21): 2 via RESPIRATORY_TRACT
  Filled 2020-01-18: qty 6.7

## 2020-01-18 MED ORDER — TRAZODONE HCL 50 MG PO TABS: 150.0000 mg | ORAL_TABLET | Freq: Every evening | ORAL | Status: DC | PRN

## 2020-01-18 MED ORDER — SODIUM CHLORIDE 0.9 % IV SOLN
INTRAVENOUS | Status: DC
  Administered 2020-01-20: 50 mL/h via INTRAVENOUS

## 2020-01-18 MED ORDER — ALPRAZOLAM 0.5 MG PO TABS
0.5000 mg | ORAL_TABLET | Freq: Three times a day (TID) | ORAL | Status: DC
  Administered 2020-01-18 – 2020-01-19 (×3): 0.5 mg via ORAL
  Filled 2020-01-18 (×4): qty 1

## 2020-01-18 MED ORDER — ZINC SULFATE 220 (50 ZN) MG PO CAPS
220.0000 mg | ORAL_CAPSULE | Freq: Every day | ORAL | Status: DC
  Administered 2020-01-18 – 2020-01-22 (×5): 220 mg via ORAL
  Filled 2020-01-18 (×5): qty 1

## 2020-01-18 MED ORDER — ONDANSETRON HCL 4 MG/2ML IJ SOLN
4.0000 mg | Freq: Four times a day (QID) | INTRAMUSCULAR | Status: DC | PRN
  Administered 2020-01-20 – 2020-01-21 (×2): 4 mg via INTRAVENOUS
  Filled 2020-01-18 (×3): qty 2

## 2020-01-18 NOTE — H&P (Addendum)
History and Physical    BRENN GATTON FWY:[id number] DOB: [birth year] DOA: 01/18/2020  PCP: [facility]  Patient coming from: boyfriends house. She lives in [place].   I have personally briefly reviewed patient's old medical records in [facility]  Chief Complaint: pain, not feeling well and shortness of breath and coughing.   HPI: Felicia Bradley is a 29 y.o. female with medical history significant of asthma, diabetes type 2, sickle cell, morbid obesity. She states on 01/10/19 she found out her boyfriend was positive for covid. She had been taking care of him. She was already not feeling well-intermittent dizziness, weakness. On 01/12/2020 she called EMS and was taken to [facility] for shortness of breath and chest tightness. She was negative for covid and told her she had an asthma exacerbation. She was admitted and put on bipap for 2.5 days. She states she was given prednisone, fluids during hospital stay. She was discharged on 01/16/20. She states she continued to have shortness of breath at home. She continued to get weaker and had some falls. She states she was getting dizzy and starting to have body aches. She states her cough has continued and is intermittently productive with green sputum. She was taking robitussin and tylenol over the counter as well as medications she was discharged home from hospital with (prednisone). She also admits to nasal congestion, sinus pressure, denies headaches, chest pain, leg swelling. She does have some nausea and threw up x1 while in room. She states she is not having pain like in a sickle cell crisis.   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 Recent CT shows osteonecrosis of the distal femur and lateral tibial condyle with similar appearance and distribution relative to 10/14/2014.    (ED Course: patient given albuterol and still had tachypnea, tachycardia and increased work of breathing. covid +. CXR with no  active disease. covid labs pending. Blood cultures drawn. Cbc with no leukocytosis or shift. Not requiring oxygen and satting 100% on room air when I examined her.   Review of Systems: As per HPI otherwise 10 point review of systems negative.   Past Medical History:  Diagnosis Date  . Asthma   . Diabetes mellitus without complication (HCC)    type 2  . Hypertension   . Obesity   . Pulmonary embolism (HCC)   . Sickle cell anemia (HCC)     Past Surgical History:  Procedure Laterality Date  . CESAREAN SECTION    . NO PAST SURGERIES       reports that she has never smoked. She has never used smokeless tobacco. She reports that she does not drink alcohol or use drugs.  Allergies  Allergen Reactions  . Contrast Media [Iodinated Diagnostic Agents] Anaphylaxis  . Dilaudid [Hydromorphone] Anaphylaxis  . Tramadol Itching  . Apple Hives  . Banana Hives  . Dexamethasone Nausea And Vomiting and Hives    "feels like my pubic hair is on fire"  . Ketorolac Itching  . Other Hives    Zucchini and squash  . Solu-Medrol [Methylprednisolone] Nausea And Vomiting  . Toradol [Ketorolac Tromethamine] Hives    Family History  Problem Relation Age of Onset  . Diabetes Mother   . Asthma Mother   . Asthma Maternal Aunt   . Diabetes Maternal Aunt      Prior to Admission medications   Medication Sig Start Date End Date Taking? Authorizing Provider  acetaminophen (TYLENOL)  325 MG tablet Take 325 mg by mouth every 6 (six) hours as needed for moderate pain.   Yes [provider]  albuterol (VENTOLIN HFA) 108 (90 Base) MCG/ACT inhaler Inhale 1 puff into the lungs every 6 (six) hours as needed for wheezing or shortness of breath.  11/10/14  Yes [provider]  ALPRAZolam Prudy Feeler) 0.5 MG tablet Take 0.5 mg by mouth 3 (three) times daily. 05/17/19  Yes [provider]  EPINEPHrine 0.3 mg/0.3 mL IJ SOAJ injection Inject 0.3 mg into the muscle as needed for anaphylaxis.   11/10/14  Yes [provider]  ferrous sulfate 325 (65 FE) MG EC tablet Take 325 mg by mouth daily. 11/10/14  Yes [provider]  gabapentin (NEURONTIN) 300 MG capsule Take 300 mg by mouth 3 (three) times daily. 05/15/19  Yes [provider]  ipratropium-albuterol (DUONEB) 0.5-2.5 (3) MG/3ML SOLN Take 3 mLs by nebulization every 6 (six) hours as needed (shortness of breath).  07/19/19  Yes [provider]  Multiple Vitamins-Minerals (MULTIVITAMIN ADULT PO) Take 1 tablet by mouth daily.   Yes [provider]  oxyCODONE (ROXICODONE) 5 MG immediate release tablet Take 1 tablet (5 mg total) by mouth every 6 (six) hours as needed for up to 20 doses for breakthrough pain. 01/02/20  Yes Curatolo, Adam, DO  predniSONE (DELTASONE) 10 MG tablet Take 4 tablets (40 mg total) by mouth daily with breakfast for 2 days, THEN 3 tablets (30 mg total) daily with breakfast for 3 days, THEN 2 tablets (20 mg total) daily with breakfast for 3 days, THEN 1 tablet (10 mg total) daily with breakfast for 3 days, THEN 0.5 tablets (5 mg total) daily with breakfast for 3 days. 01/17/20 01/31/20 Yes [name], MD  traZODone (DESYREL) 100 MG tablet Take 150 mg by mouth at bedtime as needed for sleep.  05/16/19  Yes [provider]    Physical Exam: Vitals:   01/18/20 1132 01/18/20 1134 01/18/20 1153 01/18/20 1318  BP:   117/82 126/75  Pulse: 98  (!) 106 (!) 107  Resp: 18  18 12   Temp: 100 F (37.8 C)     TempSrc: Oral     SpO2: 98%  95% 95%  Weight:  (!) 140 kg    Height:  5\' 3"  (1.6 m)      Constitutional: NAD, calm, comfortable Vitals:   01/18/20 1132 01/18/20 1134 01/18/20 1153 01/18/20 1318  BP:   117/82 126/75  Pulse: 98  (!) 106 (!) 107  Resp: 18  18 12   Temp: 100 F (37.8 C)     TempSrc: Oral     SpO2: 98%  95% 95%  Weight:  (!) 140 kg    Height:  5\' 3"  (1.6 m)     Eyes: PERRL, lids and conjunctivae normal ENMT: Mucous membranes are moist. Posterior  pharynx clear of any exudate or lesions.Normal dentition.  Neck: normal, supple, no masses, no thyromegaly Respiratory: clear to auscultation bilaterally, decreased breath sounds in bases, but no wheezing and good air movement. Normal effort. No accessory muscle use.  Cardiovascular: Regular rate and rhythm, no murmurs / rubs / gallops. No extremity edema. 2+ pedal pulses. No carotid bruits.  Abdomen: no tenderness, no masses palpated. No hepatosplenomegaly. Bowel sounds positive.  Musculoskeletal: no clubbing / cyanosis. No joint deformity upper and lower extremities. Good ROM, no contractures. Normal muscle tone.  Skin: no rashes, lesions, ulcers. No induration Neurologic: CN 2-12 grossly intact. Sensation intact, DTR normal.  Psychiatric: Normal judgment and insight. Alert and oriented x 3. Normal mood.   Labs on Admission: I have personally reviewed following labs and imaging studies  CBC: Recent Labs  Lab 01/13/20 2052 01/13/20 2120 01/15/20 0713 01/16/20 0832 01/18/20 1011  WBC 8.6  --  14.0* 10.4 7.7  NEUTROABS 4.5  --   --   --  4.4  HGB 12.0 12.6 11.2* 11.4* 11.6*  HCT 39.1 37.0 37.5 37.6 37.4  MCV 88.5  --  90.1 88.5 88.0  PLT 462*  --  441* 453* 320   Basic Metabolic Panel: Recent Labs  Lab 01/13/20 2052 01/13/20 2052 01/13/20 2120 01/14/20 0400 01/15/20 0713 01/16/20 0832 01/18/20 1011  NA 139   < > 142 138 141 140 139  K 4.6   < > 3.9 4.1 4.5 3.6 3.8  CL 105  --   --  103 108 105 103  CO2 26  --   --  18* 26 25 28   GLUCOSE 109*  --   --  249* 153* 123* 95  BUN 12  --   --  11 15 16 15   CREATININE 0.86  --   --  0.90 0.78 0.72 0.83  CALCIUM 9.4  --   --  9.1 9.4 9.0 8.5*   < > = values in this interval not displayed.   GFR: Estimated Creatinine Clearance: 138 mL/min (by C-G formula based on SCr of 0.83 mg/dL). Liver Function Tests: Recent Labs  Lab 01/13/20 2052 01/18/20 1011  AST 26 15  ALT 14 19  ALKPHOS 71 62  BILITOT 0.6 0.5  PROT 6.9 7.0    ALBUMIN 3.4* 3.4*   No results for input(s): LIPASE, AMYLASE in the last 168 hours. No results for input(s): AMMONIA in the last 168 hours. Coagulation Profile: No results for input(s): INR, PROTIME in the last 168 hours. Cardiac Enzymes: No results for input(s): CKTOTAL, CKMB, CKMBINDEX, TROPONINI in the last 168 hours. BNP (last 3 results) No results for input(s): PROBNP in the last 8760 hours. HbA1C: No results for input(s): HGBA1C in the last 72 hours. CBG: Recent Labs  Lab 01/15/20 0749 01/15/20 1218 01/15/20 1645 01/15/20 2200 01/16/20 0734  GLUCAP 136* 119* 124* 279* 140*   Lipid Profile: Recent Labs    01/18/20 1412  TRIG 72   Thyroid Function Tests: No results for input(s): TSH, T4TOTAL, FREET4, T3FREE, THYROIDAB in the last 72 hours. Anemia Panel: No results for input(s): VITAMINB12, FOLATE, FERRITIN, TIBC, IRON, RETICCTPCT in the last 72 hours. Urine analysis:    Component Value Date/Time   COLORURINE YELLOW 01/18/2020 1319   APPEARANCEUR HAZY (A) 01/18/2020 1319   LABSPEC 1.027 01/18/2020 1319   PHURINE 6.0 01/18/2020 1319   GLUCOSEU NEGATIVE 01/18/2020 1319   HGBUR NEGATIVE 01/18/2020 1319   BILIRUBINUR NEGATIVE 01/18/2020 1319   KETONESUR NEGATIVE 01/18/2020 1319   PROTEINUR NEGATIVE 01/18/2020 1319   UROBILINOGEN 0.2 12/04/2009 1301   NITRITE NEGATIVE 01/18/2020 1319   LEUKOCYTESUR SMALL (A) 01/18/2020 1319    Radiological Exams on Admission: DG Chest Port 1 View  Result Date: 01/18/2020 CLINICAL DATA:  Cough, congestion, weakness for 3 weeks EXAM: PORTABLE CHEST 1 VIEW COMPARISON:  01/13/2020 FINDINGS: The heart size and mediastinal contours are within normal limits. Both lungs are clear. The visualized skeletal structures are unremarkable. IMPRESSION: No active disease. Electronically Signed   By: 03/17/2020 M.D.   On: 01/18/2020 10:25    EKG: Independently reviewed.   Assessment/Plan Principal Problem:  Acute respiratory disease due  to COVID-19 virus Active Problems:   Sickle cell anemia (HCC)   Type 2 DM with CKD and hypertension (HCC)   OSA (obstructive sleep apnea)   Asthma  1) acute respiratory distress due to covid 10 -not requiring supplemental oxygen at this time, but was on bipap a few days ago and was needing nasal cannula on arrival to ER. Co morbidities concerning for possible decompensation.  -covid labs pending, will need to follow up on these -remdesivir with intermittent supplemental oxygen -oral prednisone due to allergies.   -transfer to [facility] -continue albuterol -start vitamins, continue PO steroids with caution to her AVN   2) asthma -[facility] with acute asthma exacerbation likely secondary to covid infection.  -CXR clear -continue inhalers scheduled and will try to avoid nebulizers with covid if possible -continue steroids.   3) sickle cell anemia -no symptoms of sickle cell crisis at this time.  -IVF-NS hydration and monitoring  4) type 2 diabetes -she states she is on no medication.  -A1c 6.2 on 01/14/20 -will need sliding scale vs. lantus with steroids for covid.  -diabetic diet and accuchecks.   5) OSA -hold cpap with covid, may need supplemental oxygen at night.   6) hx of drug abuse -checking UDS   7) hx of GAD, bipolar, insomnia, depression -can not remember her home dose of abilify. Called pharmacy to verify this and will need to be ordered.  -continue home xanax and trazodone  8) AVN -CT shows osteonecrosis of distal femur lateral epicondyle similar to distribution in 2015. Followed by outpatient ortho. Needs tight glycemic control and pain control.   DVT prophylaxis: Lovenox SQ Code Status: Full code  Family Communication: talked to mother on phone while in room and let her know plan to transfer to [facility].   Disposition Plan: transfer to [place] called: none Admission status: Inpatient   Orma Flaming MD [facility]  If  7PM-7AM, please contact night-coverage 01/18/2020, 3:46 PM

## 2020-01-18 NOTE — ED Notes (Signed)
Date and time results received: 01/18/20 4:00 PM  (use smartphrase ".now" to insert current time)  Test: Lactic acid Critical Value: 2.3  Name of Provider Notified: Galaxi RN   Orders Received? Or Actions Taken?:

## 2020-01-18 NOTE — ED Notes (Signed)
Called [facility] for update on transport. Per Tammy, should be within the next hour or 2.

## 2020-01-18 NOTE — ED Notes (Signed)
Date and time results received: 01/18/20 1:52 PM  (use smartphrase ".now" to insert current time)  Test: Covid  Critical Value: positive  Name of Provider Notified: Galaxi RN   Orders Received? Or Actions Taken?:

## 2020-01-18 NOTE — ED Notes (Signed)
Patient is being taking to [facility] by Josephine.

## 2020-01-18 NOTE — ED Triage Notes (Signed)
Pt arrived by EMS from home, c/o SOB, muscle aches. COVID+   AOx4 99% 2 L [place]

## 2020-01-18 NOTE — ED Provider Notes (Signed)
[facility]-EMERGENCY DEPT Provider Note   CSN: [id number] Arrival date & time: 01/18/20  2130     History No chief complaint on file.   Felicia Bradley is a 29 y.o. female.  HPI   Patient presents 2 days after being discharged from our affiliated facility now with concern for myalgia, dyspnea, cough, chills. She notes that she has multiple medical issues including history of asthma, COPD, diabetes and sickle cell disease.  She was hospitalized last week for dyspnea, notes that she continues to have symptoms as above, worse since discharge.  She has been tested for coronavirus twice over the past weeks, both with negative results. She does, however, note that her boyfriend tested positive last week.   Past Medical History:  Diagnosis Date  . Asthma   . Diabetes mellitus without complication (HCC)    type 2  . Hypertension   . Obesity   . Pulmonary embolism (HCC)   . Sickle cell anemia [facility])     Patient Active Problem List   Diagnosis Date Noted  . Asthma exacerbation 01/14/2020  . Sickle cell anemia (HCC) 01/14/2020  . Type 2 DM with CKD and hypertension (HCC) 01/14/2020  . OSA (obstructive sleep apnea) 01/14/2020  . Suspected COVID-19 virus infection     Past Surgical History:  Procedure Laterality Date  . CESAREAN SECTION    . NO PAST SURGERIES       OB History    Gravida  2   Para      Term      Preterm      AB  2   Living  0     SAB  1   TAB  1   Ectopic      Multiple      Live Births              Family History  Problem Relation Age of Onset  . Diabetes Mother   . Asthma Mother   . Asthma Maternal Aunt   . Diabetes Maternal Aunt     Social History   Tobacco Use  . Smoking status: Never Smoker  . Smokeless tobacco: Never Used  Substance Use Topics  . Alcohol use: No  . Drug use: No    Home Medications Prior to Admission medications   Medication Sig Start Date End Date Taking? Authorizing Provider    acetaminophen (TYLENOL) 325 MG tablet Take 325 mg by mouth every 6 (six) hours as needed for moderate pain.   Yes [provider]  albuterol (VENTOLIN HFA) 108 (90 Base) MCG/ACT inhaler Inhale 1 puff into the lungs every 6 (six) hours as needed for wheezing or shortness of breath.  11/10/14  Yes [provider]  ALPRAZolam Prudy Feeler) 0.5 MG tablet Take 0.5 mg by mouth 3 (three) times daily. 05/17/19  Yes [provider]  EPINEPHrine 0.3 mg/0.3 mL IJ SOAJ injection Inject 0.3 mg into the muscle as needed for anaphylaxis.  11/10/14  Yes [provider]  ferrous sulfate 325 (65 FE) MG EC tablet Take 325 mg by mouth daily. 11/10/14  Yes [provider]  gabapentin (NEURONTIN) 300 MG capsule Take 300 mg by mouth 3 (three) times daily. 05/15/19  Yes [provider]  ipratropium-albuterol (DUONEB) 0.5-2.5 (3) MG/3ML SOLN Take 3 mLs by nebulization every 6 (six) hours as needed (shortness of breath).  07/19/19  Yes [provider]  Multiple Vitamins-Minerals (MULTIVITAMIN ADULT PO) Take 1 tablet by mouth daily.  Yes [provider]  oxyCODONE (ROXICODONE) 5 MG immediate release tablet Take 1 tablet (5 mg total) by mouth every 6 (six) hours as needed for up to 20 doses for breakthrough pain. 01/02/20  Yes Curatolo, Adam, DO  predniSONE (DELTASONE) 10 MG tablet Take 4 tablets (40 mg total) by mouth daily with breakfast for 2 days, THEN 3 tablets (30 mg total) daily with breakfast for 3 days, THEN 2 tablets (20 mg total) daily with breakfast for 3 days, THEN 1 tablet (10 mg total) daily with breakfast for 3 days, THEN 0.5 tablets (5 mg total) daily with breakfast for 3 days. 01/17/20 01/31/20 Yes [name], MD  traZODone (DESYREL) 100 MG tablet Take 150 mg by mouth at bedtime as needed for sleep.  05/16/19  Yes [provider]    Allergies    Contrast media [iodinated diagnostic agents], Dilaudid [hydromorphone], Tramadol, Apple, Banana,  Dexamethasone, Ketorolac, Other, Solu-medrol [methylprednisolone], and Toradol [ketorolac tromethamine]  Review of Systems   Review of Systems  Constitutional:       Per HPI, otherwise negative  HENT:       Per HPI, otherwise negative  Respiratory:       Per HPI, otherwise negative  Cardiovascular:       Per HPI, otherwise negative  Gastrointestinal: Negative for vomiting.  Endocrine:       Negative aside from HPI  Genitourinary:       Neg aside from HPI   Musculoskeletal:       Per HPI, otherwise negative  Skin: Negative.   Neurological: Positive for weakness. Negative for syncope.    Physical Exam Updated Vital Signs BP 126/75   Pulse (!) 107   Temp 100 F (37.8 C) (Oral)   Resp 12   Ht 5\' 3"  (1.6 m)   Wt (!) 140 kg   LMP 01/02/2020   SpO2 95%   BMI 54.67 kg/m   Physical Exam Vitals and nursing note reviewed.  Constitutional:      General: She is not in acute distress.    Appearance: She is well-developed. She is obese. She is not toxic-appearing or diaphoretic.  HENT:     Head: Normocephalic and atraumatic.  Eyes:     Conjunctiva/sclera: Conjunctivae normal.  Cardiovascular:     Rate and Rhythm: Regular rhythm. Tachycardia present.  Pulmonary:     Effort: Tachypnea present. No respiratory distress.     Breath sounds: No stridor. Decreased breath sounds present. No wheezing.  Abdominal:     General: There is no distension.  Skin:    General: Skin is warm and dry.  Neurological:     Mental Status: She is alert and oriented to person, place, and time.     Cranial Nerves: No cranial nerve deficit.     ED Results / Procedures / Treatments   Labs (all labs ordered are listed, but only abnormal results are displayed) Labs Reviewed  RESPIRATORY PANEL BY RT PCR (FLU A&B, COVID) - Abnormal; Notable for the following components:      Result Value   SARS Coronavirus 2 by RT PCR POSITIVE (*)    All other components within normal limits  COMPREHENSIVE  METABOLIC PANEL - Abnormal; Notable for the following components:   Calcium 8.5 (*)    Albumin 3.4 (*)    All other components within normal limits  CBC WITH DIFFERENTIAL/PLATELET - Abnormal; Notable for the following components:   Hemoglobin 11.6 (*)    RDW 15.7 (*)  All other components within normal limits  URINALYSIS, ROUTINE W REFLEX MICROSCOPIC - Abnormal; Notable for the following components:   APPearance HAZY (*)    Leukocytes,Ua SMALL (*)    All other components within normal limits  CULTURE, BLOOD (ROUTINE X 2)  CULTURE, BLOOD (ROUTINE X 2)  BRAIN NATRIURETIC PEPTIDE  LACTIC ACID, PLASMA  LACTIC ACID, PLASMA  D-DIMER, QUANTITATIVE (NOT AT [facility])  PROCALCITONIN  LACTATE DEHYDROGENASE  FERRITIN  TRIGLYCERIDES  FIBRINOGEN  C-REACTIVE PROTEIN  I-STAT BETA HCG BLOOD, ED ([facility], [facility], [facility] ONLY)    EKG EKG Interpretation  Date/Time:  Thursday January 18 2020 11:33:41 EST Ventricular Rate:  100 PR Interval:    QRS Duration: 93 QT Interval:  337 QTC Calculation: 435 R Axis:   92 Text Interpretation: Sinus tachycardia Borderline right axis deviation Artifact No significant change since last tracing Abnormal ECG Confirmed by Carmin Muskrat [phone]) on 01/18/2020 12:08:05 PM   Radiology DG Chest Port 1 View  Result Date: 01/18/2020 CLINICAL DATA:  Cough, congestion, weakness for 3 weeks EXAM: PORTABLE CHEST 1 VIEW COMPARISON:  01/13/2020 FINDINGS: The heart size and mediastinal contours are within normal limits. Both lungs are clear. The visualized skeletal structures are unremarkable. IMPRESSION: No active disease. Electronically Signed   By: Randa Ngo M.D.   On: 01/18/2020 10:25    Procedures Procedures (including critical care time)  Medications Ordered in ED Medications  albuterol (VENTOLIN HFA) 108 (90 Base) MCG/ACT inhaler 2 puff (2 puffs Inhalation Given 01/18/20 1138)  acetaminophen (TYLENOL) tablet 1,000 mg (1,000 mg Oral Given 01/18/20 1352)    ED Course  I  have reviewed the triage vital signs and the nursing notes.  Pertinent labs & imaging results that were available during my care of the patient were reviewed by me and considered in my medical decision making (see chart for details).  On repeat exam the patient remains in similar condition, tachypneic, tachycardic, with increased work of breathing and minimal improvement in spite of initial albuterol.  2:17 PM Patient remains uncomfortable appearing, with increased work of breathing, tachypnea, tachycardia. We discussed his labs, notable for positive coronavirus test.  In the context of the patient's declining condition over the past week including during hospitalization, with false negative result earlier during the course of illness, there is suspicion for symptomatic coronavirus infection.  With increased work of breathing, tachypnea, and substantial risk profile including morbid obesity, asthma, COPD and sickle cell disease, the patient will continue to receive fluids, will require hospitalization for further monitoring, management. She has required supplemental oxygen, intermittently throughout her stay. Final Clinical Impression(s) / ED Diagnoses Final diagnoses:  COVID-19 virus infection     Carmin Muskrat, MD 01/18/20 1419

## 2020-01-18 NOTE — ED Notes (Signed)
Report given to RN at [facility]

## 2020-01-19 DIAGNOSIS — I129 Hypertensive chronic kidney disease with stage 1 through stage 4 chronic kidney disease, or unspecified chronic kidney disease: Secondary | ICD-10-CM

## 2020-01-19 DIAGNOSIS — D571 Sickle-cell disease without crisis: Secondary | ICD-10-CM

## 2020-01-19 DIAGNOSIS — E1122 Type 2 diabetes mellitus with diabetic chronic kidney disease: Secondary | ICD-10-CM

## 2020-01-19 LAB — GLUCOSE, CAPILLARY
Glucose-Capillary: 104 mg/dL — ABNORMAL HIGH (ref 70–99)
Glucose-Capillary: 104 mg/dL — ABNORMAL HIGH (ref 70–99)
Glucose-Capillary: 118 mg/dL — ABNORMAL HIGH (ref 70–99)
Glucose-Capillary: 118 mg/dL — ABNORMAL HIGH (ref 70–99)
Glucose-Capillary: 248 mg/dL — ABNORMAL HIGH (ref 70–99)
Glucose-Capillary: 88 mg/dL (ref 70–99)

## 2020-01-19 LAB — CULTURE, BLOOD (ROUTINE X 2): Culture: NO GROWTH

## 2020-01-19 MED ORDER — OXYCODONE HCL 5 MG PO TABS
5.0000 mg | ORAL_TABLET | Freq: Four times a day (QID) | ORAL | Status: DC | PRN
  Administered 2020-01-19 – 2020-01-20 (×2): 5 mg via ORAL
  Filled 2020-01-19 (×2): qty 1

## 2020-01-19 NOTE — Progress Notes (Addendum)
PROGRESS NOTE  Felicia Bradley YPP:[id number] DOB: 1991-04-29 DOA: 01/18/2020  PCP: [name]  Brief History/Interval Summary: 29 y.o. female with medical history significant of asthma, diabetes type 2,  morbid obesity. She states on 01/10/19 she found out her boyfriend was positive for covid. She had been taking care of him. She was already not feeling well-intermittent dizziness, weakness. On 01/12/2020 she called EMS and was taken to [facility] for shortness of breath and chest tightness. She was negative for covid and told her she had an asthma exacerbation. She was admitted and put on bipap for 2.5 days. She states she was given prednisone, fluids during hospital stay. She was discharged on 01/16/20. She states she continued to have shortness of breath at home. She continued to get weaker and had some falls.  She presented back to the hospital.  Noted to have tachypnea tachycardia increased work of breathing.  Patient was hospitalized for further management.  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 Recent CT shows osteonecrosis of the distal femur and lateral tibial condyle with similar appearance and distribution relative to 10/14/2014.   Reason for Visit: COVID-19 infection  Consultants: None  Procedures: None  Antibiotics: Anti-infectives (From admission, onward)   Start     Dose/Rate Route Frequency Ordered Stop   01/19/20 1000  remdesivir 100 mg in sodium chloride 0.9 % 100 mL IVPB     100 mg 200 mL/hr over 30 Minutes Intravenous Daily 01/18/20 1607 01/23/20 0959   01/18/20 1700  remdesivir 200 mg in sodium chloride 0.9% 250 mL IVPB     200 mg 580 mL/hr over 30 Minutes Intravenous Once 01/18/20 1607 01/18/20 1817      Subjective/Interval History: Patient mentions that she is hurting all over.  She states that it is not like her usual sickle cell crises.  She does have a cough and whenever she coughs she has pain in her chest area.   Denies any nausea vomiting.  No abdominal pain.    Assessment/Plan:  COVID-19 infection Chest x-ray did not show any acute process.  Patient is not hypoxic.  However due to her symptoms she has clinically significant COVID-19.  She has respiratory symptoms.  Her comorbidities place her had high risk for decompensation.  Patient started on remdesivir and steroids.  Her D-dimer was unremarkable.  CRP only minimally elevated.  Procalcitonin less than 0.1.  Hold off on antibacterials.  We will give her vitamin C and zinc.  Continue with DVT prophylaxis.  Monitor closely.  Generalized body aches most likely due to COVID-19.  Pleuritic chest pain most likely due to cough.  Could be a component of sickle cell crisis as well.  We will check reticulocytes.  Continue to monitor closely.    Recent Labs  Lab 01/13/20 2052 01/18/20 1011 01/18/20 1412  DDIMER 0.29  --  <0.27  FERRITIN 19  --  30  CRP 2.3*  --  3.1*  ALT 14 19  --   PROCALCITON <0.10  --  <0.10   History of asthma with recent exacerbation Stable currently.  She does have some wheezing.  Use inhalers.  No nebulizer treatments due to COVID-19.  Questionable history of sickle cell anemia/chronic pain Patient mention to me that she was last hospitalized for sickle cell crises back in November.  However I am not able to find any hospitalization either in our system or through care everywhere.  Actually I  am not even able to corroborate a diagnosis of sickle cell anemia.  She is followed by hematology in [facility] and they did not mention this either.  She is followed by pain management specialist.  She has had numerous visits to the ED for back pain.  She has received injection for her pain as well.  Patient apparently also has a history of heroin use in the past.  Currently urine drug screen is negative.  Some of her symptoms could be due to COVID-19.  Diabetes mellitus type 2, diet controlled HbA1c 6.2 recently.  Continue SSI.  Anticipate  some worsening in her glycemic control due to steroids.  History of obstructive sleep apnea No CPAP due to COVID-19 for now.  History of substance abuse Urine drug screen was unremarkable.  History of generalized anxiety disorder/bipolar disorder/insomnia/depression Trazodone and alprazolam being continued.  Apparently also on Abilify.  Dose will need to be verified.  History of avascular necrosis Recently seen by orthopedics in the outpatient setting.  She can follow-up with them at discharge.  Obesity Estimated body mass index is 54.56 kg/m as calculated from the following:   Height as of this encounter: 5\' 3"  (1.6 m).   Weight as of this encounter: 139.7 kg.   DVT Prophylaxis: Lovenox Code Status: Full code Family Communication: Discussed with the patient Disposition Plan: Management as outlined above.  Hopefully home when improved.   Medications:  Scheduled: . albuterol  2 puff Inhalation Q4H  . ALPRAZolam  0.5 mg Oral TID  . vitamin C  500 mg Oral Daily  . enoxaparin (LOVENOX) injection  0.5 mg/kg Subcutaneous Q24H  . insulin aspart  0-20 Units Subcutaneous Q4H  . predniSONE  60 mg Oral Q breakfast  . zinc sulfate  220 mg Oral Daily   Continuous: . sodium chloride 100 mL/hr at 01/19/20 0050  . remdesivir 100 mg in NS 100 mL 100 mg (01/19/20 0905)   YYT:KPTWSFKCLEXNT, guaiFENesin-dextromethorphan, ondansetron **OR** ondansetron (ZOFRAN) IV, traZODone   Objective:  Vital Signs  Vitals:   01/18/20 2100 01/18/20 2325 01/19/20 0332 01/19/20 0724  BP: 112/79  109/86 113/72  Pulse: 86  (!) 114 100  Resp: 17  20 16   Temp:  100.1 F (37.8 C) 100.1 F (37.8 C) 98.2 F (36.8 C)  TempSrc:  Oral Oral Axillary  SpO2: 100%  99% 100%  Weight:  (!) 139.7 kg    Height:  5\' 3"  (1.6 m)      Intake/Output Summary (Last 24 hours) at 01/19/2020 1045 Last data filed at 01/19/2020 0600 Gross per 24 hour  Intake 1580 ml  Output --  Net 1580 ml   Filed Weights   01/18/20  1134 01/18/20 2325  Weight: (!) 140 kg (!) 139.7 kg    General appearance: Awake alert.  In no distress.  Obese Resp: Scattered wheezes bilaterally.  Mildly tachypneic at rest.  Crackles at the bases bilaterally. Cardio: S1-S2 is normal regular.  No S3-S4.  No rubs murmurs or bruit GI: Abdomen is soft.  Nontender nondistended.  Bowel sounds are present normal.  No masses organomegaly Extremities: No edema.  Full range of motion of lower extremities. Neurologic: Alert and oriented x3.  No focal neurological deficits.    Lab Results:  Data Reviewed: I have personally reviewed following labs and imaging studies  CBC: Recent Labs  Lab 01/13/20 2052 01/13/20 2120 01/15/20 0713 01/16/20 0832 01/18/20 1011  WBC 8.6  --  14.0* 10.4 7.7  NEUTROABS 4.5  --   --   --  4.4  HGB 12.0 12.6 11.2* 11.4* 11.6*  HCT 39.1 37.0 37.5 37.6 37.4  MCV 88.5  --  90.1 88.5 88.0  PLT 462*  --  441* 453* 320    Basic Metabolic Panel: Recent Labs  Lab 01/13/20 2052 01/13/20 2052 01/13/20 2120 01/14/20 0400 01/15/20 0713 01/16/20 0832 01/18/20 1011  NA 139   < > 142 138 141 140 139  K 4.6   < > 3.9 4.1 4.5 3.6 3.8  CL 105  --   --  103 108 105 103  CO2 26  --   --  18* 26 25 28   GLUCOSE 109*  --   --  249* 153* 123* 95  BUN 12  --   --  11 15 16 15   CREATININE 0.86  --   --  0.90 0.78 0.72 0.83  CALCIUM 9.4  --   --  9.1 9.4 9.0 8.5*   < > = values in this interval not displayed.    GFR: Estimated Creatinine Clearance: 137.8 mL/min (by C-G formula based on SCr of 0.83 mg/dL).  Liver Function Tests: Recent Labs  Lab 01/13/20 2052 01/18/20 1011  AST 26 15  ALT 14 19  ALKPHOS 71 62  BILITOT 0.6 0.5  PROT 6.9 7.0  ALBUMIN 3.4* 3.4*    CBG: Recent Labs  Lab 01/18/20 1642 01/18/20 1918 01/19/20 0029 01/19/20 0421 01/19/20 0728  GLUCAP 76 88 104* 104* 88    Lipid Profile: Recent Labs    01/18/20 1412  TRIG 72    Anemia Panel: Recent Labs    01/18/20 1412    FERRITIN 30    Recent Results (from the past 240 hour(s))  Blood Culture (routine x 2)     Status: None   Collection Time: 01/13/20  8:13 PM   Specimen: BLOOD  Result Value Ref Range Status   Specimen Description BLOOD RIGHT ARM  Final   Special Requests   Final    BOTTLES DRAWN AEROBIC AND ANAEROBIC Blood Culture results may not be optimal due to an excessive volume of blood received in culture bottles   Culture   Final    NO GROWTH 5 DAYS Performed at [facility], [address] [place], [place] [id number]    Report Status 01/18/2020 FINAL  Final  Respiratory Panel by RT PCR (Flu A&B, Covid) - Nasopharyngeal Swab     Status: None   Collection Time: 01/14/20 12:39 AM   Specimen: Nasopharyngeal Swab  Result Value Ref Range Status   SARS Coronavirus 2 by RT PCR NEGATIVE NEGATIVE Final    Comment: (NOTE) SARS-CoV-2 target nucleic acids are NOT DETECTED. The SARS-CoV-2 RNA is generally detectable in upper respiratoy specimens during the acute phase of infection. The lowest concentration of SARS-CoV-2 viral copies this assay can detect is 131 copies/mL. A negative result does not preclude SARS-Cov-2 infection and should not be used as the sole basis for treatment or other patient management decisions. A negative result may occur with  improper specimen collection/handling, submission of specimen other than nasopharyngeal swab, presence of viral mutation(s) within the areas targeted by this assay, and inadequate number of viral copies (<131 copies/mL). A negative result must be combined with clinical observations, patient history, and epidemiological information. The expected result is Negative. Fact Sheet for Patients:  https://www.moore.com/https://www.fda.gov/media/142436/download Fact Sheet for Healthcare Providers:  https://www.young.biz/https://www.fda.gov/media/142435/download This test is not yet ap proved or cleared by the [place] FDA and  has been authorized for detection  and/or diagnosis of  SARS-CoV-2 by FDA under an Emergency Use Authorization (EUA). This EUA will remain  in effect (meaning this test can be used) for the duration of the COVID-19 declaration under Section 564(b)(1) of the Act, 21 U.S.C. section 360bbb-3(b)(1), unless the authorization is terminated or revoked sooner.    Influenza A by PCR NEGATIVE NEGATIVE Final   Influenza B by PCR NEGATIVE NEGATIVE Final    Comment: (NOTE) The Xpert Xpress SARS-CoV-2/FLU/RSV assay is intended as an aid in  the diagnosis of influenza from Nasopharyngeal swab specimens and  should not be used as a sole basis for treatment. Nasal washings and  aspirates are unacceptable for Xpert Xpress SARS-CoV-2/FLU/RSV  testing. Fact Sheet for Patients: https://www.moore.com/ Fact Sheet for Healthcare Providers: https://www.young.biz/ This test is not yet approved or cleared by the [place] FDA and  has been authorized for detection and/or diagnosis of SARS-CoV-2 by  FDA under an Emergency Use Authorization (EUA). This EUA will remain  in effect (meaning this test can be used) for the duration of the  Covid-19 declaration under Section 564(b)(1) of the Act, 21  U.S.C. section 360bbb-3(b)(1), unless the authorization is  terminated or revoked. Performed at [facility], [address], [place], [place] [address]   MRSA PCR Screening     Status: None   Collection Time: 01/14/20  6:45 AM   Specimen: Nasopharyngeal  Result Value Ref Range Status   MRSA by PCR NEGATIVE NEGATIVE Final    Comment:        The GeneXpert MRSA Assay (FDA approved for NASAL specimens only), is one component of a comprehensive MRSA colonization surveillance program. It is not intended to diagnose MRSA infection nor to guide or monitor treatment for MRSA infections. Performed at [facility], [address] [place], [place] [address]   Respiratory Panel by PCR     Status: None   Collection Time:  01/14/20  6:45 AM   Specimen: Nasopharyngeal Swab; Respiratory  Result Value Ref Range Status   Adenovirus NOT DETECTED NOT DETECTED Final   Coronavirus 229E NOT DETECTED NOT DETECTED Final    Comment: (NOTE) The Coronavirus on the Respiratory Panel, DOES NOT test for the novel  Coronavirus (2019 nCoV)    Coronavirus HKU1 NOT DETECTED NOT DETECTED Final   Coronavirus NL63 NOT DETECTED NOT DETECTED Final   Coronavirus OC43 NOT DETECTED NOT DETECTED Final   Metapneumovirus NOT DETECTED NOT DETECTED Final   Rhinovirus / Enterovirus NOT DETECTED NOT DETECTED Final   Influenza A NOT DETECTED NOT DETECTED Final   Influenza B NOT DETECTED NOT DETECTED Final   Parainfluenza Virus 1 NOT DETECTED NOT DETECTED Final   Parainfluenza Virus 2 NOT DETECTED NOT DETECTED Final   Parainfluenza Virus 3 NOT DETECTED NOT DETECTED Final   Parainfluenza Virus 4 NOT DETECTED NOT DETECTED Final   Respiratory Syncytial Virus NOT DETECTED NOT DETECTED Final   Bordetella pertussis NOT DETECTED NOT DETECTED Final   Chlamydophila pneumoniae NOT DETECTED NOT DETECTED Final   Mycoplasma pneumoniae NOT DETECTED NOT DETECTED Final    Comment: Performed at [facility], [address] [place], [place] [address]  Blood Culture (routine x 2)     Status: None (Preliminary result)   Collection Time: 01/14/20  7:05 AM   Specimen: BLOOD RIGHT HAND  Result Value Ref Range Status   Specimen Description BLOOD RIGHT HAND  Final   Special Requests   Final    BOTTLES DRAWN AEROBIC  AND ANAEROBIC Blood Culture results may not be optimal due to an inadequate volume of blood received in culture bottles   Culture   Final    NO GROWTH 4 DAYS Performed at [facility], [address], [place], [place] [address]    Report Status PENDING  Incomplete  SARS CORONAVIRUS 2 (TAT 6-24 HRS) Nasopharyngeal Nasopharyngeal Swab     Status: None   Collection Time: 01/14/20 11:52 AM   Specimen: Nasopharyngeal Swab  Result Value  Ref Range Status   SARS Coronavirus 2 NEGATIVE NEGATIVE Final    Comment: (NOTE) SARS-CoV-2 target nucleic acids are NOT DETECTED. The SARS-CoV-2 RNA is generally detectable in upper and lower respiratory specimens during the acute phase of infection. Negative results do not preclude SARS-CoV-2 infection, do not rule out co-infections with other pathogens, and should not be used as the sole basis for treatment or other patient management decisions. Negative results must be combined with clinical observations, patient history, and epidemiological information. The expected result is Negative. Fact Sheet for Patients: HairSlick.no Fact Sheet for Healthcare Providers: quierodirigir.com This test is not yet approved or cleared by the [place] FDA and  has been authorized for detection and/or diagnosis of SARS-CoV-2 by FDA under an Emergency Use Authorization (EUA). This EUA will remain  in effect (meaning this test can be used) for the duration of the COVID-19 declaration under Section 56 4(b)(1) of the Act, 21 U.S.C. section 360bbb-3(b)(1), unless the authorization is terminated or revoked sooner. Performed at [facility], [address] [place], [place] [address]   Respiratory Panel by RT PCR (Flu A&B, Covid) - Nasopharyngeal Swab     Status: Abnormal   Collection Time: 01/18/20 10:12 AM   Specimen: Nasopharyngeal Swab  Result Value Ref Range Status   SARS Coronavirus 2 by RT PCR POSITIVE (A) NEGATIVE Final    Comment: RESULT CALLED TO, READ BACK BY AND VERIFIED WITH: HALL,C. RN @1351  ON 02.04.2021 BY COHEN,K (NOTE) SARS-CoV-2 target nucleic acids are DETECTED. SARS-CoV-2 RNA is generally detectable in upper respiratory specimens  during the acute phase of infection. Positive results are indicative of the presence of the identified virus, but do not rule out bacterial infection or co-infection with other pathogens  not detected by the test. Clinical correlation with patient history and other diagnostic information is necessary to determine patient infection status. The expected result is Negative. Fact Sheet for Patients:  04.04.2021 Fact Sheet for Healthcare Providers: https://www.moore.com/ This test is not yet approved or cleared by the https://www.young.biz/ FDA and  has been authorized for detection and/or diagnosis of SARS-CoV-2 by FDA under an Emergency Use Authorization (EUA).  This EUA will remain in effect (meaning this test can be u sed) for the duration of  the COVID-19 declaration under Section 564(b)(1) of the Act, 21 U.S.C. section 360bbb-3(b)(1), unless the authorization is terminated or revoked sooner.    Influenza A by PCR NEGATIVE NEGATIVE Final   Influenza B by PCR NEGATIVE NEGATIVE Final    Comment: (NOTE) The Xpert Xpress SARS-CoV-2/FLU/RSV assay is intended as an aid in  the diagnosis of influenza from Nasopharyngeal swab specimens and  should not be used as a sole basis for treatment. Nasal washings and  aspirates are unacceptable for Xpert Xpress SARS-CoV-2/FLU/RSV  testing. Fact Sheet for Patients: [place] Fact Sheet for Healthcare Providers: https://www.moore.com/ This test is not yet approved or cleared by the https://www.young.biz/ FDA and  has been authorized for detection and/or diagnosis of  SARS-CoV-2 by  FDA under an Emergency Use Authorization (EUA). This EUA will remain  in effect (meaning this test can be used) for the duration of the  Covid-19 declaration under Section 564(b)(1) of the Act, 21  U.S.C. section 360bbb-3(b)(1), unless the authorization is  terminated or revoked. Performed at [facility], [address] [place], [place] [address]   Blood Culture (routine x 2)     Status: None (Preliminary result)   Collection Time: 01/18/20  2:57 PM    Specimen: BLOOD  Result Value Ref Range Status   Specimen Description   Final    BLOOD LEFT ANTECUBITAL Performed at [facility], [address], [place], [place] [address]    Special Requests   Final    BOTTLES DRAWN AEROBIC AND ANAEROBIC Blood Culture results may not be optimal due to an excessive volume of blood received in culture bottles Performed at [facility], [address] [place], [place] [address]    Culture PENDING  Incomplete   Report Status PENDING  Incomplete      Radiology Studies: DG Chest Port 1 View  Result Date: 01/18/2020 CLINICAL DATA:  Cough, congestion, weakness for 3 weeks EXAM: PORTABLE CHEST 1 VIEW COMPARISON:  01/13/2020 FINDINGS: The heart size and mediastinal contours are within normal limits. Both lungs are clear. The visualized skeletal structures are unremarkable. IMPRESSION: No active disease. Electronically Signed   By: Sharlet Salina M.D.   On: 01/18/2020 10:25       LOS: 1 day   Osvaldo Shipper  [facility] Pager on [email or web address]  01/19/2020, 10:45 AM

## 2020-01-19 NOTE — Progress Notes (Signed)
Nutrition Brief Note  Pt with PMH of asthma, DM, sickle cell, AVN of her right knee, and morbid obesity. Recently hospitalized for asthma exacerbation now admitted for SOB due to COVID-19.  Plan for supplemental O2 as needed, oral steroids, and remdesivir.   Wt Readings from Last 15 Encounters:  01/18/20 (!) 139.7 kg  01/14/20 (!) 140 kg  01/02/20 123 kg  10/25/19 123.8 kg  08/02/18 123.8 kg  09/05/17 127 kg  02/17/16 132 kg    Body mass index is 54.56 kg/m. Patient meets criteria for morbid obesity based on current BMI.   Current diet order is Heart Healthy/CHO modified which is appropriate at this time.   Despite need to modify weight as an outpatient it is important for her to consume adequate nutrition while admitted. Recommend outpatient education after d/c as appropriate.   Cammy Copa., RD, LDN, CNSC See AMiON for contact information

## 2020-01-19 NOTE — Plan of Care (Signed)
RN update pt's mother, Rhae Hammock, via phone on plan of care, VS, and pt progress. VSS on RA. Pt weak with decreased appetite. Purewick placed. Remdesivir dose 2 of 5 given. Will continue POC.   Problem: Education: Goal: Knowledge of General Education information will improve Description: Including pain rating scale, medication(s)/side effects and non-pharmacologic comfort measures 01/19/2020 1218 by Emilia Beck, RN Outcome: Progressing 01/19/2020 1211 by Emilia Beck, RN Outcome: Progressing   Problem: Health Behavior/Discharge Planning: Goal: Ability to manage health-related needs will improve 01/19/2020 1218 by Emilia Beck, RN Outcome: Progressing 01/19/2020 1211 by Emilia Beck, RN Outcome: Progressing   Problem: Clinical Measurements: Goal: Ability to maintain clinical measurements within normal limits will improve 01/19/2020 1218 by Emilia Beck, RN Outcome: Progressing 01/19/2020 1211 by Emilia Beck, RN Outcome: Progressing Goal: Diagnostic test results will improve 01/19/2020 1218 by Emilia Beck, RN Outcome: Progressing 01/19/2020 1211 by Emilia Beck, RN Outcome: Progressing Goal: Respiratory complications will improve 01/19/2020 1218 by Emilia Beck, RN Outcome: Progressing 01/19/2020 1211 by Emilia Beck, RN Outcome: Progressing Goal: Cardiovascular complication will be avoided 01/19/2020 1218 by Emilia Beck, RN Outcome: Progressing 01/19/2020 1211 by Emilia Beck, RN Outcome: Progressing   Problem: Coping: Goal: Level of anxiety will decrease 01/19/2020 1218 by Emilia Beck, RN Outcome: Progressing 01/19/2020 1211 by Emilia Beck, RN Outcome: Progressing   Problem: Elimination: Goal: Will not experience complications related to bowel motility 01/19/2020 1218 by Emilia Beck, RN Outcome: Progressing 01/19/2020 1211 by Emilia Beck, RN Outcome: Progressing Goal: Will not experience complications related to  urinary retention 01/19/2020 1218 by Emilia Beck, RN Outcome: Progressing 01/19/2020 1211 by Emilia Beck, RN Outcome: Progressing   Problem: Pain Managment: Goal: General experience of comfort will improve 01/19/2020 1218 by Emilia Beck, RN Outcome: Progressing 01/19/2020 1211 by Emilia Beck, RN Outcome: Progressing   Problem: Safety: Goal: Ability to remain free from injury will improve 01/19/2020 1218 by Emilia Beck, RN Outcome: Progressing 01/19/2020 1211 by Emilia Beck, RN Outcome: Progressing   Problem: Skin Integrity: Goal: Risk for impaired skin integrity will decrease 01/19/2020 1218 by Emilia Beck, RN Outcome: Progressing 01/19/2020 1211 by Emilia Beck, RN Outcome: Progressing   Problem: Clinical Measurements: Goal: Will remain free from infection 01/19/2020 1218 by Emilia Beck, RN Outcome: Not Progressing 01/19/2020 1211 by Emilia Beck, RN Outcome: Not Progressing   Problem: Activity: Goal: Risk for activity intolerance will decrease 01/19/2020 1218 by Emilia Beck, RN Outcome: Not Progressing 01/19/2020 1211 by Emilia Beck, RN Outcome: Not Progressing   Problem: Nutrition: Goal: Adequate nutrition will be maintained 01/19/2020 1218 by Emilia Beck, RN Outcome: Not Progressing 01/19/2020 1211 by Emilia Beck, RN Outcome: Not Progressing

## 2020-01-20 LAB — COMPREHENSIVE METABOLIC PANEL
ALT: 20 U/L (ref 0–44)
AST: 18 U/L (ref 15–41)
Albumin: 3.1 g/dL — ABNORMAL LOW (ref 3.5–5.0)
Alkaline Phosphatase: 58 U/L (ref 38–126)
Anion gap: 15 (ref 5–15)
BUN: 12 mg/dL (ref 6–20)
CO2: 21 mmol/L — ABNORMAL LOW (ref 22–32)
Calcium: 8.3 mg/dL — ABNORMAL LOW (ref 8.9–10.3)
Chloride: 103 mmol/L (ref 98–111)
Creatinine, Ser: 0.7 mg/dL (ref 0.44–1.00)
GFR calc Af Amer: >60 mL/min (ref >60)
GFR calc non Af Amer: >60 mL/min (ref >60)
Glucose, Bld: 106 mg/dL — ABNORMAL HIGH (ref 70–99)
Potassium: 4 mmol/L (ref 3.5–5.1)
Sodium: 139 mmol/L (ref 135–145)
Total Bilirubin: 0.4 mg/dL (ref 0.3–1.2)
Total Protein: 6.8 g/dL (ref 6.5–8.1)

## 2020-01-20 LAB — CBC WITH DIFFERENTIAL/PLATELET
Abs Immature Granulocytes: 0.04 10*3/uL (ref 0.00–0.07)
Basophils Absolute: 0 10*3/uL (ref 0.0–0.1)
Basophils Relative: 0 %
Eosinophils Absolute: 0 10*3/uL (ref 0.0–0.5)
Eosinophils Relative: 0 %
HCT: 36.1 % (ref 36.0–46.0)
Hemoglobin: 10.9 g/dL — ABNORMAL LOW (ref 12.0–15.0)
Immature Granulocytes: 1 %
Lymphocytes Relative: 38 %
Lymphs Abs: 2.5 10*3/uL (ref 0.7–4.0)
MCH: 26.5 pg (ref 26.0–34.0)
MCHC: 30.2 g/dL (ref 30.0–36.0)
MCV: 87.8 fL (ref 80.0–100.0)
Monocytes Absolute: 0.6 10*3/uL (ref 0.1–1.0)
Monocytes Relative: 8 %
Neutro Abs: 3.5 10*3/uL (ref 1.7–7.7)
Neutrophils Relative %: 53 %
Platelets: 358 10*3/uL (ref 150–400)
RBC: 4.11 MIL/uL (ref 3.87–5.11)
RDW: 15.5 % (ref 11.5–15.5)
WBC: 6.6 10*3/uL (ref 4.0–10.5)
nRBC: 0 % (ref 0.0–0.2)

## 2020-01-20 LAB — C-REACTIVE PROTEIN: CRP: 3 mg/dL — ABNORMAL HIGH (ref <1.0)

## 2020-01-20 LAB — GLUCOSE, CAPILLARY
Glucose-Capillary: 142 mg/dL — ABNORMAL HIGH (ref 70–99)
Glucose-Capillary: 111 mg/dL — ABNORMAL HIGH (ref 70–99)
Glucose-Capillary: 84 mg/dL (ref 70–99)
Glucose-Capillary: 129 mg/dL — ABNORMAL HIGH (ref 70–99)
Glucose-Capillary: 187 mg/dL — ABNORMAL HIGH (ref 70–99)

## 2020-01-20 LAB — D-DIMER, QUANTITATIVE: D-Dimer, Quant: 0.3 ug/mL-FEU (ref 0.00–0.50)

## 2020-01-20 MED ORDER — TRAZODONE HCL 50 MG PO TABS
50.0000 mg | ORAL_TABLET | Freq: Every evening | ORAL | Status: DC | PRN
  Administered 2020-01-21: 03:00:00 50 mg via ORAL
  Filled 2020-01-20: qty 1

## 2020-01-20 MED ORDER — ALPRAZOLAM 0.25 MG PO TABS
0.2500 mg | ORAL_TABLET | Freq: Three times a day (TID) | ORAL | Status: DC | PRN
  Administered 2020-01-20: 0.25 mg via ORAL
  Filled 2020-01-20: qty 1

## 2020-01-20 MED ORDER — FLUTICASONE PROPIONATE HFA 44 MCG/ACT IN AERO
2.0000 | INHALATION_SPRAY | Freq: Two times a day (BID) | RESPIRATORY_TRACT | Status: DC
  Administered 2020-01-20 – 2020-01-22 (×5): 2 via RESPIRATORY_TRACT
  Filled 2020-01-20: qty 10.6

## 2020-01-20 NOTE — Progress Notes (Signed)
PROGRESS NOTE  Felicia Bradley KZL:[id number] DOB: 12/07/1991 DOA: 01/18/2020  PCP: [facility]  Brief History/Interval Summary: 29 y.o. female with medical history significant of asthma, diabetes type 2,  morbid obesity. She states on 01/10/19 she found out her boyfriend was positive for covid. She had been taking care of him. She was already not feeling well-intermittent dizziness, weakness. On 01/12/2020 she called EMS and was taken to [facility] for shortness of breath and chest tightness. She was negative for covid and told her she had an asthma exacerbation. She was admitted and put on bipap for 2.5 days. She states she was given prednisone, fluids during hospital stay. She was discharged on 01/16/20. She states she continued to have shortness of breath at home. She continued to get weaker and had some falls.  She presented back to the hospital.  Noted to have tachypnea tachycardia increased work of breathing.  Patient was hospitalized for further management.  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 Recent CT shows osteonecrosis of the distal femur and lateral tibial condyle with similar appearance and distribution relative to 10/14/2014.   Reason for Visit: COVID-19 infection  Consultants: None  Procedures: None  Antibiotics: Anti-infectives (From admission, onward)   Start     Dose/Rate Route Frequency Ordered Stop   01/19/20 1000  remdesivir 100 mg in sodium chloride 0.9 % 100 mL IVPB     100 mg 200 mL/hr over 30 Minutes Intravenous Daily 01/18/20 1607 01/23/20 0959   01/18/20 1700  remdesivir 200 mg in sodium chloride 0.9% 250 mL IVPB     200 mg 580 mL/hr over 30 Minutes Intravenous Once 01/18/20 1607 01/18/20 1817      Subjective/Interval History: Patient mentions that she continues to feel fatigued.  Feels unsteady when she walks.  Some shortness of breath with walking.  No nausea or vomiting.  No abdominal pain.  She was asked about  sickle cell.  She tells me that her doctor in [place] told her about sickle cell.  However she was vague about this.  She was adamant that she had blood work done.      Assessment/Plan:  COVID-19 infection Chest x-ray did not show any acute process.  Patient continues to saturate in the 90s on room air.  She was placed on remdesivir and steroids due to clinically significant symptoms.  Continue with dose for now.  Her inflammatory markers only minimally elevated.  Procalcitonin was less than 0.1.  Hold off on antibacterials.  She is wheezing some.  She does have a history of asthma.  Continue with inhalers.  Pleuritic chest pain with cough most likely due to COVID-19.     Recent Labs  Lab 01/13/20 2052 01/18/20 1011 01/18/20 1412 01/20/20 0234  DDIMER 0.29  --  <0.27 0.30  FERRITIN 19  --  30  --   CRP 2.3*  --  3.1* 3.0*  ALT 14 19  --  20  PROCALCITON <0.10  --  <0.10  --    History of asthma with recent exacerbation Stable currently.  She does have some wheezing.  Use inhalers.  No nebulizer treatments due to COVID-19.  Questionable history of sickle cell anemia/chronic pain syndrome Is adamant about her history of sickle cell.  She tells me that she was recently told by outpatient providers in [place].  Her mother however does not know of any such history.  Hemoglobinopathy evaluation has been ordered  and is pending.  Patient followed by pain specialist as well.  Recently had injection for back pain.  She has been seen in ED numerous times for pain related issues.  Patient has multiple psychiatric issues as per mother.  There could be some drug-seeking behavior here.  She is noted to be somnolent today.  We will cut back on her narcotics and her sedative agents.  Patient also with history of heroin use in the past although her current urine drug screen is negative.    Diabetes mellitus type 2, diet controlled HbA1c 6.2 recently.  Continue SSI.  Anticipate some worsening in her  glycemic control due to steroids.  History of obstructive sleep apnea No CPAP due to COVID-19 for now.  History of substance abuse Urine drug screen was unremarkable.  History of generalized anxiety disorder/bipolar disorder/insomnia/depression Cut back on dose of trazodone.  Stop alprazolam due to sedation.  Apparently also on Abilify.  Hold for now due to sedation.  History of avascular necrosis Recently seen by orthopedics in the outpatient setting.  She can follow-up with them at discharge.  Obesity Estimated body mass index is 54.56 kg/m as calculated from the following:   Height as of this encounter: 5\' 3"  (1.6 m).   Weight as of this encounter: 139.7 kg.   DVT Prophylaxis: Lovenox Code Status: Full code Family Communication: Discussed with the patient Disposition Plan: Management as outlined above.  Hopefully home when improved.   Medications:  Scheduled: . albuterol  2 puff Inhalation Q4H  . vitamin C  500 mg Oral Daily  . enoxaparin (LOVENOX) injection  0.5 mg/kg Subcutaneous Q24H  . insulin aspart  0-20 Units Subcutaneous Q4H  . predniSONE  60 mg Oral Q breakfast  . zinc sulfate  220 mg Oral Daily   Continuous: . sodium chloride 50 mL/hr at 01/19/20 1609  . remdesivir 100 mg in NS 100 mL 100 mg (01/20/20 0813)   03/19/20, ALPRAZolam, guaiFENesin-dextromethorphan, ondansetron **OR** ondansetron (ZOFRAN) IV, oxyCODONE, traZODone   Objective:  Vital Signs  Vitals:   01/19/20 1539 01/19/20 2000 01/20/20 0400 01/20/20 0800  BP: 122/89 105/70 122/71 101/62  Pulse: (!) 104 78 95   Resp: 20 (!) 22 20 18   Temp: 97.9 F (36.6 C) 98.9 F (37.2 C) 98.9 F (37.2 C)   TempSrc: Axillary Oral Oral   SpO2: 99% 95% 97%   Weight:      Height:        Intake/Output Summary (Last 24 hours) at 01/20/2020 1111 Last data filed at 01/20/2020 0436 Gross per 24 hour  Intake 1570.54 ml  Output 1450 ml  Net 120.54 ml   Filed Weights   01/18/20 1134 01/18/20 2325   Weight: (!) 140 kg (!) 139.7 kg    General appearance: Awake alert.  In no distress.  Obese Resp: Scattered wheezing heard bilaterally.  Normal effort.  No rhonchi.   Cardio: S1-S2 is normal regular.  No S3-S4.  No rubs murmurs or bruit GI: Abdomen is soft.  Nontender nondistended.  Bowel sounds are present normal.  No masses organomegaly Extremities: No edema.  Full range of motion of lower extremities. Neurologic: Somnolent but easily arousable.  No focal deficits.    Lab Results:  Data Reviewed: I have personally reviewed following labs and imaging studies  CBC: Recent Labs  Lab 01/13/20 2052 01/13/20 2052 01/13/20 2120 01/15/20 0713 01/16/20 0832 01/18/20 1011 01/20/20 0234  WBC 8.6  --   --  14.0* 10.4 7.7 6.6  NEUTROABS 4.5  --   --   --   --  4.4 3.5  HGB 12.0   < > 12.6 11.2* 11.4* 11.6* 10.9*  HCT 39.1   < > 37.0 37.5 37.6 37.4 36.1  MCV 88.5  --   --  90.1 88.5 88.0 87.8  PLT 462*  --   --  441* 453* 320 358   < > = values in this interval not displayed.    Basic Metabolic Panel: Recent Labs  Lab 01/14/20 0400 01/15/20 0713 01/16/20 0832 01/18/20 1011 01/20/20 0234  NA 138 141 140 139 139  K 4.1 4.5 3.6 3.8 4.0  CL 103 108 105 103 103  CO2 18* 26 25 28  21*  GLUCOSE 249* 153* 123* 95 106*  BUN 11 15 16 15 12   CREATININE 0.90 0.78 0.72 0.83 0.70  CALCIUM 9.1 9.4 9.0 8.5* 8.3*    GFR: Estimated Creatinine Clearance: 143 mL/min (by C-G formula based on SCr of 0.7 mg/dL).  Liver Function Tests: Recent Labs  Lab 01/13/20 2052 01/18/20 1011 01/20/20 0234  AST 26 15 18   ALT 14 19 20   ALKPHOS 71 62 58  BILITOT 0.6 0.5 0.4  PROT 6.9 7.0 6.8  ALBUMIN 3.4* 3.4* 3.1*    CBG: Recent Labs  Lab 01/19/20 1544 01/19/20 2142 01/20/20 0103 01/20/20 0426 01/20/20 0721  GLUCAP 248* 118* 142* 111* 84    Lipid Profile: Recent Labs    01/18/20 1412  TRIG 72    Anemia Panel: Recent Labs    01/18/20 1412  FERRITIN 30    Recent Results  (from the past 240 hour(s))  Blood Culture (routine x 2)     Status: None   Collection Time: 01/13/20  8:13 PM   Specimen: BLOOD  Result Value Ref Range Status   Specimen Description BLOOD RIGHT ARM  Final   Special Requests   Final    BOTTLES DRAWN AEROBIC AND ANAEROBIC Blood Culture results may not be optimal due to an excessive volume of blood received in culture bottles   Culture   Final    NO GROWTH 5 DAYS Performed at [facility], [place] [address], [place], [place] [address]    Report Status 01/18/2020 FINAL  Final  Respiratory Panel by RT PCR (Flu A&B, Covid) - Nasopharyngeal Swab     Status: None   Collection Time: 01/14/20 12:39 AM   Specimen: Nasopharyngeal Swab  Result Value Ref Range Status   SARS Coronavirus 2 by RT PCR NEGATIVE NEGATIVE Final    Comment: (NOTE) SARS-CoV-2 target nucleic acids are NOT DETECTED. The SARS-CoV-2 RNA is generally detectable in upper respiratoy specimens during the acute phase of infection. The lowest concentration of SARS-CoV-2 viral copies this assay can detect is 131 copies/mL. A negative result does not preclude SARS-Cov-2 infection and should not be used as the sole basis for treatment or other patient management decisions. A negative result may occur with  improper specimen collection/handling, submission of specimen other than nasopharyngeal swab, presence of viral mutation(s) within the areas targeted by this assay, and inadequate number of viral copies (<131 copies/mL). A negative result must be combined with clinical observations, patient history, and epidemiological information. The expected result is Negative. Fact Sheet for Patients:  PinkCheek.be Fact Sheet for Healthcare Providers:  GravelBags.it This test is not yet ap proved or cleared by the [place] FDA and  has been authorized for detection and/or diagnosis of SARS-CoV-2 by FDA under an Emergency Use  Authorization (EUA). This EUA will remain  in effect (meaning this test can be used) for the duration of the COVID-19 declaration under Section 564(b)(1) of the Act, 21 U.S.C. section 360bbb-3(b)(1), unless the authorization is terminated or revoked sooner.    Influenza A by PCR NEGATIVE NEGATIVE Final   Influenza B by PCR NEGATIVE NEGATIVE Final    Comment: (NOTE) The Xpert Xpress SARS-CoV-2/FLU/RSV assay is intended as an aid in  the diagnosis of influenza from Nasopharyngeal swab specimens and  should not be used as a sole basis for treatment. Nasal washings and  aspirates are unacceptable for Xpert Xpress SARS-CoV-2/FLU/RSV  testing. Fact Sheet for Patients: https://www.moore.com/ Fact Sheet for Healthcare Providers: https://www.young.biz/ This test is not yet approved or cleared by the [place] FDA and  has been authorized for detection and/or diagnosis of SARS-CoV-2 by  FDA under an Emergency Use Authorization (EUA). This EUA will remain  in effect (meaning this test can be used) for the duration of the  Covid-19 declaration under Section 564(b)(1) of the Act, 21  U.S.C. section 360bbb-3(b)(1), unless the authorization is  terminated or revoked. Performed at [facility], [address] [place], [place] [address]   MRSA PCR Screening     Status: None   Collection Time: 01/14/20  6:45 AM   Specimen: Nasopharyngeal  Result Value Ref Range Status   MRSA by PCR NEGATIVE NEGATIVE Final    Comment:        The GeneXpert MRSA Assay (FDA approved for NASAL specimens only), is one component of a comprehensive MRSA colonization surveillance program. It is not intended to diagnose MRSA infection nor to guide or monitor treatment for MRSA infections. Performed at [facility], [address] [place], [place] [address]   Respiratory Panel by PCR     Status: None   Collection Time: 01/14/20  6:45 AM   Specimen:  Nasopharyngeal Swab; Respiratory  Result Value Ref Range Status   Adenovirus NOT DETECTED NOT DETECTED Final   Coronavirus 229E NOT DETECTED NOT DETECTED Final    Comment: (NOTE) The Coronavirus on the Respiratory Panel, DOES NOT test for the novel  Coronavirus (2019 nCoV)    Coronavirus HKU1 NOT DETECTED NOT DETECTED Final   Coronavirus NL63 NOT DETECTED NOT DETECTED Final   Coronavirus OC43 NOT DETECTED NOT DETECTED Final   Metapneumovirus NOT DETECTED NOT DETECTED Final   Rhinovirus / Enterovirus NOT DETECTED NOT DETECTED Final   Influenza A NOT DETECTED NOT DETECTED Final   Influenza B NOT DETECTED NOT DETECTED Final   Parainfluenza Virus 1 NOT DETECTED NOT DETECTED Final   Parainfluenza Virus 2 NOT DETECTED NOT DETECTED Final   Parainfluenza Virus 3 NOT DETECTED NOT DETECTED Final   Parainfluenza Virus 4 NOT DETECTED NOT DETECTED Final   Respiratory Syncytial Virus NOT DETECTED NOT DETECTED Final   Bordetella pertussis NOT DETECTED NOT DETECTED Final   Chlamydophila pneumoniae NOT DETECTED NOT DETECTED Final   Mycoplasma pneumoniae NOT DETECTED NOT DETECTED Final    Comment: Performed at [facility], [address] [place], [place] [address]  Blood Culture (routine x 2)     Status: None   Collection Time: 01/14/20  7:05 AM   Specimen: BLOOD RIGHT HAND  Result Value Ref Range Status   Specimen Description BLOOD RIGHT HAND  Final   Special Requests   Final    BOTTLES DRAWN AEROBIC AND ANAEROBIC Blood Culture results may not be optimal due to an inadequate  volume of blood received in culture bottles   Culture   Final    NO GROWTH 5 DAYS Performed at [facility], [address] [place], [place] [address]    Report Status 01/19/2020 FINAL  Final  SARS CORONAVIRUS 2 (TAT 6-24 HRS) Nasopharyngeal Nasopharyngeal Swab     Status: None   Collection Time: 01/14/20 11:52 AM   Specimen: Nasopharyngeal Swab  Result Value Ref Range Status   SARS Coronavirus 2 NEGATIVE  NEGATIVE Final    Comment: (NOTE) SARS-CoV-2 target nucleic acids are NOT DETECTED. The SARS-CoV-2 RNA is generally detectable in upper and lower respiratory specimens during the acute phase of infection. Negative results do not preclude SARS-CoV-2 infection, do not rule out co-infections with other pathogens, and should not be used as the sole basis for treatment or other patient management decisions. Negative results must be combined with clinical observations, patient history, and epidemiological information. The expected result is Negative. Fact Sheet for Patients: HairSlick.nohttps://www.fda.gov/media/138098/download Fact Sheet for Healthcare Providers: quierodirigir.comhttps://www.fda.gov/media/138095/download This test is not yet approved or cleared by the [place] FDA and  has been authorized for detection and/or diagnosis of SARS-CoV-2 by FDA under an Emergency Use Authorization (EUA). This EUA will remain  in effect (meaning this test can be used) for the duration of the COVID-19 declaration under Section 56 4(b)(1) of the Act, 21 U.S.C. section 360bbb-3(b)(1), unless the authorization is terminated or revoked sooner. Performed at [facility], [address] [place], [place] [address]   Respiratory Panel by RT PCR (Flu A&B, Covid) - Nasopharyngeal Swab     Status: Abnormal   Collection Time: 01/18/20 10:12 AM   Specimen: Nasopharyngeal Swab  Result Value Ref Range Status   SARS Coronavirus 2 by RT PCR POSITIVE (A) NEGATIVE Final    Comment: RESULT CALLED TO, READ BACK BY AND VERIFIED WITH: HALL,C. RN @1351  ON 02.04.2021 BY COHEN,K (NOTE) SARS-CoV-2 target nucleic acids are DETECTED. SARS-CoV-2 RNA is generally detectable in upper respiratory specimens  during the acute phase of infection. Positive results are indicative of the presence of the identified virus, but do not rule out bacterial infection or co-infection with other pathogens not detected by the test. Clinical correlation  with patient history and other diagnostic information is necessary to determine patient infection status. The expected result is Negative. Fact Sheet for Patients:  https://www.moore.com/https://www.fda.gov/media/142436/download Fact Sheet for Healthcare Providers: https://www.young.biz/https://www.fda.gov/media/142435/download This test is not yet approved or cleared by the [place] FDA and  has been authorized for detection and/or diagnosis of SARS-CoV-2 by FDA under an Emergency Use Authorization (EUA).  This EUA will remain in effect (meaning this test can be u sed) for the duration of  the COVID-19 declaration under Section 564(b)(1) of the Act, 21 U.S.C. section 360bbb-3(b)(1), unless the authorization is terminated or revoked sooner.    Influenza A by PCR NEGATIVE NEGATIVE Final   Influenza B by PCR NEGATIVE NEGATIVE Final    Comment: (NOTE) The Xpert Xpress SARS-CoV-2/FLU/RSV assay is intended as an aid in  the diagnosis of influenza from Nasopharyngeal swab specimens and  should not be used as a sole basis for treatment. Nasal washings and  aspirates are unacceptable for Xpert Xpress SARS-CoV-2/FLU/RSV  testing. Fact Sheet for Patients: https://www.moore.com/https://www.fda.gov/media/142436/download Fact Sheet for Healthcare Providers: https://www.young.biz/https://www.fda.gov/media/142435/download This test is not yet approved or cleared by the [place] FDA and  has been authorized for detection and/or diagnosis of SARS-CoV-2 by  FDA under an Emergency Use Authorization (EUA). This EUA  will remain  in effect (meaning this test can be used) for the duration of the  Covid-19 declaration under Section 564(b)(1) of the Act, 21  U.S.C. section 360bbb-3(b)(1), unless the authorization is  terminated or revoked. Performed at [facility], [address] [place], [place] [address]   Blood Culture (routine x 2)     Status: None (Preliminary result)   Collection Time: 01/18/20  2:57 PM   Specimen: BLOOD  Result Value Ref Range Status    Specimen Description   Final    BLOOD LEFT ANTECUBITAL Performed at [facility], [address], [place], [place] [address]    Special Requests   Final    BOTTLES DRAWN AEROBIC AND ANAEROBIC Blood Culture results may not be optimal due to an excessive volume of blood received in culture bottles   Culture   Final    NO GROWTH 2 DAYS Performed at [facility], [address] [place], [place] [address]    Report Status PENDING  Incomplete  Blood Culture (routine x 2)     Status: None (Preliminary result)   Collection Time: 01/18/20  2:57 PM   Specimen: BLOOD  Result Value Ref Range Status   Specimen Description   Final    BLOOD RIGHT ANTECUBITAL Performed at [facility], [address] [place], [place] [address]    Special Requests   Final    BOTTLES DRAWN AEROBIC AND ANAEROBIC Blood Culture results may not be optimal due to an inadequate volume of blood received in culture bottles Performed at [facility], [address] [place], [place] [address]    Culture   Final    NO GROWTH 2 DAYS Performed at [facility], [address] [place], [place] [address]    Report Status PENDING  Incomplete      Radiology Studies: No results found.     LOS: 2 days   Jaceyon Strole Rito Ehrlich  [facility] Pager on [email or web address]  01/20/2020, 11:11 AM

## 2020-01-20 NOTE — Progress Notes (Addendum)
This morning, MD into see patient, MD instructed patient to be out of bed to chair most of the day.   So far, patient refusing to get out of bed for the tech Brandi and this Chartered loss adjuster.  Will try again, and the [employer] will try again.    Bonita Quin RN and I came into room to get patient oob, found patient sound asleep, diaphretic,  BG 129, BP 115/82, map 85.  Patient woke upin a very sleepy groggy.    Bonita Quin RN and I assisted patient to [facility], then to chair.    Patient awake, and very sleepy.

## 2020-01-20 NOTE — Progress Notes (Signed)
Acknowledging TOC consult for meds to bed. Patient has Medicaid - $3 copay per med.   Hortencia Conradi, RN MSN CCM Transitions of Care [phone]

## 2020-01-20 NOTE — Plan of Care (Signed)
Patient refusing to get out of bed this am.  Patient very anxious,  Gave patient Xanax .25 MG po, and robitussin for cough.  Patient became very heavely sleepy, BP 115/82, BG 129.  Discussed with MD,  then MD discountinued the meds that can sedate a patient.  Finally Bonita Quin RN and this Chartered loss adjuster assisted patient oob to [facility], then to chair.   Later in day patient got herself up out of chair to the bathroom.    Patient has poor appetite.  Patient talking on the phone a lot to her support system.     Problem: Education: Goal: Knowledge of General Education information will improve Description: Including pain rating scale, medication(s)/side effects and non-pharmacologic comfort measures Outcome: Progressing   Problem: Health Behavior/Discharge Planning: Goal: Ability to manage health-related needs will improve Outcome: Progressing   Problem: Clinical Measurements: Goal: Ability to maintain clinical measurements within normal limits will improve Outcome: Progressing Goal: Will remain free from infection Outcome: Progressing Goal: Diagnostic test results will improve Outcome: Progressing Goal: Respiratory complications will improve Outcome: Progressing Goal: Cardiovascular complication will be avoided Outcome: Progressing   Problem: Activity: Goal: Risk for activity intolerance will decrease Outcome: Progressing   Problem: Nutrition: Goal: Adequate nutrition will be maintained Outcome: Progressing   Problem: Coping: Goal: Level of anxiety will decrease Outcome: Progressing   Problem: Elimination: Goal: Will not experience complications related to bowel motility Outcome: Progressing Goal: Will not experience complications related to urinary retention Outcome: Progressing   Problem: Pain Managment: Goal: General experience of comfort will improve Outcome: Progressing   Problem: Safety: Goal: Ability to remain free from injury will improve Outcome: Progressing   Problem: Skin  Integrity: Goal: Risk for impaired skin integrity will decrease Outcome: Progressing

## 2020-01-20 NOTE — Progress Notes (Signed)
Pt educated extensively on importance of ambulation, cough and deep breathing. Pt seems to only want to sleep and eat. Pt wakes up to asks for pain meds when due and has scheduled anxiety medication that makes her groggy. Pt initially had purewick in and writer removed it after pt wet the bed while sleeping. Advised that purewick will not be replaced d/t pt opportunity to ambulate to RR. Pt did used walker with writer there for assistance, pt tolerated but not too well due to effects of medication. Full linen and gown changed this shift. Pt voicing sentences writer is unable to interpret. Bed low with call bell w/in reach

## 2020-01-21 LAB — COMPREHENSIVE METABOLIC PANEL
ALT: 22 U/L (ref 0–44)
AST: 14 U/L — ABNORMAL LOW (ref 15–41)
Albumin: 3.3 g/dL — ABNORMAL LOW (ref 3.5–5.0)
Alkaline Phosphatase: 59 U/L (ref 38–126)
Anion gap: 8 (ref 5–15)
BUN: 16 mg/dL (ref 6–20)
CO2: 26 mmol/L (ref 22–32)
Calcium: 8.6 mg/dL — ABNORMAL LOW (ref 8.9–10.3)
Chloride: 105 mmol/L (ref 98–111)
Creatinine, Ser: 0.66 mg/dL (ref 0.44–1.00)
GFR calc Af Amer: >60 mL/min (ref >60)
GFR calc non Af Amer: >60 mL/min (ref >60)
Glucose, Bld: 78 mg/dL (ref 70–99)
Potassium: 3.7 mmol/L (ref 3.5–5.1)
Sodium: 139 mmol/L (ref 135–145)
Total Bilirubin: 0.4 mg/dL (ref 0.3–1.2)
Total Protein: 7 g/dL (ref 6.5–8.1)

## 2020-01-21 LAB — RETICULOCYTES
Immature Retic Fract: 21.6 % — ABNORMAL HIGH (ref 2.3–15.9)
RBC.: 4.1 MIL/uL (ref 3.87–5.11)
Retic Count, Absolute: 68.1 10*3/uL (ref 19.0–186.0)
Retic Ct Pct: 1.7 % (ref 0.4–3.1)

## 2020-01-21 LAB — GLUCOSE, CAPILLARY
Glucose-Capillary: 95 mg/dL (ref 70–99)
Glucose-Capillary: 153 mg/dL — ABNORMAL HIGH (ref 70–99)
Glucose-Capillary: 98 mg/dL (ref 70–99)
Glucose-Capillary: 142 mg/dL — ABNORMAL HIGH (ref 70–99)

## 2020-01-21 LAB — CBC WITH DIFFERENTIAL/PLATELET
Abs Immature Granulocytes: 0.03 10*3/uL (ref 0.00–0.07)
Basophils Absolute: 0 10*3/uL (ref 0.0–0.1)
Basophils Relative: 0 %
Eosinophils Absolute: 0 10*3/uL (ref 0.0–0.5)
Eosinophils Relative: 0 %
HCT: 36.1 % (ref 36.0–46.0)
Hemoglobin: 11.1 g/dL — ABNORMAL LOW (ref 12.0–15.0)
Immature Granulocytes: 0 %
Lymphocytes Relative: 48 %
Lymphs Abs: 3.7 10*3/uL (ref 0.7–4.0)
MCH: 27.2 pg (ref 26.0–34.0)
MCHC: 30.7 g/dL (ref 30.0–36.0)
MCV: 88.5 fL (ref 80.0–100.0)
Monocytes Absolute: 0.6 10*3/uL (ref 0.1–1.0)
Monocytes Relative: 7 %
Neutro Abs: 3.5 10*3/uL (ref 1.7–7.7)
Neutrophils Relative %: 45 %
Platelets: 359 10*3/uL (ref 150–400)
RBC: 4.08 MIL/uL (ref 3.87–5.11)
RDW: 15.7 % — ABNORMAL HIGH (ref 11.5–15.5)
WBC: 7.9 10*3/uL (ref 4.0–10.5)
nRBC: 0 % (ref 0.0–0.2)

## 2020-01-21 MED ORDER — PREDNISONE 20 MG PO TABS
40.0000 mg | ORAL_TABLET | Freq: Every day | ORAL | Status: DC
  Administered 2020-01-22: 40 mg via ORAL
  Filled 2020-01-21: qty 2

## 2020-01-21 MED ORDER — PREDNISONE 20 MG PO TABS: 60.0000 mg | ORAL_TABLET | ORAL | Status: DC

## 2020-01-21 MED ORDER — GABAPENTIN 300 MG PO CAPS
300.0000 mg | ORAL_CAPSULE | Freq: Three times a day (TID) | ORAL | Status: DC
  Administered 2020-01-21 – 2020-01-22 (×2): 300 mg via ORAL
  Filled 2020-01-21 (×2): qty 1

## 2020-01-21 MED ORDER — ARIPIPRAZOLE 5 MG PO TABS
5.0000 mg | ORAL_TABLET | Freq: Every day | ORAL | Status: DC
  Administered 2020-01-22: 5 mg via ORAL
  Filled 2020-01-21 (×2): qty 1

## 2020-01-21 MED ORDER — TRAZODONE HCL 50 MG PO TABS
25.0000 mg | ORAL_TABLET | Freq: Every evening | ORAL | Status: DC | PRN
  Administered 2020-01-21: 22:00:00 25 mg via ORAL
  Filled 2020-01-21: qty 1

## 2020-01-21 NOTE — Progress Notes (Signed)
PROGRESS NOTE  Felicia Bradley HKV:[id number] DOB: 05-09-1991 DOA: 01/18/2020  PCP: [name]  Brief History/Interval Summary: 29 y.o. female with medical history significant of asthma, diabetes type 2,  morbid obesity. She states on 01/10/19 she found out her boyfriend was positive for covid. She had been taking care of him. She was already not feeling well-intermittent dizziness, weakness. On 01/12/2020 she called EMS and was taken to [facility] for shortness of breath and chest tightness. She was negative for covid and told her she had an asthma exacerbation. She was admitted and put on bipap for 2.5 days. She states she was given prednisone, fluids during hospital stay. She was discharged on 01/16/20. She states she continued to have shortness of breath at home. She continued to get weaker and had some falls.  She presented back to the hospital.  Noted to have tachypnea tachycardia increased work of breathing.  Patient was hospitalized for further management.  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 Recent CT shows osteonecrosis of the distal femur and lateral tibial condyle with similar appearance and distribution relative to 10/14/2014.   Reason for Visit: COVID-19 infection  Consultants: None  Procedures: None  Antibiotics: Anti-infectives (From admission, onward)   Start     Dose/Rate Route Frequency Ordered Stop   01/19/20 1000  remdesivir 100 mg in sodium chloride 0.9 % 100 mL IVPB     100 mg 200 mL/hr over 30 Minutes Intravenous Daily 01/18/20 1607 01/23/20 0959   01/18/20 1700  remdesivir 200 mg in sodium chloride 0.9% 250 mL IVPB     200 mg 580 mL/hr over 30 Minutes Intravenous Once 01/18/20 1607 01/18/20 1817      Subjective/Interval History: Patient noted to be somnolent this morning.  Easily arousable.  Denies any new complaints.  Continues to feel fatigued.  Some shortness of breath with exertion.  No nausea vomiting.       Assessment/Plan:  COVID-19 infection Chest x-ray did not show any acute process.  Patient remains on remdesivir and steroids.  She continues to saturate normal on room air.  Inflammatory markers were only minimally elevated.  Continue with inhalers for wheezing.  Mobilize.  Incentive spirometry.  Patient not very compliant with mobilization efforts by nursing staff.  Patient was told that she needs to ambulate.  Recent Labs  Lab 01/18/20 1011 01/18/20 1412 01/20/20 0234 01/21/20 0320  DDIMER  --  <0.27 0.30  --   FERRITIN  --  30  --   --   CRP  --  3.1* 3.0*  --   ALT 19  --  20 22  PROCALCITON  --  <0.10  --   --    History of asthma with recent exacerbation Wheezing less today compared to yesterday.  Continue inhalers.    Questionable history of sickle cell anemia/chronic pain syndrome Patient is adamant about her history of sickle cell.  She tells me that she was recently told by outpatient providers in [place].  Her mother however does not know of any such history.  Hemoglobinopathy evaluation has been ordered and is pending.  Patient followed by pain specialist as well.  Recently had injection for back pain.  She has been seen in ED numerous times for pain related issues.  Patient has multiple psychiatric issues as per mother.  There could be some drug-seeking behavior here.    Diabetes mellitus type 2, diet controlled HbA1c 6.2 recently.  CBGs are reasonably well controlled.  Continue to monitor.  History of obstructive sleep apnea No CPAP due to COVID-19 for now.  History of substance abuse Urine drug screen was unremarkable.  History of generalized anxiety disorder/bipolar disorder/insomnia/depression Somnolence noted.  All sedative agents and narcotics were discontinued.  We will stop trazodone as well.    History of avascular necrosis Recently seen by orthopedics in the outpatient setting.  She can follow-up with them at discharge.  Obesity Estimated  body mass index is 54.56 kg/m as calculated from the following:   Height as of this encounter: 5\' 3"  (1.6 m).   Weight as of this encounter: 139.7 kg.   DVT Prophylaxis: Lovenox Code Status: Full code Family Communication: Discussed with the patient Disposition Plan: Management as outlined above.  Hopefully home when improved.   Medications:  Scheduled: . albuterol  2 puff Inhalation Q4H  . vitamin C  500 mg Oral Daily  . enoxaparin (LOVENOX) injection  0.5 mg/kg Subcutaneous Q24H  . fluticasone  2 puff Inhalation BID  . insulin aspart  0-20 Units Subcutaneous Q4H  . predniSONE  60 mg Oral Q breakfast  . zinc sulfate  220 mg Oral Daily   Continuous: . sodium chloride 50 mL/hr (01/20/20 1329)  . remdesivir 100 mg in NS 100 mL 100 mg (01/20/20 0813)   03/19/20, guaiFENesin-dextromethorphan, ondansetron **OR** ondansetron (ZOFRAN) IV, traZODone   Objective:  Vital Signs  Vitals:   01/21/20 0200 01/21/20 0724 01/21/20 1123 01/21/20 1127  BP: 98/63 105/72    Pulse: 77     Resp: 14 17    Temp: 97.8 F (36.6 C) 98.3 F (36.8 C) 98.3 F (36.8 C) 98.3 F (36.8 C)  TempSrc: Oral Oral Oral Oral  SpO2: 95% 96%    Weight:      Height:        Intake/Output Summary (Last 24 hours) at 01/21/2020 1139 Last data filed at 01/21/2020 0800 Gross per 24 hour  Intake --  Output 400 ml  Net -400 ml   Filed Weights   01/18/20 1134 01/18/20 2325  Weight: (!) 140 kg (!) 139.7 kg    General appearance: Awake alert.  In no distress.  Obese Resp: Normal effort at rest.  No wheezing heard today. Cardio: S1-S2 is normal regular.  No S3-S4.  No rubs murmurs or bruit GI: Abdomen is soft.  Nontender nondistended.  Bowel sounds are present normal.  No masses organomegaly Extremities: No edema.  Full range of motion of lower extremities. Neurologic: Somnolent but easily arousable.  No obvious focal deficits.      Lab Results:  Data Reviewed: I have personally reviewed  following labs and imaging studies  CBC: Recent Labs  Lab 01/15/20 0713 01/16/20 0832 01/18/20 1011 01/20/20 0234 01/21/20 0320  WBC 14.0* 10.4 7.7 6.6 7.9  NEUTROABS  --   --  4.4 3.5 3.5  HGB 11.2* 11.4* 11.6* 10.9* 11.1*  HCT 37.5 37.6 37.4 36.1 36.1  MCV 90.1 88.5 88.0 87.8 88.5  PLT 441* 453* 320 358 359    Basic Metabolic Panel: Recent Labs  Lab 01/15/20 0713 01/16/20 0832 01/18/20 1011 01/20/20 0234 01/21/20 0320  NA 141 140 139 139 139  K 4.5 3.6 3.8 4.0 3.7  CL 108 105 103 103 105  CO2 26 25 28  21* 26  GLUCOSE 153* 123* 95 106* 78  BUN 15 16 15 12 16   CREATININE 0.78 0.72 0.83 0.70 0.66  CALCIUM 9.4 9.0 8.5* 8.3* 8.6*  GFR: Estimated Creatinine Clearance: 143 mL/min (by C-G formula based on SCr of 0.66 mg/dL).  Liver Function Tests: Recent Labs  Lab 01/18/20 1011 01/20/20 0234 01/21/20 0320  AST 15 18 14*  ALT 19 20 22   ALKPHOS 62 58 59  BILITOT 0.5 0.4 0.4  PROT 7.0 6.8 7.0  ALBUMIN 3.4* 3.1* 3.3*    CBG: Recent Labs  Lab 01/20/20 1141 01/20/20 1956 01/21/20 0254 01/21/20 0430 01/21/20 0719  GLUCAP 129* 187* 95 153* 98    Lipid Profile: Recent Labs    01/18/20 1412  TRIG 72    Anemia Panel: Recent Labs    01/18/20 1412 01/21/20 0320  FERRITIN 30  --   RETICCTPCT  --  1.7    Recent Results (from the past 240 hour(s))  Blood Culture (routine x 2)     Status: None   Collection Time: 01/13/20  8:13 PM   Specimen: BLOOD  Result Value Ref Range Status   Specimen Description BLOOD RIGHT ARM  Final   Special Requests   Final    BOTTLES DRAWN AEROBIC AND ANAEROBIC Blood Culture results may not be optimal due to an excessive volume of blood received in culture bottles   Culture   Final    NO GROWTH 5 DAYS Performed at [facility], [address] [place], [place]    Report Status 01/18/2020 FINAL  Final  Respiratory Panel by RT PCR (Flu A&B, Covid) - Nasopharyngeal Swab     Status: None   Collection Time:  01/14/20 12:39 AM   Specimen: Nasopharyngeal Swab  Result Value Ref Range Status   SARS Coronavirus 2 by RT PCR NEGATIVE NEGATIVE Final    Comment: (NOTE) SARS-CoV-2 target nucleic acids are NOT DETECTED. The SARS-CoV-2 RNA is generally detectable in upper respiratoy specimens during the acute phase of infection. The lowest concentration of SARS-CoV-2 viral copies this assay can detect is 131 copies/mL. A negative result does not preclude SARS-Cov-2 infection and should not be used as the sole basis for treatment or other patient management decisions. A negative result may occur with  improper specimen collection/handling, submission of specimen other than nasopharyngeal swab, presence of viral mutation(s) within the areas targeted by this assay, and inadequate number of viral copies (<131 copies/mL). A negative result must be combined with clinical observations, patient history, and epidemiological information. The expected result is Negative. Fact Sheet for Patients:  01/16/20 Fact Sheet for Healthcare Providers:  https://www.moore.com/ This test is not yet ap proved or cleared by the https://www.young.biz/ FDA and  has been authorized for detection and/or diagnosis of SARS-CoV-2 by FDA under an Emergency Use Authorization (EUA). This EUA will remain  in effect (meaning this test can be used) for the duration of the COVID-19 declaration under Section 564(b)(1) of the Act, 21 U.S.C. section 360bbb-3(b)(1), unless the authorization is terminated or revoked sooner.    Influenza A by PCR NEGATIVE NEGATIVE Final   Influenza B by PCR NEGATIVE NEGATIVE Final    Comment: (NOTE) The Xpert Xpress SARS-CoV-2/FLU/RSV assay is intended as an aid in  the diagnosis of influenza from Nasopharyngeal swab specimens and  should not be used as a sole basis for treatment. Nasal washings and  aspirates are unacceptable for Xpert Xpress SARS-CoV-2/FLU/RSV   testing. Fact Sheet for Patients: [place] Fact Sheet for Healthcare Providers: https://www.moore.com/ This test is not yet approved or cleared by the https://www.young.biz/ FDA and  has been authorized for detection and/or diagnosis of SARS-CoV-2  by  FDA under an Emergency Use Authorization (EUA). This EUA will remain  in effect (meaning this test can be used) for the duration of the  Covid-19 declaration under Section 564(b)(1) of the Act, 21  U.S.C. section 360bbb-3(b)(1), unless the authorization is  terminated or revoked. Performed at [facility], [address], [place], [place] [address]   MRSA PCR Screening     Status: None   Collection Time: 01/14/20  6:45 AM   Specimen: Nasopharyngeal  Result Value Ref Range Status   MRSA by PCR NEGATIVE NEGATIVE Final    Comment:        The GeneXpert MRSA Assay (FDA approved for NASAL specimens only), is one component of a comprehensive MRSA colonization surveillance program. It is not intended to diagnose MRSA infection nor to guide or monitor treatment for MRSA infections. Performed at [facility], [address], [place], [place] [address]   Respiratory Panel by PCR     Status: None   Collection Time: 01/14/20  6:45 AM   Specimen: Nasopharyngeal Swab; Respiratory  Result Value Ref Range Status   Adenovirus NOT DETECTED NOT DETECTED Final   Coronavirus 229E NOT DETECTED NOT DETECTED Final    Comment: (NOTE) The Coronavirus on the Respiratory Panel, DOES NOT test for the novel  Coronavirus (2019 nCoV)    Coronavirus HKU1 NOT DETECTED NOT DETECTED Final   Coronavirus NL63 NOT DETECTED NOT DETECTED Final   Coronavirus OC43 NOT DETECTED NOT DETECTED Final   Metapneumovirus NOT DETECTED NOT DETECTED Final   Rhinovirus / Enterovirus NOT DETECTED NOT DETECTED Final   Influenza A NOT DETECTED NOT DETECTED Final   Influenza B NOT DETECTED NOT DETECTED Final   Parainfluenza  Virus 1 NOT DETECTED NOT DETECTED Final   Parainfluenza Virus 2 NOT DETECTED NOT DETECTED Final   Parainfluenza Virus 3 NOT DETECTED NOT DETECTED Final   Parainfluenza Virus 4 NOT DETECTED NOT DETECTED Final   Respiratory Syncytial Virus NOT DETECTED NOT DETECTED Final   Bordetella pertussis NOT DETECTED NOT DETECTED Final   Chlamydophila pneumoniae NOT DETECTED NOT DETECTED Final   Mycoplasma pneumoniae NOT DETECTED NOT DETECTED Final    Comment: Performed at [facility], [address] [place], [place] [address]  Blood Culture (routine x 2)     Status: None   Collection Time: 01/14/20  7:05 AM   Specimen: BLOOD RIGHT HAND  Result Value Ref Range Status   Specimen Description BLOOD RIGHT HAND  Final   Special Requests   Final    BOTTLES DRAWN AEROBIC AND ANAEROBIC Blood Culture results may not be optimal due to an inadequate volume of blood received in culture bottles   Culture   Final    NO GROWTH 5 DAYS Performed at [facility], [address] [place], [place] [address]    Report Status 01/19/2020 FINAL  Final  SARS CORONAVIRUS 2 (TAT 6-24 HRS) Nasopharyngeal Nasopharyngeal Swab     Status: None   Collection Time: 01/14/20 11:52 AM   Specimen: Nasopharyngeal Swab  Result Value Ref Range Status   SARS Coronavirus 2 NEGATIVE NEGATIVE Final    Comment: (NOTE) SARS-CoV-2 target nucleic acids are NOT DETECTED. The SARS-CoV-2 RNA is generally detectable in upper and lower respiratory specimens during the acute phase of infection. Negative results do not preclude SARS-CoV-2 infection, do not rule out co-infections with other pathogens, and should not be used as the sole basis for treatment or other patient management  decisions. Negative results must be combined with clinical observations, patient history, and epidemiological information. The expected result is Negative. Fact Sheet for Patients: HairSlick.nohttps://www.fda.gov/media/138098/download Fact Sheet for Healthcare  Providers: quierodirigir.comhttps://www.fda.gov/media/138095/download This test is not yet approved or cleared by the [place] FDA and  has been authorized for detection and/or diagnosis of SARS-CoV-2 by FDA under an Emergency Use Authorization (EUA). This EUA will remain  in effect (meaning this test can be used) for the duration of the COVID-19 declaration under Section 56 4(b)(1) of the Act, 21 U.S.C. section 360bbb-3(b)(1), unless the authorization is terminated or revoked sooner. Performed at [facility], [address] [place], [place] [address]   Respiratory Panel by RT PCR (Flu A&B, Covid) - Nasopharyngeal Swab     Status: Abnormal   Collection Time: 01/18/20 10:12 AM   Specimen: Nasopharyngeal Swab  Result Value Ref Range Status   SARS Coronavirus 2 by RT PCR POSITIVE (A) NEGATIVE Final    Comment: RESULT CALLED TO, READ BACK BY AND VERIFIED WITH: HALL,C. RN @1351  ON 02.04.2021 BY COHEN,K (NOTE) SARS-CoV-2 target nucleic acids are DETECTED. SARS-CoV-2 RNA is generally detectable in upper respiratory specimens  during the acute phase of infection. Positive results are indicative of the presence of the identified virus, but do not rule out bacterial infection or co-infection with other pathogens not detected by the test. Clinical correlation with patient history and other diagnostic information is necessary to determine patient infection status. The expected result is Negative. Fact Sheet for Patients:  https://www.moore.com/https://www.fda.gov/media/142436/download Fact Sheet for Healthcare Providers: https://www.young.biz/https://www.fda.gov/media/142435/download This test is not yet approved or cleared by the [place] FDA and  has been authorized for detection and/or diagnosis of SARS-CoV-2 by FDA under an Emergency Use Authorization (EUA).  This EUA will remain in effect (meaning this test can be u sed) for the duration of  the COVID-19 declaration under Section 564(b)(1) of the Act, 21 U.S.C. section  360bbb-3(b)(1), unless the authorization is terminated or revoked sooner.    Influenza A by PCR NEGATIVE NEGATIVE Final   Influenza B by PCR NEGATIVE NEGATIVE Final    Comment: (NOTE) The Xpert Xpress SARS-CoV-2/FLU/RSV assay is intended as an aid in  the diagnosis of influenza from Nasopharyngeal swab specimens and  should not be used as a sole basis for treatment. Nasal washings and  aspirates are unacceptable for Xpert Xpress SARS-CoV-2/FLU/RSV  testing. Fact Sheet for Patients: https://www.moore.com/https://www.fda.gov/media/142436/download Fact Sheet for Healthcare Providers: https://www.young.biz/https://www.fda.gov/media/142435/download This test is not yet approved or cleared by the [place] FDA and  has been authorized for detection and/or diagnosis of SARS-CoV-2 by  FDA under an Emergency Use Authorization (EUA). This EUA will remain  in effect (meaning this test can be used) for the duration of the  Covid-19 declaration under Section 564(b)(1) of the Act, 21  U.S.C. section 360bbb-3(b)(1), unless the authorization is  terminated or revoked. Performed at [facility], [address] [place], [place] [address]   Blood Culture (routine x 2)     Status: None (Preliminary result)   Collection Time: 01/18/20  2:57 PM   Specimen: BLOOD  Result Value Ref Range Status   Specimen Description   Final    BLOOD LEFT ANTECUBITAL Performed at [facility], [address] [place], [place] [address]    Special Requests   Final    BOTTLES DRAWN AEROBIC AND ANAEROBIC Blood Culture results may not be optimal due to an excessive volume of blood received in culture bottles  Culture   Final    NO GROWTH 3 DAYS Performed at [facility], [address], [place], [place] [address]    Report Status PENDING  Incomplete  Blood Culture (routine x 2)     Status: None (Preliminary result)   Collection Time: 01/18/20  2:57 PM   Specimen: BLOOD  Result Value Ref Range Status   Specimen  Description   Final    BLOOD RIGHT ANTECUBITAL Performed at [facility], [address], [place], [place] [address]    Special Requests   Final    BOTTLES DRAWN AEROBIC AND ANAEROBIC Blood Culture results may not be optimal due to an inadequate volume of blood received in culture bottles Performed at [facility], [address] [place], [place] [address]    Culture   Final    NO GROWTH 3 DAYS Performed at [facility], [address] [place], [place] [address]    Report Status PENDING  Incomplete      Radiology Studies: No results found.     LOS: 3 days   [employer] on [email or web address]  01/21/2020, 11:39 AM

## 2020-01-21 NOTE — Plan of Care (Signed)

## 2020-01-21 NOTE — Plan of Care (Signed)
Patient had trazadone last night, make patient over sedated and patient slept till noon 1200 today, see previous note regarding attempting to provide care for patient.   Patient oob, in wheel chair to visit her boyfriend in room 158.   Patient requested zofran 4 MG PO given before she eats her dinner.   Done.  Patient in better mood after she wakes up in midday.    Problem: Education: Goal: Knowledge of General Education information will improve Description: Including pain rating scale, medication(s)/side effects and non-pharmacologic comfort measures Outcome: Progressing   Problem: Health Behavior/Discharge Planning: Goal: Ability to manage health-related needs will improve Outcome: Progressing   Problem: Clinical Measurements: Goal: Ability to maintain clinical measurements within normal limits will improve Outcome: Progressing Goal: Will remain free from infection Outcome: Progressing Goal: Diagnostic test results will improve Outcome: Progressing Goal: Respiratory complications will improve Outcome: Progressing Goal: Cardiovascular complication will be avoided Outcome: Progressing   Problem: Activity: Goal: Risk for activity intolerance will decrease Outcome: Progressing   Problem: Nutrition: Goal: Adequate nutrition will be maintained Outcome: Progressing   Problem: Coping: Goal: Level of anxiety will decrease Outcome: Progressing   Problem: Elimination: Goal: Will not experience complications related to bowel motility Outcome: Progressing Goal: Will not experience complications related to urinary retention Outcome: Progressing   Problem: Pain Managment: Goal: General experience of comfort will improve Outcome: Progressing   Problem: Safety: Goal: Ability to remain free from injury will improve Outcome: Progressing   Problem: Skin Integrity: Goal: Risk for impaired skin integrity will decrease Outcome: Progressing

## 2020-01-21 NOTE — Progress Notes (Signed)
0725 Resumed care of patient this am in very groggy sleepy state.  Patient would push me my hands and arms away when attempting to provide care for patient, example, getting temperature,, let patient take her own temp.  Putting the BP cuff on and getting patient to lay on her back to get a correct BP. Patient stated don't touch me, just leave my pills here and I will take them.   1130 came back attempted again to get a temp and BP, again let patient take her own temp.  BP was a problem getting patient to lay on her back.   Sent note via sure chat to MD.

## 2020-01-22 ENCOUNTER — Encounter (HOSPITAL_COMMUNITY): Payer: Self-pay | Admitting: Family Medicine

## 2020-01-22 DIAGNOSIS — Z765 Malingerer [conscious simulation]: Secondary | ICD-10-CM

## 2020-01-22 LAB — HEMOGLOBINOPATHY EVALUATION
Hgb A2 Quant: 2 % (ref 1.8–3.2)
Hgb A: 98 % (ref 96.4–98.8)
Hgb C: 0 %
Hgb F Quant: 0 % (ref 0.0–2.0)
Hgb S Quant: 0 %
Hgb Variant: 0 %

## 2020-01-22 LAB — CBC WITH DIFFERENTIAL/PLATELET
Abs Immature Granulocytes: 0.06 10*3/uL (ref 0.00–0.07)
Basophils Absolute: 0 10*3/uL (ref 0.0–0.1)
Basophils Relative: 0 %
Eosinophils Absolute: 0 10*3/uL (ref 0.0–0.5)
Eosinophils Relative: 0 %
HCT: 35.7 % — ABNORMAL LOW (ref 36.0–46.0)
Hemoglobin: 10.8 g/dL — ABNORMAL LOW (ref 12.0–15.0)
Immature Granulocytes: 1 %
Lymphocytes Relative: 39 %
Lymphs Abs: 3.8 10*3/uL (ref 0.7–4.0)
MCH: 27 pg (ref 26.0–34.0)
MCHC: 30.3 g/dL (ref 30.0–36.0)
MCV: 89.3 fL (ref 80.0–100.0)
Monocytes Absolute: 0.6 10*3/uL (ref 0.1–1.0)
Monocytes Relative: 6 %
Neutro Abs: 5.4 10*3/uL (ref 1.7–7.7)
Neutrophils Relative %: 54 %
Platelets: 363 10*3/uL (ref 150–400)
RBC: 4 MIL/uL (ref 3.87–5.11)
RDW: 15.9 % — ABNORMAL HIGH (ref 11.5–15.5)
WBC: 9.7 10*3/uL (ref 4.0–10.5)
nRBC: 0 % (ref 0.0–0.2)

## 2020-01-22 LAB — COMPREHENSIVE METABOLIC PANEL
ALT: 18 U/L (ref 0–44)
AST: 13 U/L — ABNORMAL LOW (ref 15–41)
Albumin: 3 g/dL — ABNORMAL LOW (ref 3.5–5.0)
Alkaline Phosphatase: 52 U/L (ref 38–126)
Anion gap: 10 (ref 5–15)
BUN: 14 mg/dL (ref 6–20)
CO2: 26 mmol/L (ref 22–32)
Calcium: 8.7 mg/dL — ABNORMAL LOW (ref 8.9–10.3)
Chloride: 105 mmol/L (ref 98–111)
Creatinine, Ser: 0.6 mg/dL (ref 0.44–1.00)
GFR calc Af Amer: >60 mL/min (ref >60)
GFR calc non Af Amer: >60 mL/min (ref >60)
Glucose, Bld: 74 mg/dL (ref 70–99)
Potassium: 4 mmol/L (ref 3.5–5.1)
Sodium: 141 mmol/L (ref 135–145)
Total Bilirubin: <0.1 mg/dL — ABNORMAL LOW (ref 0.3–1.2)
Total Protein: 6.3 g/dL — ABNORMAL LOW (ref 6.5–8.1)

## 2020-01-22 LAB — GLUCOSE, CAPILLARY
Glucose-Capillary: 103 mg/dL — ABNORMAL HIGH (ref 70–99)
Glucose-Capillary: 149 mg/dL — ABNORMAL HIGH (ref 70–99)
Glucose-Capillary: 179 mg/dL — ABNORMAL HIGH (ref 70–99)
Glucose-Capillary: 213 mg/dL — ABNORMAL HIGH (ref 70–99)
Glucose-Capillary: 220 mg/dL — ABNORMAL HIGH (ref 70–99)
Glucose-Capillary: 68 mg/dL — ABNORMAL LOW (ref 70–99)
Glucose-Capillary: 69 mg/dL — ABNORMAL LOW (ref 70–99)

## 2020-01-22 LAB — TROPONIN I (HIGH SENSITIVITY): Troponin I (High Sensitivity): 2 ng/L (ref <18)

## 2020-01-22 MED ORDER — DEXTROSE 50 % IV SOLN: 12.5000 g | INTRAVENOUS | Status: AC

## 2020-01-22 MED ORDER — DEXTROSE 50 % IV SOLN
INTRAVENOUS | Status: AC
  Administered 2020-01-22: 12.5 g via INTRAVENOUS
  Filled 2020-01-22: qty 50

## 2020-01-22 NOTE — Discharge Summary (Signed)
[facility]  Physician Discharge Summary   Patient ID: Felicia Bradley MRN: [id number] DOB/AGE: June 14, 1991 29 y.o.  Admit date: 01/18/2020 Discharge date: 01/22/2020  PCP: [facility]  DISCHARGE DIAGNOSES:  COVID-19 infection History of asthma with recent exacerbation Chronic pain syndrome Diabetes mellitus type 2 diet controlled Obstructive sleep apnea History of multiple psychiatric illnesses including generalized anxiety disorder bipolar disorder and paranoid schizophrenia per her mother Obesity   RECOMMENDATIONS FOR OUTPATIENT FOLLOW UP: 1. Patient instructed to follow-up with her primary care provider    Home Health: None Equipment/Devices: None  CODE STATUS: Full code  DISCHARGE CONDITION: fair  Diet recommendation: As before  INITIAL HISTORY: 29 y.o.femalewith medical history significant ofasthma, diabetes type 2,  morbid obesity.She states on 01/10/19 she found out her boyfriend was positive for covid. She had been taking care of him. She was already not feeling well-intermittent dizziness, weakness. On 01/12/2020 she called EMS and was taken to [facility] for shortness of breath and chest tightness. She was negative for covid and told her she had an asthma exacerbation. She was admitted and put on bipap for 2.5 days. She states she was given prednisone, fluids during hospital stay. She was discharged on 01/16/20. She states she continued to have shortness of breath at home. She continued to get weaker and had some falls.  She presented back to the hospital.  Noted to have tachypnea tachycardia increased work of breathing.  Patient was hospitalized for further management.  Of note, just saw ortho at different facility and has AVN of her right Knee. Recent CT shows osteonecrosis of the distal femur and lateral tibial condyle with similar appearance and distribution relative to 10/14/2014.     HOSPITAL COURSE:   COVID-19 infection Chest x-ray did not  show any acute process.   Due to her risk factors including asthma and obesity patient was started on remdesivir due to her symptoms.  She was also started on steroids.  Her inflammatory markers are only minimally elevated.  She was saturating normal on room air.  Patient was not very compliant with mobilization efforts by nursing staff.  This morning she was very adamant that she wanted to go home.  She did not want to wait for her fifth dose of remdesivir.  Since she was otherwise stable she was discharged.  History of asthma with recent exacerbation Wheezing less today compared to yesterday.  Continue inhalers.    She may complete the prednisone taper that was previously prescribed.  Chronic pain syndrome Patient followed by pain specialist as well.  Recently had injection for back pain.  She has been seen in ED numerous times for pain related issues.  Patient has multiple psychiatric issues as per mother.  There could be some drug-seeking behavior here.    No history of sickle cell Patient was adamant that she had sickle cell disease.  She mentioned that she was told by one of her outpatient providers.  Her mother was not aware of sickle cell trait or disease in her daughter.  No recent hospitalization for same.  She is followed by an hematologist in [facility] and there is no mention of sickle cell in their notes either.  Hemoglobinopathy panel was sent and is normal.  Unlikely patient has sickle cell.    Diabetes mellitus type 2, diet controlled HbA1c 6.2 recently.    Low glucose levels this morning due to poor oral intake.  Patient is awake alert.  No symptoms when I saw  her.  We did want to recheck her CBG however she refused.  She went home without this being checked.  She was encouraged to eat and drink some juice.    History of obstructive sleep apnea   History of substance abuse Urine drug screen was unremarkable.  History of generalized anxiety disorder/bipolar  disorder/insomnia/depression On Abilify at home which she may continue.  Due to excessive somnolence I am recommending that she stop her trazodone and her alprazolam.  History of avascular necrosis Recently seen by orthopedics in the outpatient setting.  She can follow-up with them at discharge.  Obesity Estimated body mass index is 54.56 kg/m as calculated from the following:   Height as of this encounter: 5\' 3"  (1.6 m).   Weight as of this encounter: 139.7 kg.  She refuses to have her CBG rechecked.  Patient has not been very cooperative with the nursing staff.  She is not allowing them to check her vital signs.  She also has been refusing blood draws at times.  Patient wants to go home today.  She does not want to continue with her COVID-19 treatments.  Since she otherwise is stable she will be discharged.       PERTINENT LABS:  The results of significant diagnostics from this hospitalization (including imaging, microbiology, ancillary and laboratory) are listed below for reference.    Microbiology: Recent Results (from the past 240 hour(s))  Blood Culture (routine x 2)     Status: None   Collection Time: 01/13/20  8:13 PM   Specimen: BLOOD  Result Value Ref Range Status   Specimen Description BLOOD RIGHT ARM  Final   Special Requests   Final    BOTTLES DRAWN AEROBIC AND ANAEROBIC Blood Culture results may not be optimal due to an excessive volume of blood received in culture bottles   Culture   Final    NO GROWTH 5 DAYS Performed at [facility], [address], [place], [place]    Report Status 01/18/2020 FINAL  Final  Respiratory Panel by RT PCR (Flu A&B, Covid) - Nasopharyngeal Swab     Status: None   Collection Time: 01/14/20 12:39 AM   Specimen: Nasopharyngeal Swab  Result Value Ref Range Status   SARS Coronavirus 2 by RT PCR NEGATIVE NEGATIVE Final    Comment: (NOTE) SARS-CoV-2 target nucleic acids are NOT DETECTED. The SARS-CoV-2 RNA is  generally detectable in upper respiratoy specimens during the acute phase of infection. The lowest concentration of SARS-CoV-2 viral copies this assay can detect is 131 copies/mL. A negative result does not preclude SARS-Cov-2 infection and should not be used as the sole basis for treatment or other patient management decisions. A negative result may occur with  improper specimen collection/handling, submission of specimen other than nasopharyngeal swab, presence of viral mutation(s) within the areas targeted by this assay, and inadequate number of viral copies (<131 copies/mL). A negative result must be combined with clinical observations, patient history, and epidemiological information. The expected result is Negative. Fact Sheet for Patients:  01/16/20 Fact Sheet for Healthcare Providers:  https://www.moore.com/ This test is not yet ap proved or cleared by the https://www.young.biz/ FDA and  has been authorized for detection and/or diagnosis of SARS-CoV-2 by FDA under an Emergency Use Authorization (EUA). This EUA will remain  in effect (meaning this test can be used) for the duration of the COVID-19 declaration under Section 564(b)(1) of the Act, 21 U.S.C. section 360bbb-3(b)(1), unless the authorization is terminated  or revoked sooner.    Influenza A by PCR NEGATIVE NEGATIVE Final   Influenza B by PCR NEGATIVE NEGATIVE Final    Comment: (NOTE) The Xpert Xpress SARS-CoV-2/FLU/RSV assay is intended as an aid in  the diagnosis of influenza from Nasopharyngeal swab specimens and  should not be used as a sole basis for treatment. Nasal washings and  aspirates are unacceptable for Xpert Xpress SARS-CoV-2/FLU/RSV  testing. Fact Sheet for Patients: https://www.moore.com/ Fact Sheet for Healthcare Providers: https://www.young.biz/ This test is not yet approved or cleared by the [place] FDA and    has been authorized for detection and/or diagnosis of SARS-CoV-2 by  FDA under an Emergency Use Authorization (EUA). This EUA will remain  in effect (meaning this test can be used) for the duration of the  Covid-19 declaration under Section 564(b)(1) of the Act, 21  U.S.C. section 360bbb-3(b)(1), unless the authorization is  terminated or revoked. Performed at [facility], [address], [place], [place] [address]   MRSA PCR Screening     Status: None   Collection Time: 01/14/20  6:45 AM   Specimen: Nasopharyngeal  Result Value Ref Range Status   MRSA by PCR NEGATIVE NEGATIVE Final    Comment:        The GeneXpert MRSA Assay (FDA approved for NASAL specimens only), is one component of a comprehensive MRSA colonization surveillance program. It is not intended to diagnose MRSA infection nor to guide or monitor treatment for MRSA infections. Performed at [facility], [address] [place], [place] [address]   Respiratory Panel by PCR     Status: None   Collection Time: 01/14/20  6:45 AM   Specimen: Nasopharyngeal Swab; Respiratory  Result Value Ref Range Status   Adenovirus NOT DETECTED NOT DETECTED Final   Coronavirus 229E NOT DETECTED NOT DETECTED Final    Comment: (NOTE) The Coronavirus on the Respiratory Panel, DOES NOT test for the novel  Coronavirus (2019 nCoV)    Coronavirus HKU1 NOT DETECTED NOT DETECTED Final   Coronavirus NL63 NOT DETECTED NOT DETECTED Final   Coronavirus OC43 NOT DETECTED NOT DETECTED Final   Metapneumovirus NOT DETECTED NOT DETECTED Final   Rhinovirus / Enterovirus NOT DETECTED NOT DETECTED Final   Influenza A NOT DETECTED NOT DETECTED Final   Influenza B NOT DETECTED NOT DETECTED Final   Parainfluenza Virus 1 NOT DETECTED NOT DETECTED Final   Parainfluenza Virus 2 NOT DETECTED NOT DETECTED Final   Parainfluenza Virus 3 NOT DETECTED NOT DETECTED Final   Parainfluenza Virus 4 NOT DETECTED NOT DETECTED Final   Respiratory  Syncytial Virus NOT DETECTED NOT DETECTED Final   Bordetella pertussis NOT DETECTED NOT DETECTED Final   Chlamydophila pneumoniae NOT DETECTED NOT DETECTED Final   Mycoplasma pneumoniae NOT DETECTED NOT DETECTED Final    Comment: Performed at [facility], [address], [place], [place] [address]  Blood Culture (routine x 2)     Status: None   Collection Time: 01/14/20  7:05 AM   Specimen: BLOOD RIGHT HAND  Result Value Ref Range Status   Specimen Description BLOOD RIGHT HAND  Final   Special Requests   Final    BOTTLES DRAWN AEROBIC AND ANAEROBIC Blood Culture results may not be optimal due to an inadequate volume of blood received in culture bottles   Culture   Final    NO GROWTH 5 DAYS Performed at [facility], [address], [place], [place] [address]  Report Status 01/19/2020 FINAL  Final  SARS CORONAVIRUS 2 (TAT 6-24 HRS) Nasopharyngeal Nasopharyngeal Swab     Status: None   Collection Time: 01/14/20 11:52 AM   Specimen: Nasopharyngeal Swab  Result Value Ref Range Status   SARS Coronavirus 2 NEGATIVE NEGATIVE Final    Comment: (NOTE) SARS-CoV-2 target nucleic acids are NOT DETECTED. The SARS-CoV-2 RNA is generally detectable in upper and lower respiratory specimens during the acute phase of infection. Negative results do not preclude SARS-CoV-2 infection, do not rule out co-infections with other pathogens, and should not be used as the sole basis for treatment or other patient management decisions. Negative results must be combined with clinical observations, patient history, and epidemiological information. The expected result is Negative. Fact Sheet for Patients: HairSlick.nohttps://www.fda.gov/media/138098/download Fact Sheet for Healthcare Providers: quierodirigir.comhttps://www.fda.gov/media/138095/download This test is not yet approved or cleared by the [place] FDA and  has been authorized for detection and/or diagnosis of SARS-CoV-2 by FDA under an Emergency Use  Authorization (EUA). This EUA will remain  in effect (meaning this test can be used) for the duration of the COVID-19 declaration under Section 56 4(b)(1) of the Act, 21 U.S.C. section 360bbb-3(b)(1), unless the authorization is terminated or revoked sooner. Performed at [facility], [address] [place], [place] [address]   Respiratory Panel by RT PCR (Flu A&B, Covid) - Nasopharyngeal Swab     Status: Abnormal   Collection Time: 01/18/20 10:12 AM   Specimen: Nasopharyngeal Swab  Result Value Ref Range Status   SARS Coronavirus 2 by RT PCR POSITIVE (A) NEGATIVE Final    Comment: RESULT CALLED TO, READ BACK BY AND VERIFIED WITH: HALL,C. RN @1351  ON 02.04.2021 BY COHEN,K (NOTE) SARS-CoV-2 target nucleic acids are DETECTED. SARS-CoV-2 RNA is generally detectable in upper respiratory specimens  during the acute phase of infection. Positive results are indicative of the presence of the identified virus, but do not rule out bacterial infection or co-infection with other pathogens not detected by the test. Clinical correlation with patient history and other diagnostic information is necessary to determine patient infection status. The expected result is Negative. Fact Sheet for Patients:  https://www.moore.com/https://www.fda.gov/media/142436/download Fact Sheet for Healthcare Providers: https://www.young.biz/https://www.fda.gov/media/142435/download This test is not yet approved or cleared by the [place] FDA and  has been authorized for detection and/or diagnosis of SARS-CoV-2 by FDA under an Emergency Use Authorization (EUA).  This EUA will remain in effect (meaning this test can be u sed) for the duration of  the COVID-19 declaration under Section 564(b)(1) of the Act, 21 U.S.C. section 360bbb-3(b)(1), unless the authorization is terminated or revoked sooner.    Influenza A by PCR NEGATIVE NEGATIVE Final   Influenza B by PCR NEGATIVE NEGATIVE Final    Comment: (NOTE) The Xpert Xpress SARS-CoV-2/FLU/RSV assay  is intended as an aid in  the diagnosis of influenza from Nasopharyngeal swab specimens and  should not be used as a sole basis for treatment. Nasal washings and  aspirates are unacceptable for Xpert Xpress SARS-CoV-2/FLU/RSV  testing. Fact Sheet for Patients: https://www.moore.com/https://www.fda.gov/media/142436/download Fact Sheet for Healthcare Providers: https://www.young.biz/https://www.fda.gov/media/142435/download This test is not yet approved or cleared by the [place] FDA and  has been authorized for detection and/or diagnosis of SARS-CoV-2 by  FDA under an Emergency Use Authorization (EUA). This EUA will remain  in effect (meaning this test can be used) for the duration of the  Covid-19 declaration under Section 564(b)(1) of the Act, 21  U.S.C. section 360bbb-3(b)(1), unless the authorization is  terminated  or revoked. Performed at [facility], [address], [place], [place] [address]   Blood Culture (routine x 2)     Status: None (Preliminary result)   Collection Time: 01/18/20  2:57 PM   Specimen: BLOOD  Result Value Ref Range Status   Specimen Description   Final    BLOOD LEFT ANTECUBITAL Performed at [facility], [address] [place], [place] [address]    Special Requests   Final    BOTTLES DRAWN AEROBIC AND ANAEROBIC Blood Culture results may not be optimal due to an excessive volume of blood received in culture bottles   Culture   Final    NO GROWTH 3 DAYS Performed at [facility], [address], [place], [place] [address]    Report Status PENDING  Incomplete  Blood Culture (routine x 2)     Status: None (Preliminary result)   Collection Time: 01/18/20  2:57 PM   Specimen: BLOOD  Result Value Ref Range Status   Specimen Description   Final    BLOOD RIGHT ANTECUBITAL Performed at [facility], [address], [place], [place] [address]    Special Requests   Final    BOTTLES DRAWN AEROBIC AND ANAEROBIC Blood Culture results  may not be optimal due to an inadequate volume of blood received in culture bottles Performed at [facility], [address] [place], [place] [address]    Culture   Final    NO GROWTH 3 DAYS Performed at [facility], [address], [place], [place] [address]    Report Status PENDING  Incomplete     Labs:  COVID-19 Labs  Recent Labs    01/20/20 0234  DDIMER 0.30  CRP 3.0*    Lab Results  Component Value Date   SARSCOV2NAA POSITIVE (A) 01/18/2020   SARSCOV2NAA NEGATIVE 01/14/2020   SARSCOV2NAA NEGATIVE 01/14/2020      Basic Metabolic Panel: Recent Labs  Lab 01/16/20 0832 01/18/20 1011 01/20/20 0234 01/21/20 0320 01/22/20 0410  NA 140 139 139 139 141  K 3.6 3.8 4.0 3.7 4.0  CL 105 103 103 105 105  CO2 25 28 21* 26 26  GLUCOSE 123* 95 106* 78 74  BUN 16 15 12 16 14   CREATININE 0.72 0.83 0.70 0.66 0.60  CALCIUM 9.0 8.5* 8.3* 8.6* 8.7*   Liver Function Tests: Recent Labs  Lab 01/18/20 1011 01/20/20 0234 01/21/20 0320 01/22/20 0410  AST 15 18 14* 13*  ALT 19 20 22 18   ALKPHOS 62 58 59 52  BILITOT 0.5 0.4 0.4 <0.1*  PROT 7.0 6.8 7.0 6.3*  ALBUMIN 3.4* 3.1* 3.3* 3.0*   CBC: Recent Labs  Lab 01/16/20 0832 01/18/20 1011 01/20/20 0234 01/21/20 0320 01/22/20 0410  WBC 10.4 7.7 6.6 7.9 9.7  NEUTROABS  --  4.4 3.5 3.5 5.4  HGB 11.4* 11.6* 10.9* 11.1* 10.8*  HCT 37.6 37.4 36.1 36.1 35.7*  MCV 88.5 88.0 87.8 88.5 89.3  PLT 453* 320 358 359 363   BNP: BNP (last 3 results) Recent Labs    10/25/19 0330 01/18/20 1011  BNP 19.6 20.6     CBG: Recent Labs  Lab 01/21/20 2010 01/22/20 0000 01/22/20 0454 01/22/20 0533 01/22/20 0723  GLUCAP 179* 213* 68* 103* 69*     IMAGING STUDIES DG Chest Port 1 View  Result Date: 01/18/2020 CLINICAL DATA:  Cough, congestion, weakness for 3 weeks EXAM: PORTABLE CHEST 1 VIEW COMPARISON:  01/13/2020  FINDINGS: The heart size and mediastinal contours are within normal limits. Both  lungs are clear. The visualized skeletal structures are unremarkable. IMPRESSION: No active disease. Electronically Signed   By: Sharlet Salina M.D.   On: 01/18/2020 10:25   DG Chest Port 1 View  Result Date: 01/13/2020 CLINICAL DATA:  COVID-19 exposure.  Cough and shortness of breath. EXAM: PORTABLE CHEST 1 VIEW COMPARISON:  October 25, 2019 FINDINGS: The heart size and mediastinal contours are within normal limits. Both lungs are clear. The visualized skeletal structures are unremarkable. IMPRESSION: No active disease. Electronically Signed   By: Gerome Sam III M.D   On: 01/13/2020 20:46   DG Knee Complete 4 Views Right  Result Date: 01/02/2020 CLINICAL DATA:  Right knee injury.  Fall yesterday. EXAM: RIGHT KNEE - COMPLETE 4+ VIEW COMPARISON:  None. FINDINGS: No evidence of fracture, dislocation, or joint effusion. No evidence of arthropathy or other focal bone abnormality. Soft tissues are unremarkable. IMPRESSION: Negative right knee radiographs. Electronically Signed   By: Marin Roberts M.D.   On: 01/02/2020 07:02    DISCHARGE EXAMINATION: Vitals:   01/21/20 2340 01/22/20 0348 01/22/20 0612 01/22/20 0754  BP: (!) 117/52 107/64 (!) 100/56 107/81  Pulse: 70 69 61 68  Resp: 20 (!) 8 16 16   Temp: 98.7 F (37.1 C) (!) 97.3 F (36.3 C)  98.8 F (37.1 C)  TempSrc: Oral Oral  Oral  SpO2: 91% 98% 96% 99%  Weight:      Height:       General appearance: Awake alert.  In no distress Resp: Clear to auscultation bilaterally.  Normal effort Cardio: S1-S2 is normal regular.  No S3-S4.  No rubs murmurs or bruit    DISPOSITION: Home  Discharge Instructions    Call MD for:  difficulty breathing, headache or visual disturbances   Complete by: As directed    Call MD for:  extreme fatigue   Complete by: As directed    Call MD for:  persistant dizziness or light-headedness   Complete by: As directed    Call MD for:  persistant nausea and vomiting   Complete by: As directed     Call MD for:  severe uncontrolled pain   Complete by: As directed    Call MD for:  temperature >100.4   Complete by: As directed    Discharge instructions   Complete by: As directed    Please follow-up with your primary care provider.  COVID 19 INSTRUCTIONS  - You are felt to be stable enough to no longer require inpatient monitoring, testing, and treatment, though you will need to follow the recommendations below: - Based on the CDC's non-test criteria for ending self-isolation: You may not return to work/leave the home until at least 21 days since symptom onset AND 3 days without a fever (without taking tylenol, ibuprofen, etc.) AND have improvement in respiratory symptoms. - Do not take NSAID medications (including, but not limited to, ibuprofen, advil, motrin, naproxen, aleve, goody's powder, etc.) - Follow up with your doctor in the next week via telehealth or seek medical attention right away if your symptoms get WORSE.  - Consider donating plasma after you have recovered (either 14 days after a negative test or 28 days after symptoms have completely resolved) because your antibodies to this virus may be helpful to give to others with life-threatening infections. Please go to the website www.oneblood.org if you would like to consider volunteering for plasma donation.    Directions for you  at home:  Wear a facemask You should wear a facemask that covers your nose and mouth when you are in the same room with other people and when you visit a healthcare provider. People who live with or visit you should also wear a facemask while they are in the same room with you.  Separate yourself from other people in your home As much as possible, you should stay in a different room from other people in your home. Also, you should use a separate bathroom, if available.  Avoid sharing household items You should not share dishes, drinking glasses, cups, eating utensils, towels, bedding, or other  items with other people in your home. After using these items, you should wash them thoroughly with soap and water.  Cover your coughs and sneezes Cover your mouth and nose with a tissue when you cough or sneeze, or you can cough or sneeze into your sleeve. Throw used tissues in a lined trash can, and immediately wash your hands with soap and water for at least 20 seconds or use an alcohol-based hand rub.  Wash your [employer] your hands often and thoroughly with soap and water for at least 20 seconds. You can use an alcohol-based hand sanitizer if soap and water are not available and if your hands are not visibly dirty. Avoid touching your eyes, nose, and mouth with unwashed hands.  Directions for those who live with, or provide care at home for you:  Limit the number of people who have contact with the patient If possible, have only one caregiver for the patient. Other household members should stay in another home or place of residence. If this is not possible, they should stay in another room, or be separated from the patient as much as possible. Use a separate bathroom, if available. Restrict visitors who do not have an essential need to be in the home.  Ensure good ventilation Make sure that shared spaces in the home have good air flow, such as from an air conditioner or an opened window, weather permitting.  Wash your hands often Wash your hands often and thoroughly with soap and water for at least 20 seconds. You can use an alcohol based hand sanitizer if soap and water are not available and if your hands are not visibly dirty. Avoid touching your eyes, nose, and mouth with unwashed hands. Use disposable paper towels to dry your hands. If not available, use dedicated cloth towels and replace them when they become wet.  Wear a facemask and gloves Wear a disposable facemask at all times in the room and gloves when you touch or have contact with the patient's blood, body fluids,  and/or secretions or excretions, such as sweat, saliva, sputum, nasal mucus, vomit, urine, or feces.  Ensure the mask fits over your nose and mouth tightly, and do not touch it during use. Throw out disposable facemasks and gloves after using them. Do not reuse. Wash your hands immediately after removing your facemask and gloves. If your personal clothing becomes contaminated, carefully remove clothing and launder. Wash your hands after handling contaminated clothing. Place all used disposable facemasks, gloves, and other waste in a lined container before disposing them with other household waste. Remove gloves and wash your hands immediately after handling these items.  Do not share dishes, glasses, or other household items with the patient Avoid sharing household items. You should not share dishes, drinking glasses, cups, eating utensils, towels, bedding, or other items with a patient who is  confirmed to have, or being evaluated for, COVID-19 infection. After the person uses these items, you should wash them thoroughly with soap and water.  Wash laundry thoroughly Immediately remove and wash clothes or bedding that have blood, body fluids, and/or secretions or excretions, such as sweat, saliva, sputum, nasal mucus, vomit, urine, or feces, on them. Wear gloves when handling laundry from the patient. Read and follow directions on labels of laundry or clothing items and detergent. In general, wash and dry with the warmest temperatures recommended on the label.  Clean all areas the individual has used often Clean all touchable surfaces, such as counters, tabletops, doorknobs, bathroom fixtures, toilets, phones, keyboards, tablets, and bedside tables, every day. Also, clean any surfaces that may have blood, body fluids, and/or secretions or excretions on them. Wear gloves when cleaning surfaces the patient has come in contact with. Use a diluted bleach solution (e.g., dilute bleach with 1 part bleach  and 10 parts water) or a household disinfectant with a label that says EPA-registered for coronaviruses. To make a bleach solution at home, add 1 tablespoon of bleach to 1 quart (4 cups) of water. For a larger supply, add  cup of bleach to 1 gallon (16 cups) of water. Read labels of cleaning products and follow recommendations provided on product labels. Labels contain instructions for safe and effective use of the cleaning product including precautions you should take when applying the product, such as wearing gloves or eye protection and making sure you have good ventilation during use of the product. Remove gloves and wash hands immediately after cleaning.  Monitor yourself for signs and symptoms of illness Caregivers and household members are considered close contacts, should monitor their health, and will be asked to limit movement outside of the home to the extent possible. Follow the monitoring steps for close contacts listed on the symptom monitoring form.   If you have additional questions, contact your local health department or call the epidemiologist on call at [phone] (available 24/7). This guidance is subject to change. For the most up-to-date guidance from [facility], please refer to their website: [email or web address]   You were cared for by a hospitalist during your hospital stay. If you have any questions about your discharge medications or the care you received while you were in the hospital after you are discharged, you can call the unit and asked to speak with the hospitalist on call if the hospitalist that took care of you is not available. Once you are discharged, your primary care physician will handle any further medical issues. Please note that NO REFILLS for any discharge medications will be authorized once you are discharged, as it is imperative that you return to your primary care physician (or establish a relationship  with a primary care physician if you do not have one) for your aftercare needs so that they can reassess your need for medications and monitor your lab values. If you do not have a primary care physician, you can call [phone] for a physician referral.   Increase activity slowly   Complete by: As directed         Allergies as of 01/22/2020      Reactions   Contrast Media [iodinated Diagnostic Agents] Anaphylaxis   Dilaudid [hydromorphone] Anaphylaxis   Tramadol Itching   Apple Hives   Banana Hives   Dexamethasone Nausea And Vomiting, Hives   "feels like my pubic hair is on fire"   Ketorolac Itching   Other Hives   Zucchini and  squash   Solu-medrol [methylprednisolone] Nausea And Vomiting   Toradol [ketorolac Tromethamine] Hives      Medication List    STOP taking these medications   ALPRAZolam 0.5 MG tablet Commonly known as: XANAX   traZODone 100 MG tablet Commonly known as: DESYREL     TAKE these medications   acetaminophen 325 MG tablet Commonly known as: TYLENOL Take 325 mg by mouth every 6 (six) hours as needed for moderate pain.   ARIPiprazole 5 MG tablet Commonly known as: ABILIFY Take 5 mg by mouth daily.   EPINEPHrine 0.3 mg/0.3 mL Soaj injection Commonly known as: EPI-PEN Inject 0.3 mg into the muscle as needed for anaphylaxis.   ferrous sulfate 325 (65 FE) MG EC tablet Take 325 mg by mouth daily.   gabapentin 300 MG capsule Commonly known as: NEURONTIN Take 300 mg by mouth 3 (three) times daily.   ipratropium-albuterol 0.5-2.5 (3) MG/3ML Soln Commonly known as: DUONEB Take 3 mLs by nebulization every 6 (six) hours as needed (shortness of breath).   MULTIVITAMIN ADULT PO Take 1 tablet by mouth daily.   oxyCODONE 5 MG immediate release tablet Commonly known as: Roxicodone Take 1 tablet (5 mg total) by mouth every 6 (six) hours as needed for up to 20 doses for breakthrough pain.   predniSONE 10 MG tablet Commonly known as: DELTASONE Take 4  tablets (40 mg total) by mouth daily with breakfast for 2 days, THEN 3 tablets (30 mg total) daily with breakfast for 3 days, THEN 2 tablets (20 mg total) daily with breakfast for 3 days, THEN 1 tablet (10 mg total) daily with breakfast for 3 days, THEN 0.5 tablets (5 mg total) daily with breakfast for 3 days. Start taking on: January 17, 2020   Ventolin HFA 108 (90 Base) MCG/ACT inhaler Generic drug: albuterol Inhale 1 puff into the lungs every 6 (six) hours as needed for wheezing or shortness of breath.        Follow-up Information    [facility]. Schedule an appointment as soon as possible for a visit in 1 week(s).   Specialty: Internal Medicine Contact information: [address] [place] [place] [id number] [phone]           TOTAL DISCHARGE TIME: 35 minutes  Vestal Markin Rito EhrlichKrishnan  [facility] Pager on [email or web address]  01/22/2020, 2:16 PM

## 2020-01-22 NOTE — Progress Notes (Signed)
Checked morning CBG, blood glucose 68, pt very drowsy and did not want to eat or drink,  gave pt D50 via IV, CBG recheck 103, will continue to monitor

## 2020-01-22 NOTE — Progress Notes (Signed)
This am patient sleepy, patient refusing to lay on back to obtain a proper BP,  Patient finally took her am meds.   Patient wanted to go AMA, Called MD Received discharge to home orders.   1210 patient discharged to home with family.

## 2020-01-22 NOTE — Discharge Instructions (Signed)
COVID-19 COVID-19 is a respiratory infection that is caused by a virus called severe acute respiratory syndrome coronavirus 2 (SARS-CoV-2). The disease is also known as coronavirus disease or novel coronavirus. In some people, the virus may not cause any symptoms. In others, it may cause a serious infection. The infection can get worse quickly and can lead to complications, such as:  Pneumonia, or infection of the lungs.  Acute respiratory distress syndrome or ARDS. This is a condition in which fluid build-up in the lungs prevents the lungs from filling with air and passing oxygen into the blood.  Acute respiratory failure. This is a condition in which there is not enough oxygen passing from the lungs to the body or when carbon dioxide is not passing from the lungs out of the body.  Sepsis or septic shock. This is a serious bodily reaction to an infection.  Blood clotting problems.  Secondary infections due to bacteria or fungus.  Organ failure. This is when your body's organs stop working. The virus that causes COVID-19 is contagious. This means that it can spread from person to person through droplets from coughs and sneezes (respiratory secretions). What are the causes? This illness is caused by a virus. You may catch the virus by:  Breathing in droplets from an infected person. Droplets can be spread by a person breathing, speaking, singing, coughing, or sneezing.  Touching something, like a table or a doorknob, that was exposed to the virus (contaminated) and then touching your mouth, nose, or eyes. What increases the risk? Risk for infection You are more likely to be infected with this virus if you:  Are within 6 feet (2 meters) of a person with COVID-19.  Provide care for or live with a person who is infected with COVID-19.  Spend time in crowded indoor spaces or live in shared housing. Risk for serious illness You are more likely to become seriously ill from the virus if you:   Are 50 years of age or older. The higher your age, the more you are at risk for serious illness.  Live in a nursing home or long-term care facility.  Have cancer.  Have a long-term (chronic) disease such as: ? Chronic lung disease, including chronic obstructive pulmonary disease or asthma. ? A long-term disease that lowers your body's ability to fight infection (immunocompromised). ? Heart disease, including heart failure, a condition in which the arteries that lead to the heart become narrow or blocked (coronary artery disease), a disease which makes the heart muscle thick, weak, or stiff (cardiomyopathy). ? Diabetes. ? Chronic kidney disease. ? Sickle cell disease, a condition in which red blood cells have an abnormal "sickle" shape. ? Liver disease.  Are obese. What are the signs or symptoms? Symptoms of this condition can range from mild to severe. Symptoms may appear any time from 2 to 14 days after being exposed to the virus. They include:  A fever or chills.  A cough.  Difficulty breathing.  Headaches, body aches, or muscle aches.  Runny or stuffy (congested) nose.  A sore throat.  New loss of taste or smell. Some people may also have stomach problems, such as nausea, vomiting, or diarrhea. Other people may not have any symptoms of COVID-19. How is this diagnosed? This condition may be diagnosed based on:  Your signs and symptoms, especially if: ? You live in an area with a COVID-19 outbreak. ? You recently traveled to or from an area where the virus is common. ? You   provide care for or live with a person who was diagnosed with COVID-19. ? You were exposed to a person who was diagnosed with COVID-19.  A physical exam.  Lab tests, which may include: ? Taking a sample of fluid from the back of your nose and throat (nasopharyngeal fluid), your nose, or your throat using a swab. ? A sample of mucus from your lungs (sputum). ? Blood tests.  Imaging tests, which  may include, X-rays, CT scan, or ultrasound. How is this treated? At present, there is no medicine to treat COVID-19. Medicines that treat other diseases are being used on a trial basis to see if they are effective against COVID-19. Your health care provider will talk with you about ways to treat your symptoms. For most people, the infection is mild and can be managed at home with rest, fluids, and over-the-counter medicines. Treatment for a serious infection usually takes places in a hospital intensive care unit (ICU). It may include one or more of the following treatments. These treatments are given until your symptoms improve.  Receiving fluids and medicines through an IV.  Supplemental oxygen. Extra oxygen is given through a tube in the nose, a face mask, or a hood.  Positioning you to lie on your stomach (prone position). This makes it easier for oxygen to get into the lungs.  Continuous positive airway pressure (CPAP) or bi-level positive airway pressure (BPAP) machine. This treatment uses mild air pressure to keep the airways open. A tube that is connected to a motor delivers oxygen to the body.  Ventilator. This treatment moves air into and out of the lungs by using a tube that is placed in your windpipe.  Tracheostomy. This is a procedure to create a hole in the neck so that a breathing tube can be inserted.  Extracorporeal membrane oxygenation (ECMO). This procedure gives the lungs a chance to recover by taking over the functions of the heart and lungs. It supplies oxygen to the body and removes carbon dioxide. Follow these instructions at home: Lifestyle  If you are sick, stay home except to get medical care. Your health care provider will tell you how long to stay home. Call your health care provider before you go for medical care.  Rest at home as told by your health care provider.  Do not use any products that contain nicotine or tobacco, such as cigarettes, e-cigarettes, and  chewing tobacco. If you need help quitting, ask your health care provider.  Return to your normal activities as told by your health care provider. Ask your health care provider what activities are safe for you. General instructions  Take over-the-counter and prescription medicines only as told by your health care provider.  Drink enough fluid to keep your urine pale yellow.  Keep all follow-up visits as told by your health care provider. This is important. How is this prevented?  There is no vaccine to help prevent COVID-19 infection. However, there are steps you can take to protect yourself and others from this virus. To protect yourself:   Do not travel to areas where COVID-19 is a risk. The areas where COVID-19 is reported change often. To identify high-risk areas and travel restrictions, check the CDC travel website: wwwnc.cdc.gov/travel/notices  If you live in, or must travel to, an area where COVID-19 is a risk, take precautions to avoid infection. ? Stay away from people who are sick. ? Wash your hands often with soap and water for 20 seconds. If soap and water   are not available, use an alcohol-based hand sanitizer. ? Avoid touching your mouth, face, eyes, or nose. ? Avoid going out in public, follow guidance from your state and local health authorities. ? If you must go out in public, wear a cloth face covering or face mask. Make sure your mask covers your nose and mouth. ? Avoid crowded indoor spaces. Stay at least 6 feet (2 meters) away from others. ? Disinfect objects and surfaces that are frequently touched every day. This may include:  Counters and tables.  Doorknobs and light switches.  Sinks and faucets.  Electronics, such as phones, remote controls, keyboards, computers, and tablets. To protect others: If you have symptoms of COVID-19, take steps to prevent the virus from spreading to others.  If you think you have a COVID-19 infection, contact your health care  provider right away. Tell your health care team that you think you may have a COVID-19 infection.  Stay home. Leave your house only to seek medical care. Do not use public transport.  Do not travel while you are sick.  Wash your hands often with soap and water for 20 seconds. If soap and water are not available, use alcohol-based hand sanitizer.  Stay away from other members of your household. Let healthy household members care for children and pets, if possible. If you have to care for children or pets, wash your hands often and wear a mask. If possible, stay in your own room, separate from others. Use a different bathroom.  Make sure that all people in your household wash their hands well and often.  Cough or sneeze into a tissue or your sleeve or elbow. Do not cough or sneeze into your hand or into the air.  Wear a cloth face covering or face mask. Make sure your mask covers your nose and mouth. Where to find more information  Centers for Disease Control and Prevention: www.cdc.gov/coronavirus/2019-ncov/index.html  World Health Organization: www.who.int/health-topics/coronavirus Contact a health care provider if:  You live in or have traveled to an area where COVID-19 is a risk and you have symptoms of the infection.  You have had contact with someone who has COVID-19 and you have symptoms of the infection. Get help right away if:  You have trouble breathing.  You have pain or pressure in your chest.  You have confusion.  You have bluish lips and fingernails.  You have difficulty waking from sleep.  You have symptoms that get worse. These symptoms may represent a serious problem that is an emergency. Do not wait to see if the symptoms will go away. Get medical help right away. Call your local emergency services (911 in the [place] Do not drive yourself to the hospital. Let the emergency medical personnel know if you think you have COVID-19. Summary  COVID-19 is a  respiratory infection that is caused by a virus. It is also known as coronavirus disease or novel coronavirus. It can cause serious infections, such as pneumonia, acute respiratory distress syndrome, acute respiratory failure, or sepsis.  The virus that causes COVID-19 is contagious. This means that it can spread from person to person through droplets from breathing, speaking, singing, coughing, or sneezing.  You are more likely to develop a serious illness if you are 50 years of age or older, have a weak immune system, live in a nursing home, or have chronic disease.  There is no medicine to treat COVID-19. Your health care provider will talk with you about ways to treat your symptoms.    Take steps to protect yourself and others from infection. Wash your hands often and disinfect objects and surfaces that are frequently touched every day. Stay away from people who are sick and wear a mask if you are sick. This information is not intended to replace advice given to you by your health care provider. Make sure you discuss any questions you have with your health care provider. Document Revised: 09/29/2019 Document Reviewed: 01/05/2019 Elsevier Patient Education  2020 [employer]  

## 2020-01-23 LAB — CULTURE, BLOOD (ROUTINE X 2)
Culture: NO GROWTH
Culture: NO GROWTH

## 2020-01-28 ENCOUNTER — Emergency Department (HOSPITAL_COMMUNITY)
Admission: EM | Admit: 2020-01-28 | Discharge: 2020-01-28 | Disposition: A | Discharge status: Home / Self Care | Payer: Medicaid Other | Attending: Emergency Medicine | Admitting: Emergency Medicine

## 2020-01-28 ENCOUNTER — Other Ambulatory Visit: Payer: Self-pay

## 2020-01-28 ENCOUNTER — Encounter (HOSPITAL_COMMUNITY): Payer: Self-pay

## 2020-01-28 DIAGNOSIS — H9202 Otalgia, left ear: Secondary | ICD-10-CM | POA: Diagnosis not present

## 2020-01-28 DIAGNOSIS — I129 Hypertensive chronic kidney disease with stage 1 through stage 4 chronic kidney disease, or unspecified chronic kidney disease: Secondary | ICD-10-CM | POA: Insufficient documentation

## 2020-01-28 DIAGNOSIS — N189 Chronic kidney disease, unspecified: Secondary | ICD-10-CM | POA: Insufficient documentation

## 2020-01-28 DIAGNOSIS — E1122 Type 2 diabetes mellitus with diabetic chronic kidney disease: Secondary | ICD-10-CM | POA: Insufficient documentation

## 2020-01-28 DIAGNOSIS — Z79899 Other long term (current) drug therapy: Secondary | ICD-10-CM | POA: Diagnosis not present

## 2020-01-28 MED ORDER — AMOXICILLIN-POT CLAVULANATE 875-125 MG PO TABS: 1.0000 | ORAL_TABLET | Freq: Two times a day (BID) | ORAL | 0 refills | Status: AC

## 2020-01-28 NOTE — Discharge Instructions (Addendum)
You have been seen today for left ear pain. Please read and follow all provided instructions. Return to the emergency room for worsening condition or new concerning symptoms.    1. Medications:  Prescription sent to your pharmacy for Augmentin. This is an antibiotic to treat ear infection. Please take as prescribed  Continue usual home medications Take medications as prescribed. Please review all of the medicines and only take them if you do not have an allergy to them.   2. Treatment: rest, drink plenty of fluids. Do not put anything else in your ears!  3. Follow Up:  Please follow up ENT calling the number including discharge report.  When you call please say this is an emergency department follow-up visit.  You should scheduled for next available appointment.  It is also a possibility that you have an allergic reaction to any of the medicines that you have been prescribed - Everybody reacts differently to medications and while MOST people have no trouble with most medicines, you may have a reaction such as nausea, vomiting, rash, swelling, shortness of breath. If this is the case, please stop taking the medicine immediately and contact your physician.  ?

## 2020-01-28 NOTE — ED Provider Notes (Signed)
[facility] DEPT Provider Note   CSN: [id number] Arrival date & time: 01/28/20  1355     History Chief Complaint  Patient presents with  . Otalgia  . COVID POSITIVE    Felicia Bradley is a 29 y.o. female with past medical history significant for type 2 diabetes, hypertension, PE not on anticoagulation presents to emergency department today with chief complaint of acute onset left-sided otalgia. She has history of ear infections and states this feels similar, her last infection was over 6 months ago.  She tried cleaning her ear with peroxide as well as honey.  She states it did not help the pain. She woke up this morning with popping sensation in her left ear and has continued to have dull aching pain. Pain does not radiate. She reports hearing loss in left ear that is new. She admits to cleaning her ears daily and the other day she had a lot of wax in her left ear that she reports aggressively cleaning out with a suctioning home kit. She denies any fever, chills, congestion, sinus tenderness, neck pain, chest pain, shortness of breath, rash.  Patient tested positive for Covid on 01/16/2020. She reports her symptoms have been well controlled. She denies any shortness of breath or wheezing. History provided by patient with additional history obtained from chart review.      Past Medical History:  Diagnosis Date  . Asthma   . Diabetes mellitus without complication ([place])    type 2  . Hypertension   . Obesity   . Pulmonary embolism [facility])     Patient Active Problem List   Diagnosis Date Noted  . Malingering 01/22/2020  . Acute respiratory disease due to COVID-19 virus 01/18/2020  . Asthma 01/18/2020  . Asthma exacerbation 01/14/2020  . Type 2 DM with CKD and hypertension ([place]) 01/14/2020  . OSA (obstructive sleep apnea) 01/14/2020  . Suspected COVID-19 virus infection     Past Surgical History:  Procedure Laterality Date  . CESAREAN SECTION    . NO  PAST SURGERIES       OB History    Gravida  2   Para      Term      Preterm      AB  2   Living  0     SAB  1   TAB  1   Ectopic      Multiple      Live Births              Family History  Problem Relation Age of Onset  . Diabetes Mother   . Asthma Mother   . Asthma Maternal Aunt   . Diabetes Maternal Aunt     Social History   Tobacco Use  . Smoking status: Never Smoker  . Smokeless tobacco: Never Used  Substance Use Topics  . Alcohol use: No  . Drug use: No    Home Medications Prior to Admission medications   Medication Sig Start Date End Date Taking? Authorizing Provider  acetaminophen (TYLENOL) 325 MG tablet Take 325 mg by mouth every 6 (six) hours as needed for moderate pain.    [provider]  albuterol (VENTOLIN HFA) 108 (90 Base) MCG/ACT inhaler Inhale 1 puff into the lungs every 6 (six) hours as needed for wheezing or shortness of breath.  11/10/14   [provider]  amoxicillin-clavulanate (AUGMENTIN) 875-125 MG tablet Take 1 tablet by mouth every 12 (twelve) hours for 5 days.  01/28/20 02/02/20  Zianne Schubring E, PA-C  ARIPiprazole (ABILIFY) 5 MG tablet Take 5 mg by mouth daily.    [provider]  EPINEPHrine 0.3 mg/0.3 mL IJ SOAJ injection Inject 0.3 mg into the muscle as needed for anaphylaxis.  11/10/14   [provider]  ferrous sulfate 325 (65 FE) MG EC tablet Take 325 mg by mouth daily. 11/10/14   [provider]  gabapentin (NEURONTIN) 300 MG capsule Take 300 mg by mouth 3 (three) times daily. 05/15/19   [provider]  ipratropium-albuterol (DUONEB) 0.5-2.5 (3) MG/3ML SOLN Take 3 mLs by nebulization every 6 (six) hours as needed (shortness of breath).  07/19/19   [provider]  Multiple Vitamins-Minerals (MULTIVITAMIN ADULT PO) Take 1 tablet by mouth daily.    [provider]  oxyCODONE (ROXICODONE) 5 MG immediate release tablet Take 1 tablet (5 mg total) by mouth  every 6 (six) hours as needed for up to 20 doses for breakthrough pain. 01/02/20   Curatolo, Adam, DO  predniSONE (DELTASONE) 10 MG tablet Take 4 tablets (40 mg total) by mouth daily with breakfast for 2 days, THEN 3 tablets (30 mg total) daily with breakfast for 3 days, THEN 2 tablets (20 mg total) daily with breakfast for 3 days, THEN 1 tablet (10 mg total) daily with breakfast for 3 days, THEN 0.5 tablets (5 mg total) daily with breakfast for 3 days. 01/17/20 01/31/20  Madalyn Rob, MD    Allergies    Contrast media [iodinated diagnostic agents], Dilaudid [hydromorphone], Tramadol, Apple, Banana, Dexamethasone, Ketorolac, Other, Solu-medrol [methylprednisolone], and Toradol [ketorolac tromethamine]  Review of Systems   Review of Systems  All other systems are reviewed and are negative for acute change except as noted in the HPI.   Physical Exam Updated Vital Signs BP (!) 146/119 (BP Location: Left Arm)   Pulse 96   Temp 98.3 F (36.8 C) (Oral)   Resp 16   Ht 5' 3"  (1.6 m)   Wt (!) 139.7 kg   LMP 01/02/2020   SpO2 100%   BMI 54.56 kg/m   Physical Exam Vitals and nursing note reviewed.  Constitutional:      Appearance: She is well-developed. She is not ill-appearing or toxic-appearing.  HENT:     Head: Normocephalic and atraumatic.     Right Ear: External ear normal. Decreased hearing noted. No swelling. There is no impacted cerumen. No mastoid tenderness. Tympanic membrane is erythematous and bulging. Tympanic membrane is not perforated or retracted.     Left Ear: Hearing, tympanic membrane, ear canal and external ear normal. Tympanic membrane is not erythematous or bulging.     Nose: Nose normal.  Eyes:     General: No scleral icterus.       Right eye: No discharge.        Left eye: No discharge.     Conjunctiva/sclera: Conjunctivae normal.  Neck:     Vascular: No JVD.  Cardiovascular:     Rate and Rhythm: Normal rate and regular rhythm.     Pulses: Normal pulses.      Heart sounds: Normal heart sounds.  Pulmonary:     Effort: Pulmonary effort is normal.     Breath sounds: Normal breath sounds.     Comments: Lungs are clear to auscultation all fields.  Normal work of breathing.  No wheezing, rales, rhonchi.  SPO2 is 90% on room air. No respiratory distress. Abdominal:     General: There is no distension.  Musculoskeletal:  General: Normal range of motion.     Cervical back: Normal range of motion.  Skin:    General: Skin is warm and dry.  Neurological:     Mental Status: She is oriented to person, place, and time.     GCS: GCS eye subscore is 4. GCS verbal subscore is 5. GCS motor subscore is 6.     Comments: Fluent speech, no facial droop.  Psychiatric:        Behavior: Behavior normal.       ED Results / Procedures / Treatments   Labs (all labs ordered are listed, but only abnormal results are displayed) Labs Reviewed - No data to display  EKG None  Radiology No results found.  Procedures Procedures (including critical care time)  Medications Ordered in ED Medications - No data to display  ED Course  I have reviewed the triage vital signs and the nursing notes.  Pertinent labs & imaging results that were available during my care of the patient were reviewed by me and considered in my medical decision making (see chart for details).    MDM Rules/Calculators/A&P                      Patient seen and examined. Patient presents awake, alert, hemodynamically stable, afebrile, non toxic.  Patient presents with otalgia and exam consistent with acute otitis media.  No concern for acute mastoiditis, meningitis.No signs of perforation on exam and she is still having pain.   No antibiotic use in the last month.  Patient discharged home with Augmentin.  Advised parents to call ENT tomorrow for follow-up and further evaluation of decreased hearing.  I have also discussed reasons to return immediately to the ER.  Parent expresses  understanding and agrees with plan. Advised to not clean ears further, especially not with full strength peroxide and suction home kit.   Portions of this note were generated with Lobbyist. Dictation errors may occur despite best attempts at proofreading.   Final Clinical Impression(s) / ED Diagnoses Final diagnoses:  Otalgia of left ear    Rx / DC Orders ED Discharge Orders         Ordered    amoxicillin-clavulanate (AUGMENTIN) 875-125 MG tablet  Every 12 hours     01/28/20 1509           Flint Melter 01/28/20 1621    Virgel Manifold, MD 01/29/20 1429

## 2020-01-28 NOTE — ED Triage Notes (Signed)
Pt reports left ear pain that started last night. Pt reports putting peroxide in her ear last night and going to sleep. Pt reports she now cannot hear out of ear.

## 2020-03-01 ENCOUNTER — Other Ambulatory Visit: Payer: Self-pay

## 2020-03-01 ENCOUNTER — Emergency Department (HOSPITAL_COMMUNITY)
Admission: EM | Admit: 2020-03-01 | Discharge: 2020-03-01 | Disposition: A | Discharge status: Home / Self Care | Payer: Medicaid Other | Attending: Emergency Medicine | Admitting: Emergency Medicine

## 2020-03-01 DIAGNOSIS — R197 Diarrhea, unspecified: Secondary | ICD-10-CM

## 2020-03-01 DIAGNOSIS — Z79899 Other long term (current) drug therapy: Secondary | ICD-10-CM | POA: Insufficient documentation

## 2020-03-01 DIAGNOSIS — N189 Chronic kidney disease, unspecified: Secondary | ICD-10-CM | POA: Insufficient documentation

## 2020-03-01 DIAGNOSIS — R1012 Left upper quadrant pain: Secondary | ICD-10-CM

## 2020-03-01 DIAGNOSIS — J45909 Unspecified asthma, uncomplicated: Secondary | ICD-10-CM | POA: Diagnosis not present

## 2020-03-01 DIAGNOSIS — R110 Nausea: Secondary | ICD-10-CM | POA: Diagnosis not present

## 2020-03-01 DIAGNOSIS — I129 Hypertensive chronic kidney disease with stage 1 through stage 4 chronic kidney disease, or unspecified chronic kidney disease: Secondary | ICD-10-CM | POA: Insufficient documentation

## 2020-03-01 DIAGNOSIS — E1122 Type 2 diabetes mellitus with diabetic chronic kidney disease: Secondary | ICD-10-CM | POA: Insufficient documentation

## 2020-03-01 LAB — URINALYSIS, ROUTINE W REFLEX MICROSCOPIC
Bilirubin Urine: NEGATIVE
Glucose, UA: NEGATIVE mg/dL
Ketones, ur: NEGATIVE mg/dL
Nitrite: NEGATIVE
Protein, ur: NEGATIVE mg/dL
Specific Gravity, Urine: 1.024 (ref 1.005–1.030)
pH: 6 (ref 5.0–8.0)

## 2020-03-01 LAB — I-STAT CHEM 8, ED
BUN: 10 mg/dL (ref 6–20)
Calcium, Ion: 1.19 mmol/L (ref 1.15–1.40)
Chloride: 106 mmol/L (ref 98–111)
Creatinine, Ser: 0.6 mg/dL (ref 0.44–1.00)
Glucose, Bld: 111 mg/dL — ABNORMAL HIGH (ref 70–99)
HCT: 36 % (ref 36.0–46.0)
Hemoglobin: 12.2 g/dL (ref 12.0–15.0)
Potassium: 3.8 mmol/L (ref 3.5–5.1)
Sodium: 140 mmol/L (ref 135–145)
TCO2: 26 mmol/L (ref 22–32)

## 2020-03-01 LAB — COMPREHENSIVE METABOLIC PANEL
ALT: 15 U/L (ref 0–44)
AST: 15 U/L (ref 15–41)
Albumin: 3.5 g/dL (ref 3.5–5.0)
Alkaline Phosphatase: 68 U/L (ref 38–126)
Anion gap: 9 (ref 5–15)
BUN: 11 mg/dL (ref 6–20)
CO2: 23 mmol/L (ref 22–32)
Calcium: 9.3 mg/dL (ref 8.9–10.3)
Chloride: 108 mmol/L (ref 98–111)
Creatinine, Ser: 0.57 mg/dL (ref 0.44–1.00)
GFR calc Af Amer: >60 mL/min (ref >60)
GFR calc non Af Amer: >60 mL/min (ref >60)
Glucose, Bld: 114 mg/dL — ABNORMAL HIGH (ref 70–99)
Potassium: 3.8 mmol/L (ref 3.5–5.1)
Sodium: 140 mmol/L (ref 135–145)
Total Bilirubin: 0.2 mg/dL — ABNORMAL LOW (ref 0.3–1.2)
Total Protein: 7.4 g/dL (ref 6.5–8.1)

## 2020-03-01 LAB — CBC
HCT: 37.4 % (ref 36.0–46.0)
Hemoglobin: 11.3 g/dL — ABNORMAL LOW (ref 12.0–15.0)
MCH: 27.2 pg (ref 26.0–34.0)
MCHC: 30.2 g/dL (ref 30.0–36.0)
MCV: 89.9 fL (ref 80.0–100.0)
Platelets: 393 10*3/uL (ref 150–400)
RBC: 4.16 MIL/uL (ref 3.87–5.11)
RDW: 15.6 % — ABNORMAL HIGH (ref 11.5–15.5)
WBC: 8.2 10*3/uL (ref 4.0–10.5)
nRBC: 0 % (ref 0.0–0.2)

## 2020-03-01 LAB — LIPASE, BLOOD: Lipase: 25 U/L (ref 11–51)

## 2020-03-01 LAB — I-STAT BETA HCG BLOOD, ED (MC, WL, AP ONLY): I-stat hCG, quantitative: <5 m[IU]/mL (ref <5)

## 2020-03-01 MED ORDER — MORPHINE SULFATE (PF) 4 MG/ML IV SOLN
4.0000 mg | Freq: Once | INTRAVENOUS | Status: AC
  Administered 2020-03-01: 4 mg via INTRAVENOUS
  Filled 2020-03-01: qty 1

## 2020-03-01 MED ORDER — ONDANSETRON HCL 4 MG/2ML IJ SOLN
4.0000 mg | Freq: Once | INTRAMUSCULAR | Status: AC
  Administered 2020-03-01: 4 mg via INTRAVENOUS
  Filled 2020-03-01: qty 2

## 2020-03-01 MED ORDER — LIDOCAINE VISCOUS HCL 2 % MT SOLN
15.0000 mL | Freq: Once | OROMUCOSAL | Status: AC
  Administered 2020-03-01: 15 mL via ORAL
  Filled 2020-03-01: qty 15

## 2020-03-01 MED ORDER — SODIUM CHLORIDE 0.9 % IV SOLN: Freq: Once | INTRAVENOUS | Status: AC

## 2020-03-01 MED ORDER — DICYCLOMINE HCL 10 MG PO CAPS
20.0000 mg | ORAL_CAPSULE | Freq: Once | ORAL | Status: AC
  Administered 2020-03-01: 20 mg via ORAL
  Filled 2020-03-01: qty 2

## 2020-03-01 MED ORDER — SODIUM CHLORIDE 0.9% FLUSH: 3.0000 mL | Freq: Once | INTRAVENOUS | Status: DC

## 2020-03-01 MED ORDER — ALUM & MAG HYDROXIDE-SIMETH 200-200-20 MG/5ML PO SUSP
30.0000 mL | Freq: Once | ORAL | Status: AC
  Administered 2020-03-01: 30 mL via ORAL
  Filled 2020-03-01: qty 30

## 2020-03-01 MED ORDER — ONDANSETRON 4 MG PO TBDP: 4.0000 mg | ORAL_TABLET | Freq: Once | ORAL | Status: DC | PRN

## 2020-03-01 NOTE — ED Provider Notes (Signed)
.  [facility]-EMERGENCY DEPT Provider Note   CSN: [id number] Arrival date & time: 03/01/20  0516     History Chief Complaint  Patient presents with  . Abdominal Pain  . Nausea    Felicia Bradley is a 30 y.o. female.  HPI  Patient is a 29 year old female with a history of asthma, DM 2, OSA, morbid obesity presented today with nausea, vomiting, diarrhea and left upper quadrant abdominal pain for the past week.  She states that she has been progressively worse over the last week.  Patient is emergency room with complaints of left lower abdominal pain that is achy, constant, occasionally sharp and stabbing, with no aggravating mitigating factors, she has taken some Tums but no other medications for her symptoms.  She states that they have progressively worsened over the past week until today and states that she rates her pain 10/10.  She states she has associated nausea, vomiting, diarrhea.  She denies any blood in her stool, no hematochezia or melena.  She states her stool is extraordinarily soft but not watery.  No fevers or chills.  She denies any chest pain, headaches, dizziness, lightheadedness, urinary frequency, dysuria, urgency, hematuria, vaginal discharge, vaginal itching, dyspareunia.      Past Medical History:  Diagnosis Date  . Asthma   . Diabetes mellitus without complication ([facility])    type 2  . Hypertension   . Obesity   . Pulmonary embolism [facility])     Patient Active Problem List   Diagnosis Date Noted  . Malingering 01/22/2020  . Acute respiratory disease due to COVID-19 virus 01/18/2020  . Asthma 01/18/2020  . Asthma exacerbation 01/14/2020  . Type 2 DM with CKD and hypertension ([facility]) 01/14/2020  . OSA (obstructive sleep apnea) 01/14/2020  . Suspected COVID-19 virus infection     Past Surgical History:  Procedure Laterality Date  . CESAREAN SECTION    . NO PAST SURGERIES       OB History    Gravida  2   Para      Term     Preterm      AB  2   Living  0     SAB  1   TAB  1   Ectopic      Multiple      Live Births              Family History  Problem Relation Age of Onset  . Diabetes Mother   . Asthma Mother   . Asthma Maternal Aunt   . Diabetes Maternal Aunt     Social History   Tobacco Use  . Smoking status: Never Smoker  . Smokeless tobacco: Never Used  Substance Use Topics  . Alcohol use: No  . Drug use: No    Home Medications Prior to Admission medications   Medication Sig Start Date End Date Taking? Authorizing Provider  acetaminophen (TYLENOL) 325 MG tablet Take 325 mg by mouth every 6 (six) hours as needed for moderate pain.   Yes [provider]  albuterol (VENTOLIN HFA) 108 (90 Base) MCG/ACT inhaler Inhale 1 puff into the lungs 2 (two) times daily as needed for wheezing or shortness of breath.  11/10/14  Yes [provider]  ALPRAZolam Prudy Feeler) 0.5 MG tablet Take 0.5 mg by mouth 3 (three) times daily as needed for anxiety. [facility]   Yes [provider]  ARIPiprazole (ABILIFY) 5 MG tablet Take 5 mg by mouth daily.  Yes [provider]  EPINEPHrine 0.3 mg/0.3 mL IJ SOAJ injection Inject 0.3 mg into the muscle as needed for anaphylaxis.  11/10/14  Yes [provider]  ferrous sulfate 325 (65 FE) MG EC tablet Take 325 mg by mouth daily. 11/10/14  Yes [provider]  gabapentin (NEURONTIN) 300 MG capsule Take 300 mg by mouth 3 (three) times daily. 05/15/19  Yes [provider]  ipratropium-albuterol (DUONEB) 0.5-2.5 (3) MG/3ML SOLN Take 3 mLs by nebulization 3 (three) times daily as needed (shortness of breath).  07/19/19  Yes [provider]  Multiple Vitamins-Minerals (MULTIVITAMIN ADULT PO) Take 1 tablet by mouth daily.   Yes [provider]  oxyCODONE (ROXICODONE) 5 MG immediate release tablet Take 1 tablet (5 mg total) by mouth every 6 (six) hours as needed for up to 20 doses for breakthrough  pain. 01/02/20  Yes Curatolo, Adam, DO  traZODone (DESYREL) 150 MG tablet Take 150 mg by mouth at bedtime.   Yes [provider]    Allergies    Contrast media [iodinated diagnostic agents], Dilaudid [hydromorphone], Tramadol, Apple, Banana, Dexamethasone, Ketorolac, Other, Solu-medrol [methylprednisolone], and Toradol [ketorolac tromethamine]  Review of Systems   Review of Systems  Constitutional: Positive for fatigue. Negative for chills and fever.  HENT: Negative for congestion.   Eyes: Negative for pain.  Respiratory: Negative for cough and shortness of breath.   Cardiovascular: Negative for chest pain and leg swelling.  Gastrointestinal: Positive for abdominal pain, diarrhea, nausea and vomiting.  Genitourinary: Negative for dysuria, flank pain, frequency, hematuria and urgency.  Musculoskeletal: Negative for myalgias.  Skin: Negative for rash.  Neurological: Negative for dizziness and headaches.    Physical Exam Updated Vital Signs BP (!) 130/112   Pulse 91   Temp 98.8 F (37.1 C) (Oral)   Resp 18   Ht 5\' 3"  (1.6 m)   Wt 123.8 kg   SpO2 100%   BMI 48.36 kg/m   Physical Exam Vitals and nursing note reviewed.  Constitutional:      General: She is not in acute distress. HENT:     Head: Normocephalic and atraumatic.     Nose: Nose normal.  Eyes:     General: No scleral icterus. Cardiovascular:     Rate and Rhythm: Normal rate and regular rhythm.     Pulses: Normal pulses.     Heart sounds: Normal heart sounds.  Pulmonary:     Effort: Pulmonary effort is normal. No respiratory distress.     Breath sounds: No wheezing.  Abdominal:     Palpations: Abdomen is soft.     Tenderness: There is abdominal tenderness (Mild left upper quadrant tenderness to palpation with the palpation.). There is no right CVA tenderness, left CVA tenderness, guarding or rebound. Negative signs include Murphy's sign, Rovsing's sign, McBurney's sign, psoas sign and obturator sign.      Comments: Morbidly obese abdomen.  Musculoskeletal:     Cervical back: Normal range of motion.     Right lower leg: No edema.     Left lower leg: No edema.  Skin:    General: Skin is warm and dry.     Capillary Refill: Capillary refill takes less than 2 seconds.  Neurological:     Mental Status: She is alert. Mental status is at baseline.  Psychiatric:        Mood and Affect: Mood normal.        Behavior: Behavior normal.     ED Results / Procedures /  Treatments   Labs (all labs ordered are listed, but only abnormal results are displayed) Labs Reviewed  COMPREHENSIVE METABOLIC PANEL - Abnormal; Notable for the following components:      Result Value   Glucose, Bld 114 (*)    Total Bilirubin 0.2 (*)    All other components within normal limits  CBC - Abnormal; Notable for the following components:   Hemoglobin 11.3 (*)    RDW 15.6 (*)    All other components within normal limits  URINALYSIS, ROUTINE W REFLEX MICROSCOPIC - Abnormal; Notable for the following components:   APPearance CLOUDY (*)    Hgb urine dipstick SMALL (*)    Leukocytes,Ua LARGE (*)    Bacteria, UA FEW (*)    All other components within normal limits  I-STAT CHEM 8, ED - Abnormal; Notable for the following components:   Glucose, Bld 111 (*)    All other components within normal limits  URINE CULTURE  LIPASE, BLOOD  I-STAT BETA HCG BLOOD, ED ([facility], [facility], [facility] ONLY)    EKG None  Radiology No results found.  Procedures Procedures (including critical care time)  Medications Ordered in ED Medications  0.9 %  sodium chloride infusion ( Intravenous Stopped 03/01/20 0940)  dicyclomine (BENTYL) capsule 20 mg (20 mg Oral Given 03/01/20 0759)  morphine 4 MG/ML injection 4 mg (4 mg Intravenous Given 03/01/20 0802)  alum & mag hydroxide-simeth (MAALOX/MYLANTA) 200-200-20 MG/5ML suspension 30 mL (30 mLs Oral Given 03/01/20 0800)    And  lidocaine (XYLOCAINE) 2 % viscous mouth solution 15 mL (15 mLs Oral Given  03/01/20 0800)  ondansetron (ZOFRAN) injection 4 mg (4 mg Intravenous Given 03/01/20 2706)    ED Course  I have reviewed the triage vital signs and the nursing notes.  Pertinent labs & imaging results that were available during my care of the patient were reviewed by me and considered in my medical decision making (see chart for details).  Patient with 1 week history of nausea, vomiting, diarrhea, left upper quadrant abdominal pain that is crampy, occasionally stabbing, worse with eating.  She denies any blood in her diarrhea or fevers.  She denies any recent travel or recent antibiotic use.  States that stool is very loose but not quite watery.  Physical exam is remarkable for morbid obesity.  Some left upper quadrant tenderness palpation.  No epigastric tenderness to palpation  The causes of generalized abdominal pain include but are not limited to AAA, mesenteric ischemia, appendicitis, diverticulitis, DKA, gastritis, gastroenteritis, AMI, nephrolithiasis, pancreatitis, peritonitis, adrenal insufficiency,lead poisoning, iron toxicity, intestinal ischemia, constipation, UTI,SBO/LBO, splenic rupture, biliary disease, IBD, IBS, PUD, or hepatitis. Ectopic pregnancy, ovarian torsion, PID.  I doubt that patient has an acute/emergent cause of her abdominal pain.  Doubt AAA as patient does not have a consistent history of hypertension although her blood pressure is elevated today.  Doubt mesenteric ischemia as her pain is not temporally correlated to p.o. intake, doubt appendicitis that she is afebrile with no right lower quadrant tenderness, guarding, rebound, peritoneal signs, doubt pancreatitis her blood work does not support this and she does not have a history of alcohol use.  SBO LDL unlikely as she has had frequent and recent bowel movements, no trauma DKA hepatic laceration or splenic rupture, doubt biliary disease that she has negative Murphy sign, no right upper quadrant tenderness, low suspicion  for hepatitis.  Ectopic pregnancy and ovarian torsion PID very unlikely given patient's location of pain.  Symptoms most consistent with viral gastroenteritis  or perhaps gastritis or early peptic ulcer disease.  Doubt perforation as she has not peritoneal symptoms as described above.  Some concern for biliary colic however patient has no obstructive symptoms today.  No leukocytosis no upper quadrant pain doubt cholecystitis.  Clinical Course as of Mar 01 2157  Fri Mar 01, 2020  1027 Acute abnormality.  CBC with no leukocytosis or significant anemia.  Patient is baseline anemic her hemoglobin is actually better than baseline today.  Lipase within normal limits doubt pancreatitis.  I-STAT hCG negative. Urinalysis with significant contamination.  Patient has no urinary symptoms this lab was ordered at triage.  No indication for antibiotic use at this time.   [WF]    Clinical Course User Index [WF] Gailen Shelter, [place]   MDM Rules/Calculators/A&P                       Patient with nausea vomiting diarrhea with no pertinent lab abnormalities patient has UA consistent with UTI however she has no symptoms of this, she is young and healthy no negation for empiric antibiotic treatment.  I recommend she follow-up with PCP urine culture obtained today and is pending.  We will try biotics at this time after she decision-making physician with patient.  Patient has elevated blood pressure in note however on discharge I personally rechecked patient's blood pressure which was 130/90.  She has no symptoms consistent with hypertensive emergency.  Will discharge as I have PCP follow-up for hypertension.  Possible initiation of blood pressure medications will be indicated if her pressure elevated after improved.   Final Clinical Impression(s) / ED Diagnoses Final diagnoses:  Left upper quadrant abdominal pain  Nausea  Diarrhea, unspecified type   The medical records were personally reviewed by myself. I  personally reviewed all lab results and interpreted all imaging studies and either concurred with their official read or contacted radiology for clarification.   This patient appears reasonably screened and I doubt any other medical condition requiring further workup, evaluation, or treatment in the ED at this time prior to discharge.   Patient's vitals are WNL apart from vital sign abnormalities discussed above, patient is in NAD, and able to ambulate in the ED at their baseline and able to tolerate PO.  Pain has been managed or a plan has been made for home management and has no complaints prior to discharge. Patient is comfortable with above plan and for discharge at this time. All questions were answered prior to disposition. Results from the ER workup discussed with the patient face to face and all questions answered to the best of my ability. The patient is safe for discharge with strict return precautions. Patient appears safe for discharge with appropriate follow-up. Conveyed my impression with the patient and they voiced understanding and are agreeable to plan.   An After Visit Summary was printed and given to the patient.  Portions of this note were generated with Scientist, clinical (histocompatibility and immunogenetics). Dictation errors may occur despite best attempts at proofreading.   Rx / DC Orders ED Discharge Orders    None       Gailen Shelter, [place] 03/01/20 2159    Terrilee Files, MD 03/02/20 1029

## 2020-03-01 NOTE — ED Triage Notes (Signed)
Pt to Ed with c/o of Left upper abdominal pain/N/V for past week. Pt states symptoms have gotten progressively worse over the week, to today where she cant take the pain anymore. Pt states pain is 9/10 stabbing in LUQ.

## 2020-03-01 NOTE — Discharge Instructions (Signed)
Please use over-the-counter loperamide.  Please take Zofran as needed for nausea.  Please follow the brat diet which I provided information for.

## 2020-03-01 NOTE — ED Notes (Signed)
Ginger ale and water given for fluid challenge

## 2020-03-08 ENCOUNTER — Other Ambulatory Visit: Payer: Self-pay

## 2020-04-11 ENCOUNTER — Other Ambulatory Visit: Payer: Self-pay

## 2020-04-11 ENCOUNTER — Encounter (HOSPITAL_COMMUNITY): Payer: Self-pay | Admitting: Emergency Medicine

## 2020-04-11 ENCOUNTER — Emergency Department (HOSPITAL_COMMUNITY)
Admission: EM | Admit: 2020-04-11 | Discharge: 2020-04-11 | Disposition: A | Discharge status: Home / Self Care | Payer: Medicaid Other | Attending: Emergency Medicine | Admitting: Emergency Medicine

## 2020-04-11 DIAGNOSIS — Z5321 Procedure and treatment not carried out due to patient leaving prior to being seen by health care provider: Secondary | ICD-10-CM | POA: Insufficient documentation

## 2020-04-11 DIAGNOSIS — R109 Unspecified abdominal pain: Secondary | ICD-10-CM | POA: Insufficient documentation

## 2020-04-11 LAB — COMPREHENSIVE METABOLIC PANEL
ALT: 15 U/L (ref 0–44)
AST: 15 U/L (ref 15–41)
Albumin: 3.6 g/dL (ref 3.5–5.0)
Alkaline Phosphatase: 59 U/L (ref 38–126)
Anion gap: 9 (ref 5–15)
BUN: 11 mg/dL (ref 6–20)
CO2: 26 mmol/L (ref 22–32)
Calcium: 9.1 mg/dL (ref 8.9–10.3)
Chloride: 105 mmol/L (ref 98–111)
Creatinine, Ser: 0.68 mg/dL (ref 0.44–1.00)
GFR calc Af Amer: >60 mL/min (ref >60)
GFR calc non Af Amer: >60 mL/min (ref >60)
Glucose, Bld: 101 mg/dL — ABNORMAL HIGH (ref 70–99)
Potassium: 3.8 mmol/L (ref 3.5–5.1)
Sodium: 140 mmol/L (ref 135–145)
Total Bilirubin: 0.3 mg/dL (ref 0.3–1.2)
Total Protein: 7.3 g/dL (ref 6.5–8.1)

## 2020-04-11 LAB — CBC
HCT: 39.2 % (ref 36.0–46.0)
Hemoglobin: 11.8 g/dL — ABNORMAL LOW (ref 12.0–15.0)
MCH: 27 pg (ref 26.0–34.0)
MCHC: 30.1 g/dL (ref 30.0–36.0)
MCV: 89.7 fL (ref 80.0–100.0)
Platelets: 406 10*3/uL — ABNORMAL HIGH (ref 150–400)
RBC: 4.37 MIL/uL (ref 3.87–5.11)
RDW: 15.5 % (ref 11.5–15.5)
WBC: 7.7 10*3/uL (ref 4.0–10.5)
nRBC: 0 % (ref 0.0–0.2)

## 2020-04-11 LAB — I-STAT BETA HCG BLOOD, ED (MC, WL, AP ONLY): I-stat hCG, quantitative: <5 m[IU]/mL (ref <5)

## 2020-04-11 LAB — LIPASE, BLOOD: Lipase: 19 U/L (ref 11–51)

## 2020-04-11 NOTE — ED Triage Notes (Signed)
Patient c/o left side abdominal pain with N/V x1 week. Denies diarrhea. Reports decreased appetite. States seen for same recently.

## 2020-04-11 NOTE — ED Notes (Signed)
Per registration, patient turned in labels and reports she is leaving. 

## 2020-06-11 ENCOUNTER — Other Ambulatory Visit: Payer: Self-pay

## 2020-06-11 ENCOUNTER — Ambulatory Visit
Admission: EM | Admit: 2020-06-11 | Discharge: 2020-06-11 | Disposition: A | Discharge status: Home / Self Care | Payer: Medicaid Other

## 2020-06-11 DIAGNOSIS — M7631 Iliotibial band syndrome, right leg: Secondary | ICD-10-CM

## 2020-06-11 NOTE — Discharge Instructions (Addendum)
Recommend RICE: rest, ice, compression, elevation as needed for pain.   Cold therapy (ice packs) can be used to help swelling both after injury and after prolonged use of areas of chronic pain/aches.  For pain: tylenol

## 2020-06-11 NOTE — ED Provider Notes (Signed)
[facility]    CSN: [id number] Arrival date & time: 06/11/20  1428      History   Chief Complaint Chief Complaint  Patient presents with  . Knee Pain    HPI Felicia Bradley is a 29 y.o. female with history of asthma, diabetes, obesity, hypertension, PE presenting for right knee pain x1 week.  Patient denies injury, as an event.  Has been walking more as an attempt to lose weight since she found out that she is pregnant: Approximately 7 weeks.  Patient denying hip pain, pelvic pain, vaginal pain or discharge.  No bleeding, rash, bruising.  Has not tried any thing for this.   Past Medical History:  Diagnosis Date  . Asthma   . Diabetes mellitus without complication (HCC)    type 2  . Hypertension   . Obesity   . Pulmonary embolism [facility])     Patient Active Problem List   Diagnosis Date Noted  . Malingering 01/22/2020  . Acute respiratory disease due to COVID-19 virus 01/18/2020  . Asthma 01/18/2020  . Asthma exacerbation 01/14/2020  . Type 2 DM with CKD and hypertension (HCC) 01/14/2020  . OSA (obstructive sleep apnea) 01/14/2020  . Suspected COVID-19 virus infection     Past Surgical History:  Procedure Laterality Date  . CESAREAN SECTION    . NO PAST SURGERIES      OB History    Gravida  3   Para      Term      Preterm      AB  2   Living  0     SAB  1   TAB  1   Ectopic      Multiple      Live Births               Home Medications    Prior to Admission medications   Medication Sig Start Date End Date Taking? Authorizing Provider  acetaminophen (TYLENOL) 325 MG tablet Take 325 mg by mouth every 6 (six) hours as needed for moderate pain.    [provider]  albuterol (VENTOLIN HFA) 108 (90 Base) MCG/ACT inhaler Inhale 1 puff into the lungs 2 (two) times daily as needed for wheezing or shortness of breath.  11/10/14   [provider]  EPINEPHrine 0.3 mg/0.3 mL IJ SOAJ injection Inject 0.3 mg into the muscle  as needed for anaphylaxis.  11/10/14   [provider]  ferrous sulfate 325 (65 FE) MG EC tablet Take 325 mg by mouth daily. 11/10/14   [provider]  ipratropium-albuterol (DUONEB) 0.5-2.5 (3) MG/3ML SOLN Take 3 mLs by nebulization 3 (three) times daily as needed (shortness of breath).  07/19/19   [provider]  Multiple Vitamins-Minerals (MULTIVITAMIN ADULT PO) Take 1 tablet by mouth daily.    [provider]    Family History Family History  Problem Relation Age of Onset  . Diabetes Mother   . Asthma Mother   . Asthma Maternal Aunt   . Diabetes Maternal Aunt     Social History Social History   Tobacco Use  . Smoking status: Never Smoker  . Smokeless tobacco: Never Used  Substance Use Topics  . Alcohol use: No  . Drug use: No     Allergies   Contrast media [iodinated diagnostic agents], Dilaudid [hydromorphone], Tramadol, Apple, Banana, Dexamethasone, Ketorolac, Other, Solu-medrol [methylprednisolone], and Toradol [ketorolac tromethamine]   Review of Systems As per HPI   Physical Exam  Triage Vital Signs ED Triage Vitals  Enc Vitals Group     BP      Pulse      Resp      Temp      Temp src      SpO2      Weight      Height      Head Circumference      Peak Flow      Pain Score      Pain Loc      Pain Edu?      Excl. in GC?    No data found.  Updated Vital Signs LMP 04/07/2020   Visual Acuity Right Eye Distance:   Left Eye Distance:   Bilateral Distance:    Right Eye Near:   Left Eye Near:    Bilateral Near:     Physical Exam Constitutional:      General: She is not in acute distress. HENT:     Head: Normocephalic and atraumatic.  Eyes:     General: No scleral icterus.    Pupils: Pupils are equal, round, and reactive to light.  Cardiovascular:     Rate and Rhythm: Normal rate.  Pulmonary:     Effort: Pulmonary effort is normal.  Musculoskeletal:        General: Tenderness present. No swelling.  Normal range of motion.     Right lower leg: No edema.     Left lower leg: No edema.     Comments: Right IT band, distal aspect tender.  No bony tenderness of right knee.  Skin:    Capillary Refill: Capillary refill takes less than 2 seconds.     Coloration: Skin is not jaundiced or pale.     Findings: No bruising.  Neurological:     General: No focal deficit present.     Mental Status: She is alert and oriented to person, place, and time.      UC Treatments / Results  Labs (all labs ordered are listed, but only abnormal results are displayed) Labs Reviewed - No data to display  EKG   Radiology No results found.  Procedures Procedures (including critical care time)  Medications Ordered in UC Medications - No data to display  Initial Impression / Assessment and Plan / UC Course  I have reviewed the triage vital signs and the nursing notes.  Pertinent labs & imaging results that were available during my care of the patient were reviewed by me and considered in my medical decision making (see chart for details).     Patient without trauma or inciting event: Radiography deferred.  H&P consistent with IT band syndrome.  We will treat supportively as outlined below.  Divided contact information for sports medicine for further evaluation if needed.  Return precautions discussed, patient verbalized understanding and is agreeable to plan. Final Clinical Impressions(s) / UC Diagnoses   Final diagnoses:  It band syndrome, right     Discharge Instructions     Recommend RICE: rest, ice, compression, elevation as needed for pain.   Cold therapy (ice packs) can be used to help swelling both after injury and after prolonged use of areas of chronic pain/aches.  For pain: tylenol    ED Prescriptions    None     PDMP not reviewed this encounter.   Hall-Potvin, [place], [place] 06/11/20 1518

## 2020-06-11 NOTE — ED Triage Notes (Signed)
Right knee pain x 1 week, no injury, patient is [redacted] weeks pregnant

## 2020-06-28 ENCOUNTER — Other Ambulatory Visit: Payer: Self-pay

## 2020-06-28 ENCOUNTER — Ambulatory Visit
Admission: EM | Admit: 2020-06-28 | Discharge: 2020-06-28 | Disposition: A | Discharge status: Home / Self Care | Payer: Medicaid Other | Attending: Emergency Medicine | Admitting: Emergency Medicine

## 2020-06-28 DIAGNOSIS — M25531 Pain in right wrist: Secondary | ICD-10-CM | POA: Diagnosis not present

## 2020-06-28 MED ORDER — DICLOFENAC SODIUM 1 % EX GEL: 2.0000 g | Freq: Four times a day (QID) | CUTANEOUS | 0 refills | Status: DC

## 2020-06-28 NOTE — ED Provider Notes (Signed)
[facility]    CSN: [id number] Arrival date & time: 06/28/20  1234      History   Chief Complaint Chief Complaint  Patient presents with  . Hand Pain    HPI Felicia Bradley is a 29 y.o. female with history of type 2 diabetes, hypertension, obesity, PE, asthma presenting for right hand pain and swelling.  Patient currently [redacted] weeks gestation: Doing well.  Denies trauma or injury, numbness or deformity.  States she noticed a small little lump which is painful with certain movements.  Denies rash, recent nail injury, elbow pain.  Has not tried nothing for this.  Past Medical History:  Diagnosis Date  . Asthma   . Diabetes mellitus without complication (HCC)    type 2  . Hypertension   . Obesity   . Pulmonary embolism [facility])     Patient Active Problem List   Diagnosis Date Noted  . Malingering 01/22/2020  . Acute respiratory disease due to COVID-19 virus 01/18/2020  . Asthma 01/18/2020  . Asthma exacerbation 01/14/2020  . Type 2 DM with CKD and hypertension (HCC) 01/14/2020  . OSA (obstructive sleep apnea) 01/14/2020  . Suspected COVID-19 virus infection     Past Surgical History:  Procedure Laterality Date  . CESAREAN SECTION    . NO PAST SURGERIES      OB History    Gravida  3   Para      Term      Preterm      AB  2   Living  0     SAB  1   TAB  1   Ectopic      Multiple      Live Births               Home Medications    Prior to Admission medications   Medication Sig Start Date End Date Taking? Authorizing Provider  acetaminophen (TYLENOL) 325 MG tablet Take 325 mg by mouth every 6 (six) hours as needed for moderate pain.    [provider]  albuterol (VENTOLIN HFA) 108 (90 Base) MCG/ACT inhaler Inhale 1 puff into the lungs 2 (two) times daily as needed for wheezing or shortness of breath.  11/10/14   [provider]  diclofenac Sodium (VOLTAREN) 1 % GEL Apply 2 g topically 4 (four) times daily. 06/28/20    Hall-Potvin, [place], PA-C  EPINEPHrine 0.3 mg/0.3 mL IJ SOAJ injection Inject 0.3 mg into the muscle as needed for anaphylaxis.  11/10/14   [provider]  ferrous sulfate 325 (65 FE) MG EC tablet Take 325 mg by mouth daily. 11/10/14   [provider]  ipratropium-albuterol (DUONEB) 0.5-2.5 (3) MG/3ML SOLN Take 3 mLs by nebulization 3 (three) times daily as needed (shortness of breath).  07/19/19   [provider]  Multiple Vitamins-Minerals (MULTIVITAMIN ADULT PO) Take 1 tablet by mouth daily.    [provider]    Family History Family History  Problem Relation Age of Onset  . Diabetes Mother   . Asthma Mother   . Asthma Maternal Aunt   . Diabetes Maternal Aunt     Social History Social History   Tobacco Use  . Smoking status: Never Smoker  . Smokeless tobacco: Never Used  Substance Use Topics  . Alcohol use: No  . Drug use: No     Allergies   Contrast media [iodinated diagnostic agents], Dilaudid [hydromorphone], Tramadol, Apple, Banana, Dexamethasone, Ketorolac, Other, Solu-medrol [methylprednisolone], and Toradol [  ketorolac tromethamine]   Review of Systems As per HPI   Physical Exam Triage Vital Signs ED Triage Vitals  Enc Vitals Group     BP 06/28/20 1245 (!) 142/100     Pulse Rate 06/28/20 1245 (!) 108     Resp 06/28/20 1245 18     Temp 06/28/20 1245 98.4 F (36.9 C)     Temp src --      SpO2 06/28/20 1245 93 %     Weight --      Height --      Head Circumference --      Peak Flow --      Pain Score 06/28/20 1246 5     Pain Loc --      Pain Edu? --      Excl. in GC? --    No data found.  Updated Vital Signs BP (!) 142/100   Pulse (!) 108   Temp 98.4 F (36.9 C)   Resp 18   LMP 04/07/2020   SpO2 93%   Visual Acuity Right Eye Distance:   Left Eye Distance:   Bilateral Distance:    Right Eye Near:   Left Eye Near:    Bilateral Near:     Physical Exam Constitutional:      General: She is not in  acute distress.    Appearance: She is obese. She is not ill-appearing.  HENT:     Head: Normocephalic and atraumatic.  Eyes:     General: No scleral icterus.    Pupils: Pupils are equal, round, and reactive to light.  Cardiovascular:     Rate and Rhythm: Normal rate.  Pulmonary:     Effort: Pulmonary effort is normal.  Musculoskeletal:        General: Tenderness present. No swelling or deformity. Normal range of motion.     Comments: Right wrist, dorsal aspect with small cystic vs LAD.  Negative Finkelstein's, Tinel's, Phalen's tests.  NVI  Skin:    Coloration: Skin is not jaundiced or pale.  Neurological:     Mental Status: She is alert and oriented to person, place, and time.      UC Treatments / Results  Labs (all labs ordered are listed, but only abnormal results are displayed) Labs Reviewed - No data to display  EKG   Radiology No results found.  Procedures Procedures (including critical care time)  Medications Ordered in UC Medications - No data to display  Initial Impression / Assessment and Plan / UC Course  I have reviewed the triage vital signs and the nursing notes.  Pertinent labs & imaging results that were available during my care of the patient were reviewed by me and considered in my medical decision making (see chart for details).     Pain not due to trauma and there is no bony deformity or tenderness: Radiography deferred.  Ace wrap applied in office which patient tolerated well.  Will treat supportively as outlined below have patient follow-up with orthopedics.  Stressed importance of avoiding NSAIDs given pregnancy.  Return precautions discussed, pt verbalized understanding and is agreeable to plan. Final Clinical Impressions(s) / UC Diagnoses   Final diagnoses:  Right wrist pain     Discharge Instructions     RICE: rest, ice, compression, elevation as needed for pain.   Cold therapy (ice packs) can be used to help swelling both after  injury and after prolonged use of areas of chronic pain/aches.  Pain medication:  Tylenol only!  Important to follow up with specialist(s) below for further evaluation/management if your symptoms persist or worsen.    ED Prescriptions    Medication Sig Dispense Auth. Provider   diclofenac Sodium (VOLTAREN) 1 % GEL Apply 2 g topically 4 (four) times daily. 100 g Hall-Potvin, [place], PA-C     PDMP not reviewed this encounter.   Hall-Potvin, [place], [place] 06/28/20 1412

## 2020-06-28 NOTE — Discharge Instructions (Addendum)
RICE: rest, ice, compression, elevation as needed for pain.   Cold therapy (ice packs) can be used to help swelling both after injury and after prolonged use of areas of chronic pain/aches.  Pain medication:  Tylenol only!  Important to follow up with specialist(s) below for further evaluation/management if your symptoms persist or worsen.

## 2020-06-28 NOTE — ED Triage Notes (Addendum)
R hand pain and swelling x 1 week, no injury, pt is [redacted] weeks pregnant

## 2020-07-07 ENCOUNTER — Other Ambulatory Visit: Payer: Self-pay

## 2020-07-07 ENCOUNTER — Emergency Department (HOSPITAL_COMMUNITY)
Admission: EM | Admit: 2020-07-07 | Discharge: 2020-07-08 | Disposition: A | Discharge status: Home / Self Care | Payer: Medicaid Other | Attending: Emergency Medicine | Admitting: Emergency Medicine

## 2020-07-07 ENCOUNTER — Emergency Department (HOSPITAL_COMMUNITY): Payer: Medicaid Other

## 2020-07-07 DIAGNOSIS — N189 Chronic kidney disease, unspecified: Secondary | ICD-10-CM | POA: Insufficient documentation

## 2020-07-07 DIAGNOSIS — Z20822 Contact with and (suspected) exposure to covid-19: Secondary | ICD-10-CM | POA: Diagnosis not present

## 2020-07-07 DIAGNOSIS — Z79899 Other long term (current) drug therapy: Secondary | ICD-10-CM | POA: Diagnosis not present

## 2020-07-07 DIAGNOSIS — R0602 Shortness of breath: Secondary | ICD-10-CM | POA: Diagnosis present

## 2020-07-07 DIAGNOSIS — I129 Hypertensive chronic kidney disease with stage 1 through stage 4 chronic kidney disease, or unspecified chronic kidney disease: Secondary | ICD-10-CM | POA: Insufficient documentation

## 2020-07-07 DIAGNOSIS — Z86711 Personal history of pulmonary embolism: Secondary | ICD-10-CM | POA: Diagnosis not present

## 2020-07-07 DIAGNOSIS — J45901 Unspecified asthma with (acute) exacerbation: Secondary | ICD-10-CM | POA: Diagnosis not present

## 2020-07-07 LAB — CBC WITH DIFFERENTIAL/PLATELET
Abs Immature Granulocytes: 0.04 10*3/uL (ref 0.00–0.07)
Basophils Absolute: 0.1 10*3/uL (ref 0.0–0.1)
Basophils Relative: 1 %
Eosinophils Absolute: 0.1 10*3/uL (ref 0.0–0.5)
Eosinophils Relative: 1 %
HCT: 41.8 % (ref 36.0–46.0)
Hemoglobin: 12.7 g/dL (ref 12.0–15.0)
Immature Granulocytes: 0 %
Lymphocytes Relative: 45 %
Lymphs Abs: 5.6 10*3/uL — ABNORMAL HIGH (ref 0.7–4.0)
MCH: 27 pg (ref 26.0–34.0)
MCHC: 30.4 g/dL (ref 30.0–36.0)
MCV: 88.9 fL (ref 80.0–100.0)
Monocytes Absolute: 0.6 10*3/uL (ref 0.1–1.0)
Monocytes Relative: 5 %
Neutro Abs: 5.9 10*3/uL (ref 1.7–7.7)
Neutrophils Relative %: 48 %
Platelets: 517 10*3/uL — ABNORMAL HIGH (ref 150–400)
RBC: 4.7 MIL/uL (ref 3.87–5.11)
RDW: 15.9 % — ABNORMAL HIGH (ref 11.5–15.5)
WBC: 12.3 10*3/uL — ABNORMAL HIGH (ref 4.0–10.5)
nRBC: 0 % (ref 0.0–0.2)

## 2020-07-07 LAB — I-STAT BETA HCG BLOOD, ED (MC, WL, AP ONLY): I-stat hCG, quantitative: <5 m[IU]/mL (ref <5)

## 2020-07-07 IMAGING — DX DG CHEST 1V PORT
1 series · 1 of 1 positions shown · non-contrast
Comparison: Chest radiograph dated [DATE] and CT dated
[DATE].

CLINICAL DATA: 28-year-old female with wheezing and respiratory
distress. Asthma.

EXAM:
PORTABLE CHEST 1 VIEW

[chest ap]
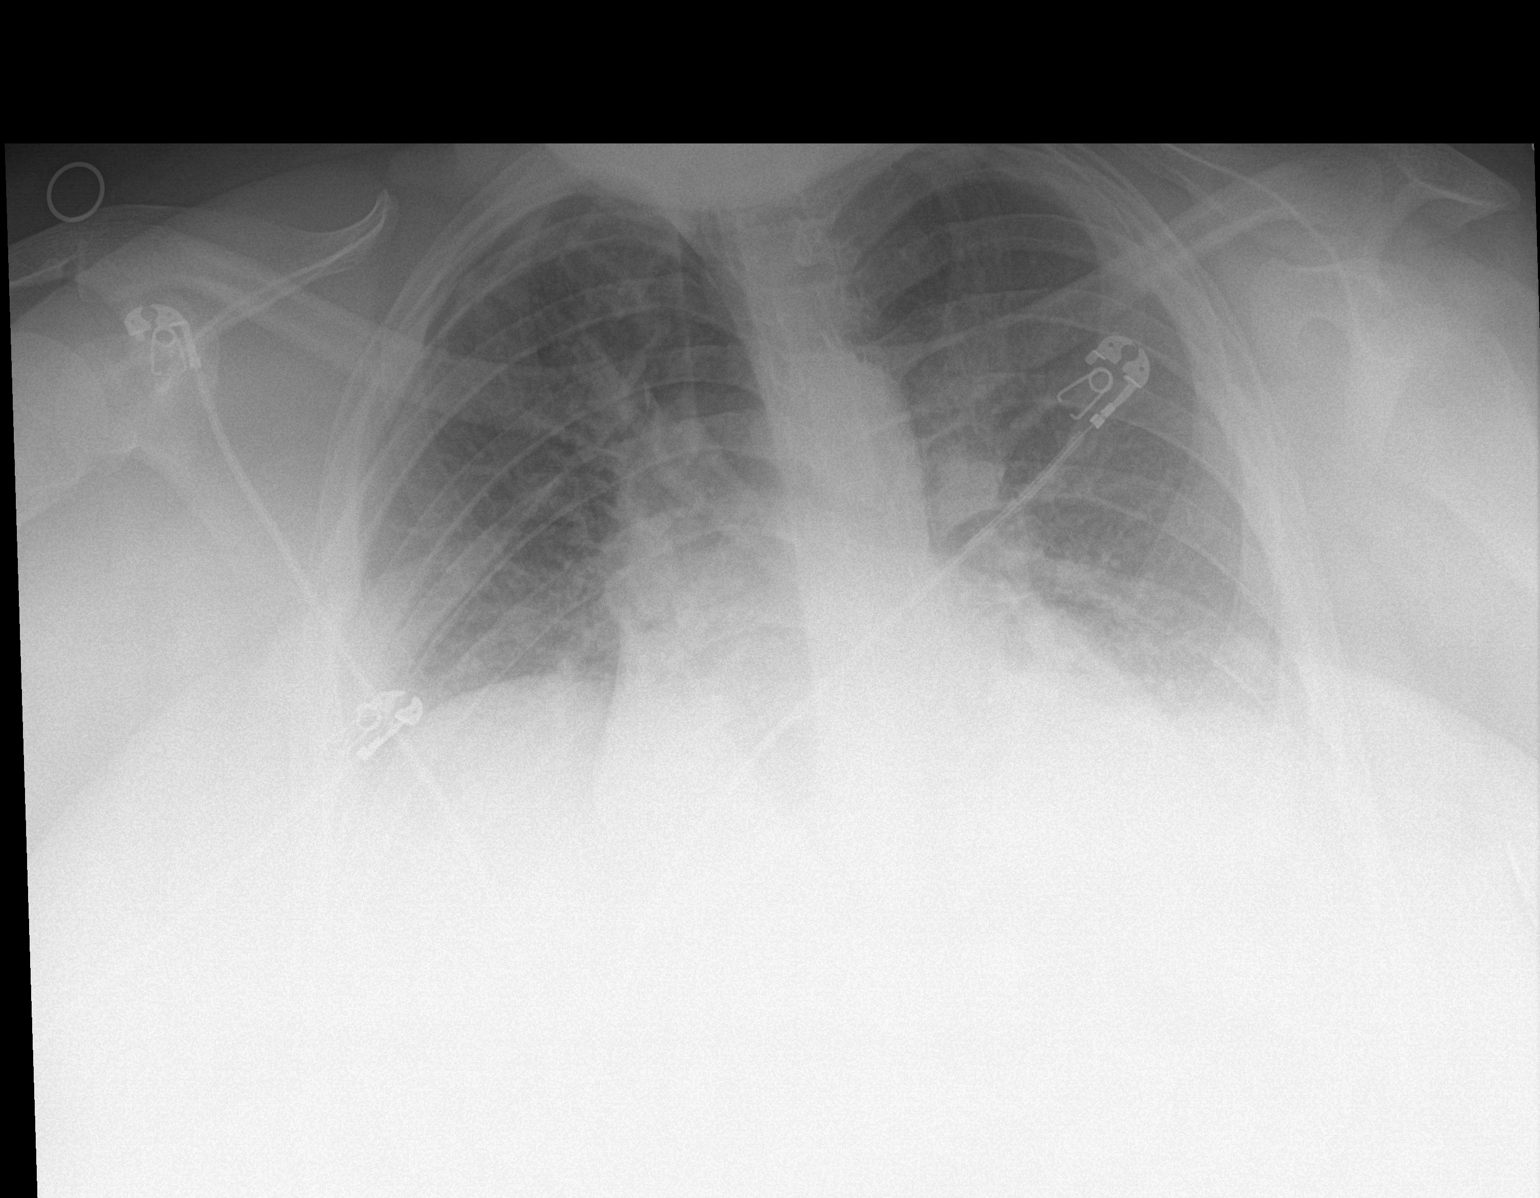

[1 of 1 positions shown; findings below may reference images not displayed]

FINDINGS: Evaluation is limited due to body habitus. There is shallow
inspiration with bibasilar atelectasis. No focal consolidation,
pleural effusion, or pneumothorax. Stable cardiac silhouette. No
acute osseous pathology.
IMPRESSION: No focal consolidation.

## 2020-07-07 MED ORDER — MAGNESIUM SULFATE 2 GM/50ML IV SOLN
2.0000 g | Freq: Once | INTRAVENOUS | Status: AC
  Administered 2020-07-07: 2 g via INTRAVENOUS
  Filled 2020-07-07: qty 50

## 2020-07-07 MED ORDER — METHYLPREDNISOLONE SODIUM SUCC 125 MG IJ SOLR
125.0000 mg | Freq: Once | INTRAMUSCULAR | Status: AC
  Administered 2020-07-07: 125 mg via INTRAVENOUS
  Filled 2020-07-07: qty 2

## 2020-07-07 MED ORDER — EPINEPHRINE 0.3 MG/0.3ML IJ SOAJ
INTRAMUSCULAR | Status: AC
  Administered 2020-07-07: 0.3 mg via INTRAMUSCULAR
  Filled 2020-07-07: qty 0.3

## 2020-07-07 MED ORDER — ALBUTEROL (5 MG/ML) CONTINUOUS INHALATION SOLN
10.0000 mg/h | INHALATION_SOLUTION | RESPIRATORY_TRACT | Status: DC
  Administered 2020-07-07: 10 mg/h via RESPIRATORY_TRACT
  Filled 2020-07-07: qty 20

## 2020-07-07 MED ORDER — ONDANSETRON HCL 4 MG/2ML IJ SOLN
4.0000 mg | Freq: Once | INTRAMUSCULAR | Status: AC
  Administered 2020-07-07: 4 mg via INTRAVENOUS
  Filled 2020-07-07: qty 2

## 2020-07-07 MED ORDER — IPRATROPIUM BROMIDE 0.02 % IN SOLN
0.5000 mg | Freq: Once | RESPIRATORY_TRACT | Status: AC
  Administered 2020-07-07: 0.5 mg via RESPIRATORY_TRACT
  Filled 2020-07-07: qty 2.5

## 2020-07-07 NOTE — ED Provider Notes (Signed)
[facility]-EMERGENCY DEPT Provider Note   CSN: [id number] Arrival date & time: 07/07/20  2313    History Chief Complaint  Patient presents with  . Respiratory Distress    Felicia Bradley is a 29 y.o. female with history of obesity, diabetes reportedly diet controlled, asthma, hypertension, PE no longer on anticoagulation, currently [redacted] weeks pregnant per patient report, presents to the ED by private vehicle for evaluation of shortness of breath.  This began 1 hour prior to arrival.  Associated with dry cough, wheezing.  Patient brought immediately to her room from triage due to noted increased work of breathing and audible wheezing.  Patient is able to answer questions and 2-3 word sentences.  Fully vaccinated for COVID more than 2 weeks ago.  Denies recent fevers, congestion, sore throat.  Denies any chest pain.  No hemoptysis.  No leg or body swelling.  No tobacco use.  Has been using her inhaler without any improvement.  Reported previous intubation due to asthma flare to triage staff.  Denies any OB complaints like lower abdominal or pelvic pain, vaginal bleeding.  HPI     Past Medical History:  Diagnosis Date  . Asthma   . Diabetes mellitus without complication (HCC)    type 2  . Hypertension   . Obesity   . Pulmonary embolism [facility])     Patient Active Problem List   Diagnosis Date Noted  . Malingering 01/22/2020  . Acute respiratory disease due to COVID-19 virus 01/18/2020  . Asthma 01/18/2020  . Asthma exacerbation 01/14/2020  . Type 2 DM with CKD and hypertension (HCC) 01/14/2020  . OSA (obstructive sleep apnea) 01/14/2020  . Suspected COVID-19 virus infection     Past Surgical History:  Procedure Laterality Date  . CESAREAN SECTION    . NO PAST SURGERIES       OB History    Gravida  3   Para      Term      Preterm      AB  2   Living  0     SAB  1   TAB  1   Ectopic      Multiple      Live Births              Family  History  Problem Relation Age of Onset  . Diabetes Mother   . Asthma Mother   . Asthma Maternal Aunt   . Diabetes Maternal Aunt     Social History   Tobacco Use  . Smoking status: Never Smoker  . Smokeless tobacco: Never Used  Substance Use Topics  . Alcohol use: No  . Drug use: No    Home Medications Prior to Admission medications   Medication Sig Start Date End Date Taking? Authorizing Provider  acetaminophen (TYLENOL) 325 MG tablet Take 325 mg by mouth every 6 (six) hours as needed for moderate pain.    [provider]  albuterol (VENTOLIN HFA) 108 (90 Base) MCG/ACT inhaler Inhale 1 puff into the lungs 2 (two) times daily as needed for wheezing or shortness of breath.  11/10/14   [provider]  diclofenac Sodium (VOLTAREN) 1 % GEL Apply 2 g topically 4 (four) times daily. 06/28/20   Hall-Potvin, [place], PA-C  EPINEPHrine 0.3 mg/0.3 mL IJ SOAJ injection Inject 0.3 mg into the muscle as needed for anaphylaxis.  11/10/14   [provider]  ferrous sulfate 325 (65 FE) MG EC tablet Take 325 mg  by mouth daily. 11/10/14   [provider]  ipratropium-albuterol (DUONEB) 0.5-2.5 (3) MG/3ML SOLN Take 3 mLs by nebulization 3 (three) times daily as needed (shortness of breath).  07/19/19   [provider]  Multiple Vitamins-Minerals (MULTIVITAMIN ADULT PO) Take 1 tablet by mouth daily.    [provider]  ondansetron (ZOFRAN ODT) 4 MG disintegrating tablet Take 1 tablet (4 mg total) by mouth every 8 (eight) hours as needed for nausea or vomiting. 07/08/20   Liberty Handy, PA-C  predniSONE (DELTASONE) 10 MG tablet Take 4 tablets (40 mg total) by mouth daily for 5 days. 07/08/20 07/13/20  Liberty Handy, PA-C    Allergies    Contrast media [iodinated diagnostic agents], Dilaudid [hydromorphone], Tramadol, Apple, Banana, Dexamethasone, Ketorolac, Other, Solu-medrol [methylprednisolone], and Toradol [ketorolac tromethamine]  Review of  Systems   Review of Systems  Respiratory: Positive for cough, chest tightness, shortness of breath and wheezing.   All other systems reviewed and are negative.   Physical Exam Updated Vital Signs BP (!) 109/91   Pulse (!) 118   Temp 99.3 F (37.4 C) (Axillary)   Resp (!) 24   Ht 5\' 3"  (1.6 m)   Wt (!) 128.4 kg   LMP 04/07/2020   SpO2 96%   BMI 50.13 kg/m   Physical Exam Vitals and nursing note reviewed.  Constitutional:      General: She is not in acute distress.    Appearance: She is well-developed.     Comments: NAD.  HENT:     Head: Normocephalic and atraumatic.     Right Ear: External ear normal.     Left Ear: External ear normal.     Nose: Nose normal.     Mouth/Throat:     Comments: No facial lip tongue or oropharyngeal edema. No stridor.  Eyes:     Conjunctiva/sclera: Conjunctivae normal.  Cardiovascular:     Rate and Rhythm: Regular rhythm. Tachycardia present.     Heart sounds: Normal heart sounds.     Comments: Obese lower extremities, no obvious pitting edema. No calf tenderness.  Pulmonary:     Effort: Respiratory distress present.     Breath sounds: Wheezing present.     Comments: Appears anxious. Audible wheezing.  Wheezing in all lung fields.  Patient with increased work of breathing, bending over over bed.  Able to follow commands to slow down breathing, this helped respiratory distress. Able to sit, answer questions in 2-3 word sentences.   Abdominal:     Palpations: Abdomen is soft.     Tenderness: There is no abdominal tenderness.  Musculoskeletal:        General: No deformity. Normal range of motion.     Cervical back: Normal range of motion and neck supple.  Skin:    General: Skin is warm and dry.     Capillary Refill: Capillary refill takes less than 2 seconds.  Neurological:     Mental Status: She is alert and oriented to person, place, and time.  Psychiatric:        Behavior: Behavior normal.        Thought Content: Thought content  normal.        Judgment: Judgment normal.     ED Results / Procedures / Treatments   Labs (all labs ordered are listed, but only abnormal results are displayed) Labs Reviewed  BASIC METABOLIC PANEL - Abnormal; Notable for the following components:      Result Value   Glucose,  Bld 113 (*)    Calcium 10.5 (*)    All other components within normal limits  CBC WITH DIFFERENTIAL/PLATELET - Abnormal; Notable for the following components:   WBC 12.3 (*)    RDW 15.9 (*)    Platelets 517 (*)    Lymphs Abs 5.6 (*)    All other components within normal limits  SARS CORONAVIRUS 2 BY RT PCR (HOSPITAL ORDER, PERFORMED IN [facility])  I-STAT BETA HCG BLOOD, ED ([facility], [facility], [facility] ONLY)    EKG EKG Interpretation  Date/Time:  Sunday July 07 2020 23:24:29 EDT Ventricular Rate:  127 PR Interval:    QRS Duration: 81 QT Interval:  295 QTC Calculation: 429 R Axis:   108 Text Interpretation: Sinus tachycardia Borderline right axis deviation Borderline Q waves in lateral leads Confirmed by Raeford Razor [phone]) on 07/07/2020 11:45:10 PM   Radiology DG Chest Port 1 View  Result Date: 07/07/2020 CLINICAL DATA:  29 year old female with wheezing and respiratory distress. Asthma. EXAM: PORTABLE CHEST 1 VIEW COMPARISON:  Chest radiograph dated 01/18/2020 and CT dated 08/08/2019. FINDINGS: Evaluation is limited due to body habitus. There is shallow inspiration with bibasilar atelectasis. No focal consolidation, pleural effusion, or pneumothorax. Stable cardiac silhouette. No acute osseous pathology. IMPRESSION: No focal consolidation. Electronically Signed   By: Elgie Collard M.D.   On: 07/07/2020 23:58    Procedures Procedures (including critical care time)  Medications Ordered in ED Medications  albuterol (PROVENTIL,VENTOLIN) solution continuous neb (10 mg/hr Nebulization New Bag/Given 07/07/20 2327)  EPINEPHrine (EPI-PEN) 0.3 mg/0.3 mL injection (0.3 mg Intramuscular Given 07/07/20 2317)    ipratropium (ATROVENT) nebulizer solution 0.5 mg (0.5 mg Nebulization Given 07/07/20 2348)  methylPREDNISolone sodium succinate (SOLU-MEDROL) 125 mg/2 mL injection 125 mg (125 mg Intravenous Given 07/07/20 2353)  magnesium sulfate IVPB 2 g 50 mL (0 g Intravenous Stopped 07/08/20 0107)  ondansetron (ZOFRAN) injection 4 mg (4 mg Intravenous Given 07/07/20 2352)    ED Course  I have reviewed the triage vital signs and the nursing notes.  Pertinent labs & imaging results that were available during my care of the patient were reviewed by me and considered in my medical decision making (see chart for details).  Clinical Course as of Jul 08 217  Wynelle Link Jul 07, 2020  2356 Patient reevaluated.  She is playing a game on her iPhone.  Reports feeling better.  Is tachycardic in setting of continuous breathing treatment, epinephrine IM.  Lung exam significantly improved, minimal expiratory wheezing in lower lobes bilaterally.  No crackles.  Significant improvement in work of breathing.   [CG]  Mon Jul 08, 2020  0134 Sinus tachycardia Borderline right axis deviation Borderline Q waves in lateral leads Confirmed by Raeford Razor [id number]) on 07/07/2020 11:45:10 PM  ED EKG [CG]  0150 Patient re-evaluated. On room air Spo2 normal. She feels better. Mild wheezing on exam. Normal WOB.  Will ambulate with pulse ox and discharge    [CG]    Clinical Course User Index [CG] Liberty Handy, PA-C   MDM Rules/Calculators/A&P                           Nursing notes and triage notes reviewed to obtain more history and assist with MDM  29 year old female with history of asthma, obesity presents for 1 hour of shortness of breath, wheezing, dry cough.  I was asked to evaluate patient by triage RN due to notable increased work of breathing.  Arrives tachycardic, tachypneic with audible wheezing.  Mild/moderate respiratory distress but still able to speak in 2-3 word sentences.  Hemodynamically stable.  Afebrile.  No  signs of hypervolemia.  She has no chest pain.  Given work of breathing, ordered epinephrine IM.  RT called to bedside starting continues neb treatment.  Patient states she is fully vaccinated for COVID-19.  Ordered screening labs, EKG, chest x-ray, Covid test.  Ordered magnesium, Solu-Medrol, Atrovent.  Will reassess.  ER work-up personally visualized and interpreted, overall benign.  Mild leukocytosis.  Afebrile.  She is persistently tachycardic but this has significantly improved and in setting of epinephrine, continues neb treatments.  Chest x-ray without acute cardiopulmonary disease, infiltrates, edema.  EKG shows sinus tachycardia and prolonged QTC.  Patient's EMR reviewed. Had COVID in February 2021.  She reports previous need for intubation for asthma but unable to find this on chart.    Patient reevaluated several times and has had significant improvement in wheezing, work of breathing.  Reports feeling much better.  Tachycardia improving.  Tachypnea improving.  Ambulated with pulse ox greater than 92%.  Given benign work-up, improvement after medicines, appropriate for discharge.  Will discharge with prednisone, albuterol for asthma exacerbation.  Given clinical presentation, improvement with meds here I doubt ACS, PE.  She does have history of blood clot but finished anticoagulation and has no chest pain.  Of note, patient thought she was [redacted] weeks pregnant but hCG here is negative.  This was discussed with her.  Return precautions discussed.  She is comfortable with this plan. Final Clinical Impression(s) / ED Diagnoses Final diagnoses:  Moderate asthma with exacerbation, unspecified whether persistent    Rx / DC Orders ED Discharge Orders         Ordered    predniSONE (DELTASONE) 10 MG tablet  Daily     Discontinue  Reprint     07/08/20 0207    ondansetron (ZOFRAN ODT) 4 MG disintegrating tablet  Every 8 hours PRN     Discontinue  Reprint     07/08/20 0207             Liberty HandyGibbons, Pia Jedlicka J, PA-C 07/08/20 [id number]    Raeford RazorKohut, Stephen, MD 07/08/20 1626

## 2020-07-07 NOTE — ED Triage Notes (Signed)
Pt c/o sudden onset of SOB around 10pm, upon arrival to ED pt is in respiratory distress, pt tachypneic and gasping for breaths, reports n/v, hx asthma

## 2020-07-08 LAB — BASIC METABOLIC PANEL
Anion gap: 14 (ref 5–15)
BUN: 14 mg/dL (ref 6–20)
CO2: 25 mmol/L (ref 22–32)
Calcium: 10.5 mg/dL — ABNORMAL HIGH (ref 8.9–10.3)
Chloride: 103 mmol/L (ref 98–111)
Creatinine, Ser: 0.74 mg/dL (ref 0.44–1.00)
GFR calc Af Amer: >60 mL/min (ref >60)
GFR calc non Af Amer: >60 mL/min (ref >60)
Glucose, Bld: 113 mg/dL — ABNORMAL HIGH (ref 70–99)
Potassium: 4 mmol/L (ref 3.5–5.1)
Sodium: 142 mmol/L (ref 135–145)

## 2020-07-08 LAB — SARS CORONAVIRUS 2 BY RT PCR (HOSPITAL ORDER, PERFORMED IN ~~LOC~~ HOSPITAL LAB): SARS Coronavirus 2: NEGATIVE

## 2020-07-08 MED ORDER — ONDANSETRON 4 MG PO TBDP
4.0000 mg | ORAL_TABLET | Freq: Three times a day (TID) | ORAL | 0 refills | Status: DC | PRN
Start: 2020-07-08 — End: 2021-03-19

## 2020-07-08 MED ORDER — PREDNISONE 10 MG PO TABS
40.0000 mg | ORAL_TABLET | Freq: Every day | ORAL | 0 refills | Status: AC
Start: 2020-07-08 — End: 2020-07-13

## 2020-07-08 NOTE — Discharge Instructions (Signed)
You were seen in the ER for shortness of breath  You were treated with asthma medicines and you improved  Please take prednisone as prescribed, zofran for nausea as needed  Albuterol 2 puffs every 4 hours  Return for worsening or return of symptoms  Pregnancy test is negative today, follow up with primary care doctor as needed

## 2020-07-08 NOTE — ED Notes (Signed)
Pt ambulated in hallway with RN, O2 sats on RA 93-98%, provider notified

## 2020-07-23 ENCOUNTER — Encounter (HOSPITAL_COMMUNITY): Payer: Self-pay | Admitting: Emergency Medicine

## 2020-07-23 ENCOUNTER — Emergency Department (HOSPITAL_COMMUNITY): Payer: Medicaid Other

## 2020-07-23 ENCOUNTER — Emergency Department (HOSPITAL_COMMUNITY)
Admission: EM | Admit: 2020-07-23 | Discharge: 2020-07-23 | Disposition: A | Discharge status: Home / Self Care | Payer: Medicaid Other | Attending: Emergency Medicine | Admitting: Emergency Medicine

## 2020-07-23 ENCOUNTER — Other Ambulatory Visit: Payer: Self-pay

## 2020-07-23 DIAGNOSIS — R509 Fever, unspecified: Secondary | ICD-10-CM | POA: Diagnosis not present

## 2020-07-23 DIAGNOSIS — R05 Cough: Secondary | ICD-10-CM | POA: Diagnosis not present

## 2020-07-23 DIAGNOSIS — Z5321 Procedure and treatment not carried out due to patient leaving prior to being seen by health care provider: Secondary | ICD-10-CM | POA: Insufficient documentation

## 2020-07-23 IMAGING — CR DG CHEST 2V
2 series · 2 of 2 positions shown · non-contrast
Comparison: [DATE]

CLINICAL DATA: Cough and vomiting

EXAM:
CHEST - 2 VIEW

[w chest pa]
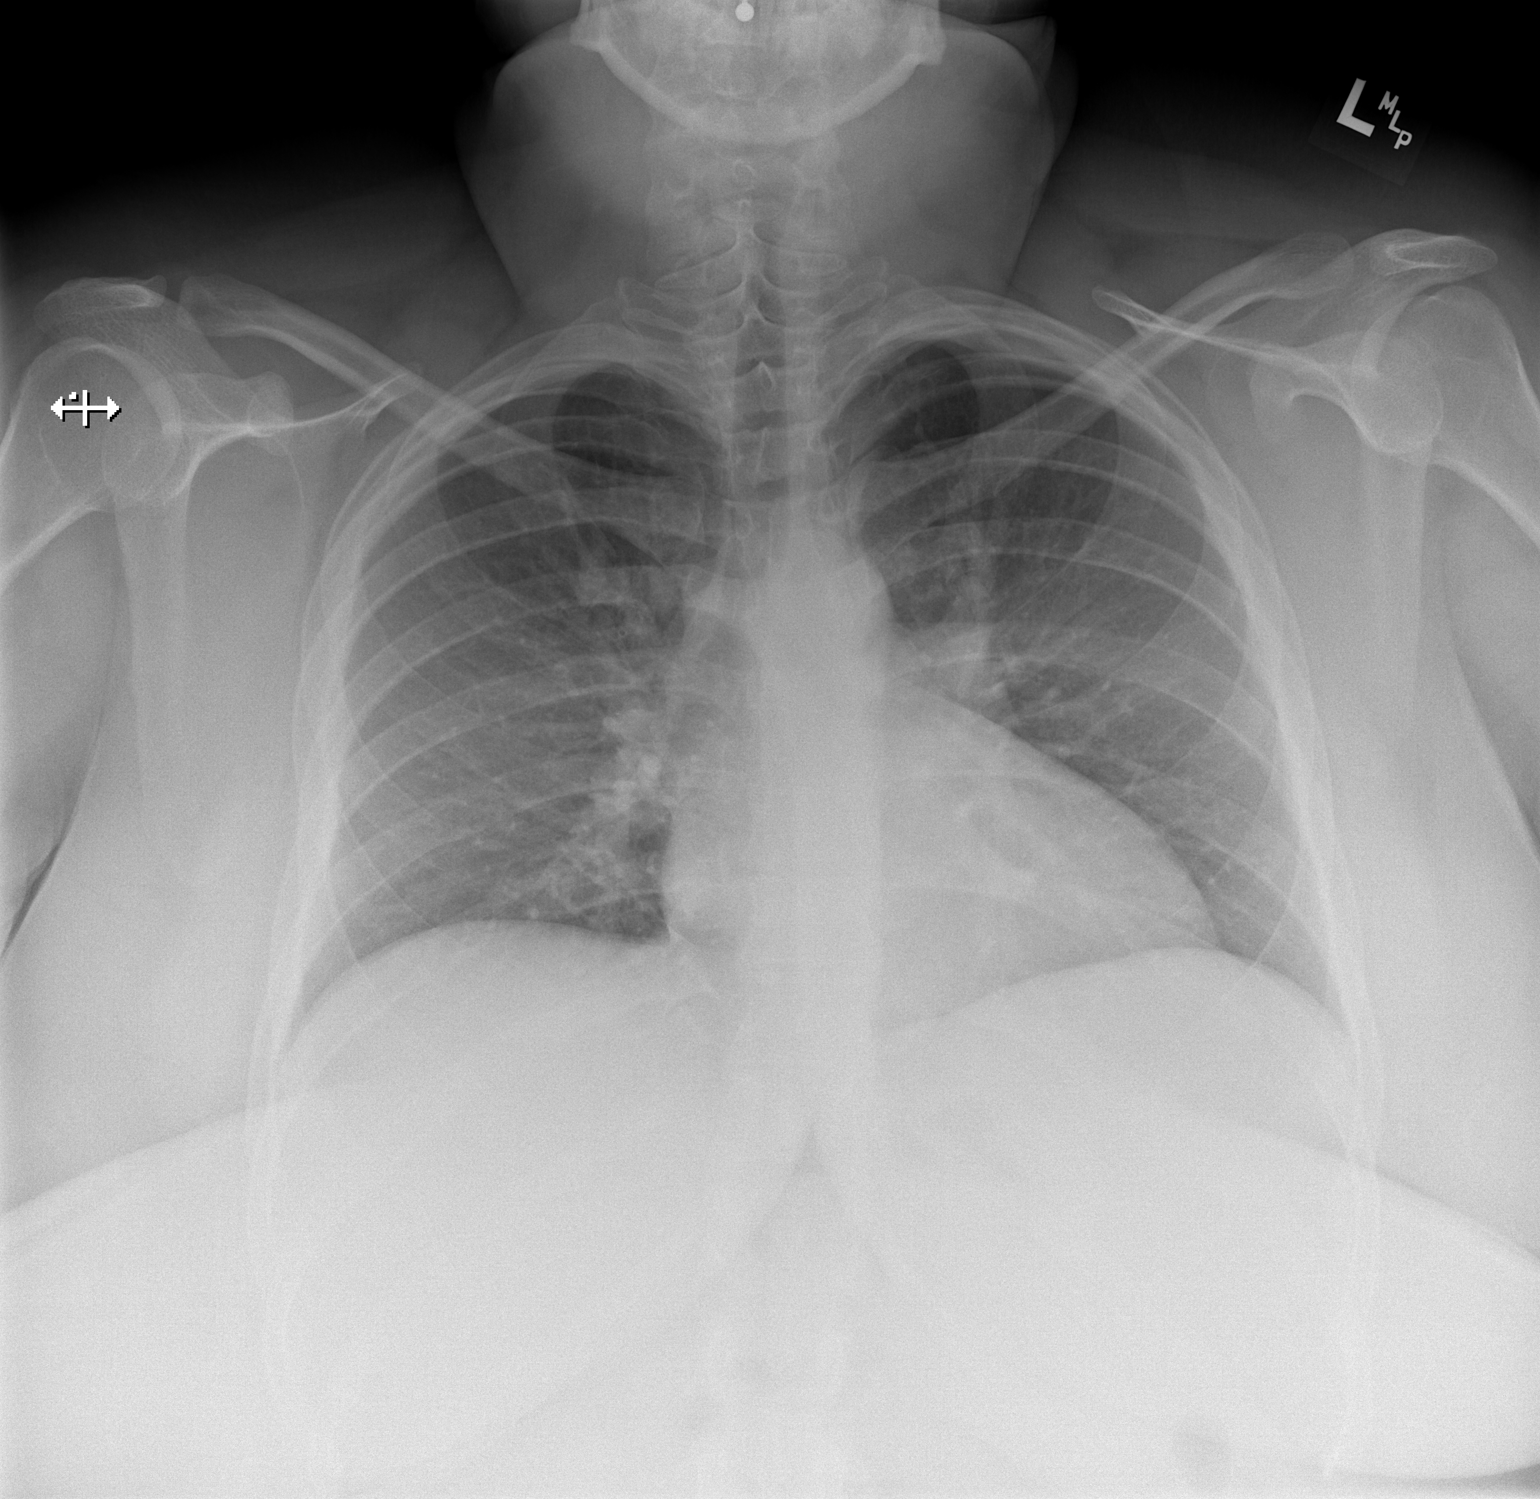

[w chest lat]
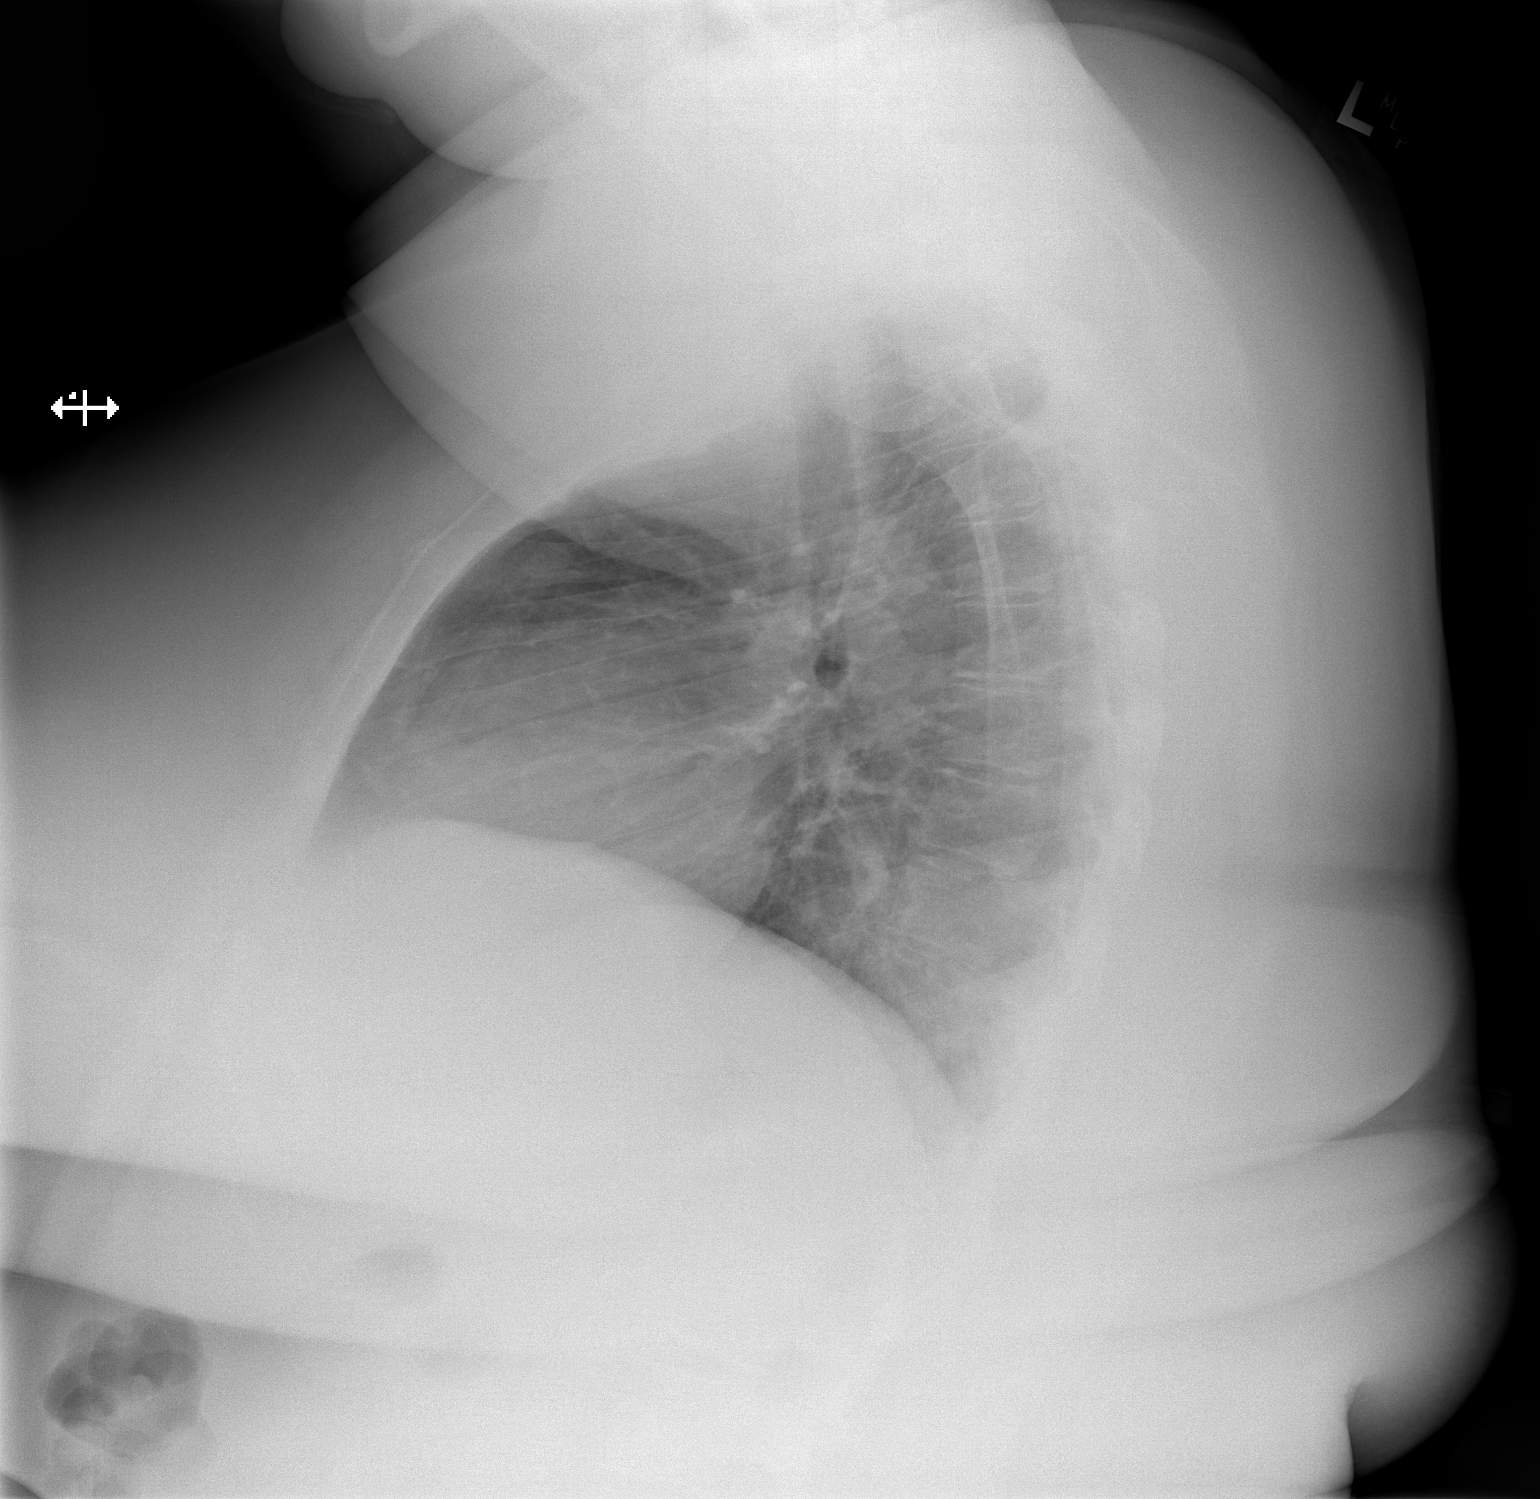

[2 of 2 positions shown; findings below may reference images not displayed]

FINDINGS: The heart size and mediastinal contours are within normal limits.
Both lungs are clear. The visualized skeletal structures are
unremarkable.
IMPRESSION: No active cardiopulmonary disease.

## 2020-07-23 NOTE — ED Triage Notes (Signed)
Sick for 3 weeks. Severe cough w/ vomiting. Intermittent fever, relieved w/ acetaminophen.

## 2020-10-26 ENCOUNTER — Encounter (HOSPITAL_COMMUNITY): Payer: Self-pay

## 2020-10-26 ENCOUNTER — Emergency Department (HOSPITAL_COMMUNITY)
Admission: EM | Admit: 2020-10-26 | Discharge: 2020-10-27 | Disposition: A | Discharge status: Home / Self Care | Payer: Medicaid Other | Attending: Emergency Medicine | Admitting: Emergency Medicine

## 2020-10-26 ENCOUNTER — Emergency Department (HOSPITAL_COMMUNITY): Payer: Medicaid Other

## 2020-10-26 ENCOUNTER — Other Ambulatory Visit: Payer: Self-pay

## 2020-10-26 DIAGNOSIS — Y9389 Activity, other specified: Secondary | ICD-10-CM | POA: Insufficient documentation

## 2020-10-26 DIAGNOSIS — Y92410 Unspecified street and highway as the place of occurrence of the external cause: Secondary | ICD-10-CM | POA: Insufficient documentation

## 2020-10-26 DIAGNOSIS — E1122 Type 2 diabetes mellitus with diabetic chronic kidney disease: Secondary | ICD-10-CM | POA: Diagnosis not present

## 2020-10-26 DIAGNOSIS — N189 Chronic kidney disease, unspecified: Secondary | ICD-10-CM | POA: Diagnosis not present

## 2020-10-26 DIAGNOSIS — Z8616 Personal history of COVID-19: Secondary | ICD-10-CM | POA: Diagnosis not present

## 2020-10-26 DIAGNOSIS — J45909 Unspecified asthma, uncomplicated: Secondary | ICD-10-CM | POA: Diagnosis not present

## 2020-10-26 DIAGNOSIS — I129 Hypertensive chronic kidney disease with stage 1 through stage 4 chronic kidney disease, or unspecified chronic kidney disease: Secondary | ICD-10-CM | POA: Insufficient documentation

## 2020-10-26 DIAGNOSIS — R519 Headache, unspecified: Secondary | ICD-10-CM | POA: Insufficient documentation

## 2020-10-26 LAB — POC URINE PREG, ED: Preg Test, Ur: NEGATIVE
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
## 2020-10-26 MED ORDER — METHOCARBAMOL 500 MG PO TABS
500.0000 mg | ORAL_TABLET | Freq: Two times a day (BID) | ORAL | 0 refills | Status: DC
Start: 2020-10-26 — End: 2021-03-19
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
## 2021-03-06 ENCOUNTER — Other Ambulatory Visit: Payer: Self-pay

## 2021-03-06 ENCOUNTER — Encounter (HOSPITAL_BASED_OUTPATIENT_CLINIC_OR_DEPARTMENT_OTHER): Payer: Self-pay | Admitting: Emergency Medicine

## 2021-03-06 ENCOUNTER — Emergency Department (HOSPITAL_BASED_OUTPATIENT_CLINIC_OR_DEPARTMENT_OTHER)
Admission: EM | Admit: 2021-03-06 | Discharge: 2021-03-07 | Disposition: A | Discharge status: Home / Self Care | Payer: Medicaid Other | Attending: Emergency Medicine | Admitting: Emergency Medicine

## 2021-03-06 DIAGNOSIS — J209 Acute bronchitis, unspecified: Secondary | ICD-10-CM | POA: Insufficient documentation

## 2021-03-06 DIAGNOSIS — J45909 Unspecified asthma, uncomplicated: Secondary | ICD-10-CM | POA: Insufficient documentation

## 2021-03-06 DIAGNOSIS — R0789 Other chest pain: Secondary | ICD-10-CM | POA: Diagnosis present

## 2021-03-06 DIAGNOSIS — E119 Type 2 diabetes mellitus without complications: Secondary | ICD-10-CM | POA: Insufficient documentation

## 2021-03-06 DIAGNOSIS — I10 Essential (primary) hypertension: Secondary | ICD-10-CM | POA: Diagnosis not present

## 2021-03-06 DIAGNOSIS — R1032 Left lower quadrant pain: Secondary | ICD-10-CM | POA: Insufficient documentation

## 2021-03-06 NOTE — ED Triage Notes (Signed)
Pt c/o chest pain that started approx a few hrs ago. Pt is also c/o abd "lumps" that have been bothering her. Pt states that the pain is on the left side and stays there. Pt denies any similar symptoms in the past. Pt aaox3, ambulatory with steady gait into triage, GCS 15.

## 2021-03-07 ENCOUNTER — Emergency Department (HOSPITAL_BASED_OUTPATIENT_CLINIC_OR_DEPARTMENT_OTHER): Payer: Medicaid Other

## 2021-03-07 LAB — CBC WITH DIFFERENTIAL/PLATELET
Abs Immature Granulocytes: 0.02 10*3/uL (ref 0.00–0.07)
Basophils Absolute: 0.1 10*3/uL (ref 0.0–0.1)
Basophils Relative: 1 %
Eosinophils Absolute: 0.1 10*3/uL (ref 0.0–0.5)
Eosinophils Relative: 1 %
HCT: 36.3 % (ref 36.0–46.0)
Hemoglobin: 11.1 g/dL — ABNORMAL LOW (ref 12.0–15.0)
Immature Granulocytes: 0 %
Lymphocytes Relative: 41 %
Lymphs Abs: 3.3 10*3/uL (ref 0.7–4.0)
MCH: 27.3 pg (ref 26.0–34.0)
MCHC: 30.6 g/dL (ref 30.0–36.0)
MCV: 89.2 fL (ref 80.0–100.0)
Monocytes Absolute: 0.4 10*3/uL (ref 0.1–1.0)
Monocytes Relative: 5 %
Neutro Abs: 4.2 10*3/uL (ref 1.7–7.7)
Neutrophils Relative %: 52 %
Platelets: 453 10*3/uL — ABNORMAL HIGH (ref 150–400)
RBC: 4.07 MIL/uL (ref 3.87–5.11)
RDW: 16 % — ABNORMAL HIGH (ref 11.5–15.5)
WBC: 8 10*3/uL (ref 4.0–10.5)
nRBC: 0 % (ref 0.0–0.2)

## 2021-03-07 LAB — BASIC METABOLIC PANEL WITH GFR
Anion gap: 9 (ref 5–15)
BUN: 8 mg/dL (ref 6–20)
CO2: 25 mmol/L (ref 22–32)
Calcium: 8.9 mg/dL (ref 8.9–10.3)
Chloride: 104 mmol/L (ref 98–111)
Creatinine, Ser: 0.9 mg/dL (ref 0.44–1.00)
GFR, Estimated: >60 mL/min
Glucose, Bld: 132 mg/dL — ABNORMAL HIGH (ref 70–99)
Potassium: 3.4 mmol/L — ABNORMAL LOW (ref 3.5–5.1)
Sodium: 138 mmol/L (ref 135–145)

## 2021-03-07 LAB — D-DIMER, QUANTITATIVE: D-Dimer, Quant: 0.31 ug/mL-FEU (ref 0.00–0.50)

## 2021-03-07 IMAGING — DX DG CHEST 2V
2 series · 2 of 2 positions shown · non-contrast
Comparison: Radiograph [DATE]

CLINICAL DATA: Shortness of breath, chest pain

EXAM:
CHEST - 2 VIEW

[chest lat]
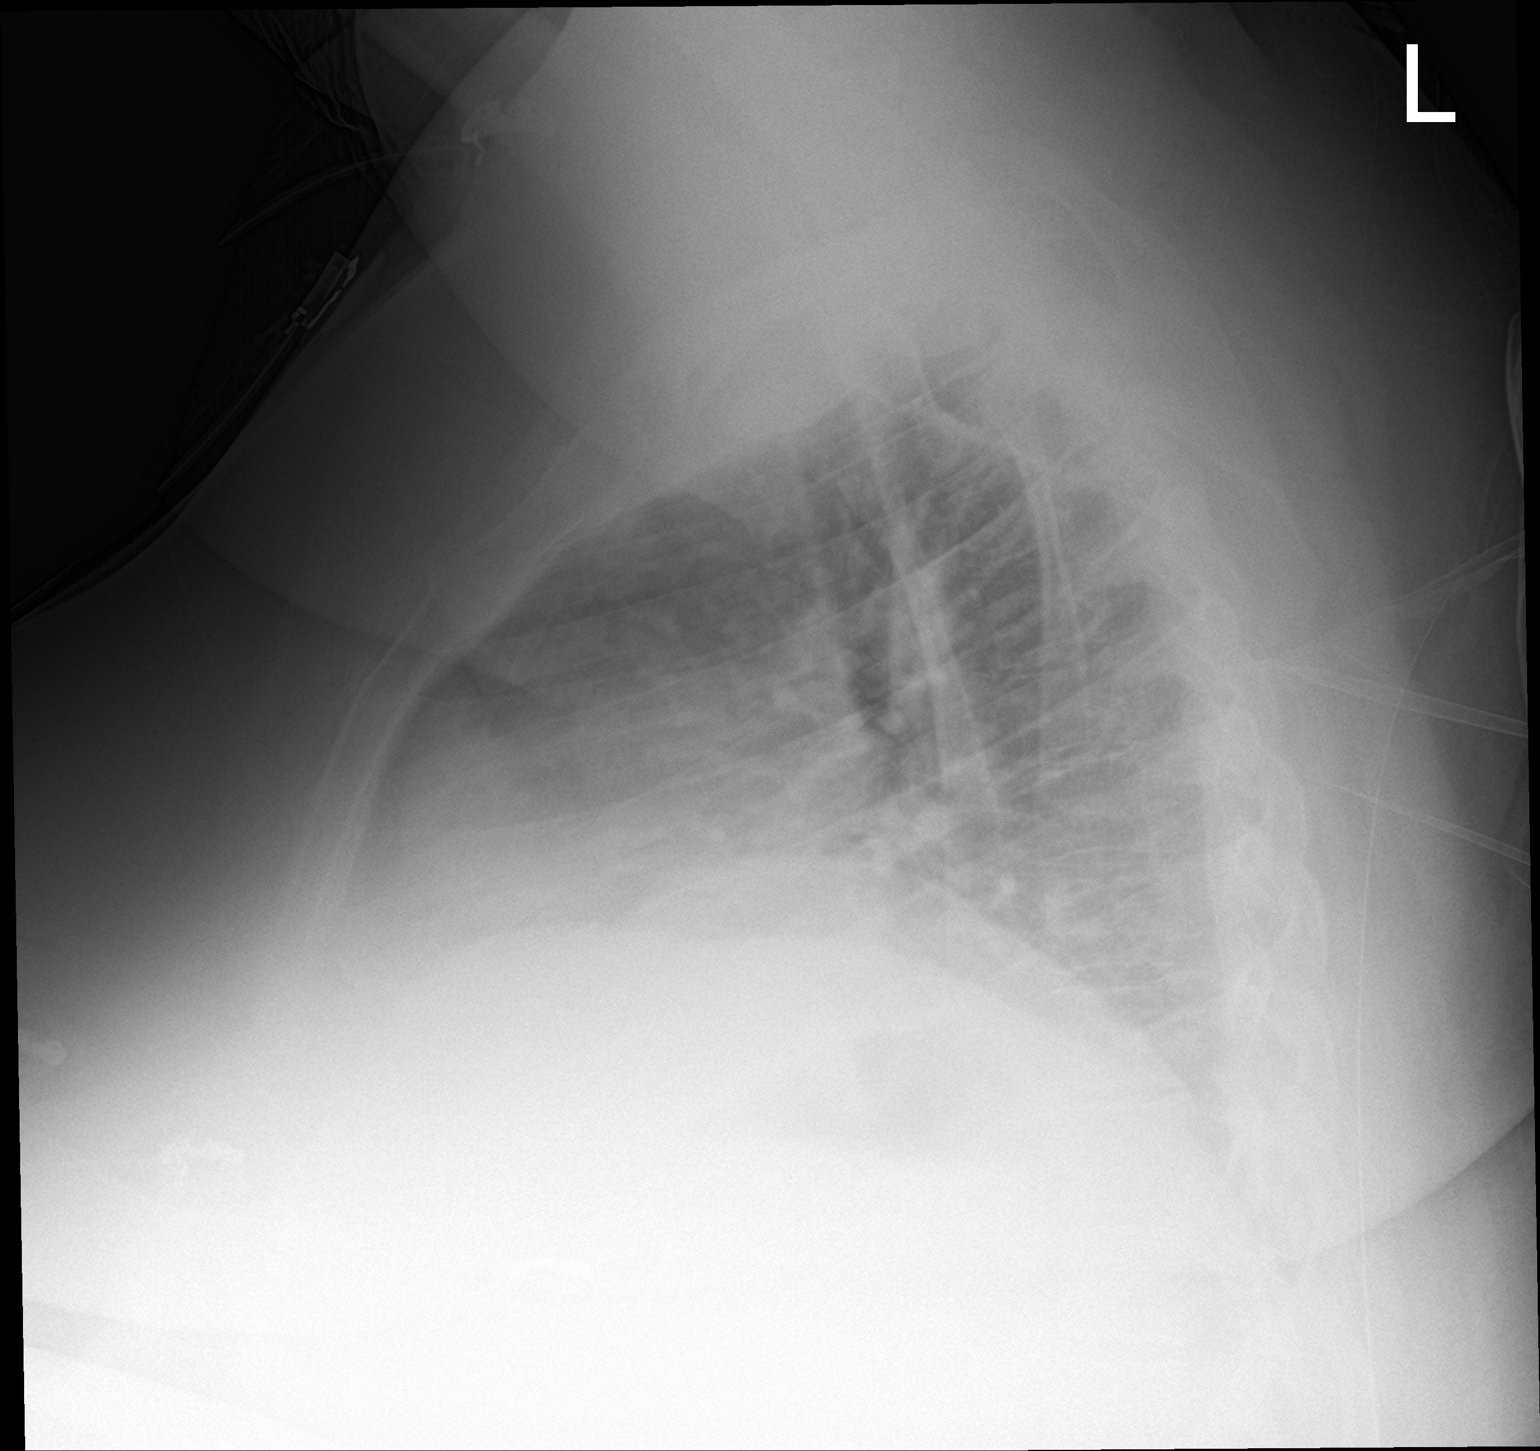

[chest ap]
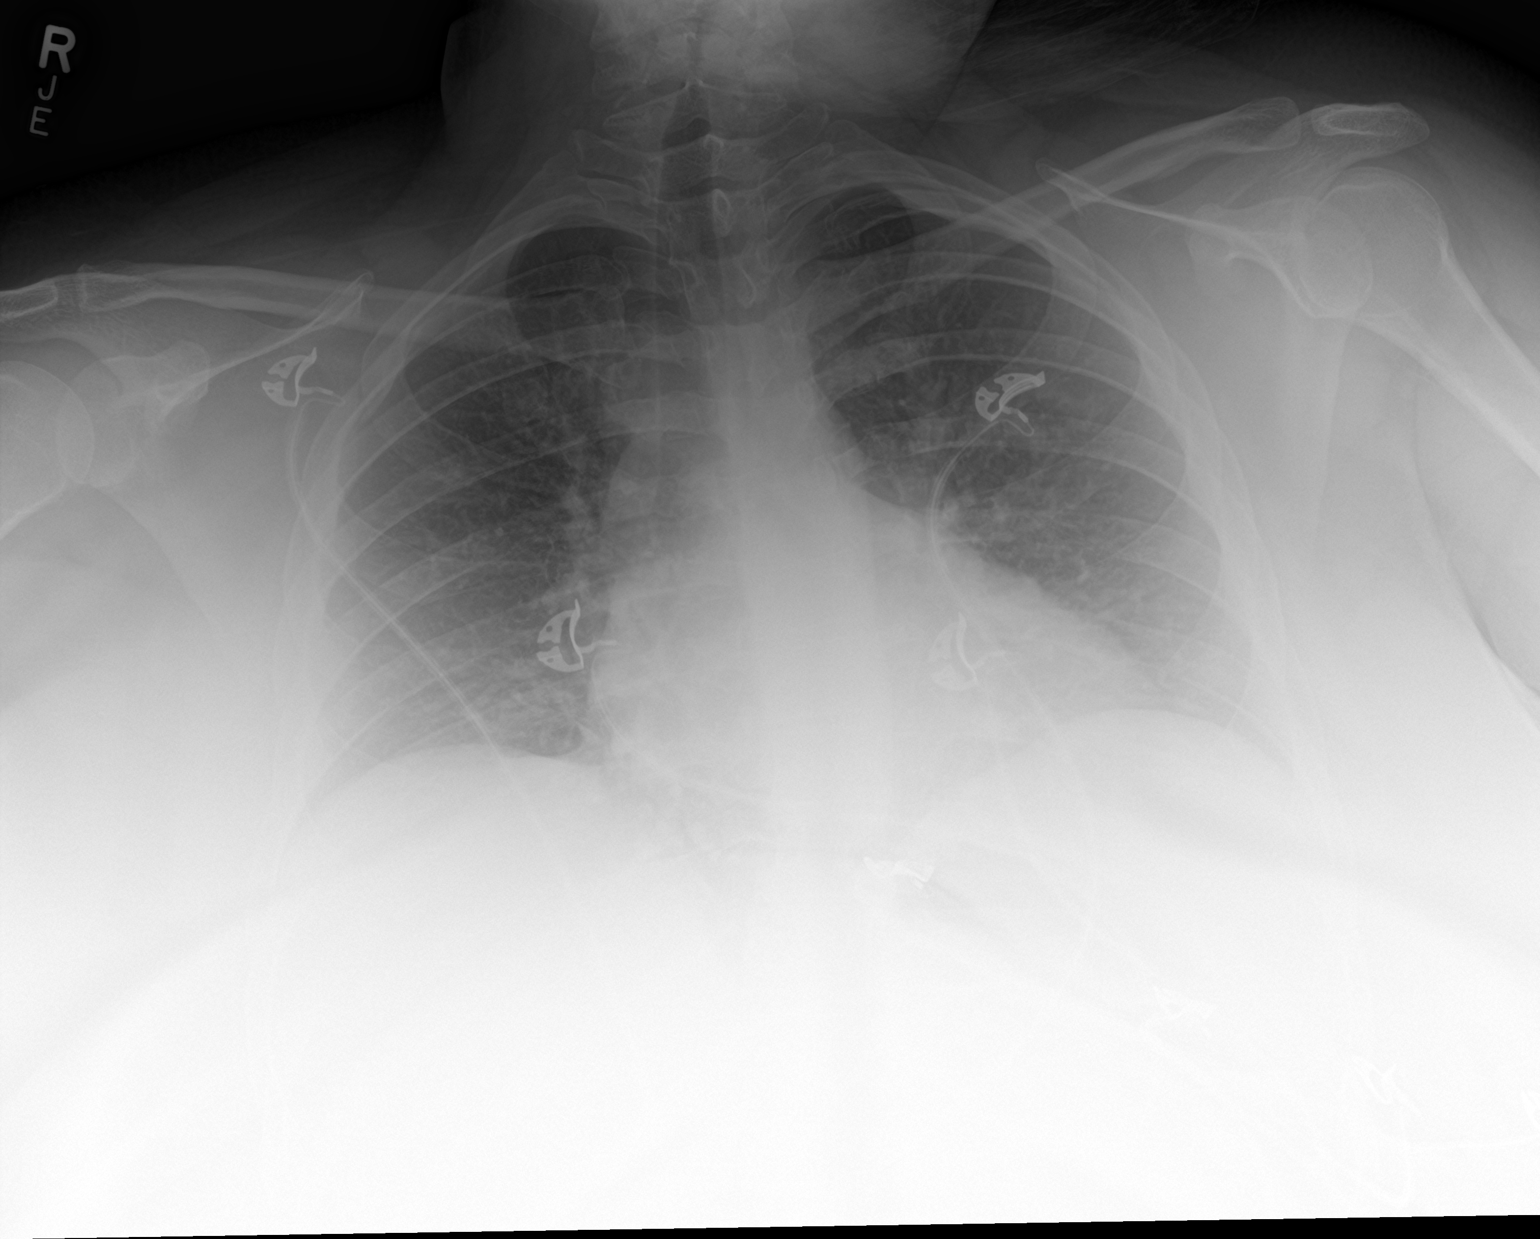

[2 of 2 positions shown; findings below may reference images not displayed]

FINDINGS: Patient body habitus result in increased attenuation towards the
lung bases. No convincing consolidative process is evident
accounting for body habitus and atelectatic changes. Stable
cardiomediastinal contours. No pneumothorax or visible effusion. No
other acute osseous or soft tissue abnormality. Telemetry leads
overlie the chest.
IMPRESSION: Study limited by body habitus.

Basilar atelectasis without other acute cardiopulmonary abnormality.

## 2021-03-07 MED ORDER — ALBUTEROL SULFATE (2.5 MG/3ML) 0.083% IN NEBU: 2.5000 mg | INHALATION_SOLUTION | Freq: Once | RESPIRATORY_TRACT | Status: AC

## 2021-03-07 MED ORDER — ONDANSETRON 4 MG PO TBDP
4.0000 mg | ORAL_TABLET | Freq: Once | ORAL | Status: AC
  Administered 2021-03-07: 4 mg via ORAL
  Filled 2021-03-07: qty 1

## 2021-03-07 MED ORDER — ALBUTEROL SULFATE (2.5 MG/3ML) 0.083% IN NEBU
5.0000 mg | INHALATION_SOLUTION | Freq: Once | RESPIRATORY_TRACT | Status: AC
  Administered 2021-03-07: 5 mg via RESPIRATORY_TRACT
  Filled 2021-03-07: qty 6

## 2021-03-07 MED ORDER — IPRATROPIUM-ALBUTEROL 0.5-2.5 (3) MG/3ML IN SOLN
RESPIRATORY_TRACT | Status: AC
  Administered 2021-03-07: 3 mL via RESPIRATORY_TRACT
  Filled 2021-03-07: qty 3

## 2021-03-07 MED ORDER — MAGNESIUM SULFATE 2 GM/50ML IV SOLN
2.0000 g | Freq: Once | INTRAVENOUS | Status: AC
  Administered 2021-03-07: 2 g via INTRAVENOUS
  Filled 2021-03-07: qty 50

## 2021-03-07 MED ORDER — FENTANYL CITRATE (PF) 100 MCG/2ML IJ SOLN
100.0000 ug | Freq: Once | INTRAMUSCULAR | Status: AC
  Administered 2021-03-07: 100 ug via INTRAVENOUS
  Filled 2021-03-07: qty 2

## 2021-03-07 MED ORDER — ALBUTEROL SULFATE (2.5 MG/3ML) 0.083% IN NEBU
INHALATION_SOLUTION | RESPIRATORY_TRACT | Status: AC
  Administered 2021-03-07: 2.5 mg via RESPIRATORY_TRACT
  Filled 2021-03-07: qty 3

## 2021-03-07 MED ORDER — ALBUTEROL SULFATE (2.5 MG/3ML) 0.083% IN NEBU
INHALATION_SOLUTION | RESPIRATORY_TRACT | Status: AC
  Filled 2021-03-07: qty 6

## 2021-03-07 MED ORDER — ALBUTEROL SULFATE (2.5 MG/3ML) 0.083% IN NEBU
5.0000 mg | INHALATION_SOLUTION | Freq: Once | RESPIRATORY_TRACT | Status: AC
  Administered 2021-03-07: 5 mg via RESPIRATORY_TRACT

## 2021-03-07 MED ORDER — IPRATROPIUM-ALBUTEROL 0.5-2.5 (3) MG/3ML IN SOLN: 3.0000 mL | Freq: Once | RESPIRATORY_TRACT | Status: AC

## 2021-03-07 NOTE — ED Provider Notes (Signed)
[facility] Provider Note: Lowella Dell, MD, FACEP  CSN: [id number] MRN: [id number] ARRIVAL: 03/06/21 at 2321 ROOM: MH02/MH02   CHIEF COMPLAINT  Chest Pain   HISTORY OF PRESENT ILLNESS  03/07/21 12:21 AM Felicia Bradley is a 30 y.o. female with a history of asthma.  She is here with chest pain that began about 30 minutes prior to arrival.  The pain is sharp and located just above the left breast.  It is well localized.  She rates it as a 9 out of 10.  It is worse with deep breaths, coughing, movement or palpation.  She has asthma and has been using her inhaler frequently recently due to shortness of breath.  She has been having a cough.  She states she did have some pain and swelling in her left leg recently for which she took Lasix.  She denies recent Covid-like illness and was vaccinated about a year ago.  She has also been having a painful nodule in the subcutaneous fat of the left lower abdominal wall.  This is not red or warm.  It is not draining.   Past Medical History:  Diagnosis Date  . Asthma   . Diabetes mellitus without complication (HCC)    type 2  . Hypertension   . Obesity   . Pulmonary embolism [facility])     Past Surgical History:  Procedure Laterality Date  . CESAREAN SECTION    . NO PAST SURGERIES      Family History  Problem Relation Age of Onset  . Diabetes Mother   . Asthma Mother   . Asthma Maternal Aunt   . Diabetes Maternal Aunt     Social History   Tobacco Use  . Smoking status: Never Smoker  . Smokeless tobacco: Never Used  Substance Use Topics  . Alcohol use: No  . Drug use: No    Prior to Admission medications   Medication Sig Start Date End Date Taking? Authorizing Provider  acetaminophen (TYLENOL) 325 MG tablet Take 325 mg by mouth every 6 (six) hours as needed for moderate pain.    [provider]  albuterol (VENTOLIN HFA) 108 (90 Base) MCG/ACT inhaler Inhale 1 puff into the lungs 2 (two) times daily as needed  for wheezing or shortness of breath.  11/10/14   [provider]  diclofenac Sodium (VOLTAREN) 1 % GEL Apply 2 g topically 4 (four) times daily. 06/28/20   Hall-Potvin, [place], PA-C  EPINEPHrine 0.3 mg/0.3 mL IJ SOAJ injection Inject 0.3 mg into the muscle as needed for anaphylaxis.  11/10/14   [provider]  ferrous sulfate 325 (65 FE) MG EC tablet Take 325 mg by mouth daily. 11/10/14   [provider]  ipratropium-albuterol (DUONEB) 0.5-2.5 (3) MG/3ML SOLN Take 3 mLs by nebulization 3 (three) times daily as needed (shortness of breath).  07/19/19   [provider]  methocarbamol (ROBAXIN) 500 MG tablet Take 1 tablet (500 mg total) by mouth 2 (two) times daily. 10/26/20   Garlon Hatchet, PA-C  Multiple Vitamins-Minerals (MULTIVITAMIN ADULT PO) Take 1 tablet by mouth daily.    [provider]  ondansetron (ZOFRAN ODT) 4 MG disintegrating tablet Take 1 tablet (4 mg total) by mouth every 8 (eight) hours as needed for nausea or vomiting. 07/08/20   Liberty Handy, PA-C    Allergies Contrast media [iodinated diagnostic agents], Dilaudid [hydromorphone], Tramadol, Apple, Banana, Dexamethasone, Ketorolac, Other, Solu-medrol [methylprednisolone], and Toradol [ketorolac tromethamine]   REVIEW OF SYSTEMS  Negative except as noted here or in the History of Present Illness.   PHYSICAL EXAMINATION  Initial Vital Signs Blood pressure 118/75, pulse (!) 113, temperature 98.6 F (37 C), temperature source Oral, resp. rate 20, height 5\' 3"  (1.6 m), weight 127 kg, last menstrual period 02/10/2021, SpO2 100 %.  Examination General: Well-developed, well-nourished female in no acute distress; appearance consistent with age of record HENT: normocephalic; atraumatic Eyes: pupils equal, round and reactive to light; extraocular muscles intact Neck: supple Heart: regular rate and rhythm Lungs: Decreased air movement bilaterally; coarse cough Chest: Well localized  left chest wall tenderness above the left breast  Abdomen: soft; nondistended; tender subcutaneous nodule of the left lower abdominal pannus; bowel sounds present Extremities: No deformity; full range of motion; pulses normal; trace edema of the lower legs Neurologic: Awake, alert and oriented; motor function intact in all extremities and symmetric; no facial droop Skin: Warm and dry Psychiatric: Normal mood and affect   RESULTS  Summary of this visit's results, reviewed and interpreted by myself:   EKG Interpretation  Date/Time:  Thursday March 06 2021 23:42:12 EDT Ventricular Rate:  98 PR Interval:    QRS Duration: 91 QT Interval:  327 QTC Calculation: 418 R Axis:   89 Text Interpretation: Normal sinus rhythm Borderline Q waves in inferior leads Probable inferior infarct, old Artifact in lead(s) I III aVL aVF V2 Rate is slower Confirmed by [id number] (Paula Libra) on 03/07/2021 12:01:14 AM      Laboratory Studies: Results for orders placed or performed during the hospital encounter of 03/06/21 (from the past 24 hour(s))  CBC with Differential/Platelet     Status: Abnormal   Collection Time: 03/06/21 11:50 PM  Result Value Ref Range   WBC 8.0 4.0 - 10.5 K/uL   RBC 4.07 3.87 - 5.11 MIL/uL   Hemoglobin 11.1 (L) 12.0 - 15.0 g/dL   HCT 03/08/21 96.2 - 22.9 %   MCV 89.2 80.0 - 100.0 fL   MCH 27.3 26.0 - 34.0 pg   MCHC 30.6 30.0 - 36.0 g/dL   RDW 79.8 (H) 92.1 - 19.4 %   Platelets 453 (H) 150 - 400 K/uL   nRBC 0.0 0.0 - 0.2 %   Neutrophils Relative % 52 %   Neutro Abs 4.2 1.7 - 7.7 K/uL   Lymphocytes Relative 41 %   Lymphs Abs 3.3 0.7 - 4.0 K/uL   Monocytes Relative 5 %   Monocytes Absolute 0.4 0.1 - 1.0 K/uL   Eosinophils Relative 1 %   Eosinophils Absolute 0.1 0.0 - 0.5 K/uL   Basophils Relative 1 %   Basophils Absolute 0.1 0.0 - 0.1 K/uL   Immature Granulocytes 0 %   Abs Immature Granulocytes 0.02 0.00 - 0.07 K/uL  Basic metabolic panel     Status: Abnormal   Collection Time:  03/06/21 11:50 PM  Result Value Ref Range   Sodium 138 135 - 145 mmol/L   Potassium 3.4 (L) 3.5 - 5.1 mmol/L   Chloride 104 98 - 111 mmol/L   CO2 25 22 - 32 mmol/L   Glucose, Bld 132 (H) 70 - 99 mg/dL   BUN 8 6 - 20 mg/dL   Creatinine, Ser 03/08/21 0.44 - 1.00 mg/dL   Calcium 8.9 8.9 - 0.81 mg/dL   GFR, Estimated 44.8 >18 mL/min   Anion gap 9 5 - 15  D-dimer, quantitative     Status: None   Collection Time: 03/06/21 11:50 PM  Result Value Ref Range  D-Dimer, Quant 0.31 0.00 - 0.50 ug/mL-FEU   Imaging Studies: DG Chest 2 View  Result Date: 03/07/2021 CLINICAL DATA:  Shortness of breath, chest pain EXAM: CHEST - 2 VIEW COMPARISON:  Radiograph 07/23/2020 FINDINGS: Patient body habitus result in increased attenuation towards the lung bases. No convincing consolidative process is evident accounting for body habitus and atelectatic changes. Stable cardiomediastinal contours. No pneumothorax or visible effusion. No other acute osseous or soft tissue abnormality. Telemetry leads overlie the chest. IMPRESSION: Study limited by body habitus. Basilar atelectasis without other acute cardiopulmonary abnormality. Electronically Signed   By: Kreg Shropshire M.D.   On: 03/07/2021 01:56    ED COURSE and MDM  Nursing notes, initial and subsequent vitals signs, including pulse oximetry, reviewed and interpreted by myself.  Vitals:   03/07/21 0045 03/07/21 0057 03/07/21 0110 03/07/21 0111  BP:   (!) 134/93   Pulse:   (!) 114   Resp:   18   Temp:      TempSrc:      SpO2: 100% 93% 96% 96%  Weight:      Height:       Medications  magnesium sulfate IVPB 2 g 50 mL (2 g Intravenous New Bag/Given 03/07/21 0134)  ondansetron (ZOFRAN-ODT) disintegrating tablet 4 mg (4 mg Oral Given 03/07/21 0051)  fentaNYL (SUBLIMAZE) injection 100 mcg (100 mcg Intravenous Given 03/07/21 0111)  albuterol (PROVENTIL) (2.5 MG/3ML) 0.083% nebulizer solution 2.5 mg (2.5 mg Nebulization Given 03/07/21 0041)  ipratropium-albuterol  (DUONEB) 0.5-2.5 (3) MG/3ML nebulizer solution 3 mL (3 mLs Nebulization Given 03/07/21 0041)  albuterol (PROVENTIL) (2.5 MG/3ML) 0.083% nebulizer solution 5 mg (5 mg Nebulization Given 03/07/21 0054)  albuterol (PROVENTIL) (2.5 MG/3ML) 0.083% nebulizer solution 5 mg (5 mg Nebulization Given 03/07/21 0108)   2:25 AM Air movement improved after albuterol and ipratropium treatment.  Patient given AeroChamber for her albuterol inhaler.  She was given magnesium sulfate IV but steroids were avoided as she does not tolerate them.  Presentation is consistent with an acute bronchitis.  The pain in her chest appears to be chest wall related.  She has no EKG chest changes, the pain is reproducible externally, and her D-dimer is normal.  She is already on 30 mg of oxycodone daily and has been for several months so I do not believe additional narcotics are indicated.  Also she is allergic to NSAIDs so she was advised to take Tylenol as needed for pain.   PROCEDURES  Procedures   ED DIAGNOSES     ICD-10-CM   1. Acute bronchitis with bronchospasm  J20.9   2. Chest wall pain  R07.89        Paula Libra, MD 03/07/21 [id number]

## 2021-03-18 ENCOUNTER — Emergency Department (HOSPITAL_BASED_OUTPATIENT_CLINIC_OR_DEPARTMENT_OTHER): Payer: Medicaid Other

## 2021-03-18 ENCOUNTER — Inpatient Hospital Stay (HOSPITAL_BASED_OUTPATIENT_CLINIC_OR_DEPARTMENT_OTHER)
Admission: EM | Admit: 2021-03-18 | Discharge: 2021-03-21 | DRG: 202 | Disposition: A | Discharge status: Home / Self Care | Payer: Medicaid Other | Attending: Internal Medicine | Admitting: Internal Medicine

## 2021-03-18 ENCOUNTER — Encounter (HOSPITAL_BASED_OUTPATIENT_CLINIC_OR_DEPARTMENT_OTHER): Payer: Self-pay

## 2021-03-18 ENCOUNTER — Other Ambulatory Visit: Payer: Self-pay

## 2021-03-18 DIAGNOSIS — Z91041 Radiographic dye allergy status: Secondary | ICD-10-CM

## 2021-03-18 DIAGNOSIS — E119 Type 2 diabetes mellitus without complications: Secondary | ICD-10-CM | POA: Diagnosis present

## 2021-03-18 DIAGNOSIS — R079 Chest pain, unspecified: Secondary | ICD-10-CM

## 2021-03-18 DIAGNOSIS — Z888 Allergy status to other drugs, medicaments and biological substances status: Secondary | ICD-10-CM

## 2021-03-18 DIAGNOSIS — Z91018 Allergy to other foods: Secondary | ICD-10-CM

## 2021-03-18 DIAGNOSIS — Z8616 Personal history of COVID-19: Secondary | ICD-10-CM | POA: Diagnosis not present

## 2021-03-18 DIAGNOSIS — E662 Morbid (severe) obesity with alveolar hypoventilation: Secondary | ICD-10-CM | POA: Diagnosis not present

## 2021-03-18 DIAGNOSIS — Z86711 Personal history of pulmonary embolism: Secondary | ICD-10-CM

## 2021-03-18 DIAGNOSIS — Z86718 Personal history of other venous thrombosis and embolism: Secondary | ICD-10-CM

## 2021-03-18 DIAGNOSIS — R072 Precordial pain: Secondary | ICD-10-CM | POA: Diagnosis not present

## 2021-03-18 DIAGNOSIS — Z7951 Long term (current) use of inhaled steroids: Secondary | ICD-10-CM

## 2021-03-18 DIAGNOSIS — J4551 Severe persistent asthma with (acute) exacerbation: Secondary | ICD-10-CM | POA: Diagnosis present

## 2021-03-18 DIAGNOSIS — Z20822 Contact with and (suspected) exposure to covid-19: Secondary | ICD-10-CM | POA: Diagnosis present

## 2021-03-18 DIAGNOSIS — R6510 Systemic inflammatory response syndrome (SIRS) of non-infectious origin without acute organ dysfunction: Secondary | ICD-10-CM

## 2021-03-18 DIAGNOSIS — R0602 Shortness of breath: Secondary | ICD-10-CM

## 2021-03-18 DIAGNOSIS — F319 Bipolar disorder, unspecified: Secondary | ICD-10-CM | POA: Diagnosis present

## 2021-03-18 DIAGNOSIS — Z79899 Other long term (current) drug therapy: Secondary | ICD-10-CM

## 2021-03-18 DIAGNOSIS — R55 Syncope and collapse: Secondary | ICD-10-CM | POA: Diagnosis present

## 2021-03-18 DIAGNOSIS — Z9981 Dependence on supplemental oxygen: Secondary | ICD-10-CM

## 2021-03-18 DIAGNOSIS — B9729 Other coronavirus as the cause of diseases classified elsewhere: Secondary | ICD-10-CM | POA: Diagnosis not present

## 2021-03-18 DIAGNOSIS — Z885 Allergy status to narcotic agent status: Secondary | ICD-10-CM

## 2021-03-18 DIAGNOSIS — D649 Anemia, unspecified: Secondary | ICD-10-CM | POA: Diagnosis present

## 2021-03-18 DIAGNOSIS — Z833 Family history of diabetes mellitus: Secondary | ICD-10-CM

## 2021-03-18 DIAGNOSIS — R001 Bradycardia, unspecified: Secondary | ICD-10-CM | POA: Diagnosis not present

## 2021-03-18 DIAGNOSIS — Z79891 Long term (current) use of opiate analgesic: Secondary | ICD-10-CM

## 2021-03-18 DIAGNOSIS — Z825 Family history of asthma and other chronic lower respiratory diseases: Secondary | ICD-10-CM

## 2021-03-18 DIAGNOSIS — I119 Hypertensive heart disease without heart failure: Secondary | ICD-10-CM | POA: Diagnosis not present

## 2021-03-18 DIAGNOSIS — Z713 Dietary counseling and surveillance: Secondary | ICD-10-CM

## 2021-03-18 DIAGNOSIS — E876 Hypokalemia: Secondary | ICD-10-CM | POA: Diagnosis not present

## 2021-03-18 DIAGNOSIS — R1110 Vomiting, unspecified: Secondary | ICD-10-CM

## 2021-03-18 DIAGNOSIS — Z7982 Long term (current) use of aspirin: Secondary | ICD-10-CM

## 2021-03-18 DIAGNOSIS — J45901 Unspecified asthma with (acute) exacerbation: Secondary | ICD-10-CM | POA: Diagnosis present

## 2021-03-18 DIAGNOSIS — T380X5A Adverse effect of glucocorticoids and synthetic analogues, initial encounter: Secondary | ICD-10-CM | POA: Diagnosis present

## 2021-03-18 DIAGNOSIS — J9621 Acute and chronic respiratory failure with hypoxia: Secondary | ICD-10-CM | POA: Diagnosis present

## 2021-03-18 HISTORY — DX: Bipolar disorder, unspecified: F31.9

## 2021-03-18 LAB — CBC WITH DIFFERENTIAL/PLATELET
Abs Immature Granulocytes: 0.02 10*3/uL (ref 0.00–0.07)
Basophils Absolute: 0 10*3/uL (ref 0.0–0.1)
Basophils Relative: 0 %
Eosinophils Absolute: 0.1 10*3/uL (ref 0.0–0.5)
Eosinophils Relative: 1 %
HCT: 34.5 % — ABNORMAL LOW (ref 36.0–46.0)
Hemoglobin: 10.6 g/dL — ABNORMAL LOW (ref 12.0–15.0)
Immature Granulocytes: 0 %
Lymphocytes Relative: 38 %
Lymphs Abs: 3 10*3/uL (ref 0.7–4.0)
MCH: 27.3 pg (ref 26.0–34.0)
MCHC: 30.7 g/dL (ref 30.0–36.0)
MCV: 88.9 fL (ref 80.0–100.0)
Monocytes Absolute: 0.3 10*3/uL (ref 0.1–1.0)
Monocytes Relative: 4 %
Neutro Abs: 4.4 10*3/uL (ref 1.7–7.7)
Neutrophils Relative %: 57 %
Platelets: 387 10*3/uL (ref 150–400)
RBC: 3.88 MIL/uL (ref 3.87–5.11)
RDW: 15.5 % (ref 11.5–15.5)
WBC: 7.8 10*3/uL (ref 4.0–10.5)
nRBC: 0 % (ref 0.0–0.2)

## 2021-03-18 LAB — APTT: aPTT: 29 seconds (ref 24–36)

## 2021-03-18 LAB — COMPREHENSIVE METABOLIC PANEL
ALT: 21 U/L (ref 0–44)
AST: 24 U/L (ref 15–41)
Albumin: 3.1 g/dL — ABNORMAL LOW (ref 3.5–5.0)
Alkaline Phosphatase: 59 U/L (ref 38–126)
Anion gap: 9 (ref 5–15)
BUN: 6 mg/dL (ref 6–20)
CO2: 26 mmol/L (ref 22–32)
Calcium: 8.6 mg/dL — ABNORMAL LOW (ref 8.9–10.3)
Chloride: 106 mmol/L (ref 98–111)
Creatinine, Ser: 0.63 mg/dL (ref 0.44–1.00)
GFR, Estimated: >60 mL/min (ref >60)
Glucose, Bld: 113 mg/dL — ABNORMAL HIGH (ref 70–99)
Potassium: 3.3 mmol/L — ABNORMAL LOW (ref 3.5–5.1)
Sodium: 141 mmol/L (ref 135–145)
Total Bilirubin: 0.1 mg/dL — ABNORMAL LOW (ref 0.3–1.2)
Total Protein: 6.9 g/dL (ref 6.5–8.1)

## 2021-03-18 LAB — PROTIME-INR
INR: 1 (ref 0.8–1.2)
Prothrombin Time: 13 seconds (ref 11.4–15.2)

## 2021-03-18 LAB — RESP PANEL BY RT-PCR (FLU A&B, COVID) ARPGX2
Influenza A by PCR: NEGATIVE
Influenza B by PCR: NEGATIVE
SARS Coronavirus 2 by RT PCR: NEGATIVE

## 2021-03-18 LAB — GROUP A STREP BY PCR: Group A Strep by PCR: NOT DETECTED

## 2021-03-18 LAB — TROPONIN I (HIGH SENSITIVITY): Troponin I (High Sensitivity): <2 ng/L (ref <18)

## 2021-03-18 LAB — LACTIC ACID, PLASMA
Lactic Acid, Venous: 2 mmol/L (ref 0.5–1.9)
Lactic Acid, Venous: 1.1 mmol/L (ref 0.5–1.9)

## 2021-03-18 IMAGING — DX DG CHEST 1V PORT
1 series · 1 of 1 positions shown · non-contrast
Comparison: [DATE]

CLINICAL DATA: Asthma, shortness of breath, vomiting, and cough.

EXAM:
PORTABLE CHEST 1 VIEW

[chest ap]
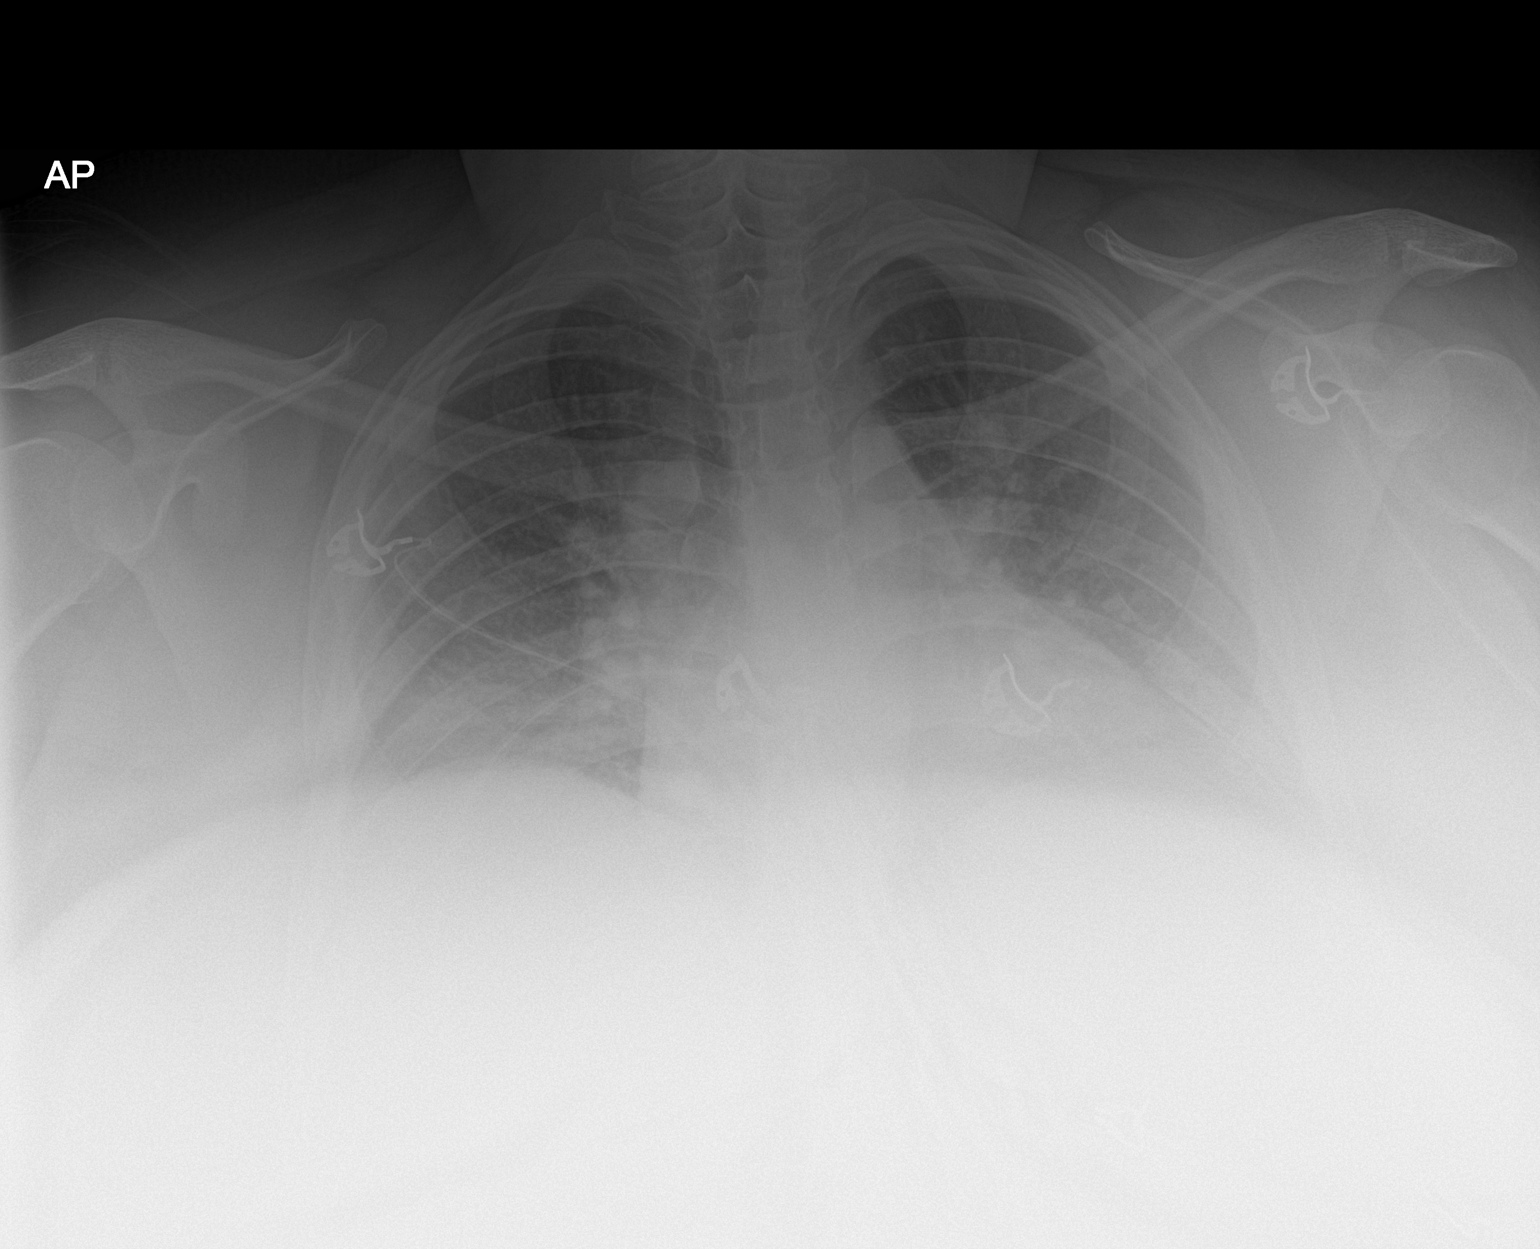

[1 of 1 positions shown; findings below may reference images not displayed]

FINDINGS: Shallow inspiration. Atelectasis in the lung bases. Heart size and
pulmonary vascularity are likely normal for technique. No apparent
pleural effusion or pneumothorax. Mediastinal contours appear
intact. Similar appearance to prior study.
IMPRESSION: Shallow inspiration with atelectasis in the lung bases.

## 2021-03-18 MED ORDER — IPRATROPIUM BROMIDE HFA 17 MCG/ACT IN AERS
2.0000 | INHALATION_SPRAY | Freq: Once | RESPIRATORY_TRACT | Status: AC
  Administered 2021-03-18: 2 via RESPIRATORY_TRACT
  Filled 2021-03-18: qty 12.9

## 2021-03-18 MED ORDER — ACETAMINOPHEN 500 MG PO TABS
1000.0000 mg | ORAL_TABLET | Freq: Once | ORAL | Status: AC
  Administered 2021-03-18: 1000 mg via ORAL
  Filled 2021-03-18: qty 2

## 2021-03-18 MED ORDER — ALBUTEROL (5 MG/ML) CONTINUOUS INHALATION SOLN
10.0000 mg/h | INHALATION_SOLUTION | RESPIRATORY_TRACT | Status: DC
  Administered 2021-03-18: 10 mg/h via RESPIRATORY_TRACT
  Filled 2021-03-18: qty 20

## 2021-03-18 MED ORDER — ONDANSETRON 4 MG PO TBDP: 4.0000 mg | ORAL_TABLET | Freq: Once | ORAL | Status: AC | PRN

## 2021-03-18 MED ORDER — ALBUTEROL SULFATE HFA 108 (90 BASE) MCG/ACT IN AERS
4.0000 | INHALATION_SPRAY | Freq: Once | RESPIRATORY_TRACT | Status: AC
  Administered 2021-03-18: 4 via RESPIRATORY_TRACT
  Filled 2021-03-18: qty 6.7

## 2021-03-18 MED ORDER — ONDANSETRON 4 MG PO TBDP
ORAL_TABLET | ORAL | Status: AC
  Administered 2021-03-18: 4 mg via ORAL
  Filled 2021-03-18: qty 1

## 2021-03-18 MED ORDER — MAGNESIUM SULFATE 2 GM/50ML IV SOLN
2.0000 g | Freq: Once | INTRAVENOUS | Status: AC
  Administered 2021-03-18: 2 g via INTRAVENOUS
  Filled 2021-03-18: qty 50

## 2021-03-18 MED ORDER — METHYLPREDNISOLONE SODIUM SUCC 125 MG IJ SOLR
125.0000 mg | Freq: Once | INTRAMUSCULAR | Status: AC
  Administered 2021-03-18: 125 mg via INTRAVENOUS
  Filled 2021-03-18: qty 2

## 2021-03-18 MED ORDER — FENTANYL CITRATE (PF) 100 MCG/2ML IJ SOLN
50.0000 ug | Freq: Once | INTRAMUSCULAR | Status: AC
  Administered 2021-03-18: 50 ug via INTRAVENOUS
  Filled 2021-03-18: qty 2

## 2021-03-18 NOTE — ED Triage Notes (Signed)
Asthma. Sob, vomiting, and cough.

## 2021-03-18 NOTE — ED Provider Notes (Signed)
[facility] EMERGENCY DEPARTMENT Provider Note   CSN: [id number] Arrival date & time: 03/18/21  1915     History Chief Complaint  Patient presents with  . Cough  . Shortness of Breath  . Emesis    Felicia Bradley is a 30 y.o. female with past medical history of asthma, diabetes, hypertension, obesity, history of PE that presents the emerge department today for shortness of breath, cough, fevers, vomiting that began 3 days ago.  Patient states that about 2 weeks ago she had a upper respiratory infection, had completely resolved and then a couple days ago started having worsening shortness of breath cough and vomiting.  Worsened last time.  Patient states that she cannot keep anything down, states that she did have diarrhea last week, had resolved.  Patient states that she has 2 sick contacts at home, her husband and someone at work, unsure if anyone around her was diagnosed with Covid.  Patient states that she has been double vaccinated, has not been boosted.  States that she has had a fever at home, has been trying to control with Tylenol without relief.  States that in regards to her shortness of breath, she feels as if this is most likely related to her asthma, has been taking her nebulizers and albuterol without any relief at home.  States that she cannot go to work today because of her shortness of breath.  States that her chest feels tight, the center her for her chest.  States that cough is productive, is producing mucus, no hemoptysis.  No hematemesis.  Does have history of PEs, states that her last PE was in 2016, has not been taking her Coumadin because her PCP took her off of it due to her anemia and vaginal blood clots.  HPI     Past Medical History:  Diagnosis Date  . Asthma   . Diabetes mellitus without complication (HCC)    type 2  . Hypertension   . Obesity   . Pulmonary embolism [facility])     Patient Active Problem List   Diagnosis Date Noted  . Malingering  01/22/2020  . Acute respiratory disease due to COVID-19 virus 01/18/2020  . Asthma 01/18/2020  . Asthma exacerbation 01/14/2020  . Type 2 DM with CKD and hypertension (HCC) 01/14/2020  . OSA (obstructive sleep apnea) 01/14/2020  . Suspected COVID-19 virus infection     Past Surgical History:  Procedure Laterality Date  . CESAREAN SECTION    . NO PAST SURGERIES       OB History    Gravida  3   Para      Term      Preterm      AB  2   Living  0     SAB  1   IAB  1   Ectopic      Multiple      Live Births              Family History  Problem Relation Age of Onset  . Diabetes Mother   . Asthma Mother   . Asthma Maternal Aunt   . Diabetes Maternal Aunt     Social History   Tobacco Use  . Smoking status: Never Smoker  . Smokeless tobacco: Never Used  Substance Use Topics  . Alcohol use: No  . Drug use: No    Home Medications Prior to Admission medications   Medication Sig Start Date End Date Taking? Authorizing Provider  acetaminophen (  TYLENOL) 325 MG tablet Take 325 mg by mouth every 6 (six) hours as needed for moderate pain.    [provider]  albuterol (VENTOLIN HFA) 108 (90 Base) MCG/ACT inhaler Inhale 1 puff into the lungs 2 (two) times daily as needed for wheezing or shortness of breath.  11/10/14   [provider]  diclofenac Sodium (VOLTAREN) 1 % GEL Apply 2 g topically 4 (four) times daily. 06/28/20   Hall-Potvin, [place], PA-C  EPINEPHrine 0.3 mg/0.3 mL IJ SOAJ injection Inject 0.3 mg into the muscle as needed for anaphylaxis.  11/10/14   [provider]  ferrous sulfate 325 (65 FE) MG EC tablet Take 325 mg by mouth daily. 11/10/14   [provider]  ipratropium-albuterol (DUONEB) 0.5-2.5 (3) MG/3ML SOLN Take 3 mLs by nebulization 3 (three) times daily as needed (shortness of breath).  07/19/19   [provider]  methocarbamol (ROBAXIN) 500 MG tablet Take 1 tablet (500 mg total) by mouth 2 (two)  times daily. 10/26/20   Garlon Hatchet, PA-C  Multiple Vitamins-Minerals (MULTIVITAMIN ADULT PO) Take 1 tablet by mouth daily.    [provider]  ondansetron (ZOFRAN ODT) 4 MG disintegrating tablet Take 1 tablet (4 mg total) by mouth every 8 (eight) hours as needed for nausea or vomiting. 07/08/20   Liberty Handy, PA-C    Allergies    Contrast media [iodinated diagnostic agents], Dilaudid [hydromorphone], Tramadol, Apple, Banana, Dexamethasone, Ketorolac, Other, Solu-medrol [methylprednisolone], and Toradol [ketorolac tromethamine]  Review of Systems   Review of Systems  Constitutional: Positive for fever. Negative for chills, diaphoresis and fatigue.  HENT: Negative for congestion, sore throat and trouble swallowing.   Eyes: Negative for pain and visual disturbance.  Respiratory: Positive for cough, chest tightness, shortness of breath and wheezing.   Cardiovascular: Negative for chest pain, palpitations and leg swelling.  Gastrointestinal: Positive for diarrhea, nausea and vomiting. Negative for abdominal distention and abdominal pain.  Genitourinary: Negative for difficulty urinating.  Musculoskeletal: Negative for back pain, neck pain and neck stiffness.  Skin: Negative for pallor.  Neurological: Negative for dizziness, speech difficulty, weakness and headaches.  Psychiatric/Behavioral: Negative for confusion.    Physical Exam Updated Vital Signs BP (!) 141/118 (BP Location: Left Arm)   Pulse (!) 102   Temp 98.3 F (36.8 C)   Resp 20   Ht 5\' 3"  (1.6 m)   Wt 127 kg   LMP 03/11/2021   SpO2 97%   BMI 49.60 kg/m   Physical Exam Constitutional:      General: She is in acute distress.     Appearance: Normal appearance. She is obese. She is not ill-appearing, toxic-appearing or diaphoretic.     Comments: Patient in acute distress, speaking to me in short phrases, audible wheezes heard.  Wheezing accessory muscle use, is tachypneic.  Slightly tachycardic as well,  however satting at 100% on room air.  HENT:     Mouth/Throat:     Mouth: Mucous membranes are moist.     Pharynx: Oropharynx is clear.  Eyes:     General: No scleral icterus.    Extraocular Movements: Extraocular movements intact.     Pupils: Pupils are equal, round, and reactive to light.  Cardiovascular:     Rate and Rhythm: Regular rhythm. Tachycardia present.     Pulses: Normal pulses.     Heart sounds: Normal heart sounds.  Pulmonary:     Effort: Tachypnea, accessory muscle usage and respiratory distress present.  Breath sounds: No stridor. Decreased breath sounds and wheezing present. No rhonchi or rales.  Chest:     Chest wall: No tenderness.  Abdominal:     General: Abdomen is flat. There is no distension.     Palpations: Abdomen is soft.     Tenderness: There is no abdominal tenderness. There is no guarding or rebound.  Musculoskeletal:        General: No swelling or tenderness. Normal range of motion.     Cervical back: Normal range of motion and neck supple. No rigidity.     Right lower leg: No edema.     Left lower leg: No edema.  Skin:    General: Skin is warm and dry.     Capillary Refill: Capillary refill takes less than 2 seconds.     Coloration: Skin is not pale.  Neurological:     General: No focal deficit present.     Mental Status: She is alert and oriented to person, place, and time.  Psychiatric:        Mood and Affect: Mood normal.        Behavior: Behavior normal.     ED Results / Procedures / Treatments   Labs (all labs ordered are listed, but only abnormal results are displayed) Labs Reviewed  LACTIC ACID, PLASMA - Abnormal; Notable for the following components:      Result Value   Lactic Acid, Venous 2.0 (*)    All other components within normal limits  COMPREHENSIVE METABOLIC PANEL - Abnormal; Notable for the following components:   Potassium 3.3 (*)    Glucose, Bld 113 (*)    Calcium 8.6 (*)    Albumin 3.1 (*)    Total Bilirubin 0.1  (*)    All other components within normal limits  CBC WITH DIFFERENTIAL/PLATELET - Abnormal; Notable for the following components:   Hemoglobin 10.6 (*)    HCT 34.5 (*)    All other components within normal limits  RESP PANEL BY RT-PCR (FLU A&B, COVID) ARPGX2  GROUP A STREP BY PCR  CULTURE, BLOOD (ROUTINE X 2)  CULTURE, BLOOD (ROUTINE X 2)  URINE CULTURE  LACTIC ACID, PLASMA  PROTIME-INR  APTT  URINALYSIS, ROUTINE W REFLEX MICROSCOPIC  PREGNANCY, URINE  TROPONIN I (HIGH SENSITIVITY)    EKG None  Radiology DG Chest Port 1 View  Result Date: 03/18/2021 CLINICAL DATA:  Asthma, shortness of breath, vomiting, and cough. EXAM: PORTABLE CHEST 1 VIEW COMPARISON:  03/07/2021 FINDINGS: Shallow inspiration. Atelectasis in the lung bases. Heart size and pulmonary vascularity are likely normal for technique. No apparent pleural effusion or pneumothorax. Mediastinal contours appear intact. Similar appearance to prior study. IMPRESSION: Shallow inspiration with atelectasis in the lung bases. Electronically Signed   By: Burman Nieves M.D.   On: 03/18/2021 21:33    Procedures .Critical Care Performed by: Farrel Gordon, PA-C Authorized by: Farrel Gordon, PA-C   Critical care provider statement:    Critical care time (minutes):  45   Critical care was necessary to treat or prevent imminent or life-threatening deterioration of the following conditions:  Respiratory failure   Critical care was time spent personally by me on the following activities:  Discussions with consultants, evaluation of patient's response to treatment, examination of patient, ordering and performing treatments and interventions, ordering and review of laboratory studies, ordering and review of radiographic studies, pulse oximetry, re-evaluation of patient's condition, obtaining history from patient or surrogate and review of old charts  Medications Ordered in ED Medications  albuterol (PROVENTIL,VENTOLIN) solution  continuous neb (10 mg/hr Nebulization New Bag/Given 03/18/21 2153)  ondansetron (ZOFRAN-ODT) disintegrating tablet 4 mg (4 mg Oral Given 03/18/21 1938)  magnesium sulfate IVPB 2 g 50 mL (0 g Intravenous Stopped 03/18/21 2137)  methylPREDNISolone sodium succinate (SOLU-MEDROL) 125 mg/2 mL injection 125 mg (125 mg Intravenous Given 03/18/21 2032)  albuterol (VENTOLIN HFA) 108 (90 Base) MCG/ACT inhaler 4 puff (4 puffs Inhalation Given 03/18/21 2006)  ipratropium (ATROVENT HFA) inhaler 2 puff (2 puffs Inhalation Given 03/18/21 2006)  acetaminophen (TYLENOL) tablet 1,000 mg (1,000 mg Oral Given 03/18/21 2039)  fentaNYL (SUBLIMAZE) injection 50 mcg (50 mcg Intravenous Given 03/18/21 2216)    ED Course  I have reviewed the triage vital signs and the nursing notes.  Pertinent labs & imaging results that were available during my care of the patient were reviewed by me and considered in my medical decision making (see chart for details).    MDM Rules/Calculators/A&P                          Felicia Bradley is a 30 y.o. female with past medical history of asthma, diabetes, hypertension, obesity, history of PE that presents the emerge department today for shortness of breath, cough, fevers, vomiting that began 3 days ago.  Patient is having difficulty breathing, however is satting in 100% on room air.  Does have tachypnea, accessory muscle use and is tachycardic.  Patient also does have temperature of 100.8.  Pulmonary etiology suspected.  Patient does meet SIRS criteria with temperature, tachycardia and tachypnea, will order sepsis work-up.  Covid is on the differential, hold off on fluids and antibiotics until this has resulted.  Do suspect asthma exacerbation, will start magnesium, steroids, albuterol and Atrovent. Patient states that she is already done multiple nebulizer treatments prior to coming here.  Upon reevaluation an hour later, patient is still diffusely wheezing, tachypnea has slightly resolved. Is able to  speak to me in full sentences now. Will start continuous neb.  Work-up today with Covid negative,  CBC and CMP unremarkable, no leukocytosis.  Chest x-ray without any signs of pneumonia or infection.  I do not think patient will need antibiotics or fluids at this time with these findings, tachycardia most likely from asthma exacerbation, lactic acid 2, repeat normal.  Troponin less than 2 which is reassuring, low likelihood for PE however unable to fully rule this out at this time.  Less likely to be PE since patient has diffuse wheezing on exam. I think this is all stemming from asthma exacerbation secondary to viral illness.   Upon reevaluation after DuoNeb, patient is still diffusely wheezing, however much improved,  patient most likely to be admitted for asthma exacerbation due to continuous wheezing and SOB.  Did discuss that if she continues to feel short of breath they can discuss PE studies, patient is allergic to contrast and would need a nuclear medicine study which is unavailable here.  Patient agreeable with plan.  Patient admitted to Dr. Toniann Fail.  The patient appears reasonably stabilized for admission considering the current resources, flow, and capabilities available in the ED at this time, and I doubt any other [facility] requiring further screening and/or treatment in the ED prior to admission.   Final Clinical Impression(s) / ED Diagnoses Final diagnoses:  Severe persistent asthma with exacerbation    Rx / DC Orders ED Discharge Orders    None  Farrel Gordonatel, Emmanuela Ghazi, PA-C 03/18/21 2311    Arby BarrettePfeiffer, Marcy, MD 03/26/21 1201

## 2021-03-19 DIAGNOSIS — R1111 Vomiting without nausea: Secondary | ICD-10-CM

## 2021-03-19 DIAGNOSIS — Z20822 Contact with and (suspected) exposure to covid-19: Secondary | ICD-10-CM | POA: Diagnosis present

## 2021-03-19 DIAGNOSIS — E119 Type 2 diabetes mellitus without complications: Secondary | ICD-10-CM | POA: Diagnosis present

## 2021-03-19 DIAGNOSIS — Z885 Allergy status to narcotic agent status: Secondary | ICD-10-CM | POA: Diagnosis not present

## 2021-03-19 DIAGNOSIS — R6510 Systemic inflammatory response syndrome (SIRS) of non-infectious origin without acute organ dysfunction: Secondary | ICD-10-CM

## 2021-03-19 DIAGNOSIS — Z91041 Radiographic dye allergy status: Secondary | ICD-10-CM | POA: Diagnosis not present

## 2021-03-19 DIAGNOSIS — E662 Morbid (severe) obesity with alveolar hypoventilation: Secondary | ICD-10-CM | POA: Diagnosis present

## 2021-03-19 DIAGNOSIS — R55 Syncope and collapse: Secondary | ICD-10-CM | POA: Diagnosis present

## 2021-03-19 DIAGNOSIS — I119 Hypertensive heart disease without heart failure: Secondary | ICD-10-CM | POA: Diagnosis present

## 2021-03-19 DIAGNOSIS — E876 Hypokalemia: Secondary | ICD-10-CM | POA: Diagnosis present

## 2021-03-19 DIAGNOSIS — Z888 Allergy status to other drugs, medicaments and biological substances status: Secondary | ICD-10-CM | POA: Diagnosis not present

## 2021-03-19 DIAGNOSIS — J9621 Acute and chronic respiratory failure with hypoxia: Secondary | ICD-10-CM | POA: Diagnosis present

## 2021-03-19 DIAGNOSIS — Z79899 Other long term (current) drug therapy: Secondary | ICD-10-CM | POA: Diagnosis not present

## 2021-03-19 DIAGNOSIS — B9729 Other coronavirus as the cause of diseases classified elsewhere: Secondary | ICD-10-CM | POA: Diagnosis present

## 2021-03-19 DIAGNOSIS — Z833 Family history of diabetes mellitus: Secondary | ICD-10-CM | POA: Diagnosis not present

## 2021-03-19 DIAGNOSIS — R001 Bradycardia, unspecified: Secondary | ICD-10-CM | POA: Diagnosis not present

## 2021-03-19 DIAGNOSIS — Z91018 Allergy to other foods: Secondary | ICD-10-CM | POA: Diagnosis not present

## 2021-03-19 DIAGNOSIS — J4551 Severe persistent asthma with (acute) exacerbation: Secondary | ICD-10-CM | POA: Diagnosis present

## 2021-03-19 DIAGNOSIS — Z86711 Personal history of pulmonary embolism: Secondary | ICD-10-CM | POA: Diagnosis not present

## 2021-03-19 DIAGNOSIS — R1110 Vomiting, unspecified: Secondary | ICD-10-CM

## 2021-03-19 DIAGNOSIS — Z8616 Personal history of COVID-19: Secondary | ICD-10-CM | POA: Diagnosis not present

## 2021-03-19 DIAGNOSIS — F319 Bipolar disorder, unspecified: Secondary | ICD-10-CM | POA: Diagnosis present

## 2021-03-19 DIAGNOSIS — Z825 Family history of asthma and other chronic lower respiratory diseases: Secondary | ICD-10-CM | POA: Diagnosis not present

## 2021-03-19 DIAGNOSIS — R072 Precordial pain: Secondary | ICD-10-CM | POA: Diagnosis present

## 2021-03-19 DIAGNOSIS — R079 Chest pain, unspecified: Secondary | ICD-10-CM

## 2021-03-19 DIAGNOSIS — J45901 Unspecified asthma with (acute) exacerbation: Secondary | ICD-10-CM | POA: Diagnosis not present

## 2021-03-19 DIAGNOSIS — D649 Anemia, unspecified: Secondary | ICD-10-CM | POA: Diagnosis present

## 2021-03-19 LAB — URINALYSIS, ROUTINE W REFLEX MICROSCOPIC
Bilirubin Urine: NEGATIVE
Glucose, UA: 50 mg/dL — AB
Ketones, ur: 5 mg/dL — AB
Leukocytes,Ua: NEGATIVE
Nitrite: NEGATIVE
Protein, ur: NEGATIVE mg/dL
Specific Gravity, Urine: 1.028 (ref 1.005–1.030)
pH: 5 (ref 5.0–8.0)

## 2021-03-19 LAB — CBC WITH DIFFERENTIAL/PLATELET
Abs Immature Granulocytes: 0.05 10*3/uL (ref 0.00–0.07)
Basophils Absolute: 0 10*3/uL (ref 0.0–0.1)
Basophils Relative: 0 %
Eosinophils Absolute: 0 10*3/uL (ref 0.0–0.5)
Eosinophils Relative: 0 %
HCT: 36.3 % (ref 36.0–46.0)
Hemoglobin: 10.6 g/dL — ABNORMAL LOW (ref 12.0–15.0)
Immature Granulocytes: 1 %
Lymphocytes Relative: 8 %
Lymphs Abs: 0.6 10*3/uL — ABNORMAL LOW (ref 0.7–4.0)
MCH: 27 pg (ref 26.0–34.0)
MCHC: 29.2 g/dL — ABNORMAL LOW (ref 30.0–36.0)
MCV: 92.6 fL (ref 80.0–100.0)
Monocytes Absolute: 0.1 10*3/uL (ref 0.1–1.0)
Monocytes Relative: 1 %
Neutro Abs: 6.7 10*3/uL (ref 1.7–7.7)
Neutrophils Relative %: 90 %
Platelets: 410 10*3/uL — ABNORMAL HIGH (ref 150–400)
RBC: 3.92 MIL/uL (ref 3.87–5.11)
RDW: 15.7 % — ABNORMAL HIGH (ref 11.5–15.5)
WBC: 7.4 10*3/uL (ref 4.0–10.5)
nRBC: 0 % (ref 0.0–0.2)

## 2021-03-19 LAB — RESPIRATORY PANEL BY PCR

## 2021-03-19 LAB — COMPREHENSIVE METABOLIC PANEL
ALT: 23 U/L (ref 0–44)
AST: 27 U/L (ref 15–41)
Albumin: 3.4 g/dL — ABNORMAL LOW (ref 3.5–5.0)
Alkaline Phosphatase: 57 U/L (ref 38–126)
Anion gap: 11 (ref 5–15)
BUN: 7 mg/dL (ref 6–20)
CO2: 23 mmol/L (ref 22–32)
Calcium: 8.8 mg/dL — ABNORMAL LOW (ref 8.9–10.3)
Chloride: 111 mmol/L (ref 98–111)
Creatinine, Ser: 0.68 mg/dL (ref 0.44–1.00)
GFR, Estimated: >60 mL/min (ref >60)
Glucose, Bld: 227 mg/dL — ABNORMAL HIGH (ref 70–99)
Potassium: 3.4 mmol/L — ABNORMAL LOW (ref 3.5–5.1)
Sodium: 145 mmol/L (ref 135–145)
Total Bilirubin: 0.2 mg/dL — ABNORMAL LOW (ref 0.3–1.2)
Total Protein: 7.2 g/dL (ref 6.5–8.1)

## 2021-03-19 LAB — PREGNANCY, URINE: Preg Test, Ur: NEGATIVE

## 2021-03-19 LAB — PROCALCITONIN: Procalcitonin: <0.1 ng/mL

## 2021-03-19 LAB — TROPONIN I (HIGH SENSITIVITY): Troponin I (High Sensitivity): 2 ng/L (ref <18)

## 2021-03-19 LAB — PHOSPHORUS: Phosphorus: 2.5 mg/dL (ref 2.5–4.6)

## 2021-03-19 LAB — MAGNESIUM
Magnesium: 2.2 mg/dL (ref 1.7–2.4)
Magnesium: 2.2 mg/dL (ref 1.7–2.4)

## 2021-03-19 LAB — HIV ANTIBODY (ROUTINE TESTING W REFLEX): HIV Screen 4th Generation wRfx: NONREACTIVE

## 2021-03-19 LAB — D-DIMER, QUANTITATIVE: D-Dimer, Quant: 0.39 ug/mL-FEU (ref 0.00–0.50)

## 2021-03-19 LAB — POTASSIUM: Potassium: 3.4 mmol/L — ABNORMAL LOW (ref 3.5–5.1)

## 2021-03-19 MED ORDER — ALBUTEROL SULFATE (2.5 MG/3ML) 0.083% IN NEBU: 2.5000 mg | INHALATION_SOLUTION | RESPIRATORY_TRACT | Status: DC | PRN

## 2021-03-19 MED ORDER — LURASIDONE HCL 20 MG PO TABS
20.0000 mg | ORAL_TABLET | Freq: Every day | ORAL | Status: DC
  Administered 2021-03-19 – 2021-03-21 (×3): 20 mg via ORAL
  Filled 2021-03-19 (×3): qty 1

## 2021-03-19 MED ORDER — ALPRAZOLAM 0.5 MG PO TABS
0.5000 mg | ORAL_TABLET | Freq: Two times a day (BID) | ORAL | Status: DC
  Administered 2021-03-19 – 2021-03-21 (×2): 0.5 mg via ORAL
  Filled 2021-03-19 (×3): qty 1

## 2021-03-19 MED ORDER — ARIPIPRAZOLE 2 MG PO TABS
2.0000 mg | ORAL_TABLET | Freq: Every day | ORAL | Status: DC
  Administered 2021-03-19 – 2021-03-21 (×3): 2 mg via ORAL
  Filled 2021-03-19 (×3): qty 1

## 2021-03-19 MED ORDER — FERROUS SULFATE 325 (65 FE) MG PO TABS
325.0000 mg | ORAL_TABLET | Freq: Two times a day (BID) | ORAL | Status: DC
  Administered 2021-03-20 – 2021-03-21 (×4): 325 mg via ORAL
  Filled 2021-03-19 (×4): qty 1

## 2021-03-19 MED ORDER — CYCLOBENZAPRINE HCL 10 MG PO TABS: 10.0000 mg | ORAL_TABLET | Freq: Three times a day (TID) | ORAL | Status: DC | PRN

## 2021-03-19 MED ORDER — OXYCODONE-ACETAMINOPHEN 10-325 MG PO TABS: 1.0000 | ORAL_TABLET | Freq: Three times a day (TID) | ORAL | Status: DC | PRN

## 2021-03-19 MED ORDER — METHYLPREDNISOLONE SODIUM SUCC 125 MG IJ SOLR
60.0000 mg | Freq: Four times a day (QID) | INTRAMUSCULAR | Status: DC
  Administered 2021-03-19 – 2021-03-21 (×11): 60 mg via INTRAVENOUS
  Filled 2021-03-19 (×11): qty 2

## 2021-03-19 MED ORDER — ACETAMINOPHEN 650 MG RE SUPP: 650.0000 mg | Freq: Four times a day (QID) | RECTAL | Status: DC | PRN

## 2021-03-19 MED ORDER — ENOXAPARIN SODIUM 40 MG/0.4ML ~~LOC~~ SOLN
40.0000 mg | SUBCUTANEOUS | Status: DC
  Administered 2021-03-19 – 2021-03-21 (×3): 40 mg via SUBCUTANEOUS
  Filled 2021-03-19 (×3): qty 0.4

## 2021-03-19 MED ORDER — POTASSIUM CHLORIDE CRYS ER 20 MEQ PO TBCR
40.0000 meq | EXTENDED_RELEASE_TABLET | Freq: Once | ORAL | Status: DC
  Filled 2021-03-19: qty 2

## 2021-03-19 MED ORDER — TRAZODONE HCL 100 MG PO TABS
200.0000 mg | ORAL_TABLET | Freq: Every day | ORAL | Status: DC
  Administered 2021-03-19: 200 mg via ORAL
  Filled 2021-03-19 (×2): qty 2

## 2021-03-19 MED ORDER — BREXPIPRAZOLE 2 MG PO TABS
2.0000 mg | ORAL_TABLET | Freq: Every day | ORAL | Status: DC
  Administered 2021-03-19 – 2021-03-21 (×3): 2 mg via ORAL
  Filled 2021-03-19 (×3): qty 1

## 2021-03-19 MED ORDER — MOMETASONE FURO-FORMOTEROL FUM 200-5 MCG/ACT IN AERO
2.0000 | INHALATION_SPRAY | Freq: Two times a day (BID) | RESPIRATORY_TRACT | Status: DC
  Administered 2021-03-19 – 2021-03-21 (×4): 2 via RESPIRATORY_TRACT
  Filled 2021-03-19: qty 8.8

## 2021-03-19 MED ORDER — IPRATROPIUM-ALBUTEROL 0.5-2.5 (3) MG/3ML IN SOLN
3.0000 mL | Freq: Three times a day (TID) | RESPIRATORY_TRACT | Status: DC
  Administered 2021-03-19 – 2021-03-21 (×8): 3 mL via RESPIRATORY_TRACT
  Filled 2021-03-19 (×8): qty 3

## 2021-03-19 MED ORDER — SERTRALINE HCL 50 MG PO TABS
50.0000 mg | ORAL_TABLET | Freq: Every day | ORAL | Status: DC
  Administered 2021-03-19 – 2021-03-21 (×3): 50 mg via ORAL
  Filled 2021-03-19 (×3): qty 1

## 2021-03-19 MED ORDER — POTASSIUM CHLORIDE CRYS ER 20 MEQ PO TBCR
40.0000 meq | EXTENDED_RELEASE_TABLET | Freq: Once | ORAL | Status: AC
  Administered 2021-03-19: 40 meq via ORAL
  Filled 2021-03-19: qty 2

## 2021-03-19 MED ORDER — IPRATROPIUM-ALBUTEROL 0.5-2.5 (3) MG/3ML IN SOLN
3.0000 mL | Freq: Four times a day (QID) | RESPIRATORY_TRACT | Status: DC
  Administered 2021-03-19: 3 mL via RESPIRATORY_TRACT
  Filled 2021-03-19: qty 3

## 2021-03-19 MED ORDER — BUSPIRONE HCL 5 MG PO TABS
7.5000 mg | ORAL_TABLET | Freq: Two times a day (BID) | ORAL | Status: DC
  Administered 2021-03-19 – 2021-03-21 (×4): 7.5 mg via ORAL
  Filled 2021-03-19 (×4): qty 2

## 2021-03-19 MED ORDER — ONDANSETRON HCL 4 MG/2ML IJ SOLN
4.0000 mg | Freq: Four times a day (QID) | INTRAMUSCULAR | Status: DC | PRN
  Administered 2021-03-19 – 2021-03-21 (×6): 4 mg via INTRAVENOUS
  Filled 2021-03-19 (×7): qty 2

## 2021-03-19 MED ORDER — FENTANYL CITRATE (PF) 100 MCG/2ML IJ SOLN
50.0000 ug | Freq: Once | INTRAMUSCULAR | Status: AC
  Administered 2021-03-19: 50 ug via INTRAVENOUS
  Filled 2021-03-19: qty 2

## 2021-03-19 MED ORDER — ADULT MULTIVITAMIN W/MINERALS CH
1.0000 | ORAL_TABLET | Freq: Every day | ORAL | Status: DC
  Administered 2021-03-20 – 2021-03-21 (×2): 1 via ORAL
  Filled 2021-03-19 (×3): qty 1

## 2021-03-19 MED ORDER — SUCRALFATE 1 GM/10ML PO SUSP
1.0000 g | Freq: Two times a day (BID) | ORAL | Status: DC
  Administered 2021-03-19 – 2021-03-21 (×5): 1 g via ORAL
  Filled 2021-03-19 (×5): qty 10

## 2021-03-19 MED ORDER — SODIUM CHLORIDE 0.9 % IV SOLN: INTRAVENOUS | Status: AC

## 2021-03-19 MED ORDER — ACETAMINOPHEN 325 MG PO TABS
650.0000 mg | ORAL_TABLET | Freq: Four times a day (QID) | ORAL | Status: DC | PRN
  Filled 2021-03-19: qty 2

## 2021-03-19 MED ORDER — OXYCODONE HCL 5 MG PO TABS
5.0000 mg | ORAL_TABLET | Freq: Three times a day (TID) | ORAL | Status: DC | PRN
  Administered 2021-03-19: 5 mg via ORAL
  Filled 2021-03-19: qty 1

## 2021-03-19 MED ORDER — OXYCODONE-ACETAMINOPHEN 5-325 MG PO TABS
1.0000 | ORAL_TABLET | Freq: Three times a day (TID) | ORAL | Status: DC | PRN
Start: 2021-03-19 — End: 2021-03-20

## 2021-03-19 MED ORDER — GUAIFENESIN ER 600 MG PO TB12
1200.0000 mg | ORAL_TABLET | Freq: Two times a day (BID) | ORAL | Status: DC
  Administered 2021-03-19 – 2021-03-21 (×5): 1200 mg via ORAL
  Filled 2021-03-19 (×5): qty 2

## 2021-03-19 MED ORDER — ASPIRIN EC 81 MG PO TBEC
81.0000 mg | DELAYED_RELEASE_TABLET | Freq: Every day | ORAL | Status: DC
  Administered 2021-03-19 – 2021-03-21 (×3): 81 mg via ORAL
  Filled 2021-03-19 (×3): qty 1

## 2021-03-19 MED ORDER — GABAPENTIN 300 MG PO CAPS
300.0000 mg | ORAL_CAPSULE | Freq: Three times a day (TID) | ORAL | Status: DC
  Administered 2021-03-19 – 2021-03-21 (×6): 300 mg via ORAL
  Filled 2021-03-19 (×6): qty 1

## 2021-03-19 NOTE — H&P (Signed)
History and Physical    Felicia GoodnessShareka M Tolle UEA:[id number] DOB: 10-03-1991 DOA: 03/18/2021  PCP: [facility], [facility]  Chief Complaint: Shortness of breath  HPI: Felicia Bradley is a 30 y.o. female with medical history significant of asthma, type 2 diabetes, hypertension, morbid obesity (BMI 49.6), history of PE presented to the ED with complaints of shortness of breath, cough, fevers, and vomiting.  In the ED, she was tachypneic with accessory muscle use and tachycardic.  Febrile.  Not hypoxic.  Labs showing WBC 7.8, hemoglobin 10.6 (stable), platelet count 387K.  Sodium 141, potassium 3.3, chloride 106, bicarb 26, BUN 6, creatinine 0.6, glucose 113.  No elevation of LFTs.  UA and urine culture pending.  Urine pregnancy test pending.  Covid and influenza PCR negative.  Lactic acid 2.0 >1.1.  INR 1.0.  Blood culture x2 pending.  Group A strep PCR negative.  High-sensitivity troponin negative x2.  EKG without acute ischemic changes.  Chest x-ray not suggestive of pneumonia. She was given Tylenol, albuterol and ipratropium inhaler treatments, fentanyl, IV Solu-Medrol 125, Zofran, IV magnesium 2 g.  Also given continuous albuterol neb treatment.  Patient states she is not feeling well for the past 3 days.  She is wheezing and coughing.  She is feeling short of breath.  She is also having sharp substernal chest pain for the past 3 days.  Reports history of PE in 2016.  Denies calf pain or swelling.  She is also having fevers at home and vomiting.  Denies abdominal pain, diarrhea, or dysuria.  States she was previously on a medication for high blood pressure which was stopped as her blood pressure started dropping.  States her diabetes is due to steroid use for asthma and she does not take any medications.  Review of Systems:  All systems reviewed and apart from history of presenting illness, are negative.  Past Medical History:  Diagnosis Date  . Asthma   . Diabetes mellitus  without complication (HCC)    type 2  . Hypertension   . Obesity   . Pulmonary embolism [facility](HCC)     Past Surgical History:  Procedure Laterality Date  . CESAREAN SECTION    . NO PAST SURGERIES       reports that she has never smoked. She has never used smokeless tobacco. She reports that she does not drink alcohol and does not use drugs.  Allergies  Allergen Reactions  . Contrast Media [Iodinated Diagnostic Agents] Anaphylaxis  . Dilaudid [Hydromorphone] Anaphylaxis  . Tramadol Itching  . Apple Hives  . Banana Hives  . Dexamethasone Nausea And Vomiting and Hives    "feels like my pubic hair is on fire"  . Ketorolac Itching  . Other Hives    Zucchini and squash  . Solu-Medrol [Methylprednisolone] Nausea And Vomiting  . Toradol [Ketorolac Tromethamine] Hives    Family History  Problem Relation Age of Onset  . Diabetes Mother   . Asthma Mother   . Asthma Maternal Aunt   . Diabetes Maternal Aunt     Prior to Admission medications   Medication Sig Start Date End Date Taking? Authorizing Provider  acetaminophen (TYLENOL) 325 MG tablet Take 325 mg by mouth every 6 (six) hours as needed for moderate pain.    [provider]  albuterol (VENTOLIN HFA) 108 (90 Base) MCG/ACT inhaler Inhale 1 puff into the lungs 2 (two) times daily as needed for wheezing or shortness of breath.  11/10/14   [provider]  diclofenac Sodium (VOLTAREN) 1 % GEL Apply 2 g topically 4 (four) times daily. 06/28/20   Hall-Potvin, [place], PA-C  EPINEPHrine 0.3 mg/0.3 mL IJ SOAJ injection Inject 0.3 mg into the muscle as needed for anaphylaxis.  11/10/14   [provider]  ferrous sulfate 325 (65 FE) MG EC tablet Take 325 mg by mouth daily. 11/10/14   [provider]  ipratropium-albuterol (DUONEB) 0.5-2.5 (3) MG/3ML SOLN Take 3 mLs by nebulization 3 (three) times daily as needed (shortness of breath).  07/19/19   [provider]  methocarbamol (ROBAXIN) 500 MG  tablet Take 1 tablet (500 mg total) by mouth 2 (two) times daily. 10/26/20   Garlon Hatchet, PA-C  Multiple Vitamins-Minerals (MULTIVITAMIN ADULT PO) Take 1 tablet by mouth daily.    [provider]  ondansetron (ZOFRAN ODT) 4 MG disintegrating tablet Take 1 tablet (4 mg total) by mouth every 8 (eight) hours as needed for nausea or vomiting. 07/08/20   Liberty Handy, PA-C    Physical Exam: Vitals:   03/18/21 2300 03/18/21 2315 03/19/21 0052 03/19/21 0301  BP: (!) 148/114 (!) 145/115 (!) 129/93 128/87  Pulse: 100 (!) 102 (!) 101 92  Resp: 20 (!) 26  18  Temp:   98.1 F (36.7 C) 98.2 F (36.8 C)  TempSrc:   Oral Oral  SpO2: 92% 97% 95% 93%  Weight:      Height:        Physical Exam Constitutional:      General: She is not in acute distress. HENT:     Head: Normocephalic and atraumatic.  Eyes:     Extraocular Movements: Extraocular movements intact.     Conjunctiva/sclera: Conjunctivae normal.  Cardiovascular:     Rate and Rhythm: Normal rate and regular rhythm.     Pulses: Normal pulses.  Pulmonary:     Effort: Pulmonary effort is normal. No respiratory distress.     Breath sounds: Wheezing present. No rales.  Abdominal:     General: Bowel sounds are normal. There is no distension.     Palpations: Abdomen is soft.     Tenderness: There is no abdominal tenderness. There is no guarding or rebound.  Musculoskeletal:        General: No swelling or tenderness.     Cervical back: Normal range of motion and neck supple. No rigidity.  Skin:    General: Skin is warm and dry.  Neurological:     General: No focal deficit present.     Mental Status: She is alert and oriented to person, place, and time.     Labs on Admission: I have personally reviewed following labs and imaging studies  CBC: Recent Labs  Lab 03/18/21 2020  WBC 7.8  NEUTROABS 4.4  HGB 10.6*  HCT 34.5*  MCV 88.9  PLT 387   Basic Metabolic Panel: Recent Labs  Lab 03/18/21 2020  NA 141   K 3.3*  CL 106  CO2 26  GLUCOSE 113*  BUN 6  CREATININE 0.63  CALCIUM 8.6*   GFR: Estimated Creatinine Clearance: 133.4 mL/min (by C-G formula based on SCr of 0.63 mg/dL). Liver Function Tests: Recent Labs  Lab 03/18/21 2020  AST 24  ALT 21  ALKPHOS 59  BILITOT 0.1*  PROT 6.9  ALBUMIN 3.1*   No results for input(s): LIPASE, AMYLASE in the last 168 hours. No results for input(s): AMMONIA in the last 168 hours. Coagulation Profile: Recent Labs  Lab 03/18/21 2020  INR  1.0   Cardiac Enzymes: No results for input(s): CKTOTAL, CKMB, CKMBINDEX, TROPONINI in the last 168 hours. BNP (last 3 results) No results for input(s): PROBNP in the last 8760 hours. HbA1C: No results for input(s): HGBA1C in the last 72 hours. CBG: No results for input(s): GLUCAP in the last 168 hours. Lipid Profile: No results for input(s): CHOL, HDL, LDLCALC, TRIG, CHOLHDL, LDLDIRECT in the last 72 hours. Thyroid Function Tests: No results for input(s): TSH, T4TOTAL, FREET4, T3FREE, THYROIDAB in the last 72 hours. Anemia Panel: No results for input(s): VITAMINB12, FOLATE, FERRITIN, TIBC, IRON, RETICCTPCT in the last 72 hours. Urine analysis:    Component Value Date/Time   COLORURINE YELLOW 03/01/2020 0548   APPEARANCEUR CLOUDY (A) 03/01/2020 0548   LABSPEC 1.024 03/01/2020 0548   PHURINE 6.0 03/01/2020 0548   GLUCOSEU NEGATIVE 03/01/2020 0548   HGBUR SMALL (A) 03/01/2020 0548   BILIRUBINUR NEGATIVE 03/01/2020 0548   KETONESUR NEGATIVE 03/01/2020 0548   PROTEINUR NEGATIVE 03/01/2020 0548   UROBILINOGEN 0.2 12/04/2009 1301   NITRITE NEGATIVE 03/01/2020 0548   LEUKOCYTESUR LARGE (A) 03/01/2020 0548    Radiological Exams on Admission: DG Chest Port 1 View  Result Date: 03/18/2021 CLINICAL DATA:  Asthma, shortness of breath, vomiting, and cough. EXAM: PORTABLE CHEST 1 VIEW COMPARISON:  03/07/2021 FINDINGS: Shallow inspiration. Atelectasis in the lung bases. Heart size and pulmonary vascularity  are likely normal for technique. No apparent pleural effusion or pneumothorax. Mediastinal contours appear intact. Similar appearance to prior study. IMPRESSION: Shallow inspiration with atelectasis in the lung bases. Electronically Signed   By: Burman Nieves M.D.   On: 03/18/2021 21:33    EKG: Independently reviewed.  Sinus tachycardia, no acute ischemic changes.  Assessment/Plan Principal Problem:   Asthma exacerbation Active Problems:   SIRS (systemic inflammatory response syndrome) (HCC)   Chest pain   Emesis   Hypokalemia   Acute asthma exacerbation Work of breathing improved after IV steroid, magnesium, and bronchodilator treatments given in the ED.  At present, still wheezing but not hypoxic. -Continue Solu-Medrol 60 mg every 6 hours, DuoNeb every 6 hours, albuterol nebulizer as needed.  Continuous pulse ox, supplemental oxygen as needed.  SIRS Tachycardic and tachypneic in the ED in the setting of asthma exacerbation.  Febrile with temperature 100.8 F.  Chest x-ray not suggestive of pneumonia.  Mild lactic acidosis resolved without IV fluids.  No leukocytosis on labs.  Covid and influenza PCR negative.  Group A strep PCR negative.  No headaches or meningeal signs. -UA and urine culture pending.  Check procalcitonin level.  Chest pain Endorsing sharp substernal chest pain.  Work-up not suggestive of ACS-high-sensitivity troponin negative x2 and EKG without acute ischemic changes. -She does have a prior history of PE but no clinical signs of DVT on exam.  Unable to order CT angiogram due to contrast allergy (anaphylaxis).  Check D-dimer level, if elevated start heparin for anticoagulation and obtain VQ scan in the morning.  Emesis LFTs normal.  No complaints of abdominal pain or diarrhea.  Abdominal exam benign.  Covid and influenza PCR negative.  No active emesis at this time. -IV fluid hydration, antiemetic as needed.  Urine pregnancy test pending.  UA and urine culture  pending.   Mild hypokalemia -Monitor potassium and magnesium levels, replenish as needed.  Type 2 diabetes Not on any medications. -Check A1c.  Sliding scale insulin ACHS as she is being treated with steroids.  Hypertension Blood pressure stable.  Not on any antihypertensives at home. -Continue to  monitor closely  DVT prophylaxis: Lovenox Code Status: Full code Family Communication: No family available at this time. Disposition Plan: Status is: Observation  The patient remains OBS appropriate and will d/c before 2 midnights.  Dispo: The patient is from: Home              Anticipated d/c is to: Home              Patient currently is not medically stable to d/c.   Difficult to place patient No  Level of care: Level of care: Telemetry   The medical decision making on this patient was of high complexity and the patient is at high risk for clinical deterioration, therefore this is a level 3 visit.  John Giovanni MD [facility]  If 7PM-7AM, please contact night-coverage [email or web address]  03/19/2021, 3:14 AM

## 2021-03-19 NOTE — Progress Notes (Signed)
Pt slow to arouse at times. Pt states, "she has sleep apnea, has had sleep study and fitted for CPAP; scheduled to pick up CPAP machine, prescription expired, call MD to renew order outpatient and waiting for a response (per patient)". Encouraged pt to deep breath and use flutter. SRP, RN

## 2021-03-19 NOTE — Progress Notes (Signed)
Component Ref Range & Units 10:12 1 yr ago  Adenovirus NOT DETECTED NOT DETECTED  NOT DETECTED   Coronavirus 229E NOT DETECTED NOT DETECTED  NOT DETECTED CM   Comment: (NOTE)  The Coronavirus on the Respiratory Panel, DOES NOT test for the novel  Coronavirus (2019 nCoV)   Coronavirus HKU1 NOT DETECTED NOT DETECTED  NOT DETECTED   Coronavirus NL63 NOT DETECTED NOT DETECTED  NOT DETECTED   Coronavirus OC43 NOT DETECTED DETECTEDAbnormal  NOT DETECTED   Metapneumovirus NOT DETECTED NOT DETECTED  NOT DETECTED   Rhinovirus / Enterovirus NOT DETECTED NOT DETECTED  NOT DETECTED   Influenza A NOT DETECTED NOT DETECTED  NOT DETECTED   Influenza B NOT DETECTED NOT DETECTED  NOT DETECTED   Parainfluenza Virus 1 NOT DETECTED NOT DETECTED  NOT DETECTED   Parainfluenza Virus 2 NOT DETECTED NOT DETECTED  NOT DETECTED   Parainfluenza Virus 3 NOT DETECTED NOT DETECTED  NOT DETECTED   Parainfluenza Virus 4 NOT DETECTED NOT DETECTED  NOT DETECTED   Respiratory Syncytial Virus NOT DETECTED NOT DETECTED  NOT DETECTED   Bordetella pertussis NOT DETECTED NOT DETECTED  NOT DETECTED   Bordetella Parapertussis NOT DETECTED NOT DETECTED    Chlamydophila pneumoniae NOT DETECTED NOT DETECTED  NOT DETECTED   Mycoplasma pneumoniae NOT DETECTED NOT DETECTED  NOT DETECTED CM   Comment: Performed at [facility], [address], [place], [place] [address]  Resulting Agency  [facility]   MD made aware of findings, called IP  RN Joni Reining to obtain recommendations. Will continue standard Precautions. SRP, RN

## 2021-03-19 NOTE — Progress Notes (Signed)
Nutrition Brief Note  Patient identified on the Malnutrition Screening Tool (MST) Report Pt reports that she had gained some weight at the beginning of the year but has lost most of that now.  Pt is interested in lap band surgery and is talking with her PCP about these options. Pt expressed her desire to be healthy and not need medications for HTN and DM.  We discussed mindful eating practices and activity to lead healthy lifestyle and to manage weight. All questions answered. Family at bedside.    Wt Readings from Last 15 Encounters:  03/19/21 (!) 139.3 kg  03/06/21 127 kg  10/26/20 (!) 139.7 kg  07/23/20 128.4 kg  07/07/20 (!) 128.4 kg  03/01/20 123.8 kg  01/28/20 (!) 139.7 kg  01/18/20 (!) 139.7 kg  01/14/20 (!) 140 kg  01/02/20 123 kg  10/25/19 123.8 kg  08/02/18 123.8 kg  09/05/17 127 kg  02/17/16 132 kg    Body mass index is 54.4 kg/m. Patient meets criteria for morbid obesity based on current BMI.   Current diet order is Regular, patient is consuming approximately 100% of meals at this time. Labs and medications reviewed.   No nutrition interventions warranted at this time. If nutrition issues arise, please consult RD.   Cammy Copa., RD, LDN, CNSC See AMiON for contact information

## 2021-03-19 NOTE — Progress Notes (Signed)
Care started prior to midnight in the emergency room and patient was admitted early this morning after midnight by Dr. John Giovanni and I am in current agreement with her assessment and plan.  Additional changes to the plan of care have been made accordingly.  The patient is a super morbidly obese African-American female with a past medical history significant for but not limited to asthma, type 2 diabetes mellitus, hypertension, history of PE as well as other comorbidities who presented to the ED with chief complaint of shortness of breath, cough, fevers and vomiting.  She was noted to be tachypneic with accessory muscle use and tachycardic in the ED.  She was febrile at 100.8 but not hypoxic.  Labs showed a WBC of 7.8 hemoglobin 10.6 and platelet count of 387,000.  Her metabolic panel did show that she had a slight hypokalemia.  Covid and influenza testing was negative.  Initial lactic acid was 2.0 but then improved to 1.1.  Blood cultures were obtained and were pending.  Urinalysis and urine culture also obtained and pending.  Chest x-ray was not suggestive of pneumonia.  She is given acetaminophen, albuterol and ipratropium inhaler treatments, fentanyl and IV Solu-Medrol 125 mg as well as ondansetron and IV magnesium 2 mg.  She is also given a continuous albuterol neb treatment.  She noted that she is not feeling well for last 3 days and was wheezing and coughing and feeling short of breath.  She also described having sharp substernal chest pain for last 3 days.  At home she was found to have fevers and vomiting but denies any abdominal pain, diarrhea or dysuria.  She is previously on medication for high blood pressure which was stopped due to blood pressure dropping.  She noted that her diabetes was secondary to steroid use for her asthma and she does not take any medications at home.  Currently she is being admitted and treated for the following but not limited to:  Acute asthma exacerbation with  Acute on Chronic Hypoxic Respiratory Failure  -Work of breathing improved after IV steroid, magnesium, and bronchodilator treatments given in the ED. -At present, still wheezing mildly  -SpO2: 99 % O2 Flow Rate (L/min): 2 L/min -Continue Solu-Medrol 60 mg every 6 hours, DuoNeb every 6 hours changed to TID, albuterol nebulizer as needed. -Check Respiratory Virus Panel -C/w Guaifenesin 1200 mg, Flutter Valve, and Incentive Spirometry  -Continuous pulse ox, supplemental oxygen as needed. -Will need an Ambulatuory Home O2 Screen Prior to D/C and repeat CXR in the AM   SIRS -She was Tachycardic and tachypneic in the ED in the setting of asthma exacerbation.  -Febrile with temperature 100.8 F.  Chest x-ray not suggestive of pneumonia.   -Mild lactic acidosis resolved without IV fluids.   -No leukocytosis on labs and last WBC was 7.4.   -Covid and influenza PCR negative.  Group A strep PCR negative.   -No headaches or meningeal signs. -UA as below and urine culture pending.   -Checked procalcitonin level was <0.10  Chest pain -Endorsing sharp substernal chest pain.   -Work-up not suggestive of ACS-high-sensitivity troponin negative x2 and EKG without acute ischemic changes. -She does have a prior history of PE but no clinical signs of DVT on exam.   -Unable to order CT angiogram due to contrast allergy (anaphylaxis).   -Checked D-dimer level and was 0.39 but since it was not elevated will not start heparin for anticoagulation  -If Respiratory Status not improving may obtain VQ scan  in the morning and Pulmonary evaluation.  Emesis, improving -LFTs normal.  No complaints of abdominal pain or diarrhea.   -Abdominal exam benign.  Covid and influenza PCR negative.  No active emesis at this time. -IV fluid hydration with NS at 125 mL/hr -C/w Antiemetic with Ondansetron 4 mg IV q6h as needed.   -Urine pregnancy test pending.   -UA showed a hazy appearance with 50 glucose, small hemoglobin, 5  ketones, rare bacteria, 6-10 RBCs per high-power field, 11-20 WBCs and urine culture pending.   Hypokalemia -Patient's K+ was 3.4 -Replete with po KCl 40 mEQ x2 -Monitor potassium and magnesium levels, replenish as needed. -Repeat CMP in the AM   Type 2 Diabetes Mellitus  -Not on any medications. -Check A1c.   -Sliding scale insulin ACHS as she is being treated with steroids.  Hypertension -Blood pressure stable.   -Not on any antihypertensives at home. -Continue to monitor BP per Protocol  -Last BP was 135/112  Normocytic Anemia -Patient's Hgb/Hct on Admission was 10.6/34.5 -> 10.6/36.3 -Check Anemia Panel -Continue to Monitor for S/Sx of bleeding; currently no overt bleeding noted   Super Morbid Obesity -Complicates overall prognosis and care -Estimated body mass index is 54.4 kg/m as calculated from the following:   Height as of this encounter: 5\' 3"  (1.6 m).   Weight as of this encounter: 139.3 kg. -Weight Loss and Dietary Counseling given   We will continue to monitor the patient's clinical response to intervention and repeat blood work and imaging in the a.m. and if necessary will consult pulmonary for further evaluation recommendations and follow-up on her respiratory virus panel

## 2021-03-19 NOTE — Progress Notes (Signed)
Received phone call from Tama Gander, RN regarding patient's respiratory viral panel results. Guidance for this patient's results is standard precautions. Guidance provided to RN verbally over phone.

## 2021-03-19 NOTE — Plan of Care (Signed)
  Problem: Education: Goal: Knowledge of General Education information will improve Description Including pain rating scale, medication(s)/side effects and non-pharmacologic comfort measures Outcome: Progressing   

## 2021-03-19 NOTE — Progress Notes (Signed)
   03/19/21 0052  Assess: MEWS Score  Temp 98.1 F (36.7 C)  BP (!) 129/93  Pulse Rate (!) 101  SpO2 95 %  O2 Device Room Air  Assess: MEWS Score  MEWS Temp 0  MEWS Systolic 0  MEWS Pulse 1  MEWS RR 2  MEWS LOC 0  MEWS Score 3  MEWS Score Color Yellow  Assess: if the MEWS score is Yellow or Red  Were vital signs taken at a resting state? Yes  Focused Assessment No change from prior assessment  Early Detection of Sepsis Score *See Row Information* High  MEWS guidelines implemented *See Row Information* Yes  Take Vital Signs  Increase Vital Sign Frequency  Yellow: Q 2hr X 2 then Q 4hr X 2, if remains yellow, continue Q 4hrs  Escalate  MEWS: Escalate Yellow: discuss with charge nurse/RN and consider discussing with provider and RRT  Notify: Charge Nurse/RN  Name of Charge Nurse/RN Notified Rinaldo Cloud, RN  Date Charge Nurse/RN Notified 03/19/21  Time Charge Nurse/RN Notified [phone]

## 2021-03-20 ENCOUNTER — Inpatient Hospital Stay (HOSPITAL_COMMUNITY): Payer: Medicaid Other

## 2021-03-20 ENCOUNTER — Encounter (HOSPITAL_COMMUNITY): Payer: Self-pay | Admitting: Internal Medicine

## 2021-03-20 DIAGNOSIS — J45901 Unspecified asthma with (acute) exacerbation: Secondary | ICD-10-CM | POA: Diagnosis not present

## 2021-03-20 DIAGNOSIS — R001 Bradycardia, unspecified: Secondary | ICD-10-CM | POA: Diagnosis not present

## 2021-03-20 DIAGNOSIS — J069 Acute upper respiratory infection, unspecified: Secondary | ICD-10-CM

## 2021-03-20 LAB — BLOOD GAS, ARTERIAL
Acid-Base Excess: 0.5 mmol/L (ref 0.0–2.0)
Bicarbonate: 26.2 mmol/L (ref 20.0–28.0)
FIO2: 40
O2 Saturation: 97.3 %
Patient temperature: 98.6
pCO2 arterial: 49.6 mmHg — ABNORMAL HIGH (ref 32.0–48.0)
pH, Arterial: 7.342 — ABNORMAL LOW (ref 7.350–7.450)
pO2, Arterial: 93.9 mmHg (ref 83.0–108.0)

## 2021-03-20 LAB — RAPID URINE DRUG SCREEN, HOSP PERFORMED
Amphetamines: NOT DETECTED
Barbiturates: NOT DETECTED
Benzodiazepines: POSITIVE — AB
Cocaine: NOT DETECTED
Opiates: NOT DETECTED
Tetrahydrocannabinol: NOT DETECTED

## 2021-03-20 LAB — URINE CULTURE

## 2021-03-20 LAB — CBC WITH DIFFERENTIAL/PLATELET
Abs Immature Granulocytes: 0.13 10*3/uL — ABNORMAL HIGH (ref 0.00–0.07)
Basophils Absolute: 0 10*3/uL (ref 0.0–0.1)
Basophils Relative: 0 %
Eosinophils Absolute: 0 10*3/uL (ref 0.0–0.5)
Eosinophils Relative: 0 %
HCT: 34.9 % — ABNORMAL LOW (ref 36.0–46.0)
Hemoglobin: 10.4 g/dL — ABNORMAL LOW (ref 12.0–15.0)
Immature Granulocytes: 1 %
Lymphocytes Relative: 11 %
Lymphs Abs: 1.6 10*3/uL (ref 0.7–4.0)
MCH: 27.8 pg (ref 26.0–34.0)
MCHC: 29.8 g/dL — ABNORMAL LOW (ref 30.0–36.0)
MCV: 93.3 fL (ref 80.0–100.0)
Monocytes Absolute: 0.2 10*3/uL (ref 0.1–1.0)
Monocytes Relative: 2 %
Neutro Abs: 12.2 10*3/uL — ABNORMAL HIGH (ref 1.7–7.7)
Neutrophils Relative %: 86 %
Platelets: 433 10*3/uL — ABNORMAL HIGH (ref 150–400)
RBC: 3.74 MIL/uL — ABNORMAL LOW (ref 3.87–5.11)
RDW: 15.7 % — ABNORMAL HIGH (ref 11.5–15.5)
WBC: 14.1 10*3/uL — ABNORMAL HIGH (ref 4.0–10.5)
nRBC: 0 % (ref 0.0–0.2)

## 2021-03-20 LAB — COMPREHENSIVE METABOLIC PANEL
ALT: 29 U/L (ref 0–44)
AST: 35 U/L (ref 15–41)
Albumin: 3.3 g/dL — ABNORMAL LOW (ref 3.5–5.0)
Alkaline Phosphatase: 58 U/L (ref 38–126)
Anion gap: 7 (ref 5–15)
BUN: 10 mg/dL (ref 6–20)
CO2: 23 mmol/L (ref 22–32)
Calcium: 8.8 mg/dL — ABNORMAL LOW (ref 8.9–10.3)
Chloride: 109 mmol/L (ref 98–111)
Creatinine, Ser: 0.64 mg/dL (ref 0.44–1.00)
GFR, Estimated: >60 mL/min (ref >60)
Glucose, Bld: 244 mg/dL — ABNORMAL HIGH (ref 70–99)
Potassium: 4.8 mmol/L (ref 3.5–5.1)
Sodium: 139 mmol/L (ref 135–145)
Total Bilirubin: 0.4 mg/dL (ref 0.3–1.2)
Total Protein: 7 g/dL (ref 6.5–8.1)

## 2021-03-20 LAB — TROPONIN I (HIGH SENSITIVITY)
Troponin I (High Sensitivity): 7 ng/L (ref <18)
Troponin I (High Sensitivity): 3 ng/L (ref <18)

## 2021-03-20 LAB — PHOSPHORUS: Phosphorus: 2 mg/dL — ABNORMAL LOW (ref 2.5–4.6)

## 2021-03-20 LAB — MAGNESIUM: Magnesium: 2.2 mg/dL (ref 1.7–2.4)

## 2021-03-20 LAB — GLUCOSE, CAPILLARY
Glucose-Capillary: 199 mg/dL — ABNORMAL HIGH (ref 70–99)
Glucose-Capillary: 273 mg/dL — ABNORMAL HIGH (ref 70–99)

## 2021-03-20 MED ORDER — NALOXONE HCL 0.4 MG/ML IJ SOLN
0.4000 mg | INTRAMUSCULAR | Status: DC | PRN
  Filled 2021-03-20: qty 1

## 2021-03-20 MED ORDER — POTASSIUM & SODIUM PHOSPHATES 280-160-250 MG PO PACK
1.0000 | PACK | Freq: Three times a day (TID) | ORAL | Status: DC
  Administered 2021-03-20 – 2021-03-21 (×5): 1 via ORAL
  Filled 2021-03-20 (×7): qty 1

## 2021-03-20 MED ORDER — INSULIN ASPART 100 UNIT/ML ~~LOC~~ SOLN
0.0000 [IU] | Freq: Three times a day (TID) | SUBCUTANEOUS | Status: DC
  Administered 2021-03-21: 5 [IU] via SUBCUTANEOUS
  Administered 2021-03-21: 3 [IU] via SUBCUTANEOUS

## 2021-03-20 MED ORDER — SODIUM CHLORIDE 0.9 % IV BOLUS
1000.0000 mL | Freq: Once | INTRAVENOUS | Status: AC
  Administered 2021-03-20: 1000 mL via INTRAVENOUS

## 2021-03-20 MED ORDER — AMMONIA AROMATIC IN INHA
1.0000 | Freq: Once | RESPIRATORY_TRACT | Status: DC
  Filled 2021-03-20: qty 10

## 2021-03-20 MED ORDER — INSULIN ASPART 100 UNIT/ML ~~LOC~~ SOLN
0.0000 [IU] | Freq: Every day | SUBCUTANEOUS | Status: DC
  Administered 2021-03-20: 3 [IU] via SUBCUTANEOUS

## 2021-03-20 NOTE — Progress Notes (Signed)
Plan of care reviewed with pt. Pt very drowsy this AM with HR dropping into the 30's and sustaining in the 40's. MD/RRT paged to make aware - YELLOW MUSE initiated. ABG collected/ Utox collect and NS bolus administered. Pt intermittently more alert throughout day, but taking frequent naps. CPAP worn while pt sleeping throughout day. Pt on 4 L [place] (baseline O2 req. 4-6 L [place] per pt) when not on CPAP and O2 saturation stable throughout shift. Cardiology consulted (see note)  And tele continued. VSS throughout shift.  Pt has no c/o pain throughout shift. Pt safety maintained. Will continue to monitor and reassess.

## 2021-03-20 NOTE — Progress Notes (Signed)
PROGRESS NOTE    Felicia Bradley  ZOX:[id number] DOB: May 10, 1991 DOA: 03/18/2021 PCP: [facility], [facility]  Brief Narrative: The patient is a super morbidly obese African-American female with a past medical history significant for but not limited to asthma, type 2 diabetes mellitus, hypertension, history of PE as well as other comorbidities who presented to the ED with chief complaint of shortness of breath, cough, fevers and vomiting.  She was noted to be tachypneic with accessory muscle use and tachycardic in the ED.  She was febrile at 100.8 but not hypoxic.  Labs showed a WBC of 7.8 hemoglobin 10.6 and platelet count of 387,000.  Her metabolic panel did show that she had a slight hypokalemia.  Covid and influenza testing was negative.  Initial lactic acid was 2.0 but then improved to 1.1.  Blood cultures were obtained and were pending.  Urinalysis and urine culture also obtained and pending.  Chest x-ray was not suggestive of pneumonia.  She is given acetaminophen, albuterol and ipratropium inhaler treatments, fentanyl and IV Solu-Medrol 125 mg as well as ondansetron and IV magnesium 2 mg.  She is also given a continuous albuterol neb treatment.  She noted that she is not feeling well for last 3 days and was wheezing and coughing and feeling short of breath.  She also described having sharp substernal chest pain for last 3 days.  At home she was found to have fevers and vomiting but denies any abdominal pain, diarrhea or dysuria.  She is previously on medication for high blood pressure which was stopped due to blood pressure dropping.  She noted that her diabetes was secondary to steroid use for her asthma and she does not take any medications at home  **Interim History Patient was very lethargic this morning and became bradycardic.  She is very difficult to arouse so rapid response was called on her around 7 AM this morning.  She is very slow to arouse so she is given a normal saline bolus and  obtain a UDS as well as an ABG on her.  She is going to be given smelling salts and given Narcan but she woke up prior to this.  Because she was bradycardic and complained of chest pain when she came to cardiology was consulted and they felt that this was asymptomatic nighttime bradycardia and recommended no additional work-up.  She continues to be lethargic throughout the day but wearing CPAP now.  ABG done showed that she was slightly acidotic with AP H07.342 and a PCO2 of 49.6.  UDS was positive for benzodiazepines and urine culture showed multiple species.   Assessment & Plan:   Principal Problem:   Asthma exacerbation Active Problems:   SIRS (systemic inflammatory response syndrome) (HCC)   Chest pain   Emesis   Hypokalemia  Acute asthma exacerbation  in the setting of Coronavirus with Acute on Chronic Hypoxic Respiratory Failure  -Work of breathing improved after IV steroid, magnesium, and bronchodilator treatments given in the ED. -At present,still wheezing mildly  -SpO2: 99 % O2 Flow Rate (L/min): 2 L/min -ABG done and showed    Component Value Date/Time   PHART 7.342 (L) 03/20/2021 0838   PCO2ART 49.6 (H) 03/20/2021 0838   PO2ART 93.9 03/20/2021 0838   HCO3 26.2 03/20/2021 0838   TCO2 26 03/01/2020 0605   O2SAT 97.3 03/20/2021 0838  -Continue Solu-Medrol 60 mg every 6 hours, DuoNeb every 6 hours changed to TID, albuterol nebulizer as needed. -Check Respiratory Virus Panel and was positive  for Coronavirus OC43 -C/w Guaifenesin 1200 mg, Flutter Valve, and Incentive Spirometry  -Continuous pulse ox, supplemental oxygen as needed. -Will need an Ambulatuory Home O2 Screen Prior to D/C and repeat CXR in the AM  -We will need to watch her extremely carefully and ensure compliance with the CPAP and ensure that she is appropriate pulmonary follow-up  SIRS -She was Tachycardic and tachypneic in the ED in the setting of asthma exacerbation.  -Febrile with temperature 100.8 F.  Chest x-ray not suggestive of pneumonia but she does have a upper respiratory infection in the setting of her coronavirus.  -Mild lactic acidosis resolvedwithout IV fluids.  -No leukocytosis on labs on admission and last WBC was 7.4.  Yesterday but trended up to 14.1 in the setting of steroid demargination -Covid and influenza PCR negative. Group A strep PCR negative. -No headaches or meningeal signs. -UA as below and urine culture pending.  -Checked procalcitonin level was <0.10 -Continue to monitor and trend carefully  Bradycardia -Patient became bradycardic when she was sleeping but then this morning she has very difficult to arouse and was very lethargic -Cardiology was consulted and recommended avoiding rate lowering medications -Cardiology feels that this occurred while she was sleeping and there is question to whether or not she was wearing her CPAP but I was personally in the room when she was wearing her CPAP and she was bradycardic and difficult to arouse -Cardiology feels that she would be prone to having bradycardia at the CPAP at night adjusted properly with the mask slips of and they feel that no additional work-up is needed -Cardiologist did advise the patient to work harder on her diet and exercise and recommended weight loss to treat her OSA and obesity hypoventilation -She will need orthostatic vital signs in the morning  Chest pain -Endorsing sharp substernal chest pain on and off.  -Work-up not suggestive of ACS-high-sensitivity troponin negative x2 and EKG without acute ischemic changes.  She became bradycardic however and repeat troponins remain flat with a trending up to 7 and then trending down to 3 -She does have a prior history of PE but no clinical signs of DVT on exam.  -Unable to order CT angiogram due to contrast allergy (anaphylaxis).  -Checked D-dimer level and was 0.39 but since it was not elevated will not start heparin for anticoagulation  -If  Respiratory Status not improving may obtain VQ scan in the morning and Pulmonary evaluation but will hold off for now -Cardiology was consulted due to her chest pain feel it occurs when the patient has been coughing and thinks it is secondary to chest wall tenderness from coughing.  Hypophosphatemia -Patient's phosphorus level was 2.0 -Replete with p.o. Phos-NAK -Continue to [place] and Replete as Necessary -Repeat Phos Level in the AM   Emesis, improving -LFTs normal.No complaints of abdominal pain or diarrhea. -Abdominal exam benign. Covid and influenza PCR negative.No active emesis at this time. -IV fluid hydration with NS at 125 mL/hr -C/w Antiemetic with Ondansetron 4 mg IV q6h as needed.  -Urine pregnancy test pending.  -UA showed a hazy appearance with 50 glucose, small hemoglobin, 5 ketones, rare bacteria, 6-10 RBCs per high-power field, 11-20 WBCs and urine culture showed multiple species and suggested recollection  Hypokalemia -Patient's K+ was 3.4 and improved to 4.8 -Replete with po KCl 40 mEQ x2 yesterday  -Mag Level was 2.0 -Monitor potassium and magnesium levels, replenish as needed. -Repeat CMP in the AM   Type 2 Diabetes Mellitus  -Not on  any medications. -Check A1c. Last HbA1c was 6.2 -Initiated Sensitive Novolog Sliding scale insulin ACHSas she is being treated with steroids.  Hypertension -Blood pressure stable.  -Not on any antihypertensives at home. -Continue to monitor BP per Protocol  -Last BP was 123/87  Normocytic Anemia -Patient's Hgb/Hct on Admission was 10.6/34.5 -> 10.6/36.3 -> 10.4/34.9 -Check Anemia Panel in the AM -Continue to Monitor for S/Sx of bleeding; currently no overt bleeding noted  -Repeat CBC in the AM   Super Morbid Obesity -Complicates overall prognosis and care -Estimated body mass index is 54.4 kg/m as calculated from the following:   Height as of this encounter:  (1.6 m).   Weight as of this  encounter: 139.3 kg. -Weight Loss and Dietary Counseling given    DVT prophylaxis: Enxoaparin 40 mg sq q24h Code Status: FULL CODE  Family Communication: No family present at bedside  Disposition Plan: Pending further clinical improvement in respiratory status  Status is: Inpatient  Remains inpatient appropriate because:Unsafe d/c plan, IV treatments appropriate due to intensity of illness or inability to take PO and Inpatient level of care appropriate due to severity of illness   Dispo: The patient is from: Home              Anticipated d/c is to: Home              Patient currently is not medically stable to d/c.   Difficult to place patient No  Consultants:   Cardiology   Procedures:  Antimicrobials:  Anti-infectives (From admission, onward)   None        Subjective: Seen and examined morning she is extremely lethargic when I walked in the room and had a very difficult time arousing her.  At this time she is bradycardic and minimally responsive and does open her eyes some.  Subsequently she is given a normal saline bolus and I put some orders in and she woke up spontaneously.  Cardiology was consulted given her bradycardia and her chest pain as she is describing chest pain when she woke up.  Cardiology evaluated and felt that she had asymptomatic bradycardia and recommended no further intervention.  She became lethargic on and off throughout the day but respiratory status is improving.  She denies any other concerns or complaints at this time.  Objective: Vitals:   03/20/21 0839 03/20/21 0923 03/20/21 1306 03/20/21 1524  BP:  128/81 123/87   Pulse:  66 (!) 53   Resp:   16 (!) 7  Temp:  97.6 F (36.4 C) 97.8 F (36.6 C)   TempSrc:  Axillary Oral   SpO2: 100% 98% 100%   Weight:      Height:        Intake/Output Summary (Last 24 hours) at 03/20/2021 1722 Last data filed at 03/20/2021 1500 Gross per 24 hour  Intake 360 ml  Output --  Net 360 ml   Filed Weights    03/18/21 1920 03/19/21 0500  Weight: 127 kg (!) 139.3 kg   Examination: Physical Exam:  Constitutional: WN/WD super morbidly obese African-American female who is extremely difficult to arouse and appears calm Eyes: Lids are normal.  Conjunctive are normal as well and sclera anicteric when she opens her eyes ENMT: External Ears, Nose appear normal. Grossly normal hearing. Neck: Appears normal, supple, no cervical masses, normal ROM, no appreciable thyromegaly; difficult to assess JVD given her body habitus Respiratory: Diminished to auscultation bilaterally with coarse breath sounds and slight wheezing, rales, rhonchi or  crackles. Normal respiratory effort and patient is not tachypenic. No accessory muscle use. Wearing CPAP Cardiovascular: Bradycardic Rate but regular rhythm, no murmurs / rubs / gallops. S1 and S2 auscultated. Has some mild LE edema Abdomen: Soft, non-tender, Distended 2/2 body habitus. Bowel sounds positive.  GU: Deferred. Musculoskeletal: No clubbing / cyanosis of digits/nails. No joint deformity upper and lower extremities.  Skin: No rashes, lesions, ulcers. No induration; Warm and dry.  Neurologic: CN 2-12 grossly intact with no focal deficits. Romberg sign and cerebellar reflexes not assessed.  Psychiatric: Impaired judgment and insight. Somnolent and Drowsy and difficult to arouse  Data Reviewed: I have personally reviewed following labs and imaging studies  CBC: Recent Labs  Lab 03/18/21 2020 03/19/21 0344 03/20/21 0356  WBC 7.8 7.4 14.1*  NEUTROABS 4.4 6.7 12.2*  HGB 10.6* 10.6* 10.4*  HCT 34.5* 36.3 34.9*  MCV 88.9 92.6 93.3  PLT 387 410* 433*   Basic Metabolic Panel: Recent Labs  Lab 03/18/21 2020 03/19/21 0344 03/20/21 0356  NA 141 145 139  K 3.3* 3.4*  3.4* 4.8  CL 106 111 109  CO2 26 23 23   GLUCOSE 113* 227* 244*  BUN 6 7 10   CREATININE 0.63 0.68 0.64  CALCIUM 8.6* 8.8* 8.8*  MG  --  2.2  2.2 2.2  PHOS  --  2.5 2.0*   GFR: Estimated  Creatinine Clearance: 141.5 mL/min (by C-G formula based on SCr of 0.64 mg/dL). Liver Function Tests: Recent Labs  Lab 03/18/21 2020 03/19/21 0344 03/20/21 0356  AST 24 27 35  ALT 21 23 29   ALKPHOS 59 57 58  BILITOT 0.1* 0.2* 0.4  PROT 6.9 7.2 7.0  ALBUMIN 3.1* 3.4* 3.3*   No results for input(s): LIPASE, AMYLASE in the last 168 hours. No results for input(s): AMMONIA in the last 168 hours. Coagulation Profile: Recent Labs  Lab 03/18/21 2020  INR 1.0   Cardiac Enzymes: No results for input(s): CKTOTAL, CKMB, CKMBINDEX, TROPONINI in the last 168 hours. BNP (last 3 results) No results for input(s): PROBNP in the last 8760 hours. HbA1C: No results for input(s): HGBA1C in the last 72 hours. CBG: Recent Labs  Lab 03/20/21 0722  GLUCAP 199*   Lipid Profile: No results for input(s): CHOL, HDL, LDLCALC, TRIG, CHOLHDL, LDLDIRECT in the last 72 hours. Thyroid Function Tests: No results for input(s): TSH, T4TOTAL, FREET4, T3FREE, THYROIDAB in the last 72 hours. Anemia Panel: No results for input(s): VITAMINB12, FOLATE, FERRITIN, TIBC, IRON, RETICCTPCT in the last 72 hours. Sepsis Labs: Recent Labs  Lab 03/18/21 2020 03/18/21 2200 03/19/21 0344  PROCALCITON  --   --  <0.10  LATICACIDVEN 2.0* 1.1  --     Recent Results (from the past 240 hour(s))  Resp Panel by RT-PCR (Flu A&B, Covid) Nasopharyngeal Swab     Status: None   Collection Time: 03/18/21  8:20 PM   Specimen: Nasopharyngeal Swab; Nasopharyngeal(NP) swabs in vial transport medium  Result Value Ref Range Status   SARS Coronavirus 2 by RT PCR NEGATIVE NEGATIVE Final    Comment: (NOTE) SARS-CoV-2 target nucleic acids are NOT DETECTED.  The SARS-CoV-2 RNA is generally detectable in upper respiratory specimens during the acute phase of infection. The lowest concentration of SARS-CoV-2 viral copies this assay can detect is 138 copies/mL. A negative result does not preclude SARS-Cov-2 infection and should not be  used as the sole basis for treatment or other patient management decisions. A negative result may occur with  improper specimen collection/handling, submission  of specimen other than nasopharyngeal swab, presence of viral mutation(s) within the areas targeted by this assay, and inadequate number of viral copies(<138 copies/mL). A negative result must be combined with clinical observations, patient history, and epidemiological information. The expected result is Negative.  Fact Sheet for Patients:  BloggerCourse.com  Fact Sheet for Healthcare Providers:  SeriousBroker.it  This test is no t yet approved or cleared by the [place] FDA and  has been authorized for detection and/or diagnosis of SARS-CoV-2 by FDA under an Emergency Use Authorization (EUA). This EUA will remain  in effect (meaning this test can be used) for the duration of the COVID-19 declaration under Section 564(b)(1) of the Act, 21 U.S.C.section 360bbb-3(b)(1), unless the authorization is terminated  or revoked sooner.       Influenza A by PCR NEGATIVE NEGATIVE Final   Influenza B by PCR NEGATIVE NEGATIVE Final    Comment: (NOTE) The Xpert Xpress SARS-CoV-2/FLU/RSV plus assay is intended as an aid in the diagnosis of influenza from Nasopharyngeal swab specimens and should not be used as a sole basis for treatment. Nasal washings and aspirates are unacceptable for Xpert Xpress SARS-CoV-2/FLU/RSV testing.  Fact Sheet for Patients: BloggerCourse.com  Fact Sheet for Healthcare Providers: SeriousBroker.it  This test is not yet approved or cleared by the [place] FDA and has been authorized for detection and/or diagnosis of SARS-CoV-2 by FDA under an Emergency Use Authorization (EUA). This EUA will remain in effect (meaning this test can be used) for the duration of the COVID-19 declaration under Section  564(b)(1) of the Act, 21 U.S.C. section 360bbb-3(b)(1), unless the authorization is terminated or revoked.  Performed at [facility], [address] [place], [place] [address]   Blood Culture (routine x 2)     Status: None (Preliminary result)   Collection Time: 03/18/21  8:20 PM   Specimen: BLOOD  Result Value Ref Range Status   Specimen Description   Final    BLOOD RIGHT ANTECUBITAL Performed at [facility], [address] [place], [place] [address]    Special Requests   Final    BOTTLES DRAWN AEROBIC AND ANAEROBIC Blood Culture adequate volume Performed at [facility], [address], [place], [place] [address]    Culture   Final    NO GROWTH 1 DAY Performed at [facility], [address], [place], [place] [address]    Report Status PENDING  Incomplete  Group A Strep by PCR     Status: None   Collection Time: 03/18/21  8:20 PM   Specimen: Nasopharyngeal Swab; Sterile Swab  Result Value Ref Range Status   Group A Strep by PCR NOT DETECTED NOT DETECTED Final    Comment: Performed at [facility], [address] [place], [place] [address]  Blood Culture (routine x 2)     Status: None (Preliminary result)   Collection Time: 03/18/21  8:24 PM   Specimen: BLOOD  Result Value Ref Range Status   Specimen Description   Final    BLOOD LEFT ANTECUBITAL Performed at [facility], [address] [place], [place] [address]    Special Requests   Final    BOTTLES DRAWN AEROBIC AND ANAEROBIC Blood Culture adequate volume Performed at [facility], [address] [place], [place] [address]    Culture   Final    NO GROWTH 1 DAY Performed at [facility], [address], [place], [place] [address]    Report Status PENDING  Incomplete  Urine culture     Status: Abnormal   Collection Time: 03/19/21  7:22 AM   Specimen: In/Out Cath Urine  Result Value Ref Range Status   Specimen Description   Final     IN/OUT CATH URINE Performed at [facility], [address], [place], [place] [address]    Special Requests   Final    NONE Performed at [facility], [address] [place], [place] [address]    Culture MULTIPLE SPECIES PRESENT, SUGGEST RECOLLECTION (A)  Final   Report Status 03/20/2021 FINAL  Final  Respiratory (~20 pathogens) panel by PCR     Status: Abnormal   Collection Time: 03/19/21 10:12 AM   Specimen: Nasopharyngeal Swab; Respiratory  Result Value Ref Range Status   Adenovirus NOT DETECTED NOT DETECTED Final   Coronavirus 229E NOT DETECTED NOT DETECTED Final    Comment: (NOTE) The Coronavirus on the Respiratory Panel, DOES NOT test for the novel  Coronavirus (2019 nCoV)    Coronavirus HKU1 NOT DETECTED NOT DETECTED Final   Coronavirus NL63 NOT DETECTED NOT DETECTED Final   Coronavirus OC43 DETECTED (A) NOT DETECTED Final   Metapneumovirus NOT DETECTED NOT DETECTED Final   Rhinovirus / Enterovirus NOT DETECTED NOT DETECTED Final   Influenza A NOT DETECTED NOT DETECTED Final   Influenza B NOT DETECTED NOT DETECTED Final   Parainfluenza Virus 1 NOT DETECTED NOT DETECTED Final   Parainfluenza Virus 2 NOT DETECTED NOT DETECTED Final   Parainfluenza Virus 3 NOT DETECTED NOT DETECTED Final   Parainfluenza Virus 4 NOT DETECTED NOT DETECTED Final   Respiratory Syncytial Virus NOT DETECTED NOT DETECTED Final   Bordetella pertussis NOT DETECTED NOT DETECTED Final   Bordetella Parapertussis NOT DETECTED NOT DETECTED Final   Chlamydophila pneumoniae NOT DETECTED NOT DETECTED Final   Mycoplasma pneumoniae NOT DETECTED NOT DETECTED Final    Comment: Performed at [facility], [address], [place], [place] [address]    RN Pressure Injury Documentation:     Estimated body mass index is 54.4 kg/m as calculated from the following:   Height as of this encounter:  (1.6 m).   Weight as of this encounter: 139.3 kg.  Malnutrition  Type:   Malnutrition Characteristics:   Nutrition Interventions:    Radiology Studies: DG CHEST PORT 1 VIEW  Result Date: 03/20/2021 CLINICAL DATA:  Shortness of breath. EXAM: PORTABLE CHEST 1 VIEW COMPARISON:  03/18/2021. FINDINGS: Borderline cardiomegaly and pulmonary venous congestion. Low lung volumes. Low lung volumes with mild bibasilar atelectasis. No prominent pleural effusion. No pneumothorax. No acute bony abnormality identified. IMPRESSION: 1.  Borderline cardiomegaly and pulmonary venous congestion. 2.  Low lung volumes with mild bibasilar atelectasis. Electronically Signed   By: Maisie Fus  Register   On: 03/20/2021 05:45   DG Chest Port 1 View  Result Date: 03/18/2021 CLINICAL DATA:  Asthma, shortness of breath, vomiting, and cough. EXAM: PORTABLE CHEST 1 VIEW COMPARISON:  03/07/2021 FINDINGS: Shallow inspiration. Atelectasis in the lung bases. Heart size and pulmonary vascularity are likely normal for technique. No apparent pleural effusion or pneumothorax. Mediastinal contours appear intact. Similar appearance to prior study. IMPRESSION: Shallow inspiration with atelectasis in the lung bases. Electronically Signed   By: Burman Nieves M.D.   On: 03/18/2021 21:33   Scheduled Meds: . ALPRAZolam  0.5 mg Oral BID  . ammonia  1 each Inhalation Once  .  ARIPiprazole  2 mg Oral Daily  . aspirin EC  81 mg Oral Daily  . brexpiprazole  2 mg Oral Daily  . busPIRone  7.5 mg Oral BID  . enoxaparin (LOVENOX) injection  40 mg Subcutaneous Q24H  . ferrous sulfate  325 mg Oral BID WC  . gabapentin  300 mg Oral TID  . guaiFENesin  1,200 mg Oral BID  . insulin aspart  0-5 Units Subcutaneous QHS  . [START ON 03/21/2021] insulin aspart  0-9 Units Subcutaneous TID WC  . ipratropium-albuterol  3 mL Nebulization TID  . lurasidone  20 mg Oral Daily  . methylPREDNISolone sodium succinate  60 mg Intravenous Q6H  . mometasone-formoterol  2 puff Inhalation BID  . multivitamin with minerals  1 tablet  Oral Daily  . potassium & sodium phosphates  1 packet Oral TID WC & HS  . sertraline  50 mg Oral Daily  . sucralfate  1 g Oral BID AC  . traZODone  200 mg Oral QHS   Continuous Infusions:   LOS: 1 day   Merlene Laughter, DO [facility] PAGER is on AMION  If 7PM-7AM, please contact night-coverage [email or web address]

## 2021-03-20 NOTE — Consult Note (Addendum)
Cardiology Consultation:   Patient ID: Felicia Bradley MRN: [id number]; DOB: [birth year]  Admit date: 03/18/2021 Date of Consult: 03/20/2021  PCP:  [facility], [facility]   [facility]  Cardiologist:  Kristeen MissPhilip Regine Christian, MD New Advanced Practice Provider:  No care team member to display Electrophysiologist:  None    Patient Profile:   Felicia Bradley is a 30 y.o. female with a hx of PE (possible on VQ 2015), DVT w/ recannulation per Care Everywhere, bipolar, DM2, HTN, morbid obesity, asthma, tob use, Covid 01/2020, OSA on CPAP, who is being seen today for the evaluation of bradycardia at the request of Dr Marland McalpineSheikh.  History of Present Illness:   Felicia Bradley was admitted 04/06 w/ c/o fevers and vomiting, + Coronavirus OC43, but negative for Novel Covid 19.   Admitted for SIRS, Asthma, sharp CP.   Treated with steroids, nebs, O2, IVF. D-dimer normal range.   Overnight, she had sinus bradycardia with a heart rate that was dropping into the 30s at times.  Cardiology was asked to evaluate her.  Felicia Bradley was on the CPAP machine when her heart rate dropped.  She has never been told she has bradycardia before.  She wore a monitor remotely for palpitations, but that did not show any significant abnormality.  She says she has passed out 9 times.  She says that it can happen when she is in the shower, it can happen when she is at work standing or talking to diners.  It can happen at other times as well.  Sometimes she gets a prodrome of being lightheaded but is not clear that she can avert falling.   No seizure activity has been reported.  As soon as she wakes up, she knows who and where she is.  She has not been evaluated for this.  Of note, during the interview, she kept falling asleep.   Past Medical History:  Diagnosis Date  . Asthma   . Bipolar disorder (HCC)   . Diabetes mellitus without complication (HCC)    type 2  . DVT (deep venous thrombosis) (HCC) 2015   w/  recannulation per Care Everywhere  . Hypertension   . Obesity   . Pulmonary embolism (HCC) 2015    Past Surgical History:  Procedure Laterality Date  . CESAREAN SECTION       Home Medications:  Prior to Admission medications   Medication Sig Start Date End Date Taking? Authorizing Provider  acetaminophen (TYLENOL) 500 MG tablet Take 500 mg by mouth every 6 (six) hours as needed for mild pain.   Yes [provider]  albuterol (VENTOLIN HFA) 108 (90 Base) MCG/ACT inhaler Inhale 1 puff into the lungs 2 (two) times daily as needed for wheezing or shortness of breath.  11/10/14  Yes [provider]  ALPRAZolam Prudy Feeler(XANAX) 0.5 MG tablet Take 0.5 mg by mouth 2 (two) times daily. 01/23/21  Yes [provider]  ARIPiprazole (ABILIFY) 2 MG tablet Take 2 mg by mouth daily.   Yes [provider]  aspirin EC 81 MG tablet Take 81 mg by mouth daily. Swallow whole.   Yes [provider]  brexpiprazole (REXULTI) 2 MG TABS tablet Take 2 mg by mouth daily.   Yes [provider]  budesonide-formoterol (SYMBICORT) 160-4.5 MCG/ACT inhaler Inhale 2 puffs into the lungs 2 (two) times daily.   Yes [provider]  busPIRone (BUSPAR) 7.5 MG tablet Take 7.5 mg by mouth in the morning and at bedtime.  Yes [provider]  celecoxib (CELEBREX) 100 MG capsule Take 100 mg by mouth 2 (two) times daily as needed for pain. 02/24/21  Yes [provider]  cyclobenzaprine (FLEXERIL) 10 MG tablet Take 10 mg by mouth 3 (three) times daily as needed for muscle spasms.   Yes [provider]  diclofenac Sodium (VOLTAREN) 1 % GEL Apply 2 g topically 4 (four) times daily. Patient taking differently: Apply 2 g topically 2 (two) times daily as needed (pain). 06/28/20  Yes Hall-Potvin, [place], PA-C  EPINEPHrine 0.3 mg/0.3 mL IJ SOAJ injection Inject 0.3 mg into the muscle as needed for anaphylaxis.  11/10/14  Yes [provider]  ferrous  sulfate 325 (65 FE) MG EC tablet Take 325 mg by mouth 2 (two) times daily with a meal. 11/10/14  Yes [provider]  gabapentin (NEURONTIN) 300 MG capsule Take 300 mg by mouth 3 (three) times daily.   Yes [provider]  ipratropium-albuterol (DUONEB) 0.5-2.5 (3) MG/3ML SOLN Take 3 mLs by nebulization in the morning, at noon, and at bedtime. 07/19/19  Yes [provider]  LATUDA 20 MG TABS tablet Take 20 mg by mouth daily. 11/19/20  Yes [provider]  Multiple Vitamin (MULTIVITAMIN WITH MINERALS) TABS tablet Take 1 tablet by mouth daily.   Yes [provider]  oxyCODONE-acetaminophen (PERCOCET) 10-325 MG tablet Take 1 tablet by mouth 3 (three) times daily as needed for severe pain. 02/24/21  Yes [provider]  sertraline (ZOLOFT) 50 MG tablet Take 50 mg by mouth daily. 01/13/21  Yes [provider]  sucralfate (CARAFATE) 1 GM/10ML suspension Take 1 g by mouth 2 (two) times daily before a meal.   Yes [provider]  traZODone (DESYREL) 100 MG tablet Take 200 mg by mouth at bedtime. 11/19/20  Yes [provider]  Multiple Vitamins-Minerals (MULTIVITAMIN ADULT PO) Take 1 tablet by mouth daily.    [provider]    Inpatient Medications: Scheduled Meds: . ALPRAZolam  0.5 mg Oral BID  . ammonia  1 each Inhalation Once  . ARIPiprazole  2 mg Oral Daily  . aspirin EC  81 mg Oral Daily  . brexpiprazole  2 mg Oral Daily  . busPIRone  7.5 mg Oral BID  . enoxaparin (LOVENOX) injection  40 mg Subcutaneous Q24H  . ferrous sulfate  325 mg Oral BID WC  . gabapentin  300 mg Oral TID  . guaiFENesin  1,200 mg Oral BID  . ipratropium-albuterol  3 mL Nebulization TID  . lurasidone  20 mg Oral Daily  . methylPREDNISolone sodium succinate  60 mg Intravenous Q6H  . mometasone-formoterol  2 puff Inhalation BID  . multivitamin with minerals  1 tablet Oral Daily  . potassium & sodium phosphates  1 packet Oral TID WC & HS   . sertraline  50 mg Oral Daily  . sucralfate  1 g Oral BID AC  . traZODone  200 mg Oral QHS   Continuous Infusions:  PRN Meds: acetaminophen **OR** acetaminophen, albuterol, cyclobenzaprine, naLOXone (NARCAN)  injection, ondansetron (ZOFRAN) IV  Allergies:    Allergies  Allergen Reactions  . Contrast Media [Iodinated Diagnostic Agents] Anaphylaxis  . Dilaudid [Hydromorphone] Anaphylaxis  . Tramadol Itching  . Apple Hives  . Banana Hives  . Coconut Oil Hives and Itching  . Dexamethasone Nausea And Vomiting and Hives    "feels like my pubic hair is on fire"  . Other Hives    Zucchini and squash  . Solu-Medrol [  Methylprednisolone] Nausea And Vomiting  . Toradol [Ketorolac Tromethamine] Hives and Itching    Social History:   Social History   Socioeconomic History  . Marital status: Single    Spouse name: Not on file  . Number of children: Not on file  . Years of education: Not on file  . Highest education level: Not on file  Occupational History  . Occupation: Programmer, systems: [employer]  Tobacco Use  . Smoking status: Never Smoker  . Smokeless tobacco: Never Used  Substance and Sexual Activity  . Alcohol use: No  . Drug use: No  . Sexual activity: Yes  Other Topics Concern  . Not on file  Social History Narrative  . Not on file   Social Determinants of Health   Financial Resource Strain: Not on file  Food Insecurity: Not on file  Transportation Needs: Not on file  Physical Activity: Not on file  Stress: Not on file  Social Connections: Not on file  Intimate Partner Violence: Not on file    Family History:   Family History  Problem Relation Age of Onset  . Diabetes Mother   . Asthma Mother   . Asthma Maternal Aunt   . Diabetes Maternal Aunt     Family Status  Relation Name Status  . Mother  Alive  . [employer]  . Father  Alive     ROS:  Please see the history of present illness.  All other ROS reviewed and negative.      Physical Exam/Data:   Vitals:   03/20/21 0653 03/20/21 0729 03/20/21 0839 03/20/21 0923  BP: 99/70 108/77  128/81  Pulse: (!) 46 (!) 45  66  Resp: 16     Temp: (!) 97.4 F (36.3 C)   97.6 F (36.4 C)  TempSrc: Axillary   Axillary  SpO2: 100%  100% 98%  Weight:      Height:        Intake/Output Summary (Last 24 hours) at 03/20/2021 1004 Last data filed at 03/19/2021 1630 Gross per 24 hour  Intake 632.18 ml  Output --  Net 632.18 ml   Last 3 Weights 03/19/2021 03/18/2021 03/06/2021  Weight (lbs) 307 lb 1.6 oz 279 lb 15.8 oz 280 lb  Weight (kg) 139.3 kg 127 kg 127.007 kg     Body mass index is 54.4 kg/m.  General:  Well nourished, well developed, morbidly obese female, in no acute distress HEENT: normal Lymph: no adenopathy Neck: no JVD seen Endocrine:  No thryomegaly Vascular: No carotid bruits; 4/4 extremity pulses 2+   Cardiac:  normal S1, S2; RRR; no murmur  Lungs:  Rales and wheezing to auscultation bilaterally, no rhonchi, rales improve w/ cough, but coughing is painful Abd: soft, nontender, no hepatomegaly  Ext: no edema Musculoskeletal:  No deformities, BUE and BLE strength normal and equal Skin: warm and dry  Neuro:  CNs 2-12 intact, no focal abnormalities noted Psych:  Normal affect   EKG:  The EKG was personally reviewed and demonstrates: 4/7 ECG is sinus bradycardia, heart rate 44, sinus arrhythmia noted, possible early repol Telemetry:  Telemetry was personally reviewed and demonstrates: Sinus rhythm with a normal rate while she is awake.  Overnight, her heart rate dropped and was maintaining in the 40s and 50s, occasionally slow into the high 30s.  Relevant CV Studies:  ECHO:  [facility] 02/17/2021 LeftVentricle: Normal left ventricular size.  Marland Kitchen LeftVentricle: There is borderline hypertrophy.  . LeftVentricle: Systolic  function is normal. EF: 60-65%.  . LeftVentricle: Wall motion is normal. Doppler parameters indicate normal diastolic function.   .  RightVentricle: Right ventricle is normal.  . RightVentricle: Systolic function is normal. Normal tricuspid annular  plane systolic excursion (TAPSE) >1.7 cm.  . TricuspidValve: There is mild regurgitation.  . Pericardium: There is no pericardial effusion.    Laboratory Data:  High Sensitivity Troponin:   Recent Labs  Lab 03/18/21 2206 03/19/21 0140 03/20/21 0356  TROPONINIHS 2 2 7      Chemistry Recent Labs  Lab 03/18/21 2020 03/19/21 0344 03/20/21 0356  NA 141 145 139  K 3.3* 3.4*  3.4* 4.8  CL 106 111 109  CO2 26 23 23   GLUCOSE 113* 227* 244*  BUN 6 7 10   CREATININE 0.63 0.68 0.64  CALCIUM 8.6* 8.8* 8.8*  GFRNONAA >60 >60 >60  ANIONGAP 9 11 7     Recent Labs  Lab 03/18/21 2020 03/19/21 0344 03/20/21 0356  PROT 6.9 7.2 7.0  ALBUMIN 3.1* 3.4* 3.3*  AST 24 27 35  ALT 21 23 29   ALKPHOS 59 57 58  BILITOT 0.1* 0.2* 0.4   Hematology Recent Labs  Lab 03/18/21 2020 03/19/21 0344 03/20/21 0356  WBC 7.8 7.4 14.1*  RBC 3.88 3.92 3.74*  HGB 10.6* 10.6* 10.4*  HCT 34.5* 36.3 34.9*  MCV 88.9 92.6 93.3  MCH 27.3 27.0 27.8  MCHC 30.7 29.2* 29.8*  RDW 15.5 15.7* 15.7*  PLT 387 410* 433*   BNPNo results for input(s): BNP, PROBNP in the last 168 hours.  DDimer  Recent Labs  Lab 03/19/21 0344  DDIMER 0.39   No results found for: TSH Lab Results  Component Value Date   TRIG 72 01/18/2020   Lab Results  Component Value Date   ALT 29 03/20/2021   AST 35 03/20/2021   ALKPHOS 58 03/20/2021   BILITOT 0.4 03/20/2021   Lab Results  Component Value Date   HGBA1C 6.2 (H) 01/14/2020   Lab Results  Component Value Date   FERRITIN 30 01/18/2020   Radiology/Studies:  DG CHEST PORT 1 VIEW  Result Date: 03/20/2021 CLINICAL DATA:  Shortness of breath. EXAM: PORTABLE CHEST 1 VIEW COMPARISON:  03/18/2021. FINDINGS: Borderline cardiomegaly and pulmonary venous congestion. Low lung volumes. Low lung volumes with mild bibasilar atelectasis. No prominent  pleural effusion. No pneumothorax. No acute bony abnormality identified. IMPRESSION: 1.  Borderline cardiomegaly and pulmonary venous congestion. 2.  Low lung volumes with mild bibasilar atelectasis. Electronically Signed   By: 01/16/2020  Register   On: 03/20/2021 05:45   DG Chest Port 1 View  Result Date: 03/18/2021 CLINICAL DATA:  Asthma, shortness of breath, vomiting, and cough. EXAM: PORTABLE CHEST 1 VIEW COMPARISON:  03/07/2021 FINDINGS: Shallow inspiration. Atelectasis in the lung bases. Heart size and pulmonary vascularity are likely normal for technique. No apparent pleural effusion or pneumothorax. Mediastinal contours appear intact. Similar appearance to prior study. IMPRESSION: Shallow inspiration with atelectasis in the lung bases. Electronically Signed   By: Maisie Fus M.D.   On: 03/18/2021 21:33     Assessment and Plan:   1. Bradycardia: -She had consistent bradycardia while asleep. -I will check with respiratory therapy to make sure her O2 sats are being maintained while on CPAP >> they will look at the machine to make sure settings are right -No rate lowering medications -At this time, no indication for pacemaker  2.  Syncope: -Some of her symptoms sound vasovagal as she can get lightheaded and then passed  out while in the shower plan -there may also be a component of obesity hypoventilation and/or hypoxia as she may not be wearing her oxygen when they happen -No recent palpitations -She says she has passed out 9 times total, but it is not clear if they have started occurring more frequently. -Discuss monitor with MD  3.  Chest pain -It occurs with coughing.  She has chest wall tenderness. -No history of chest pain prior to her current illness.   Risk Assessment/Risk Scores:   For questions or updates, please contact [facility] Please consult [email or web address] for contact info under    Signed, Theodore Demark, PA-C  03/20/2021 10:04 AM   Attending Note:   The  patient was seen and examined.  Agree with assessment and plan as noted above.  Changes made to the above note as needed.  Patient seen and independently examined with Theodore Demark, PA .   We discussed all aspects of the encounter. I agree with the assessment and plan as stated above.  1.   Asymptomatic nighttime bradycardia:  Aysmptomatic,  occured while she was sleeping . There is a question as to whether  Or not she was wearing her CPAP ( and if the mask was still on or had slipped off)  She is morbidly obese and has OSA , obesity hypoventilation.  She would be prone to having bradycardia if the CPAP is not adjusted properly of if the mask slips off.  She has normal HR when she is awake.  No additional work up needed She does not need to follow up with cardiology. I did advise her to work very hard on diet, exercise, weight loss as this will be the "real" treatment of her OSA and obesity hypoventilation .   I have spent a total of 40 minutes with patient reviewing hospital  notes , telemetry, EKGs, labs and examining patient as well as establishing an assessment and plan that was discussed with the patient. > 50% of time was spent in direct patient care.  [facility] will sign off.   Medication Recommendations:   Other recommendations (labs, testing, etc):   Follow up as an outpatient:   With her primary md.  She does not need to follow up with cardiology     Alvia Grove., MD, [facility] 03/20/2021, 11:46 AM [address]  [address] Office [phone] Pager [phone]

## 2021-03-20 NOTE — Progress Notes (Signed)
   03/20/21 0729  Vitals  BP 108/77  BP Location Left Arm  BP Method Automatic  Patient Position (if appropriate) Lying  Pulse Rate (!) 45  Pulse Rate Source Monitor  Level of Consciousness  Level of Consciousness Responds to Voice (MD paged and RRT paged to make aware)  MEWS COLOR  MEWS Score Color Yellow  MEWS Score  MEWS Temp 0  MEWS Systolic 0  MEWS Pulse 1  MEWS RR 0  MEWS LOC 1  MEWS Score 2  Provider Notification  Provider Name/Title [facility]  Date Provider Notified 03/20/21  Time Provider Notified [phone]  Notification Type Page  Notification Reason Change in status  Provider response See new orders  Date of Provider Response 03/20/21  Rapid Response Notification  Name of Rapid Response RN Notified Sarah RN  Date Rapid Response Notified 03/20/21  Time Rapid Response Notified 0730    Pt able to wake up without intervention. MD aware. Call paramenters for MD  HR < 50 sustained.

## 2021-03-20 NOTE — TOC Progression Note (Signed)
Transition of Care [facility]) - Progression Note    Patient Details  Name: Felicia Bradley MRN: [id number] Date of Birth: 1991-03-25  Transition of Care [facility]) CM/SW Contact  Geni Bers, RN Phone Number: 03/20/2021, 2:08 PM  Clinical Narrative:    Spoke with pt concerning discharge plans and Home Health. Pt is active with [facility] and will need order to continue. Pt states there are no other needs.   Expected Discharge Plan: Home/Self Care Barriers to Discharge: No Barriers Identified  Expected Discharge Plan and Services Expected Discharge Plan: Home/Self Care       Living arrangements for the past 2 months: Single Family Home                             HH Agency: [facility] Date HH Agency Contacted: 03/20/21 Time HH Agency Contacted: 1408 Representative spoke with at [facility] Agency: Pearson Grippe   Social Determinants of Health (SDOH) Interventions    Readmission Risk Interventions No flowsheet data found.

## 2021-03-20 NOTE — Progress Notes (Signed)
Tele called to report patient's HR in the 30's. Rechecked vitals  Temp 97.4 axillary BP 99/70 HR 46 RR 16 O2 100% on 4L CPap EKG completed  03/20/21 8110 03/20/21 3159  Provider Notification  Provider Name/Title E. Anna Genre, NP [facility]  Date Provider Notified 03/20/21 03/20/21  Time Provider Notified [phone]  Notification Type Page Page  Notification Reason Change in status (HR in 40's pt. lethargic) Change in status (HR in 40's Pt. lethargic)  Provider response Other (Comment) (Update current provider for 7am) See new orders  Date of Provider Response 03/20/21 03/20/21  Time of Provider Response 0701 0719

## 2021-03-21 ENCOUNTER — Inpatient Hospital Stay (HOSPITAL_COMMUNITY): Payer: Medicaid Other

## 2021-03-21 DIAGNOSIS — J9621 Acute and chronic respiratory failure with hypoxia: Secondary | ICD-10-CM

## 2021-03-21 LAB — CBC WITH DIFFERENTIAL/PLATELET
Abs Immature Granulocytes: 0.16 10*3/uL — ABNORMAL HIGH (ref 0.00–0.07)
Basophils Absolute: 0 10*3/uL (ref 0.0–0.1)
Basophils Relative: 0 %
Eosinophils Absolute: 0 10*3/uL (ref 0.0–0.5)
Eosinophils Relative: 0 %
HCT: 35.8 % — ABNORMAL LOW (ref 36.0–46.0)
Hemoglobin: 10.3 g/dL — ABNORMAL LOW (ref 12.0–15.0)
Immature Granulocytes: 1 %
Lymphocytes Relative: 14 %
Lymphs Abs: 1.8 10*3/uL (ref 0.7–4.0)
MCH: 27.2 pg (ref 26.0–34.0)
MCHC: 28.8 g/dL — ABNORMAL LOW (ref 30.0–36.0)
MCV: 94.7 fL (ref 80.0–100.0)
Monocytes Absolute: 0.2 10*3/uL (ref 0.1–1.0)
Monocytes Relative: 2 %
Neutro Abs: 10.9 10*3/uL — ABNORMAL HIGH (ref 1.7–7.7)
Neutrophils Relative %: 83 %
Platelets: 406 10*3/uL — ABNORMAL HIGH (ref 150–400)
RBC: 3.78 MIL/uL — ABNORMAL LOW (ref 3.87–5.11)
RDW: 16 % — ABNORMAL HIGH (ref 11.5–15.5)
WBC: 13.1 10*3/uL — ABNORMAL HIGH (ref 4.0–10.5)
nRBC: 0 % (ref 0.0–0.2)

## 2021-03-21 LAB — GLUCOSE, CAPILLARY
Glucose-Capillary: 276 mg/dL — ABNORMAL HIGH (ref 70–99)
Glucose-Capillary: 240 mg/dL — ABNORMAL HIGH (ref 70–99)
Glucose-Capillary: 279 mg/dL — ABNORMAL HIGH (ref 70–99)

## 2021-03-21 LAB — COMPREHENSIVE METABOLIC PANEL
ALT: 26 U/L (ref 0–44)
AST: 17 U/L (ref 15–41)
Albumin: 3.3 g/dL — ABNORMAL LOW (ref 3.5–5.0)
Alkaline Phosphatase: 51 U/L (ref 38–126)
Anion gap: 7 (ref 5–15)
BUN: 15 mg/dL (ref 6–20)
CO2: 25 mmol/L (ref 22–32)
Calcium: 9.1 mg/dL (ref 8.9–10.3)
Chloride: 110 mmol/L (ref 98–111)
Creatinine, Ser: 0.69 mg/dL (ref 0.44–1.00)
GFR, Estimated: >60 mL/min (ref >60)
Glucose, Bld: 247 mg/dL — ABNORMAL HIGH (ref 70–99)
Potassium: 4.5 mmol/L (ref 3.5–5.1)
Sodium: 142 mmol/L (ref 135–145)
Total Bilirubin: 0.4 mg/dL (ref 0.3–1.2)
Total Protein: 6.9 g/dL (ref 6.5–8.1)

## 2021-03-21 LAB — IRON AND TIBC
Iron: 29 ug/dL (ref 28–170)
Saturation Ratios: 8 % — ABNORMAL LOW (ref 10.4–31.8)
TIBC: 364 ug/dL (ref 250–450)
UIBC: 335 ug/dL

## 2021-03-21 LAB — FOLATE: Folate: 4.4 ng/mL — ABNORMAL LOW (ref >5.9)

## 2021-03-21 LAB — MAGNESIUM: Magnesium: 2.4 mg/dL (ref 1.7–2.4)

## 2021-03-21 LAB — HEMOGLOBIN A1C
Hgb A1c MFr Bld: 6.2 % — ABNORMAL HIGH (ref 4.8–5.6)
Mean Plasma Glucose: 131.24 mg/dL

## 2021-03-21 LAB — PHOSPHORUS: Phosphorus: 2.3 mg/dL — ABNORMAL LOW (ref 2.5–4.6)

## 2021-03-21 LAB — FERRITIN: Ferritin: 16 ng/mL (ref 11–307)

## 2021-03-21 LAB — RETICULOCYTES
Immature Retic Fract: 35.6 % — ABNORMAL HIGH (ref 2.3–15.9)
RBC.: 3.76 MIL/uL — ABNORMAL LOW (ref 3.87–5.11)
Retic Count, Absolute: 65.4 10*3/uL (ref 19.0–186.0)
Retic Ct Pct: 1.7 % (ref 0.4–3.1)

## 2021-03-21 LAB — VITAMIN B12: Vitamin B-12: 260 pg/mL (ref 180–914)

## 2021-03-21 IMAGING — DX DG CHEST 1V PORT
1 series · 1 of 1 positions shown · non-contrast
Comparison: [DATE]

CLINICAL DATA: Shortness of breath

EXAM:
PORTABLE CHEST 1 VIEW

[chest ap]
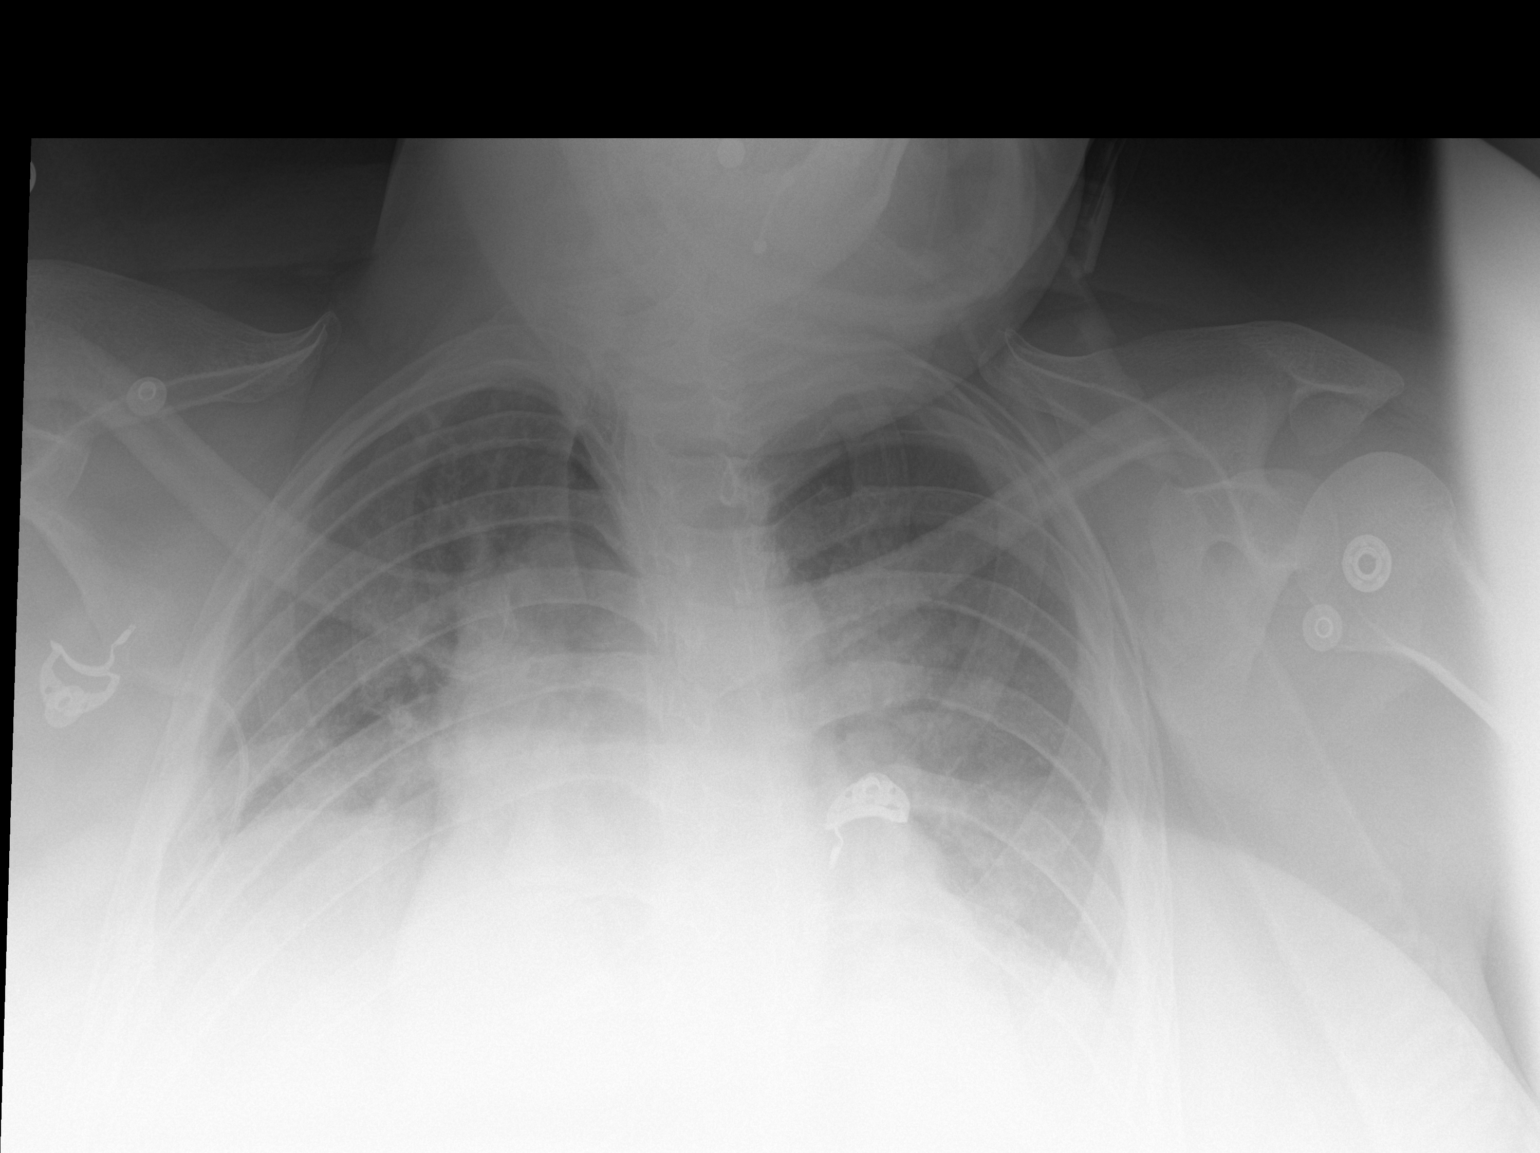

[1 of 1 positions shown; findings below may reference images not displayed]

FINDINGS: Low volume, lordotic, underpenetrated AP portable examination. There
are possible small layering pleural effusions. No acute appearing
airspace opacity. Cardiomegaly, likely exaggerated by technique.
IMPRESSION: Low volume, lordotic, underpenetrated AP portable examination. There
are possible small layering pleural effusions. No acute appearing
airspace opacity. PA and lateral radiographs may be helpful to
further evaluate.

## 2021-03-21 MED ORDER — GUAIFENESIN ER 600 MG PO TB12: 600.0000 mg | ORAL_TABLET | Freq: Two times a day (BID) | ORAL | 0 refills | Status: DC

## 2021-03-21 MED ORDER — POLYETHYLENE GLYCOL 3350 17 G PO PACK: 17.0000 g | PACK | Freq: Two times a day (BID) | ORAL | Status: DC

## 2021-03-21 MED ORDER — K PHOS MONO-SOD PHOS DI & MONO 155-852-130 MG PO TABS
500.0000 mg | ORAL_TABLET | Freq: Once | ORAL | Status: DC
  Filled 2021-03-21: qty 2

## 2021-03-21 MED ORDER — FOLIC ACID 1 MG PO TABS: 1.0000 mg | ORAL_TABLET | Freq: Every day | ORAL | 0 refills | Status: DC

## 2021-03-21 MED ORDER — SENNOSIDES-DOCUSATE SODIUM 8.6-50 MG PO TABS: 1.0000 | ORAL_TABLET | Freq: Every day | ORAL | 0 refills | Status: DC

## 2021-03-21 MED ORDER — SENNOSIDES-DOCUSATE SODIUM 8.6-50 MG PO TABS: 1.0000 | ORAL_TABLET | Freq: Two times a day (BID) | ORAL | Status: DC

## 2021-03-21 MED ORDER — FUROSEMIDE 10 MG/ML IJ SOLN
40.0000 mg | Freq: Once | INTRAMUSCULAR | Status: AC
  Administered 2021-03-21: 40 mg via INTRAVENOUS
  Filled 2021-03-21: qty 4

## 2021-03-21 MED ORDER — PREDNISONE 10 MG (21) PO TBPK: ORAL_TABLET | ORAL | 0 refills | Status: DC

## 2021-03-21 MED ORDER — FOLIC ACID 1 MG PO TABS
1.0000 mg | ORAL_TABLET | Freq: Every day | ORAL | Status: DC
  Administered 2021-03-21: 1 mg via ORAL
  Filled 2021-03-21: qty 1

## 2021-03-21 NOTE — Progress Notes (Signed)
   03/20/21 2225  Provider Notification  Provider Name/Title Linton Flemings, NP  Date Provider Notified 03/20/21  Time Provider Notified 2225  Notification Type Page  Notification Reason Requested by patient/family (pt requested pain medication, but quickly feel asleep within 5 minutes of asking for pain medication, pt still very drowsy)  Provider response No new orders  Date of Provider Response 03/20/21  Time of Provider Response 2228

## 2021-03-21 NOTE — Discharge Summary (Addendum)
Physician Discharge Summary  Felicia Bradley ZOX:[id number] DOB: [birth year] DOA: 03/18/2021  PCP: [facility]  Admit date: 03/18/2021 Discharge date: 03/21/2021  Admitted From: Home Disposition: Home  Recommendations for Outpatient Follow-up:  1. Follow up with PCP in 1-2 weeks 2. Follow up with Pulmonary as an outpatient 3. Follow up Cardiology as an outpatient  4. Follow-up with gastroenterology for suspected hemorrhoidal bleeding 5. Please obtain CMP/CBC, Mag, Phos in one week 6. Please follow up on the following pending results:  Home Health: YES Equipment/Devices: None    Discharge Condition: Stable CODE STATUS: FULL CODE Diet recommendation: Heart Healthy Diet   Brief/Interim Summary: The patient is a super morbidly obese African-American female with a past medical history significant for but not limited to asthma, type 2 diabetes mellitus, hypertension, history of PE as well as other comorbidities who presented to the ED with chief complaint of shortness of breath, cough, fevers and vomiting. She was noted to be tachypneic with accessory muscle use and tachycardic in the ED. She was febrile at 100.8 but not hypoxic. Labs showed a WBC of 7.8 hemoglobin 10.6 and platelet count of 387,000. Her metabolic panel did show that she had a slight hypokalemia. Covid and influenza testing was negative. Initial lactic acid was 2.0 but then improved to 1.1. Blood cultures were obtained and were pending. Urinalysis and urine culture also obtained and pending. Chest x-ray was not suggestive of pneumonia. She is given acetaminophen, albuterol and ipratropium inhaler treatments, fentanyl and IV Solu-Medrol 125 mg as well as ondansetron and IV magnesium 2 mg. She is also given a continuous albuterol neb treatment.  She noted that she is not feeling well for last 3 days and was wheezing and coughing and feeling short of breath. She also described having sharp substernal chest pain for  last 3 days. At home she was found to have fevers and vomiting but denies any abdominal pain, diarrhea or dysuria. She is previously on medication for high blood pressure which was stopped due to blood pressure dropping. She noted that her diabetes was secondary to steroid use for her asthma and she does not take any medications at home  **Interim History Patient was very lethargic this morning and became bradycardic.  She is very difficult to arouse so rapid response was called on her around 7 AM this morning.  She is very slow to arouse so she is given a normal saline bolus and obtain a UDS as well as an ABG on her.  She is going to be given smelling salts and given Narcan but she woke up prior to this.  Because she was bradycardic and complained of chest pain when she came to cardiology was consulted and they felt that this was asymptomatic nighttime bradycardia and recommended no additional work-up.  She continuee to be lethargic throughout the day but wearing CPAP now.  ABG done showed that she was slightly acidotic with AP H07.342 and a PCO2 of 49.6.  UDS was positive for benzodiazepines and urine culture showed multiple species.  Today the patient was doing much better but complained about slight bright red blood per rectum after she passed a hard bowel movement.  Subsequent bowel movements were brown and not have any blood in them.  She was given a stool softener.  Breathing is back to her baseline and she is on her home O2 supplementation.  I spoke to Dr. Wynona Neat of pulmonary who recommend the patient follow-up for her CPAP qualification.  He recommended  discharging her home on her oxygen and steroid taper.  She is stable to be discharged at this time and she is back to baseline.  She will need to follow-up with PCP, pulmonary, cardiology as well as gastroenterology in the outpatient setting and she understands agrees with plan of care.   Discharge Diagnoses:  Principal Problem:   Asthma  exacerbation Active Problems:   SIRS (systemic inflammatory response syndrome) (HCC)   Chest pain   Emesis   Hypokalemia  Acute asthma exacerbation in the setting of Coronavirus OC43 with Acute on Chronic Hypoxic Respiratory Failure -Work of breathing improved after IV steroid, magnesium, and bronchodilator treatments given in the ED. -At present,still wheezingmildly -SpO2: 95 % O2 Flow Rate (L/min): 4 L/min -ABG done and showed Labs (Brief)          Component Value Date/Time   PHART 7.342 (L) 03/20/2021 0838   PCO2ART 49.6 (H) 03/20/2021 0838   PO2ART 93.9 03/20/2021 0838   HCO3 26.2 03/20/2021 0838   TCO2 26 03/01/2020 0605   O2SAT 97.3 03/20/2021 0838    -Continue Solu-Medrol 60 mg every 6 hours, DuoNeb every 6 hourschanged to TID, albuterol nebulizer as needed while hospitalized and resume home nebs -Check Respiratory Virus Panel and was positive for Coronavirus OC43 -COVID-19 and Flu A/B PCR NEGATIVE  -C/w Guaifenesin 1200 mg, Flutter Valve, and Incentive Spirometry  -Continuous pulse ox, supplemental oxygen as needed. -Will need an Ambulatuory Home O2 Screen Prior to D/C and repeat CXR in the AM -We will need to watch her extremely carefully and ensure compliance with the CPAP and ensure that she is appropriate pulmonary follow-up -She is back to her baseline and improved so we will send her on a steroid taper; already discharged given her a dose of IV Lasix secondary to her chest x-ray shows some small bilateral pleural effusions -She is advised repeat her chest x-ray in 3 to 6 weeks  SIRS in the setting of Coronavirus OC43 -She wasTachycardic and tachypneic in the ED in the setting of asthma exacerbation. -Febrile with temperature 100.8 F. Chest x-ray not suggestive of pneumonia but she does have a upper respiratory infection in the setting of her coronavirus. -Mild lactic acidosis resolvedwithout IV fluids. -No leukocytosis on labs on  admissionand last WBC was 7.4. Yesterday but trended up to 14.1 in the setting of steroid demargination -Covid and influenza PCR negative. Group A strep PCR negative. -No headaches or meningeal signs. Renata Caprice belowand urine culture pending. -Checkedprocalcitonin level was <0.10 -Continue to monitor and trend carefully  Bradycardia -Patient became bradycardic when she was sleeping but then this morning she has very difficult to arouse and was very lethargic -Cardiology was consulted and recommended avoiding rate lowering medications -Cardiology feels that this occurred while she was sleeping and there is question to whether or not she was wearing her CPAP but I was personally in the room when she was wearing her CPAP and she was bradycardic and difficult to arouse -Cardiology feels that she would be prone to having bradycardia at the CPAP at night adjusted properly with the mask slips of and they feel that no additional work-up is needed -Cardiologist did advise the patient to work harder on her diet and exercise and recommended weight loss to treat her OSA and obesity hypoventilation -She is stable to be discharged and cardiology follow-up in outpatient setting  Chest pain -Endorsing sharp substernal chest pain on and off. -Work-up not suggestive of ACS-high-sensitivity troponin negative x2 and EKG without acute  ischemic changes.  She became bradycardic however and repeat troponins remain flat with a trending up to 7 and then trending down to 3 -She does have a prior history of PE but no clinical signs of DVT on exam. -Unable to order CT angiogram due to contrast allergy (anaphylaxis). -CheckedD-dimer level and was 0.39 but since it was notelevated will notstart heparin for anticoagulation  -If Respiratory Status not improving mayobtain VQ scan in the morningand Pulmonary evaluation but will hold off for now -Cardiology was consulted due to her chest pain feel it occurs  when the patient has been coughing and thinks it is secondary to chest wall tenderness from coughing. -Follow-up in outpatient setting with pulmonary and very low suspicion for PE given normal D-dimer and resolution of her chest pain  Hypophosphatemia -Patient's phosphorus level was 2.3 -Replete with p.o. Phos-NAK -Continue to Montior and Replete as Necessary -Repeat Phos Level in the AM   Emesis, improving -LFTs normal.No complaints of abdominal pain or diarrhea. -Abdominal exam benign. Covid and influenza PCR negative.No active emesis at this time. -IV fluid hydrationwith NS at 125 mL/hr -C/w Antiemeticwith Ondansetron 4 mg IV q6has needed. -Urine pregnancy test pending. Bing Matter a hazy appearance with 50 glucose, small hemoglobin, 5 ketones, rare bacteria, 6-10 RBCs per high-power field, 11-20 WBCsand urine culture showed multiple species and suggested recollection -She has not had any more emesis at all  Hypokalemia -Patient's K+ was 3.4 and improved to 4.5 -Mag Level was 2.4 -Monitor potassium and magnesium levels, replenish as needed. -Repeat CMP in the AM  Type 2DiabetesMellitus  -Not on any medications. -Check A1c.Last HbA1c was 6.2 -Initiated Sensitive Novolog Sliding scale insulin ACHSas she is being treated with steroids. -CBGs were little elevated with blood sugars range from 240-279  Hypertension -Blood pressure stable but a little elevated prior to discharge Not on any antihypertensives at home. -Continue to monitorBP per Protocol  -Last BP was 147/129  Hypophosphatemia -Patient's phosphorus level was 2.3 -Replete prior to discharge -Repeat phosphorus level outpatient  Normocytic Anemia -Patient's Hgb/Hct on Admission was 10.6/34.5->10.6/36.3 -> 10.4/34.9 -> today it is 10.3/35.8 and stable -Checked Anemia Panel  and it showed an iron level of 29, U IBC 335, TIBC of 364, saturation ratios of 8%, ferritin level 16, folate level  4.4, vitamin B12 260 We will supplement folic acid with 1 mg folic acid daily -Continue to Monitor for S/Sx of bleeding; currently no overt bleeding noted  -Repeat CBC in the AM   Super Morbid Obesity -Complicates overall prognosis and care -Estimated body mass index is 54.4 kg/m as calculated from the following:   Height as of this encounter: 5\' 3"  (1.6 m).   Weight as of this encounter: 139.3 kg. -Weight Loss and Dietary Counseling given  Discharge Instructions  Discharge Instructions    Call MD for:  difficulty breathing, headache or visual disturbances   Complete by: As directed    Call MD for:  extreme fatigue   Complete by: As directed    Call MD for:  hives   Complete by: As directed    Call MD for:  persistant dizziness or light-headedness   Complete by: As directed    Call MD for:  persistant nausea and vomiting   Complete by: As directed    Call MD for:  redness, tenderness, or signs of infection (pain, swelling, redness, odor or green/yellow discharge around incision site)   Complete by: As directed    Call MD for:  severe uncontrolled pain  Complete by: As directed    Call MD for:  temperature >100.4   Complete by: As directed    Diet - low sodium heart healthy   Complete by: As directed    Discharge instructions   Complete by: As directed    You were cared for by a hospitalist during your hospital stay. If you have any questions about your discharge medications or the care you received while you were in the hospital after you are discharged, you can call the unit and ask to speak with the hospitalist on call if the hospitalist that took care of you is not available. Once you are discharged, your primary care physician will handle any further medical issues. Please note that NO REFILLS for any discharge medications will be authorized once you are discharged, as it is imperative that you return to your primary care physician (or establish a relationship with a  primary care physician if you do not have one) for your aftercare needs so that they can reassess your need for medications and monitor your lab values.  Follow up with PCP and Pulmonary within 1-2 weeks. Take all medications as prescribed. If symptoms change or worsen please return to the ED for evaluation   Increase activity slowly   Complete by: As directed      Allergies as of 03/21/2021      Reactions   Contrast Media [iodinated Diagnostic Agents] Anaphylaxis   Dilaudid [hydromorphone] Anaphylaxis   Tramadol Itching   Apple Hives   Banana Hives   Coconut Oil Hives, Itching   Dexamethasone Nausea And Vomiting, Hives   "feels like my pubic hair is on fire"   Other Hives   Zucchini and squash   Solu-medrol [methylprednisolone] Nausea And Vomiting   Toradol [ketorolac Tromethamine] Hives, Itching      Medication List    TAKE these medications   acetaminophen 500 MG tablet Commonly known as: TYLENOL Take 500 mg by mouth every 6 (six) hours as needed for mild pain.   albuterol 108 (90 Base) MCG/ACT inhaler Commonly known as: VENTOLIN HFA Inhale 1 puff into the lungs 2 (two) times daily as needed for wheezing or shortness of breath.   ALPRAZolam 0.5 MG tablet Commonly known as: XANAX Take 0.5 mg by mouth 2 (two) times daily.   ARIPiprazole 2 MG tablet Commonly known as: ABILIFY Take 2 mg by mouth daily.   aspirin EC 81 MG tablet Take 81 mg by mouth daily. Swallow whole.   brexpiprazole 2 MG Tabs tablet Commonly known as: REXULTI Take 2 mg by mouth daily.   budesonide-formoterol 160-4.5 MCG/ACT inhaler Commonly known as: SYMBICORT Inhale 2 puffs into the lungs 2 (two) times daily.   busPIRone 7.5 MG tablet Commonly known as: BUSPAR Take 7.5 mg by mouth in the morning and at bedtime.   celecoxib 100 MG capsule Commonly known as: CELEBREX Take 100 mg by mouth 2 (two) times daily as needed for pain.   cyclobenzaprine 10 MG tablet Commonly known as: FLEXERIL Take  10 mg by mouth 3 (three) times daily as needed for muscle spasms.   diclofenac Sodium 1 % Gel Commonly known as: VOLTAREN Apply 2 g topically 4 (four) times daily. What changed:   when to take this  reasons to take this   EPINEPHrine 0.3 mg/0.3 mL Soaj injection Commonly known as: EPI-PEN Inject 0.3 mg into the muscle as needed for anaphylaxis.   ferrous sulfate 325 (65 FE) MG EC tablet Take 325 mg by  mouth 2 (two) times daily with a meal.   folic acid 1 MG tablet Commonly known as: FOLVITE Take 1 tablet (1 mg total) by mouth daily. Start taking on: March 22, 2021   gabapentin 300 MG capsule Commonly known as: NEURONTIN Take 300 mg by mouth 3 (three) times daily.   guaiFENesin 600 MG 12 hr tablet Commonly known as: MUCINEX Take 1 tablet (600 mg total) by mouth 2 (two) times daily for 5 days.   ipratropium-albuterol 0.5-2.5 (3) MG/3ML Soln Commonly known as: DUONEB Take 3 mLs by nebulization in the morning, at noon, and at bedtime.   Latuda 20 MG Tabs tablet Generic drug: lurasidone Take 20 mg by mouth daily.   MULTIVITAMIN ADULT PO Take 1 tablet by mouth daily.   multivitamin with minerals Tabs tablet Take 1 tablet by mouth daily.   oxyCODONE-acetaminophen 10-325 MG tablet Commonly known as: PERCOCET Take 1 tablet by mouth 3 (three) times daily as needed for severe pain.   predniSONE 10 MG (21) Tbpk tablet Commonly known as: STERAPRED UNI-PAK 21 TAB Take 6 tablets on day 1, 5 tablets on day 2, 4 tablets on day 3, 3 tablets on day 4, 2 tablets on day 5, 1 tablet on day 6 and then stop on day 7   senna-docusate 8.6-50 MG tablet Commonly known as: Senokot-S Take 1 tablet by mouth at bedtime.   sertraline 50 MG tablet Commonly known as: ZOLOFT Take 50 mg by mouth daily.   sucralfate 1 GM/10ML suspension Commonly known as: CARAFATE Take 1 g by mouth 2 (two) times daily before a meal.   traZODone 100 MG tablet Commonly known as: DESYREL Take 200 mg by mouth  at bedtime.       Follow-up Information    [facility]. Call.   Specialty: Internal Medicine Why: Follow up within 1-2 weeks Contact information: [address] [place] [id number] [phone]        Nahser, Deloris PingPhilip J, MD Follow up.   Specialty: Cardiology Why: Follow up within 1-2 weeks Contact information: [address] [place] [address] [phone]        [facility]. Call.   Specialty: Pulmonology Why: Follow up within 1-2 weeks Contact information: [address] [place] [phone]       [facility]. Call.   Why: Follow up for Hemorrhoidal Bleeding  Contact information: [address] [place] [address] [phone]              Allergies  Allergen Reactions  . Contrast Media [Iodinated Diagnostic Agents] Anaphylaxis  . Dilaudid [Hydromorphone] Anaphylaxis  . Tramadol Itching  . Apple Hives  . Banana Hives  . Coconut Oil Hives and Itching  . Dexamethasone Nausea And Vomiting and Hives    "feels like my pubic hair is on fire"  . Other Hives    Zucchini and squash  . Solu-Medrol [Methylprednisolone] Nausea And Vomiting  . Toradol [Ketorolac Tromethamine] Hives and Itching    Consultations: Cardiology  Procedures/Studies: DG Chest 2 View  Result Date: 03/07/2021 CLINICAL DATA:  Shortness of breath, chest pain EXAM: CHEST - 2 VIEW COMPARISON:  Radiograph 07/23/2020 FINDINGS: Patient body habitus result in increased attenuation towards the lung bases. No convincing consolidative process is evident accounting for body habitus and atelectatic changes. Stable cardiomediastinal contours. No pneumothorax or visible effusion. No other acute osseous or soft tissue abnormality. Telemetry leads overlie  the chest. IMPRESSION: Study limited by body habitus. Basilar atelectasis without other acute cardiopulmonary abnormality.  Electronically Signed   By: Kreg Shropshire M.D.   On: 03/07/2021 01:56   DG CHEST PORT 1 VIEW  Result Date: 03/21/2021 CLINICAL DATA:  Shortness of breath EXAM: PORTABLE CHEST 1 VIEW COMPARISON:  03/20/2021 FINDINGS: Low volume, lordotic, underpenetrated AP portable examination. There are possible small layering pleural effusions. No acute appearing airspace opacity. Cardiomegaly, likely exaggerated by technique. IMPRESSION: Low volume, lordotic, underpenetrated AP portable examination. There are possible small layering pleural effusions. No acute appearing airspace opacity. PA and lateral radiographs may be helpful to further evaluate. Electronically Signed   By: Lauralyn Primes M.D.   On: 03/21/2021 08:30   DG CHEST PORT 1 VIEW  Result Date: 03/20/2021 CLINICAL DATA:  Shortness of breath. EXAM: PORTABLE CHEST 1 VIEW COMPARISON:  03/18/2021. FINDINGS: Borderline cardiomegaly and pulmonary venous congestion. Low lung volumes. Low lung volumes with mild bibasilar atelectasis. No prominent pleural effusion. No pneumothorax. No acute bony abnormality identified. IMPRESSION: 1.  Borderline cardiomegaly and pulmonary venous congestion. 2.  Low lung volumes with mild bibasilar atelectasis. Electronically Signed   By: Maisie Fus  Register   On: 03/20/2021 05:45   DG Chest Port 1 View  Result Date: 03/18/2021 CLINICAL DATA:  Asthma, shortness of breath, vomiting, and cough. EXAM: PORTABLE CHEST 1 VIEW COMPARISON:  03/07/2021 FINDINGS: Shallow inspiration. Atelectasis in the lung bases. Heart size and pulmonary vascularity are likely normal for technique. No apparent pleural effusion or pneumothorax. Mediastinal contours appear intact. Similar appearance to prior study. IMPRESSION: Shallow inspiration with atelectasis in the lung bases. Electronically Signed   By: Burman Nieves M.D.   On: 03/18/2021 21:33     Subjective: Seen and examined at bedside and she is back to her baseline denies any chest pain.  Shortness  breath is improved and she is no longer wheezing.  She denies any lightheadedness or dizziness.  No other concerns up at this time.  Ready to be discharged.  Discharge Exam: Vitals:   03/21/21 1314 03/21/21 1433  BP: (!) 147/129   Pulse: 86   Resp: (!) 22   Temp: 98 F (36.7 C)   SpO2: 100% 95%   Vitals:   03/21/21 0512 03/21/21 0742 03/21/21 1314 03/21/21 1433  BP: 120/63  (!) 147/129   Pulse: (!) 52  86   Resp:   (!) 22   Temp: 97.8 F (36.6 C)  98 F (36.7 C)   TempSrc: Axillary  Oral   SpO2: 100% 98% 100% 95%  Weight:      Height:       General: Pt is a little sleepy but easily awoken, not in acute distress Cardiovascular: RRR, S1/S2 +, no rubs, no gallops Respiratory: Diminished bilaterally, no wheezing, no rhonchi; wearing supplemental oxygen via nasal cannula Abdominal: Soft, NT, distended secondary body habitus, bowel sounds + Extremities: Trace edema, no cyanosis  The results of significant diagnostics from this hospitalization (including imaging, microbiology, ancillary and laboratory) are listed below for reference.     Microbiology: Recent Results (from the past 240 hour(s))  Resp Panel by RT-PCR (Flu A&B, Covid) Nasopharyngeal Swab     Status: None   Collection Time: 03/18/21  8:20 PM   Specimen: Nasopharyngeal Swab; Nasopharyngeal(NP) swabs in vial transport medium  Result Value Ref Range Status   SARS Coronavirus 2 by RT PCR NEGATIVE NEGATIVE Final    Comment: (NOTE) SARS-CoV-2 target nucleic acids are NOT DETECTED.  The  SARS-CoV-2 RNA is generally detectable in upper respiratory specimens during the acute phase of infection. The lowest concentration of SARS-CoV-2 viral copies this assay can detect is 138 copies/mL. A negative result does not preclude SARS-Cov-2 infection and should not be used as the sole basis for treatment or other patient management decisions. A negative result may occur with  improper specimen collection/handling, submission of  specimen other than nasopharyngeal swab, presence of viral mutation(s) within the areas targeted by this assay, and inadequate number of viral copies(<138 copies/mL). A negative result must be combined with clinical observations, patient history, and epidemiological information. The expected result is Negative.  Fact Sheet for Patients:  BloggerCourse.com  Fact Sheet for Healthcare Providers:  SeriousBroker.it  This test is no t yet approved or cleared by the [place] FDA and  has been authorized for detection and/or diagnosis of SARS-CoV-2 by FDA under an Emergency Use Authorization (EUA). This EUA will remain  in effect (meaning this test can be used) for the duration of the COVID-19 declaration under Section 564(b)(1) of the Act, 21 U.S.C.section 360bbb-3(b)(1), unless the authorization is terminated  or revoked sooner.       Influenza A by PCR NEGATIVE NEGATIVE Final   Influenza B by PCR NEGATIVE NEGATIVE Final    Comment: (NOTE) The Xpert Xpress SARS-CoV-2/FLU/RSV plus assay is intended as an aid in the diagnosis of influenza from Nasopharyngeal swab specimens and should not be used as a sole basis for treatment. Nasal washings and aspirates are unacceptable for Xpert Xpress SARS-CoV-2/FLU/RSV testing.  Fact Sheet for Patients: BloggerCourse.com  Fact Sheet for Healthcare Providers: SeriousBroker.it  This test is not yet approved or cleared by the [place] FDA and has been authorized for detection and/or diagnosis of SARS-CoV-2 by FDA under an Emergency Use Authorization (EUA). This EUA will remain in effect (meaning this test can be used) for the duration of the COVID-19 declaration under Section 564(b)(1) of the Act, 21 U.S.C. section 360bbb-3(b)(1), unless the authorization is terminated or revoked.  Performed at [facility], [address] [place], [place] [address]   Blood Culture (routine x 2)     Status: None (Preliminary result)   Collection Time: 03/18/21  8:20 PM   Specimen: BLOOD  Result Value Ref Range Status   Specimen Description   Final    BLOOD RIGHT ANTECUBITAL Performed at [facility], [address] [place], [place] [address]    Special Requests   Final    BOTTLES DRAWN AEROBIC AND ANAEROBIC Blood Culture adequate volume Performed at [facility], [address] [place], [place] [address]    Culture   Final    NO GROWTH 2 DAYS Performed at [facility], [address] [place], [place] [address]    Report Status PENDING  Incomplete  Group A Strep by PCR     Status: None   Collection Time: 03/18/21  8:20 PM   Specimen: Nasopharyngeal Swab; Sterile Swab  Result Value Ref Range Status   Group A Strep by PCR NOT DETECTED NOT DETECTED Final    Comment: Performed at [facility] [facility], [address] [place], [place] [address]  Blood Culture (routine x 2)     Status: None (Preliminary result)   Collection Time: 03/18/21  8:24 PM   Specimen: BLOOD  Result Value Ref Range Status   Specimen Description   Final    BLOOD LEFT ANTECUBITAL Performed at  [facility], [address] [place], [place] [address]    Special Requests   Final    BOTTLES DRAWN AEROBIC AND ANAEROBIC Blood Culture adequate volume Performed at [facility], [address] [place], [place] [address]    Culture   Final    NO GROWTH 2 DAYS Performed at [facility], [address] [place], [place] [address]    Report Status PENDING  Incomplete  Urine culture     Status: Abnormal   Collection Time: 03/19/21  7:22 AM   Specimen: In/Out Cath Urine  Result Value Ref Range Status   Specimen Description   Final    IN/OUT CATH URINE Performed at [facility], [address], [place], [place] [address]    Special Requests   Final    NONE Performed at  [facility], [address] [place], [place] [address]    Culture MULTIPLE SPECIES PRESENT, SUGGEST RECOLLECTION (A)  Final   Report Status 03/20/2021 FINAL  Final  Respiratory (~20 pathogens) panel by PCR     Status: Abnormal   Collection Time: 03/19/21 10:12 AM   Specimen: Nasopharyngeal Swab; Respiratory  Result Value Ref Range Status   Adenovirus NOT DETECTED NOT DETECTED Final   Coronavirus 229E NOT DETECTED NOT DETECTED Final    Comment: (NOTE) The Coronavirus on the Respiratory Panel, DOES NOT test for the novel  Coronavirus (2019 nCoV)    Coronavirus HKU1 NOT DETECTED NOT DETECTED Final   Coronavirus NL63 NOT DETECTED NOT DETECTED Final   Coronavirus OC43 DETECTED (A) NOT DETECTED Final   Metapneumovirus NOT DETECTED NOT DETECTED Final   Rhinovirus / Enterovirus NOT DETECTED NOT DETECTED Final   Influenza A NOT DETECTED NOT DETECTED Final   Influenza B NOT DETECTED NOT DETECTED Final   Parainfluenza Virus 1 NOT DETECTED NOT DETECTED Final   Parainfluenza Virus 2 NOT DETECTED NOT DETECTED Final   Parainfluenza Virus 3 NOT DETECTED NOT DETECTED Final   Parainfluenza Virus 4 NOT DETECTED NOT DETECTED Final   Respiratory Syncytial Virus NOT DETECTED NOT DETECTED Final   Bordetella pertussis NOT DETECTED NOT DETECTED Final   Bordetella Parapertussis NOT DETECTED NOT DETECTED Final   Chlamydophila pneumoniae NOT DETECTED NOT DETECTED Final   Mycoplasma pneumoniae NOT DETECTED NOT DETECTED Final    Comment: Performed at [facility], [address], [place], [place] [address]     Labs: BNP (last 3 results) No results for input(s): BNP in the last 8760 hours. Basic Metabolic Panel: Recent Labs  Lab 03/18/21 2020 03/19/21 0344 03/20/21 0356 03/21/21 0416  NA 141 145 139 142  K 3.3* 3.4*  3.4* 4.8 4.5  CL 106 111 109 110  CO2 GLUCOSE 113* 227* 244* 247*  BUN CREATININE 0.63 0.68 0.64 0.69  CALCIUM 8.6* 8.8* 8.8* 9.1   MG  --  2.2  2.2 2.2 2.4  PHOS  --  2.5 2.0* 2.3*   Liver Function Tests: Recent Labs  Lab 03/18/21 2020 03/19/21 0344 03/20/21 0356 03/21/21 0416  AST 24 27 35 17  ALT ALKPHOS 59 57 58 51  BILITOT 0.1* 0.2* 0.4 0.4  PROT 6.9 7.2 7.0 6.9  ALBUMIN 3.1* 3.4* 3.3* 3.3*   No results for input(s): LIPASE, AMYLASE in the last 168 hours. No results for input(s): AMMONIA in the last 168 hours. CBC: Recent  Labs  Lab 03/18/21 2020 03/19/21 0344 03/20/21 0356 03/21/21 0416  WBC 7.8 7.4 14.1* 13.1*  NEUTROABS 4.4 6.7 12.2* 10.9*  HGB 10.6* 10.6* 10.4* 10.3*  HCT 34.5* 36.3 34.9* 35.8*  MCV 88.9 92.6 93.3 94.7  PLT 387 410* 433* 406*   Cardiac Enzymes: No results for input(s): CKTOTAL, CKMB, CKMBINDEX, TROPONINI in the last 168 hours. BNP: Invalid input(s): POCBNP CBG: Recent Labs  Lab 03/20/21 0722 03/20/21 2205 03/21/21 0724 03/21/21 1319 03/21/21 1655  GLUCAP 199* 273* 276* 240* 279*   D-Dimer Recent Labs    03/19/21 0344  DDIMER 0.39   Hgb A1c Recent Labs    03/21/21 0416  HGBA1C 6.2*   Lipid Profile No results for input(s): CHOL, HDL, LDLCALC, TRIG, CHOLHDL, LDLDIRECT in the last 72 hours. Thyroid function studies No results for input(s): TSH, T4TOTAL, T3FREE, THYROIDAB in the last 72 hours.  Invalid input(s): FREET3 Anemia work up Recent Labs    03/21/21 0416  VITAMINB12 260  FOLATE 4.4*  FERRITIN 16  TIBC 364  IRON 29  RETICCTPCT 1.7   Urinalysis    Component Value Date/Time   COLORURINE YELLOW 03/19/2021 0722   APPEARANCEUR HAZY (A) 03/19/2021 0722   LABSPEC 1.028 03/19/2021 0722   PHURINE 5.0 03/19/2021 0722   GLUCOSEU 50 (A) 03/19/2021 0722   HGBUR SMALL (A) 03/19/2021 0722   BILIRUBINUR NEGATIVE 03/19/2021 0722   KETONESUR 5 (A) 03/19/2021 0722   PROTEINUR NEGATIVE 03/19/2021 0722   UROBILINOGEN 0.2 12/04/2009 1301   NITRITE NEGATIVE 03/19/2021 0722   LEUKOCYTESUR NEGATIVE 03/19/2021 0722   Sepsis Labs Invalid  input(s): PROCALCITONIN,  WBC,  LACTICIDVEN Microbiology Recent Results (from the past 240 hour(s))  Resp Panel by RT-PCR (Flu A&B, Covid) Nasopharyngeal Swab     Status: None   Collection Time: 03/18/21  8:20 PM   Specimen: Nasopharyngeal Swab; Nasopharyngeal(NP) swabs in vial transport medium  Result Value Ref Range Status   SARS Coronavirus 2 by RT PCR NEGATIVE NEGATIVE Final    Comment: (NOTE) SARS-CoV-2 target nucleic acids are NOT DETECTED.  The SARS-CoV-2 RNA is generally detectable in upper respiratory specimens during the acute phase of infection. The lowest concentration of SARS-CoV-2 viral copies this assay can detect is 138 copies/mL. A negative result does not preclude SARS-Cov-2 infection and should not be used as the sole basis for treatment or other patient management decisions. A negative result may occur with  improper specimen collection/handling, submission of specimen other than nasopharyngeal swab, presence of viral mutation(s) within the areas targeted by this assay, and inadequate number of viral copies(<138 copies/mL). A negative result must be combined with clinical observations, patient history, and epidemiological information. The expected result is Negative.  Fact Sheet for Patients:  BloggerCourse.com  Fact Sheet for Healthcare Providers:  SeriousBroker.it  This test is no t yet approved or cleared by the [place] FDA and  has been authorized for detection and/or diagnosis of SARS-CoV-2 by FDA under an Emergency Use Authorization (EUA). This EUA will remain  in effect (meaning this test can be used) for the duration of the COVID-19 declaration under Section 564(b)(1) of the Act, 21 U.S.C.section 360bbb-3(b)(1), unless the authorization is terminated  or revoked sooner.       Influenza A by PCR NEGATIVE NEGATIVE Final   Influenza B by PCR NEGATIVE NEGATIVE Final    Comment: (NOTE) The Xpert  Xpress SARS-CoV-2/FLU/RSV plus assay is intended as an aid in the diagnosis of influenza from Nasopharyngeal swab specimens and should not  be used as a sole basis for treatment. Nasal washings and aspirates are unacceptable for Xpert Xpress SARS-CoV-2/FLU/RSV testing.  Fact Sheet for Patients: BloggerCourse.com  Fact Sheet for Healthcare Providers: SeriousBroker.it  This test is not yet approved or cleared by the [place] FDA and has been authorized for detection and/or diagnosis of SARS-CoV-2 by FDA under an Emergency Use Authorization (EUA). This EUA will remain in effect (meaning this test can be used) for the duration of the COVID-19 declaration under Section 564(b)(1) of the Act, 21 U.S.C. section 360bbb-3(b)(1), unless the authorization is terminated or revoked.  Performed at [facility], [address] [place], [place] [address]   Blood Culture (routine x 2)     Status: None (Preliminary result)   Collection Time: 03/18/21  8:20 PM   Specimen: BLOOD  Result Value Ref Range Status   Specimen Description   Final    BLOOD RIGHT ANTECUBITAL Performed at [facility], [address] [place], [place] [address]    Special Requests   Final    BOTTLES DRAWN AEROBIC AND ANAEROBIC Blood Culture adequate volume Performed at [facility], [address] [place], [place] [address]    Culture   Final    NO GROWTH 2 DAYS Performed at [facility], [address] [place], [place] [address]    Report Status PENDING  Incomplete  Group A Strep by PCR     Status: None   Collection Time: 03/18/21  8:20 PM   Specimen: Nasopharyngeal Swab; Sterile Swab  Result Value Ref Range Status   Group A Strep by PCR NOT DETECTED NOT DETECTED Final    Comment: Performed at [facility], [address] [place], [place] [address]  Blood Culture (routine x 2)     Status: None (Preliminary  result)   Collection Time: 03/18/21  8:24 PM   Specimen: BLOOD  Result Value Ref Range Status   Specimen Description   Final    BLOOD LEFT ANTECUBITAL Performed at [facility], [address] [place], [place] [address]    Special Requests   Final    BOTTLES DRAWN AEROBIC AND ANAEROBIC Blood Culture adequate volume Performed at [facility], [address] [place], [place] [address]    Culture   Final    NO GROWTH 2 DAYS Performed at [facility], [address] [place], [place] [address]    Report Status PENDING  Incomplete  Urine culture     Status: Abnormal   Collection Time: 03/19/21  7:22 AM   Specimen: In/Out Cath Urine  Result Value Ref Range Status   Specimen Description   Final    IN/OUT CATH URINE Performed at [facility], [address] [place], [place] [address]    Special Requests   Final    NONE Performed at [facility], [address], [place], [place] [address]    Culture MULTIPLE SPECIES PRESENT, SUGGEST RECOLLECTION (A)  Final   Report Status 03/20/2021 FINAL  Final  Respiratory (~20 pathogens) panel by PCR     Status: Abnormal   Collection Time: 03/19/21 10:12 AM   Specimen: Nasopharyngeal Swab; Respiratory  Result Value Ref Range Status   Adenovirus NOT DETECTED NOT DETECTED Final   Coronavirus 229E NOT DETECTED NOT DETECTED Final    Comment: (NOTE) The Coronavirus on the Respiratory Panel, DOES NOT test for  the novel  Coronavirus (2019 nCoV)    Coronavirus HKU1 NOT DETECTED NOT DETECTED Final   Coronavirus NL63 NOT DETECTED NOT DETECTED Final   Coronavirus OC43 DETECTED (A) NOT DETECTED Final   Metapneumovirus NOT DETECTED NOT DETECTED Final   Rhinovirus / Enterovirus NOT DETECTED NOT DETECTED Final   Influenza A NOT DETECTED NOT DETECTED Final   Influenza B NOT DETECTED NOT DETECTED Final   Parainfluenza Virus 1 NOT DETECTED NOT DETECTED Final   Parainfluenza Virus 2 NOT  DETECTED NOT DETECTED Final   Parainfluenza Virus 3 NOT DETECTED NOT DETECTED Final   Parainfluenza Virus 4 NOT DETECTED NOT DETECTED Final   Respiratory Syncytial Virus NOT DETECTED NOT DETECTED Final   Bordetella pertussis NOT DETECTED NOT DETECTED Final   Bordetella Parapertussis NOT DETECTED NOT DETECTED Final   Chlamydophila pneumoniae NOT DETECTED NOT DETECTED Final   Mycoplasma pneumoniae NOT DETECTED NOT DETECTED Final    Comment: Performed at [facility], [address], [place], [place] [address]   Time coordinating discharge: 35 minutes  SIGNED:  Merlene Laughter, DO [facility] 03/21/2021, 7:21 PM Pager is on AMION  If 7PM-7AM, please contact night-coverage [email or web address]

## 2021-03-21 NOTE — Plan of Care (Signed)
Plan of care reviewed with pt. Pt on baseline oxygen requirements at this time. Discharge education completed with pt and pt verbalizes understanding and cooperation. All pt belongings taken with pt at discharge. Pt to discharge to home via personal vehicle.   Problem: Education: Goal: Knowledge of General Education information will improve Description: Including pain rating scale, medication(s)/side effects and non-pharmacologic comfort measures Outcome: Adequate for Discharge   Problem: Activity: Goal: Risk for activity intolerance will decrease Outcome: Adequate for Discharge   Problem: Nutrition: Goal: Adequate nutrition will be maintained Outcome: Adequate for Discharge   Problem: Elimination: Goal: Will not experience complications related to bowel motility Outcome: Adequate for Discharge Goal: Will not experience complications related to urinary retention Outcome: Adequate for Discharge   Problem: Pain Managment: Goal: General experience of comfort will improve Outcome: Adequate for Discharge   Problem: Safety: Goal: Ability to remain free from injury will improve Outcome: Adequate for Discharge   Problem: Skin Integrity: Goal: Risk for impaired skin integrity will decrease Outcome: Adequate for Discharge

## 2021-03-23 ENCOUNTER — Encounter (HOSPITAL_COMMUNITY): Payer: Self-pay | Admitting: Emergency Medicine

## 2021-03-23 ENCOUNTER — Emergency Department (HOSPITAL_COMMUNITY): Payer: Medicaid Other

## 2021-03-23 ENCOUNTER — Inpatient Hospital Stay (HOSPITAL_COMMUNITY)
Admission: EM | Admit: 2021-03-23 | Discharge: 2021-03-27 | DRG: 177 | Disposition: A | Discharge status: Home / Self Care | Payer: Medicaid Other | Attending: Internal Medicine | Admitting: Internal Medicine

## 2021-03-23 DIAGNOSIS — Z7982 Long term (current) use of aspirin: Secondary | ICD-10-CM

## 2021-03-23 DIAGNOSIS — E861 Hypovolemia: Secondary | ICD-10-CM | POA: Diagnosis present

## 2021-03-23 DIAGNOSIS — Z86718 Personal history of other venous thrombosis and embolism: Secondary | ICD-10-CM | POA: Diagnosis not present

## 2021-03-23 DIAGNOSIS — J069 Acute upper respiratory infection, unspecified: Secondary | ICD-10-CM | POA: Diagnosis not present

## 2021-03-23 DIAGNOSIS — G4733 Obstructive sleep apnea (adult) (pediatric): Secondary | ICD-10-CM | POA: Diagnosis not present

## 2021-03-23 DIAGNOSIS — E872 Acidosis: Secondary | ICD-10-CM | POA: Diagnosis present

## 2021-03-23 DIAGNOSIS — Z7951 Long term (current) use of inhaled steroids: Secondary | ICD-10-CM

## 2021-03-23 DIAGNOSIS — E876 Hypokalemia: Secondary | ICD-10-CM | POA: Diagnosis present

## 2021-03-23 DIAGNOSIS — F4310 Post-traumatic stress disorder, unspecified: Secondary | ICD-10-CM | POA: Diagnosis present

## 2021-03-23 DIAGNOSIS — F319 Bipolar disorder, unspecified: Secondary | ICD-10-CM | POA: Diagnosis present

## 2021-03-23 DIAGNOSIS — I129 Hypertensive chronic kidney disease with stage 1 through stage 4 chronic kidney disease, or unspecified chronic kidney disease: Secondary | ICD-10-CM | POA: Diagnosis present

## 2021-03-23 DIAGNOSIS — Z825 Family history of asthma and other chronic lower respiratory diseases: Secondary | ICD-10-CM | POA: Diagnosis not present

## 2021-03-23 DIAGNOSIS — Z6841 Body Mass Index (BMI) 40.0 and over, adult: Secondary | ICD-10-CM | POA: Diagnosis not present

## 2021-03-23 DIAGNOSIS — D631 Anemia in chronic kidney disease: Secondary | ICD-10-CM | POA: Diagnosis present

## 2021-03-23 DIAGNOSIS — Z7952 Long term (current) use of systemic steroids: Secondary | ICD-10-CM | POA: Diagnosis not present

## 2021-03-23 DIAGNOSIS — E1122 Type 2 diabetes mellitus with diabetic chronic kidney disease: Secondary | ICD-10-CM | POA: Diagnosis present

## 2021-03-23 DIAGNOSIS — A0839 Other viral enteritis: Secondary | ICD-10-CM | POA: Diagnosis present

## 2021-03-23 DIAGNOSIS — Z833 Family history of diabetes mellitus: Secondary | ICD-10-CM

## 2021-03-23 DIAGNOSIS — Z79899 Other long term (current) drug therapy: Secondary | ICD-10-CM

## 2021-03-23 DIAGNOSIS — I959 Hypotension, unspecified: Secondary | ICD-10-CM | POA: Diagnosis present

## 2021-03-23 DIAGNOSIS — J9621 Acute and chronic respiratory failure with hypoxia: Secondary | ICD-10-CM | POA: Diagnosis present

## 2021-03-23 DIAGNOSIS — J45901 Unspecified asthma with (acute) exacerbation: Secondary | ICD-10-CM | POA: Diagnosis present

## 2021-03-23 DIAGNOSIS — Z91018 Allergy to other foods: Secondary | ICD-10-CM

## 2021-03-23 DIAGNOSIS — Z28311 Partially vaccinated for covid-19: Secondary | ICD-10-CM

## 2021-03-23 DIAGNOSIS — I9589 Other hypotension: Secondary | ICD-10-CM | POA: Diagnosis not present

## 2021-03-23 DIAGNOSIS — U071 COVID-19: Secondary | ICD-10-CM | POA: Diagnosis present

## 2021-03-23 DIAGNOSIS — J4551 Severe persistent asthma with (acute) exacerbation: Secondary | ICD-10-CM | POA: Diagnosis present

## 2021-03-23 DIAGNOSIS — N182 Chronic kidney disease, stage 2 (mild): Secondary | ICD-10-CM | POA: Diagnosis present

## 2021-03-23 DIAGNOSIS — Z86711 Personal history of pulmonary embolism: Secondary | ICD-10-CM | POA: Diagnosis not present

## 2021-03-23 DIAGNOSIS — Z888 Allergy status to other drugs, medicaments and biological substances status: Secondary | ICD-10-CM

## 2021-03-23 DIAGNOSIS — Z885 Allergy status to narcotic agent status: Secondary | ICD-10-CM

## 2021-03-23 LAB — CBC WITH DIFFERENTIAL/PLATELET
Abs Immature Granulocytes: 0.17 10*3/uL — ABNORMAL HIGH (ref 0.00–0.07)
Basophils Absolute: 0 10*3/uL (ref 0.0–0.1)
Basophils Relative: 0 %
Eosinophils Absolute: 0.1 10*3/uL (ref 0.0–0.5)
Eosinophils Relative: 1 %
HCT: 37.5 % (ref 36.0–46.0)
Hemoglobin: 11.3 g/dL — ABNORMAL LOW (ref 12.0–15.0)
Immature Granulocytes: 1 %
Lymphocytes Relative: 41 %
Lymphs Abs: 5 10*3/uL — ABNORMAL HIGH (ref 0.7–4.0)
MCH: 27.8 pg (ref 26.0–34.0)
MCHC: 30.1 g/dL (ref 30.0–36.0)
MCV: 92.1 fL (ref 80.0–100.0)
Monocytes Absolute: 0.4 10*3/uL (ref 0.1–1.0)
Monocytes Relative: 3 %
Neutro Abs: 6.5 10*3/uL (ref 1.7–7.7)
Neutrophils Relative %: 54 %
Platelets: 395 10*3/uL (ref 150–400)
RBC: 4.07 MIL/uL (ref 3.87–5.11)
RDW: 15.4 % (ref 11.5–15.5)
WBC: 12.2 10*3/uL — ABNORMAL HIGH (ref 4.0–10.5)
nRBC: 0.2 % (ref 0.0–0.2)

## 2021-03-23 LAB — LACTIC ACID, PLASMA
Lactic Acid, Venous: 2.7 mmol/L (ref 0.5–1.9)
Lactic Acid, Venous: 2.5 mmol/L (ref 0.5–1.9)

## 2021-03-23 LAB — RESPIRATORY PANEL BY PCR

## 2021-03-23 LAB — COMPREHENSIVE METABOLIC PANEL
ALT: 51 U/L — ABNORMAL HIGH (ref 0–44)
AST: 24 U/L (ref 15–41)
Albumin: 3.2 g/dL — ABNORMAL LOW (ref 3.5–5.0)
Alkaline Phosphatase: 70 U/L (ref 38–126)
Anion gap: 9 (ref 5–15)
BUN: 21 mg/dL — ABNORMAL HIGH (ref 6–20)
CO2: 30 mmol/L (ref 22–32)
Calcium: 8.5 mg/dL — ABNORMAL LOW (ref 8.9–10.3)
Chloride: 103 mmol/L (ref 98–111)
Creatinine, Ser: 0.8 mg/dL (ref 0.44–1.00)
GFR, Estimated: >60 mL/min (ref >60)
Glucose, Bld: 170 mg/dL — ABNORMAL HIGH (ref 70–99)
Potassium: 3.2 mmol/L — ABNORMAL LOW (ref 3.5–5.1)
Sodium: 142 mmol/L (ref 135–145)
Total Bilirubin: 0.3 mg/dL (ref 0.3–1.2)
Total Protein: 6.6 g/dL (ref 6.5–8.1)

## 2021-03-23 LAB — RESP PANEL BY RT-PCR (FLU A&B, COVID) ARPGX2
Influenza A by PCR: NEGATIVE
Influenza B by PCR: NEGATIVE
SARS Coronavirus 2 by RT PCR: POSITIVE — AB

## 2021-03-23 LAB — D-DIMER, QUANTITATIVE: D-Dimer, Quant: 0.49 ug/mL-FEU (ref 0.00–0.50)

## 2021-03-23 LAB — TROPONIN I (HIGH SENSITIVITY)
Troponin I (High Sensitivity): 3 ng/L (ref <18)
Troponin I (High Sensitivity): 3 ng/L (ref <18)

## 2021-03-23 IMAGING — DX DG CHEST 1V PORT
1 series · 1 of 1 positions shown · non-contrast
Comparison: [DATE] and earlier.

CLINICAL DATA: 29-year-old with cough.

EXAM:
PORTABLE CHEST 1 VIEW

[chest ap]
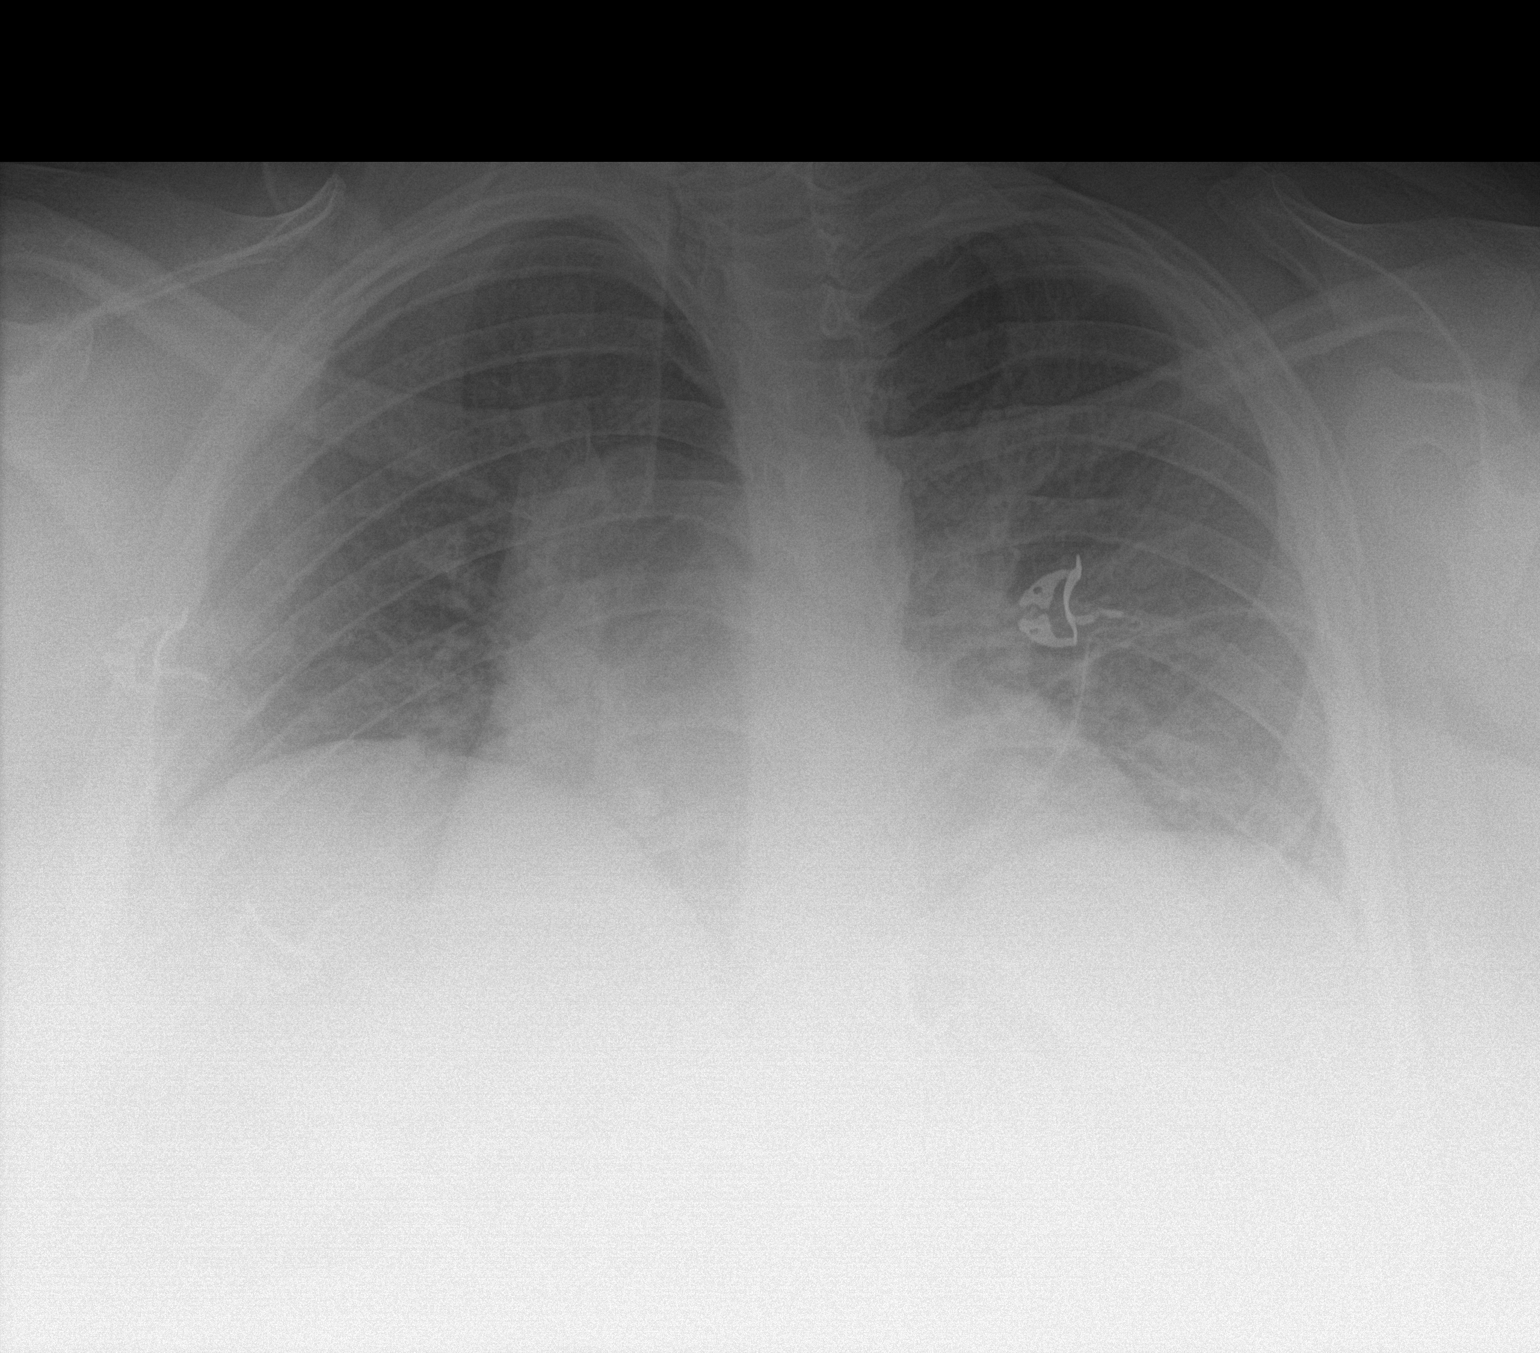

[1 of 1 positions shown; findings below may reference images not displayed]

FINDINGS: Markedly suboptimal inspiration which accounts for bibasilar
atelectasis and extension weights the cardiac silhouette. Taking
this into account, cardiac silhouette likely upper normal in size to
mildly enlarged. Lungs otherwise clear. Pulmonary vascularity
normal.
IMPRESSION: Markedly suboptimal inspiration accounts for bibasilar atelectasis.

## 2021-03-23 MED ORDER — ALPRAZOLAM 0.5 MG PO TABS
0.5000 mg | ORAL_TABLET | Freq: Two times a day (BID) | ORAL | Status: DC
  Administered 2021-03-23 – 2021-03-27 (×8): 0.5 mg via ORAL
  Filled 2021-03-23 (×8): qty 1

## 2021-03-23 MED ORDER — ENOXAPARIN SODIUM 40 MG/0.4ML ~~LOC~~ SOLN
40.0000 mg | SUBCUTANEOUS | Status: DC
  Administered 2021-03-23: 40 mg via SUBCUTANEOUS
  Filled 2021-03-23: qty 0.4

## 2021-03-23 MED ORDER — METHYLPREDNISOLONE SODIUM SUCC 40 MG IJ SOLR
40.0000 mg | INTRAMUSCULAR | Status: DC
  Administered 2021-03-24: 40 mg via INTRAVENOUS
  Filled 2021-03-23: qty 1

## 2021-03-23 MED ORDER — ASPIRIN EC 81 MG PO TBEC
81.0000 mg | DELAYED_RELEASE_TABLET | Freq: Every day | ORAL | Status: DC
  Administered 2021-03-24 – 2021-03-27 (×4): 81 mg via ORAL
  Filled 2021-03-23 (×4): qty 1

## 2021-03-23 MED ORDER — ARIPIPRAZOLE 2 MG PO TABS
2.0000 mg | ORAL_TABLET | Freq: Every day | ORAL | Status: DC
  Administered 2021-03-24 – 2021-03-27 (×4): 2 mg via ORAL
  Filled 2021-03-23 (×4): qty 1

## 2021-03-23 MED ORDER — LOPERAMIDE HCL 2 MG PO CAPS: 2.0000 mg | ORAL_CAPSULE | ORAL | Status: DC | PRN

## 2021-03-23 MED ORDER — SODIUM CHLORIDE 0.9 % IV BOLUS
500.0000 mL | Freq: Once | INTRAVENOUS | Status: AC
  Administered 2021-03-23: 500 mL via INTRAVENOUS

## 2021-03-23 MED ORDER — BUSPIRONE HCL 5 MG PO TABS
7.5000 mg | ORAL_TABLET | Freq: Two times a day (BID) | ORAL | Status: DC
  Administered 2021-03-24 – 2021-03-27 (×7): 7.5 mg via ORAL
  Filled 2021-03-23 (×5): qty 2
  Filled 2021-03-23: qty 1.5
  Filled 2021-03-23: qty 2

## 2021-03-23 MED ORDER — OXYCODONE-ACETAMINOPHEN 10-325 MG PO TABS: 1.0000 | ORAL_TABLET | Freq: Three times a day (TID) | ORAL | Status: DC | PRN

## 2021-03-23 MED ORDER — FOLIC ACID 1 MG PO TABS
1.0000 mg | ORAL_TABLET | Freq: Every day | ORAL | Status: DC
  Administered 2021-03-24 – 2021-03-27 (×4): 1 mg via ORAL
  Filled 2021-03-23 (×4): qty 1

## 2021-03-23 MED ORDER — INSULIN ASPART 100 UNIT/ML ~~LOC~~ SOLN
0.0000 [IU] | Freq: Three times a day (TID) | SUBCUTANEOUS | Status: DC
  Administered 2021-03-24: 8 [IU] via SUBCUTANEOUS
  Administered 2021-03-25: 11 [IU] via SUBCUTANEOUS
  Administered 2021-03-25: 5 [IU] via SUBCUTANEOUS
  Administered 2021-03-25: 15 [IU] via SUBCUTANEOUS
  Administered 2021-03-26: 3 [IU] via SUBCUTANEOUS
  Administered 2021-03-26: 11 [IU] via SUBCUTANEOUS
  Administered 2021-03-26: 8 [IU] via SUBCUTANEOUS
  Administered 2021-03-27: 4 [IU] via SUBCUTANEOUS
  Filled 2021-03-23: qty 0.15

## 2021-03-23 MED ORDER — ACETAMINOPHEN 500 MG PO TABS: 500.0000 mg | ORAL_TABLET | Freq: Four times a day (QID) | ORAL | Status: DC | PRN

## 2021-03-23 MED ORDER — LURASIDONE HCL 20 MG PO TABS
20.0000 mg | ORAL_TABLET | Freq: Every day | ORAL | Status: DC
  Administered 2021-03-24 – 2021-03-27 (×4): 20 mg via ORAL
  Filled 2021-03-23 (×4): qty 1

## 2021-03-23 MED ORDER — SUCRALFATE 1 GM/10ML PO SUSP
1.0000 g | Freq: Two times a day (BID) | ORAL | Status: DC
  Administered 2021-03-24 – 2021-03-27 (×7): 1 g via ORAL
  Filled 2021-03-23 (×7): qty 10

## 2021-03-23 MED ORDER — SODIUM CHLORIDE 0.9 % IV SOLN
100.0000 mg | Freq: Once | INTRAVENOUS | Status: AC
  Administered 2021-03-23: 100 mg via INTRAVENOUS
  Filled 2021-03-23: qty 20

## 2021-03-23 MED ORDER — SODIUM CHLORIDE 0.9 % IV SOLN: INTRAVENOUS | Status: AC

## 2021-03-23 MED ORDER — OXYCODONE HCL 5 MG PO TABS
5.0000 mg | ORAL_TABLET | Freq: Three times a day (TID) | ORAL | Status: DC | PRN
  Administered 2021-03-23 – 2021-03-27 (×4): 5 mg via ORAL
  Filled 2021-03-23 (×4): qty 1

## 2021-03-23 MED ORDER — GABAPENTIN 300 MG PO CAPS
300.0000 mg | ORAL_CAPSULE | Freq: Three times a day (TID) | ORAL | Status: DC
  Administered 2021-03-23 – 2021-03-27 (×11): 300 mg via ORAL
  Filled 2021-03-23 (×11): qty 1

## 2021-03-23 MED ORDER — ALBUTEROL SULFATE HFA 108 (90 BASE) MCG/ACT IN AERS
4.0000 | INHALATION_SPRAY | Freq: Once | RESPIRATORY_TRACT | Status: AC
  Administered 2021-03-23: 4 via RESPIRATORY_TRACT
  Filled 2021-03-23: qty 6.7

## 2021-03-23 MED ORDER — IPRATROPIUM-ALBUTEROL 0.5-2.5 (3) MG/3ML IN SOLN
3.0000 mL | Freq: Once | RESPIRATORY_TRACT | Status: AC
  Administered 2021-03-23: 3 mL via RESPIRATORY_TRACT
  Filled 2021-03-23: qty 3

## 2021-03-23 MED ORDER — MOMETASONE FURO-FORMOTEROL FUM 200-5 MCG/ACT IN AERO
2.0000 | INHALATION_SPRAY | Freq: Two times a day (BID) | RESPIRATORY_TRACT | Status: DC
  Administered 2021-03-24 – 2021-03-27 (×8): 2 via RESPIRATORY_TRACT
  Filled 2021-03-23: qty 8.8

## 2021-03-23 MED ORDER — IPRATROPIUM-ALBUTEROL 20-100 MCG/ACT IN AERS
1.0000 | INHALATION_SPRAY | Freq: Four times a day (QID) | RESPIRATORY_TRACT | Status: DC
  Administered 2021-03-24 – 2021-03-27 (×13): 1 via RESPIRATORY_TRACT
  Filled 2021-03-23: qty 4

## 2021-03-23 MED ORDER — TRAZODONE HCL 100 MG PO TABS
200.0000 mg | ORAL_TABLET | Freq: Every day | ORAL | Status: DC
  Administered 2021-03-23 – 2021-03-26 (×4): 200 mg via ORAL
  Filled 2021-03-23 (×2): qty 4
  Filled 2021-03-23 (×2): qty 2

## 2021-03-23 MED ORDER — METHYLPREDNISOLONE SODIUM SUCC 40 MG IJ SOLR: 40.0000 mg | Freq: Two times a day (BID) | INTRAMUSCULAR | Status: DC

## 2021-03-23 MED ORDER — SODIUM CHLORIDE 0.9 % IV SOLN
100.0000 mg | Freq: Once | INTRAVENOUS | Status: AC
  Administered 2021-03-24: 100 mg via INTRAVENOUS
  Filled 2021-03-23: qty 20

## 2021-03-23 MED ORDER — ACETAMINOPHEN 325 MG PO TABS
650.0000 mg | ORAL_TABLET | Freq: Once | ORAL | Status: AC
  Administered 2021-03-23: 650 mg via ORAL
  Filled 2021-03-23: qty 2

## 2021-03-23 MED ORDER — MAGNESIUM SULFATE 2 GM/50ML IV SOLN
2.0000 g | Freq: Once | INTRAVENOUS | Status: AC
  Administered 2021-03-24: 2 g via INTRAVENOUS
  Filled 2021-03-23: qty 50

## 2021-03-23 MED ORDER — POTASSIUM CHLORIDE CRYS ER 20 MEQ PO TBCR
40.0000 meq | EXTENDED_RELEASE_TABLET | Freq: Once | ORAL | Status: AC
  Administered 2021-03-23: 40 meq via ORAL
  Filled 2021-03-23: qty 2

## 2021-03-23 MED ORDER — OXYCODONE-ACETAMINOPHEN 5-325 MG PO TABS
1.0000 | ORAL_TABLET | Freq: Three times a day (TID) | ORAL | Status: DC | PRN
Start: 2021-03-23 — End: 2021-03-27
  Administered 2021-03-23 – 2021-03-27 (×3): 1 via ORAL
  Filled 2021-03-23 (×3): qty 1

## 2021-03-23 MED ORDER — INSULIN ASPART 100 UNIT/ML ~~LOC~~ SOLN
0.0000 [IU] | Freq: Every day | SUBCUTANEOUS | Status: DC
  Administered 2021-03-24: 5 [IU] via SUBCUTANEOUS
  Administered 2021-03-26: 4 [IU] via SUBCUTANEOUS
  Filled 2021-03-23: qty 0.05

## 2021-03-23 MED ORDER — SERTRALINE HCL 50 MG PO TABS
50.0000 mg | ORAL_TABLET | Freq: Every day | ORAL | Status: DC
  Administered 2021-03-24 – 2021-03-27 (×4): 50 mg via ORAL
  Filled 2021-03-23 (×4): qty 1

## 2021-03-23 MED ORDER — FERROUS SULFATE 325 (65 FE) MG PO TABS
325.0000 mg | ORAL_TABLET | Freq: Two times a day (BID) | ORAL | Status: DC
  Administered 2021-03-24 – 2021-03-27 (×7): 325 mg via ORAL
  Filled 2021-03-23 (×8): qty 1

## 2021-03-23 MED ORDER — ONDANSETRON HCL 4 MG/2ML IJ SOLN
4.0000 mg | Freq: Four times a day (QID) | INTRAMUSCULAR | Status: DC | PRN
  Administered 2021-03-24 – 2021-03-27 (×8): 4 mg via INTRAVENOUS
  Filled 2021-03-23 (×8): qty 2

## 2021-03-23 MED ORDER — BREXPIPRAZOLE 2 MG PO TABS
2.0000 mg | ORAL_TABLET | Freq: Every day | ORAL | Status: DC
  Administered 2021-03-24 – 2021-03-27 (×4): 2 mg via ORAL
  Filled 2021-03-23 (×4): qty 1

## 2021-03-23 MED ORDER — SODIUM CHLORIDE 0.9 % IV SOLN
100.0000 mg | Freq: Every day | INTRAVENOUS | Status: AC
  Administered 2021-03-24 – 2021-03-27 (×4): 100 mg via INTRAVENOUS
  Filled 2021-03-23 (×4): qty 20

## 2021-03-23 NOTE — H&P (Signed)
History and Physical    Felicia Bradley AGT:[id number] DOB: 1991/03/18 DOA: 03/23/2021  PCP: [facility]  Patient coming from: Home  I have personally briefly reviewed patient's old medical records in [facility]  Chief Complaint: increasing shortness of breath, diarrhea  HPI: Felicia Bradley is a 31 y.o. female with medical history significant for COPD/severe persistent asthma with chronic hypoxemia on 4 L on chronic steroid, OSA on CPAP, history of DVT/PE, CKD, GERD, PTSD/bipolar disorder, history of heroin abuse who presents with concerns of increasing shortness of breath, dizziness and diarrhea.  Patient was recently hospitalized from 4/5-4/8 for asthma exacerbation thought secondary to coronavirus OC43.  Patient was discharged on 4 L O2 supplementation and steroid taper per pulmonology recommendation.  However patient states that she never fully felt better at discharge.  She has had diarrhea even during that admission that has not resolved..  Feels her blood pressure has been waxing and waning.  Has been having fever.  Feels dizzy just taking a shower. She is extremely short of breath with chest pain worse with coughing. Reports she has been intubated 8 times in the past for her asthma.  Last time being in 2018.  States her asthma is very severe and she has exacerbations several times a week and can be triggered by just different smells.  She takes chronic prednisone and also receives steroid injections twice a month.  In the ED, patient was afebrile but hypotensive down to systolic of 80s over 50s and was not put on her baseline oxygen supplementation at the time my evaluation.  She had leukocytosis of 12.2 but has been on steroids.  Hemoglobin of 11.3.  Lactate of 2.7 down to 2.5.  Hypokalemia of 2.5.  SARS Covid PCR positive  Chest x-ray negative  Review of Systems: Constitutional: No Weight Change, + Fever ENT/Mouth: No sore throat, No Rhinorrhea Eyes: No Eye Pain, No  Vision Changes Cardiovascular: + Chest Pain, + SOB, No PND, No Dyspnea on Exertion, No Orthopnea, No Claudication, No Edema, No Palpitations Respiratory: No Cough, No Sputum, No Wheezing, no Dyspnea  Gastrointestinal: No Nausea, No Vomiting, No Diarrhea, No Constipation, No Pain Genitourinary: No Urgency, No Flank Pain Musculoskeletal: No Arthralgias, No Myalgias Skin: No Skin Lesions, No Pruritus, Neuro: no Weakness, No Numbness,  No Loss of Consciousness, +Syncope Psych: No Anxiety/Panic, No Depression, no decrease appetite Heme/Lymph: No Bruising, No Bleeding  Past Medical History:  Diagnosis Date  . Asthma   . Bipolar disorder (HCC)   . Diabetes mellitus without complication (HCC)    type 2  . DVT (deep venous thrombosis) (HCC) 2015   w/ recannulation per Care Everywhere  . Hypertension   . Obesity   . Pulmonary embolism (HCC) 2015    Past Surgical History:  Procedure Laterality Date  . CESAREAN SECTION       reports that she has never smoked. She has never used smokeless tobacco. She reports that she does not drink alcohol and does not use drugs. Social History  Allergies  Allergen Reactions  . Contrast Media [Iodinated Diagnostic Agents] Anaphylaxis  . Dilaudid [Hydromorphone] Anaphylaxis  . Tramadol Itching  . Apple Hives  . Banana Hives  . Coconut Oil Hives and Itching  . Dexamethasone Nausea And Vomiting and Hives    "feels like my pubic hair is on fire"  . Other Hives    Zucchini and squash  . Solu-Medrol [Methylprednisolone] Nausea And Vomiting  . Toradol [Ketorolac Tromethamine] Hives and Itching  Family History  Problem Relation Age of Onset  . Diabetes Mother   . Asthma Mother   . Asthma Maternal Aunt   . Diabetes Maternal Aunt      Prior to Admission medications   Medication Sig Start Date End Date Taking? Authorizing Provider  acetaminophen (TYLENOL) 500 MG tablet Take 500 mg by mouth every 6 (six) hours as needed for mild pain.   Yes  [provider]  albuterol (VENTOLIN HFA) 108 (90 Base) MCG/ACT inhaler Inhale 1 puff into the lungs 2 (two) times daily as needed for wheezing or shortness of breath.  11/10/14  Yes [provider]  ALPRAZolam Prudy Feeler(XANAX) 0.5 MG tablet Take 0.5 mg by mouth 2 (two) times daily. 01/23/21  Yes [provider]  ARIPiprazole (ABILIFY) 2 MG tablet Take 2 mg by mouth daily.   Yes [provider]  aspirin EC 81 MG tablet Take 81 mg by mouth daily. Swallow whole.   Yes [provider]  brexpiprazole (REXULTI) 2 MG TABS tablet Take 2 mg by mouth daily.   Yes [provider]  budesonide-formoterol (SYMBICORT) 160-4.5 MCG/ACT inhaler Inhale 2 puffs into the lungs 2 (two) times daily.   Yes [provider]  busPIRone (BUSPAR) 7.5 MG tablet Take 7.5 mg by mouth in the morning and at bedtime.   Yes [provider]  celecoxib (CELEBREX) 100 MG capsule Take 100 mg by mouth 2 (two) times daily as needed for pain. 02/24/21  Yes [provider]  cyclobenzaprine (FLEXERIL) 10 MG tablet Take 10 mg by mouth 3 (three) times daily as needed for muscle spasms.   Yes [provider]  diclofenac Sodium (VOLTAREN) 1 % GEL Apply 2 g topically 4 (four) times daily. Patient taking differently: Apply 2 g topically 2 (two) times daily as needed (pain). 06/28/20  Yes Hall-Potvin, [place], PA-C  EPINEPHrine 0.3 mg/0.3 mL IJ SOAJ injection Inject 0.3 mg into the muscle as needed for anaphylaxis.  11/10/14  Yes [provider]  ferrous sulfate 325 (65 FE) MG EC tablet Take 325 mg by mouth 2 (two) times daily with a meal. 11/10/14  Yes [provider]  folic acid (FOLVITE) 1 MG tablet Take 1 tablet (1 mg total) by mouth daily. 03/22/21  Yes Sheikh, Omair Latif, DO  gabapentin (NEURONTIN) 300 MG capsule Take 300 mg by mouth 3 (three) times daily.   Yes [provider]  guaiFENesin (MUCINEX) 600 MG 12 hr tablet Take 1 tablet (600 mg  total) by mouth 2 (two) times daily for 5 days. 03/21/21 03/26/21 Yes Sheikh, Omair Latif, DO  ipratropium-albuterol (DUONEB) 0.5-2.5 (3) MG/3ML SOLN Take 3 mLs by nebulization in the morning, at noon, and at bedtime. 07/19/19  Yes [provider]  LATUDA 20 MG TABS tablet Take 20 mg by mouth daily. 11/19/20  Yes [provider]  Multiple Vitamin (MULTIVITAMIN WITH MINERALS) TABS tablet Take 1 tablet by mouth daily.   Yes [provider]  oxyCODONE-acetaminophen (PERCOCET) 10-325 MG tablet Take 1 tablet by mouth 3 (three) times daily as needed for severe pain. 02/24/21  Yes [provider]  senna-docusate (SENOKOT-S) 8.6-50 MG tablet Take 1 tablet by mouth at bedtime. 03/21/21  Yes Sheikh, Omair Latif, DO  sertraline (ZOLOFT) 50 MG tablet Take 50 mg by mouth daily. 01/13/21  Yes [provider]  sucralfate (CARAFATE) 1 GM/10ML suspension Take 1 g by mouth 2 (two) times daily before a meal.   Yes [provider]  traZODone (  DESYREL) 100 MG tablet Take 200 mg by mouth at bedtime. 11/19/20  Yes [provider]  predniSONE (STERAPRED UNI-PAK 21 TAB) 10 MG (21) TBPK tablet Take 6 tablets on day 1, 5 tablets on day 2, 4 tablets on day 3, 3 tablets on day 4, 2 tablets on day 5, 1 tablet on day 6 and then stop on day 7 Patient not taking: No sig reported 03/21/21   Marguerita Merles [place], DO    Physical Exam: Vitals:   03/23/21 2000 03/23/21 2100 03/23/21 2145 03/23/21 2200  BP: 122/75 (!) 123/100 112/74 138/88  Pulse: (!) 109 98 90 81  Resp: 18 20 18 18   Temp:  98 F (36.7 C)    TempSrc:      SpO2: 99% 98% 98% 98%    Constitutional: NAD, ill-appearing obese female laying at 20 degree incline in bed Vitals:   03/23/21 2000 03/23/21 2100 03/23/21 2145 03/23/21 2200  BP: 122/75 (!) 123/100 112/74 138/88  Pulse: (!) 109 98 90 81  Resp: 18 20 18 18   Temp:  98 F (36.7 C)    TempSrc:      SpO2: 99% 98% 98% 98%   Eyes: PERRL, lids and conjunctivae  normal ENMT: Mucous membranes are moist.  Neck: normal, supple,  Respiratory: Diffuse expiratory wheezing throughout with frequent deep productive cough, increased work of breathing at rest and worse with exertion. No accessory muscle use.  Cardiovascular: Regular rate and rhythm, no murmurs / rubs / gallops.  Nonpitting bilateral ankle edema  abdomen: no tenderness, no masses palpated. No hepatosplenomegaly. Bowel sounds positive.  Musculoskeletal: no clubbing / cyanosis. No joint deformity upper and lower extremities. Good ROM, no contractures. Normal muscle tone.  Skin: no rashes, lesions, ulcers. No induration Neurologic: CN 2-12 grossly intact. Sensation intact,  Strength 5/5 in all 4.  Psychiatric: Normal judgment and insight. Alert and oriented x 3. Normal mood.     Labs on Admission: I have personally reviewed following labs and imaging studies  CBC: Recent Labs  Lab 03/18/21 2020 03/19/21 0344 03/20/21 0356 03/21/21 0416 03/23/21 1725  WBC 7.8 7.4 14.1* 13.1* 12.2*  NEUTROABS 4.4 6.7 12.2* 10.9* 6.5  HGB 10.6* 10.6* 10.4* 10.3* 11.3*  HCT 34.5* 36.3 34.9* 35.8* 37.5  MCV 88.9 92.6 93.3 94.7 92.1  PLT 387 410* 433* 406* 395   Basic Metabolic Panel: Recent Labs  Lab 03/18/21 2020 03/19/21 0344 03/20/21 0356 03/21/21 0416 03/23/21 1725  NA 141 145 139 142 142  K 3.3* 3.4*  3.4* 4.8 4.5 3.2*  CL 106 111 109 110 103  CO2 26 23 23 25 30   GLUCOSE 113* 227* 244* 247* 170*  BUN 6 7 10 15  21*  CREATININE 0.63 0.68 0.64 0.69 0.80  CALCIUM 8.6* 8.8* 8.8* 9.1 8.5*  MG  --  2.2  2.2 2.2 2.4  --   PHOS  --  2.5 2.0* 2.3*  --    GFR: Estimated Creatinine Clearance: 141.5 mL/min (by C-G formula based on SCr of 0.8 mg/dL). Liver Function Tests: Recent Labs  Lab 03/18/21 2020 03/19/21 0344 03/20/21 0356 03/21/21 0416 03/23/21 1725  AST 24 27 35 17 24  ALT 21 23 29 26  51*  ALKPHOS 59 57 58 51 70  BILITOT 0.1* 0.2* 0.4 0.4 0.3  PROT 6.9 7.2 7.0 6.9 6.6  ALBUMIN  3.1* 3.4* 3.3* 3.3* 3.2*   No results for input(s): LIPASE, AMYLASE in the last 168 hours. No results for input(s): AMMONIA in the  last 168 hours. Coagulation Profile: Recent Labs  Lab 03/18/21 2020  INR 1.0   Cardiac Enzymes: No results for input(s): CKTOTAL, CKMB, CKMBINDEX, TROPONINI in the last 168 hours. BNP (last 3 results) No results for input(s): PROBNP in the last 8760 hours. HbA1C: Recent Labs    03/21/21 0416  HGBA1C 6.2*   CBG: Recent Labs  Lab 03/20/21 0722 03/20/21 2205 03/21/21 0724 03/21/21 1319 03/21/21 1655  GLUCAP 199* 273* 276* 240* 279*   Lipid Profile: No results for input(s): CHOL, HDL, LDLCALC, TRIG, CHOLHDL, LDLDIRECT in the last 72 hours. Thyroid Function Tests: No results for input(s): TSH, T4TOTAL, FREET4, T3FREE, THYROIDAB in the last 72 hours. Anemia Panel: Recent Labs    03/21/21 0416  VITAMINB12 260  FOLATE 4.4*  FERRITIN 16  TIBC 364  IRON 29  RETICCTPCT 1.7   Urine analysis:    Component Value Date/Time   COLORURINE YELLOW 03/19/2021 0722   APPEARANCEUR HAZY (A) 03/19/2021 0722   LABSPEC 1.028 03/19/2021 0722   PHURINE 5.0 03/19/2021 0722   GLUCOSEU 50 (A) 03/19/2021 0722   HGBUR SMALL (A) 03/19/2021 0722   BILIRUBINUR NEGATIVE 03/19/2021 0722   KETONESUR 5 (A) 03/19/2021 0722   PROTEINUR NEGATIVE 03/19/2021 0722   UROBILINOGEN 0.2 12/04/2009 1301   NITRITE NEGATIVE 03/19/2021 0722   LEUKOCYTESUR NEGATIVE 03/19/2021 0722    Radiological Exams on Admission: DG Chest Port 1 View  Result Date: 03/23/2021 CLINICAL DATA:  30 year old with cough. EXAM: PORTABLE CHEST 1 VIEW COMPARISON:  03/21/2021 and earlier. FINDINGS: Markedly suboptimal inspiration which accounts for bibasilar atelectasis and extension weights the cardiac silhouette. Taking this into account, cardiac silhouette likely upper normal in size to mildly enlarged. Lungs otherwise clear. Pulmonary vascularity normal. IMPRESSION: Markedly suboptimal inspiration  accounts for bibasilar atelectasis. Electronically Signed   By: Hulan Saas M.D.   On: 03/23/2021 18:14      Assessment/Plan  Acute asthma exacerbation secondary to SARS-COVID virus infection Discharged on Baseline 4L on 4/8 Ipratropium-albuterol inhaler scheduled q4hr  Give IV Mg x 1 IV solu medrol  BID  continue home bronchodilator Maintain O2 greater than 88-92%  Check VBG  SARS COVID viral infection negative CXR but given her worsening respiratory status will start remdesivir IV steroids as above Patient has completed Pfizer vaccine but has not yet received booster.  She was treated for COVID back in August 2021.  Recently she also was positive for coronavirus OC43 on 4/6  Hypotension/diarrhea due to hypovolemia from diarrhea check GI stool panel  Has received 1 L normal saline fluid in the ED Start IV continuous 100 cc/h fluid  Hypokalemia  repete with oral K   OSA unable to be put on CPAP due to COVID infection   Hx of DVT/PE  D-dimer not elevated so will not anticoagulate   Type 2 diabetes  HbA1C of 6.2 Start moderate SSI while on steroids   PTSD/bipolar disorder Continue Xanax, Abilify, BuSpar, Latuda, trazodone  Super morbid obesity  complicates overall prognosis and care  DVT prophylaxis:.Lovenox Code Status: Full Family Communication: Plan discussed with patient at bedside  disposition Plan: Home with at least 2 midnight stays  Consults called:  Admission status: inpatient   Level of care: Stepdown  Status is: Inpatient  Remains inpatient appropriate because:Inpatient level of care appropriate due to severity of illness   Dispo: The patient is from: Home              Anticipated d/c is to: Home  Patient currently is not medically stable to d/c.   Difficult to place patient No         Anselm Jungling DO [facility]   If 7PM-7AM, please contact night-coverage [email or web address]   03/23/2021, 11:09 PM

## 2021-03-23 NOTE — ED Provider Notes (Signed)
[facility]-EMERGENCY DEPT Provider Note   CSN: [id number] Arrival date & time: 03/23/21  1618     History Chief Complaint  Patient presents with  . Asthma  . Hypotension    Felicia Bradley is a 30 y.o. female.  The history is provided by the patient and medical records.  Asthma   Felicia Bradley is a 30 y.o. female who presents to the Emergency Department complaining of shortness of breath. She presents the emergency department complaining of shortness of breath. She was recently admitted for asthma exacerbation and discharged home two days ago. She states that since hospital discharge she has continued to feel worse. She complains of three days of sore throat, subjective fevers for the last week, cough productive of green mucus persistent shortness of breath. Today when she was in the shower she felt like she was Felicia Bradley pass out. She has been compliant with her home medications since hospital discharge. Symptoms are severe, constant, worsening.    Past Medical History:  Diagnosis Date  . Asthma   . Bipolar disorder (HCC)   . Diabetes mellitus without complication (HCC)    type 2  . DVT (deep venous thrombosis) (HCC) 2015   w/ recannulation per Care Everywhere  . Hypertension   . Obesity   . Pulmonary embolism (HCC) 2015    Patient Active Problem List   Diagnosis Date Noted  . Hypotension 03/23/2021  . History of DVT (deep vein thrombosis) 03/23/2021  . History of pulmonary embolus (PE) 03/23/2021  . PTSD (post-traumatic stress disorder) 03/23/2021  . Bipolar 1 disorder (HCC) 03/23/2021  . BMI 50.0-59.9, adult (HCC) 03/23/2021  . SIRS (systemic inflammatory response syndrome) (HCC) 03/19/2021  . Chest pain 03/19/2021  . Emesis 03/19/2021  . Hypokalemia 03/19/2021  . Malingering 01/22/2020  . Acute respiratory disease due to COVID-19 virus 01/18/2020  . Asthma 01/18/2020  . Asthma exacerbation 01/14/2020  . Type 2 DM with CKD and hypertension  (HCC) 01/14/2020  . OSA (obstructive sleep apnea) 01/14/2020  . Suspected COVID-19 virus infection     Past Surgical History:  Procedure Laterality Date  . CESAREAN SECTION       OB History    Gravida  3   Para      Term      Preterm      AB  2   Living  0     SAB  1   IAB  1   Ectopic      Multiple      Live Births              Family History  Problem Relation Age of Onset  . Diabetes Mother   . Asthma Mother   . Asthma Maternal Aunt   . Diabetes Maternal Aunt     Social History   Tobacco Use  . Smoking status: Never Smoker  . Smokeless tobacco: Never Used  Substance Use Topics  . Alcohol use: No  . Drug use: No    Home Medications Prior to Admission medications   Medication Sig Start Date End Date Taking? Authorizing Provider  acetaminophen (TYLENOL) 500 MG tablet Take 500 mg by mouth every 6 (six) hours as needed for mild pain.   Yes [provider]  albuterol (VENTOLIN HFA) 108 (90 Base) MCG/ACT inhaler Inhale 1 puff into the lungs 2 (two) times daily as needed for wheezing or shortness of breath.  11/10/14  Yes [provider]  ALPRAZolam Prudy Feeler) 0.5  MG tablet Take 0.5 mg by mouth 2 (two) times daily. 01/23/21  Yes [provider]  ARIPiprazole (ABILIFY) 2 MG tablet Take 2 mg by mouth daily.   Yes [provider]  aspirin EC 81 MG tablet Take 81 mg by mouth daily. Swallow whole.   Yes [provider]  brexpiprazole (REXULTI) 2 MG TABS tablet Take 2 mg by mouth daily.   Yes [provider]  budesonide-formoterol (SYMBICORT) 160-4.5 MCG/ACT inhaler Inhale 2 puffs into the lungs 2 (two) times daily.   Yes [provider]  busPIRone (BUSPAR) 7.5 MG tablet Take 7.5 mg by mouth in the morning and at bedtime.   Yes [provider]  celecoxib (CELEBREX) 100 MG capsule Take 100 mg by mouth 2 (two) times daily as needed for pain. 02/24/21  Yes [provider]  cyclobenzaprine  (FLEXERIL) 10 MG tablet Take 10 mg by mouth 3 (three) times daily as needed for muscle spasms.   Yes [provider]  diclofenac Sodium (VOLTAREN) 1 % GEL Apply 2 g topically 4 (four) times daily. Patient taking differently: Apply 2 g topically 2 (two) times daily as needed (pain). 06/28/20  Yes Hall-Potvin, [place], PA-C  EPINEPHrine 0.3 mg/0.3 mL IJ SOAJ injection Inject 0.3 mg into the muscle as needed for anaphylaxis.  11/10/14  Yes [provider]  ferrous sulfate 325 (65 FE) MG EC tablet Take 325 mg by mouth 2 (two) times daily with a meal. 11/10/14  Yes [provider]  folic acid (FOLVITE) 1 MG tablet Take 1 tablet (1 mg total) by mouth daily. 03/22/21  Yes Sheikh, Omair Latif, DO  gabapentin (NEURONTIN) 300 MG capsule Take 300 mg by mouth 3 (three) times daily.   Yes [provider]  guaiFENesin (MUCINEX) 600 MG 12 hr tablet Take 1 tablet (600 mg total) by mouth 2 (two) times daily for 5 days. 03/21/21 03/26/21 Yes Sheikh, Omair Latif, DO  ipratropium-albuterol (DUONEB) 0.5-2.5 (3) MG/3ML SOLN Take 3 mLs by nebulization in the morning, at noon, and at bedtime. 07/19/19  Yes [provider]  LATUDA 20 MG TABS tablet Take 20 mg by mouth daily. 11/19/20  Yes [provider]  Multiple Vitamin (MULTIVITAMIN WITH MINERALS) TABS tablet Take 1 tablet by mouth daily.   Yes [provider]  oxyCODONE-acetaminophen (PERCOCET) 10-325 MG tablet Take 1 tablet by mouth 3 (three) times daily as needed for severe pain. 02/24/21  Yes [provider]  senna-docusate (SENOKOT-S) 8.6-50 MG tablet Take 1 tablet by mouth at bedtime. 03/21/21  Yes Sheikh, Omair Latif, DO  sertraline (ZOLOFT) 50 MG tablet Take 50 mg by mouth daily. 01/13/21  Yes [provider]  sucralfate (CARAFATE) 1 GM/10ML suspension Take 1 g by mouth 2 (two) times daily before a meal.   Yes [provider]  traZODone (DESYREL) 100 MG tablet Take 200 mg by mouth at  bedtime. 11/19/20  Yes [provider]  predniSONE (STERAPRED UNI-PAK 21 TAB) 10 MG (21) TBPK tablet Take 6 tablets on day 1, 5 tablets on day 2, 4 tablets on day 3, 3 tablets on day 4, 2 tablets on day 5, 1 tablet on day 6 and then stop on day 7 Patient not taking: No sig reported 03/21/21   Marguerita Merles Latif, DO    Allergies    Contrast media [iodinated diagnostic agents], Dilaudid [hydromorphone], Tramadol, Apple, Banana, Coconut oil, Dexamethasone, Other, Solu-medrol [methylprednisolone], and Toradol [ketorolac tromethamine]  Review of Systems   Review of  Systems  All other systems reviewed and are negative.   Physical Exam Updated Vital Signs BP (!) 111/50   Pulse 81   Temp 98.7 F (37.1 C) (Oral)   Resp 18   LMP 03/11/2021   SpO2 100%   Physical Exam Vitals and nursing note reviewed.  Constitutional:      Appearance: She is well-developed.  HENT:     Head: Normocephalic and atraumatic.     Comments: Mild erythema in the posterior oropharynx. There are scattered small vesicles in the posterior oropharynx Cardiovascular:     Rate and Rhythm: Regular rhythm. Tachycardia present.     Heart sounds: No murmur heard.   Pulmonary:     Effort: Pulmonary effort is normal. No respiratory distress.     Comments: end expiratory wheezing bilaterally. Abdominal:     Palpations: Abdomen is soft.     Tenderness: There is no abdominal tenderness. There is no guarding or rebound.  Musculoskeletal:        General: No tenderness.     Comments: Trace edema to bilateral lower extremities  Skin:    General: Skin is warm and dry.  Neurological:     Mental Status: She is alert and oriented to person, place, and time.  Psychiatric:        Behavior: Behavior normal.     ED Results / Procedures / Treatments   Labs (all labs ordered are listed, but only abnormal results are displayed) Labs Reviewed  RESP PANEL BY RT-PCR (FLU A&B, COVID) ARPGX2 - Abnormal; Notable for the  following components:      Result Value   SARS Coronavirus 2 by RT PCR POSITIVE (*)    All other components within normal limits  RESPIRATORY PANEL BY PCR - Abnormal; Notable for the following components:   Coronavirus OC43 DETECTED (*)    All other components within normal limits  COMPREHENSIVE METABOLIC PANEL - Abnormal; Notable for the following components:   Potassium 3.2 (*)    Glucose, Bld 170 (*)    BUN 21 (*)    Calcium 8.5 (*)    Albumin 3.2 (*)    ALT 51 (*)    All other components within normal limits  CBC WITH DIFFERENTIAL/PLATELET - Abnormal; Notable for the following components:   WBC 12.2 (*)    Hemoglobin 11.3 (*)    Lymphs Abs 5.0 (*)    Abs Immature Granulocytes 0.17 (*)    All other components within normal limits  LACTIC ACID, PLASMA - Abnormal; Notable for the following components:   Lactic Acid, Venous 2.7 (*)    All other components within normal limits  LACTIC ACID, PLASMA - Abnormal; Notable for the following components:   Lactic Acid, Venous 2.5 (*)    All other components within normal limits  CULTURE, BLOOD (ROUTINE X 2)  CULTURE, BLOOD (ROUTINE X 2)  GASTROINTESTINAL PANEL BY PCR, STOOL (REPLACES STOOL CULTURE)  D-DIMER, QUANTITATIVE  BASIC METABOLIC PANEL  CBC  C-REACTIVE PROTEIN  PROCALCITONIN  LACTIC ACID, PLASMA  LACTIC ACID, PLASMA  BLOOD GAS, VENOUS  TROPONIN I (HIGH SENSITIVITY)  TROPONIN I (HIGH SENSITIVITY)    EKG EKG Interpretation  Date/Time:  Sunday March 23 2021 18:06:35 EDT Ventricular Rate:  119 PR Interval:  121 QRS Duration: 83 QT Interval:  328 QTC Calculation: 462 R Axis:   95 Text Interpretation: Sinus tachycardia Abnormal Q suggests anterior infarct Probable inferior infarct, old Confirmed by Tilden Fossaees, Harlo Jaso [phone]([id number]) on 03/23/2021 8:09:30 PM   Radiology DG  Chest Port 1 View  Result Date: 03/23/2021 CLINICAL DATA:  30 year old with cough. EXAM: PORTABLE CHEST 1 VIEW COMPARISON:  03/21/2021 and earlier. FINDINGS:  Markedly suboptimal inspiration which accounts for bibasilar atelectasis and extension weights the cardiac silhouette. Taking this into account, cardiac silhouette likely upper normal in size to mildly enlarged. Lungs otherwise clear. Pulmonary vascularity normal. IMPRESSION: Markedly suboptimal inspiration accounts for bibasilar atelectasis. Electronically Signed   By: Hulan Saas M.D.   On: 03/23/2021 18:14    Procedures Procedures   Medications Ordered in ED Medications  enoxaparin (LOVENOX) injection 40 mg (40 mg Subcutaneous Given 03/23/21 2328)  magnesium sulfate IVPB 2 g 50 mL (has no administration in time range)  0.9 %  sodium chloride infusion ( Intravenous New Bag/Given 03/23/21 2334)  loperamide (IMODIUM) capsule 2 mg (has no administration in time range)  ondansetron (ZOFRAN) injection 4 mg (has no administration in time range)  Ipratropium-Albuterol (COMBIVENT) respimat 1 puff (1 puff Inhalation Given 03/24/21 0011)  insulin aspart (novoLOG) injection 0-15 Units (has no administration in time range)  insulin aspart (novoLOG) injection 0-5 Units (0 Units Subcutaneous Not Given 03/24/21 0013)  remdesivir 100 mg in sodium chloride 0.9 % 100 mL IVPB (100 mg Intravenous New Bag/Given 03/23/21 2335)    Followed by  remdesivir 100 mg in sodium chloride 0.9 % 100 mL IVPB (100 mg Intravenous New Bag/Given 03/24/21 0011)    Followed by  remdesivir 100 mg in sodium chloride 0.9 % 100 mL IVPB (has no administration in time range)  methylPREDNISolone sodium succinate (SOLU-MEDROL) 40 mg/mL injection 40 mg (has no administration in time range)  acetaminophen (TYLENOL) tablet 500 mg (has no administration in time range)  aspirin EC tablet 81 mg (has no administration in time range)  ALPRAZolam (XANAX) tablet 0.5 mg (0.5 mg Oral Given 03/23/21 2350)  ARIPiprazole (ABILIFY) tablet 2 mg (has no administration in time range)  brexpiprazole (REXULTI) tablet 2 mg (has no administration in time  range)  busPIRone (BUSPAR) tablet 7.5 mg (7.5 mg Oral Given 03/24/21 0011)  lurasidone (LATUDA) tablet 20 mg (has no administration in time range)  sertraline (ZOLOFT) tablet 50 mg (has no administration in time range)  traZODone (DESYREL) tablet 200 mg (200 mg Oral Given 03/23/21 2349)  sucralfate (CARAFATE) 1 GM/10ML suspension 1 g (has no administration in time range)  ferrous sulfate tablet 325 mg (has no administration in time range)  folic acid (FOLVITE) tablet 1 mg (has no administration in time range)  gabapentin (NEURONTIN) capsule 300 mg (300 mg Oral Given 03/23/21 2350)  mometasone-formoterol (DULERA) 200-5 MCG/ACT inhaler 2 puff (2 puffs Inhalation Given 03/24/21 0011)  oxyCODONE-acetaminophen (PERCOCET/ROXICET) 5-325 MG per tablet 1 tablet (1 tablet Oral Given 03/23/21 2349)    And  oxyCODONE (Oxy IR/ROXICODONE) immediate release tablet 5 mg (5 mg Oral Given 03/23/21 2349)  sodium chloride 0.9 % bolus 500 mL (0 mLs Intravenous Stopped 03/23/21 1925)  ipratropium-albuterol (DUONEB) 0.5-2.5 (3) MG/3ML nebulizer solution 3 mL (3 mLs Nebulization Given 03/23/21 1719)  sodium chloride 0.9 % bolus 500 mL (0 mLs Intravenous Stopped 03/23/21 2055)  albuterol (VENTOLIN HFA) 108 (90 Base) MCG/ACT inhaler 4 puff (4 puffs Inhalation Given 03/23/21 2055)  acetaminophen (TYLENOL) tablet 650 mg (650 mg Oral Given 03/23/21 2055)  potassium chloride SA (KLOR-CON) CR tablet 40 mEq (40 mEq Oral Given 03/23/21 2354)    ED Course  I have reviewed the triage vital signs and the nursing notes.  Pertinent labs & imaging results that were available  during my care of the patient were reviewed by me and considered in my medical decision making (see chart for details).    MDM Rules/Calculators/A&P                         patient with history of asthma, recent hospitalization for asthma exacerbation with viral respiratory infection here for evaluation of worsening symptoms now with near syncopal sensation and chest  tightness. On examination patient with tachycardia, tachypnea, wheezing. She was hypotensive on ED presentation, improved after IV fluid administration. She did take oral steroids prior to ED presentation. She was treated with albuterol for asthma exacerbation. She is positive for COVID-19 infection today, unclear when she became infected she has been ill for the last week but did have a negative COVID test when she was admitted to the hospital. She does have a history of DVT, has a contrast allergy. Will check repeat D dimer. Hospitalist consulted for admission for ongoing treatment.  Final Clinical Impression(s) / ED Diagnoses Final diagnoses:  COVID-19 virus infection  Exacerbation of asthma, unspecified asthma severity, unspecified whether persistent    Rx / DC Orders ED Discharge Orders    None       Tilden Fossa, MD 03/24/21 [phone]

## 2021-03-23 NOTE — ED Triage Notes (Signed)
Patient here from home reporting asthma and cough. Reports that she was recently discharged on 4/8, states "I never got better".

## 2021-03-24 LAB — BLOOD GAS, VENOUS
Acid-Base Excess: 3.4 mmol/L — ABNORMAL HIGH (ref 0.0–2.0)
Bicarbonate: 31 mmol/L — ABNORMAL HIGH (ref 20.0–28.0)
FIO2: 21
O2 Saturation: 54.9 %
Patient temperature: 98.6
pCO2, Ven: 69.9 mmHg — ABNORMAL HIGH (ref 44.0–60.0)
pH, Ven: 7.27 (ref 7.250–7.430)
pO2, Ven: 35.6 mmHg (ref 32.0–45.0)

## 2021-03-24 LAB — GLUCOSE, CAPILLARY
Glucose-Capillary: 82 mg/dL (ref 70–99)
Glucose-Capillary: 106 mg/dL — ABNORMAL HIGH (ref 70–99)
Glucose-Capillary: 269 mg/dL — ABNORMAL HIGH (ref 70–99)
Glucose-Capillary: 323 mg/dL — ABNORMAL HIGH (ref 70–99)

## 2021-03-24 LAB — LACTIC ACID, PLASMA
Lactic Acid, Venous: 1.4 mmol/L (ref 0.5–1.9)
Lactic Acid, Venous: 1.1 mmol/L (ref 0.5–1.9)

## 2021-03-24 LAB — CBC
HCT: 32.4 % — ABNORMAL LOW (ref 36.0–46.0)
Hemoglobin: 9.4 g/dL — ABNORMAL LOW (ref 12.0–15.0)
MCH: 26.9 pg (ref 26.0–34.0)
MCHC: 29 g/dL — ABNORMAL LOW (ref 30.0–36.0)
MCV: 92.6 fL (ref 80.0–100.0)
Platelets: 330 10*3/uL (ref 150–400)
RBC: 3.5 MIL/uL — ABNORMAL LOW (ref 3.87–5.11)
RDW: 15.4 % (ref 11.5–15.5)
WBC: 11.2 10*3/uL — ABNORMAL HIGH (ref 4.0–10.5)
nRBC: 0.2 % (ref 0.0–0.2)

## 2021-03-24 LAB — CULTURE, BLOOD (ROUTINE X 2)
Culture: NO GROWTH
Culture: NO GROWTH
Special Requests: ADEQUATE
Special Requests: ADEQUATE

## 2021-03-24 LAB — BASIC METABOLIC PANEL
Anion gap: 9 (ref 5–15)
BUN: 15 mg/dL (ref 6–20)
CO2: 28 mmol/L (ref 22–32)
Calcium: 8 mg/dL — ABNORMAL LOW (ref 8.9–10.3)
Chloride: 103 mmol/L (ref 98–111)
Creatinine, Ser: 0.7 mg/dL (ref 0.44–1.00)
GFR, Estimated: >60 mL/min (ref >60)
Glucose, Bld: 114 mg/dL — ABNORMAL HIGH (ref 70–99)
Potassium: 3.6 mmol/L (ref 3.5–5.1)
Sodium: 140 mmol/L (ref 135–145)

## 2021-03-24 LAB — BRAIN NATRIURETIC PEPTIDE: B Natriuretic Peptide: 22.7 pg/mL (ref 0.0–100.0)

## 2021-03-24 LAB — C-REACTIVE PROTEIN: CRP: 1.2 mg/dL — ABNORMAL HIGH (ref <1.0)

## 2021-03-24 LAB — PROCALCITONIN: Procalcitonin: <0.1 ng/mL

## 2021-03-24 LAB — MRSA PCR SCREENING: MRSA by PCR: NEGATIVE

## 2021-03-24 MED ORDER — SODIUM CHLORIDE 0.9% FLUSH
10.0000 mL | Freq: Two times a day (BID) | INTRAVENOUS | Status: DC
  Administered 2021-03-24 – 2021-03-27 (×6): 10 mL via INTRAVENOUS

## 2021-03-24 MED ORDER — PROSOURCE PLUS PO LIQD
30.0000 mL | Freq: Two times a day (BID) | ORAL | Status: DC
  Administered 2021-03-25 (×2): 30 mL via ORAL
  Filled 2021-03-24 (×5): qty 30

## 2021-03-24 MED ORDER — ALBUTEROL SULFATE HFA 108 (90 BASE) MCG/ACT IN AERS: 2.0000 | INHALATION_SPRAY | RESPIRATORY_TRACT | Status: DC | PRN

## 2021-03-24 MED ORDER — ENOXAPARIN SODIUM 60 MG/0.6ML ~~LOC~~ SOLN
60.0000 mg | SUBCUTANEOUS | Status: DC
  Administered 2021-03-24 – 2021-03-26 (×3): 60 mg via SUBCUTANEOUS
  Filled 2021-03-24 (×4): qty 0.6

## 2021-03-24 MED ORDER — CHLORHEXIDINE GLUCONATE CLOTH 2 % EX PADS
6.0000 | MEDICATED_PAD | Freq: Every day | CUTANEOUS | Status: DC
  Administered 2021-03-24 – 2021-03-26 (×3): 6 via TOPICAL

## 2021-03-24 MED ORDER — ENSURE MAX PROTEIN PO LIQD
11.0000 [oz_av] | Freq: Every day | ORAL | Status: DC
  Administered 2021-03-24 – 2021-03-25 (×2): 11 [oz_av] via ORAL
  Filled 2021-03-24 (×4): qty 330

## 2021-03-24 MED ORDER — METHYLPREDNISOLONE SODIUM SUCC 40 MG IJ SOLR
40.0000 mg | Freq: Three times a day (TID) | INTRAMUSCULAR | Status: DC
  Administered 2021-03-24 – 2021-03-27 (×10): 40 mg via INTRAVENOUS
  Filled 2021-03-24 (×9): qty 1

## 2021-03-24 NOTE — Progress Notes (Signed)
Patient admitted to room 1241 from ED. Patient is calm, oriented x 4. 4 L [place] with no signs of distress or increased wob. NSR on bedside monitor. NS infusing at 100 mL/h. Magnesium IVP infusing. CCM aware of admission and videos into room. Will monitor through the night.

## 2021-03-24 NOTE — TOC Initial Note (Signed)
Transition of Care [facility]) - Initial/Assessment Note    Patient Details  Name: Felicia Bradley MRN: [id number] Date of Birth: November 18, 1991  Transition [facility]) CM/SW Contact:    Golda Acre, RN Phone Number: 03/24/2021, 9:42 AM  Clinical Narrative:                 30 y.o. female with medical history significant for COPD/severe persistent asthma with chronic hypoxemia on 4 L on chronic steroid, OSA on CPAP, history of DVT/PE, CKD, GERD, PTSD/bipolar disorder, history of heroin abuse who presents with concerns of increasing shortness of breath, dizziness and diarrhea.  Patient was recently hospitalized from 4/5-4/8 for asthma exacerbation thought secondary to coronavirus OC43.  Patient was discharged on 4 L O2 supplementation and steroid taper per pulmonology recommendation.  However patient states that she never fully felt better at discharge.  She has had diarrhea even during that admission that has not resolved..  Feels her blood pressure has been waxing and waning.  Has been having fever.  Feels dizzy just taking a shower. She is extremely short of breath with chest pain worse with coughing. Reports she has been intubated 8 times in the past for her asthma.  Last time being in 2018.  States her asthma is very severe and she has exacerbations several times a week and can be triggered by just different smells.  She takes chronic prednisone and also receives steroid injections twice a month.  In the ED, patient was afebrile but hypotensive down to systolic of 80s over 50s and was not put on her baseline oxygen supplementation at the time my evaluation.  She had leukocytosis of 12.2 but has been on steroids.  Hemoglobin of 11.3.  Lactate of 2.7 down to 2.5.  Hypokalemia of 2.5.  SARS Covid PCR positive  Chest x-ray negative PLAN: to return home with improved lung function/ Expected Discharge Plan: Home/Self Care Barriers to Discharge: Continued Medical Work up   Patient Goals and  CMS Choice Patient states their goals for this hospitalization and ongoing recovery are:: to go back to my home CMS Medicare.gov Compare Post Acute Care list provided to:: Patient    Expected Discharge Plan and Services Expected Discharge Plan: Home/Self Care   Discharge Planning Services: CM Consult   Living arrangements for the past 2 months: Apartment                                      Prior Living Arrangements/Services Living arrangements for the past 2 months: Apartment Lives with:: Self                   Activities of Daily Living      Permission Sought/Granted                  Emotional Assessment Appearance:: Appears stated age Attitude/Demeanor/Rapport: Engaged Affect (typically observed): Apprehensive Orientation: : Oriented to Self,Oriented to Place,Oriented to  Time,Oriented to Situation Alcohol / Substance Use: Not Applicable Psych Involvement: No (comment)  Admission diagnosis:  Asthma exacerbation [J45.901] Exacerbation of asthma, unspecified asthma severity, unspecified whether persistent [J45.901] COVID-19 virus infection [U07.1] Patient Active Problem List   Diagnosis Date Noted  . Hypotension 03/23/2021  . History of DVT (deep vein thrombosis) 03/23/2021  . History of pulmonary embolus (PE) 03/23/2021  . PTSD (post-traumatic stress disorder) 03/23/2021  . Bipolar 1 disorder (HCC) 03/23/2021  . BMI  50.0-59.9, adult (HCC) 03/23/2021  . SIRS (systemic inflammatory response syndrome) (HCC) 03/19/2021  . Chest pain 03/19/2021  . Emesis 03/19/2021  . Hypokalemia 03/19/2021  . Malingering 01/22/2020  . Acute respiratory disease due to COVID-19 virus 01/18/2020  . Asthma 01/18/2020  . Asthma exacerbation 01/14/2020  . Type 2 DM with CKD and hypertension (HCC) 01/14/2020  . OSA (obstructive sleep apnea) 01/14/2020  . Suspected COVID-19 virus infection    PCP:  [facility]:   [id number] -  [place], [place] - [address] [place] [address] Phone: [phone] Fax: [phone]  [id number] - [place],  - [address] AT [address] OF [facility] [address] [place] [address] Phone: [phone] Fax: [phone]     Social Determinants of Health (SDOH) Interventions    Readmission Risk Interventions No flowsheet data found.

## 2021-03-24 NOTE — Progress Notes (Signed)
Initial Nutrition Assessment  DOCUMENTATION CODES:   Morbid obesity  INTERVENTION:  - will order Ensure Max once/day, each supplement provides 150 kcal and 30 grams protein. - will order 30 ml Prosource Plus BID, each supplement provides 100 kcal and 15 grams protein.    NUTRITION DIAGNOSIS:   Increased nutrient needs related to acute illness,catabolic illness (COVID-19 infection) as evidenced by estimated needs.   GOAL:   Patient will meet greater than or equal to 90% of their needs  MONITOR:   PO intake,Supplement acceptance,Labs,Weight trends  REASON FOR ASSESSMENT:   Malnutrition Screening Tool  ASSESSMENT:   30 y.o. female with medical history of COPD/severe persistent asthma with chronic hypoxemia on 4L on chronic steroid, OSA on CPAP, history of DVT/PE, CKD, GERD, PTSD/bipolar disorder, and hx of heroin use. She presented to the ED due to increased SOB, dizziness, and diarrhea. She was hospitalized 4/5-4/8 for asthma exacerbation 2/2 COVID-19.  No intakes documented since admission. Able to talk with RN who reports that patient has mainly been sleeping soundly this shift and can be difficult to wake. Patient did not eat breakfast d/t sleeping and had not touched lunch that was delivered for the same reason; RN re-ordered lunch tray for patient a short time ago.   Patient has not been seen by a [place] RD at any time in the past.   Weight today is 296 lb and weight on 4/6 was 306 lb. This indicates 10 lb weight loss (3% body weight) in the past 1 week; this is significant for time frame. Weights prior to that were 279 lb on 03/06/21 and 306 lb on 10/26/20. No edema present at this time, per documentation in the edema flow sheet.   Per notes: - acute asthma exacerbation 2/2 COVID-19 infection--CXR negative - hypotension on admission--1L NS given in the ED   Labs reviewed; CBGs: 82 and 106 mg/dl, Ca: 8 mg/dl. Medications reviewed; 325 mg ferrous sulfate BID, 1 mg  folcite/day, sliding scale novolog, 2 mg imodium/day, 2 g IV Mg sulfate x1 run 4/10, 40 mg solu-medrol TID, 40 mEq Klor-Con x1 dose 4/10, 100 mg IV remdesivir x1 run 4/10 and x2 runs 4/11 and x1 run 4/12-4/14, 1 g carafate BID.     NUTRITION - FOCUSED PHYSICAL EXAM:  unable to complete at this time.   Diet Order:   Diet Order            Diet Heart Room service appropriate? Yes; Fluid consistency: Thin  Diet effective now                 EDUCATION NEEDS:   No education needs have been identified at this time  Skin:  Skin Assessment: Reviewed RN Assessment  Last BM:  PTA/unknown  Height:   Ht Readings from Last 1 Encounters:  03/18/21 5\' 3"  (1.6 m)    Weight:   Wt Readings from Last 1 Encounters:  03/24/21 134.4 kg     Estimated Nutritional Needs:  Kcal:  2015-2285 kcal Protein:  105-120 grams Fluid:  >/= 2.3 L/day     10-13-1986, MS, RD, LDN, CNSC Inpatient Clinical Dietitian RD pager # available in [facility]  After hours/weekend pager # available in [facility]

## 2021-03-24 NOTE — Progress Notes (Signed)
PROGRESS NOTE    Felicia Bradley  ZCH:[id number] DOB: [birth year] DOA: 03/23/2021 PCP: [facility]   Chief Complaint  Patient presents with  . Asthma  . Hypotension    Brief Narrative:  Felicia Bradley is Felicia Bradley 30 y.o. female with medical history significant for COPD/severe persistent asthma with chronic hypoxemia on 4 L, OSA on CPAP, history of DVT/PE, CKD, GERD, PTSD/bipolar disorder, history of heroin abuse who presents with concerns of increasing shortness of breath, dizziness and diarrhea.    Patient was recently hospitalized from 4/5-4/8 for asthma exacerbation thought secondary to coronavirus OC43.  Patient was discharged on 4 L O2 supplementation and steroid taper per pulmonology recommendation.  She was readmitted with asthma/COPD exacerbation, now with COVID 19 infection.    Assessment & Plan:   Principal Problem:   Asthma exacerbation Active Problems:   OSA (obstructive sleep apnea)   Acute respiratory disease due to COVID-19 virus   Hypokalemia   Hypotension   History of DVT (deep vein thrombosis)   History of pulmonary embolus (PE)   PTSD (post-traumatic stress disorder)   Bipolar 1 disorder (HCC)   BMI 50.0-59.9, adult (HCC)  Acute Asthma/COPD Exacerbation  Chronic Hypoxic Respiratory failure  Sars CoV 2 Infection  COVID 19  Coronavirus OC43 Virus Infection Recently discharged on 4/8 after treatment for exacerbation in setting of coronavirus OC43 Readmitted 4/10 with COVID 19 with exacerbation of her respiratory disease Currently on 4 L which is her baseline O2 need VBG with compensated respiratory acidosis Continue solumedrol.  Continue scheduled and prn inhalers (combivent, albuterol).  Continue dulera. Remdesivir, plan for 5 days for now. Discussed actemra, off label use.  Denies TB, hepatitis, diverticulitis.  She's agreeable to this if needed.  COVID-19 Labs  Recent Labs    03/23/21 1937 03/23/21 2336  DDIMER 0.49  --   CRP  --  1.2*     Lab Results  Component Value Date   SARSCOV2NAA POSITIVE (Jamonte Curfman) 03/23/2021   SARSCOV2NAA NEGATIVE 03/18/2021   SARSCOV2NAA NEGATIVE 07/08/2020   SARSCOV2NAA POSITIVE (Diasha Castleman) 01/18/2020   Hypotension Seems improved with IVF, follow  Diarrhea Likely related to covid, follow GI path panel/c diff  OSA Plan for cpap nightly  Hx DVT/PE Anticoagulation was d/c'd.  See 09/06/2019 note from hematology.  She seemed to think she'd been restarted recently, but no indication of this I can see.  Discussed with pharmacy.  Follow outpatient. Negative D dimer  T2DM a1c 6.2 SSI - adjust as needed  PTSD Continue home xanax, abilify, buspar, latuda, trazodone  Anemia Likely component of dilution, follow anemia labs  Obesity complicates prognosis and care Body mass index is 52.49 kg/m.   DVT prophylaxis: lovenox Code Status:fuull  Family Communication: parents over phone in room Disposition:   Status is: Inpatient  Not inpatient appropriate, will call [facility] team and downgrade to OBS.   Dispo: The patient is from: Home              Anticipated d/c is to: Home              Patient currently is not medically stable to d/c.   Difficult to place patient No  Consultants:   none  Procedures:  none  Antimicrobials:  Anti-infectives (From admission, onward)   Start     Dose/Rate Route Frequency Ordered Stop   03/24/21 1000  remdesivir 100 mg in sodium chloride 0.9 % 100 mL IVPB       "Followed by" Linked Group Details  100 mg 200 mL/hr over 30 Minutes Intravenous Daily 03/23/21 2315 03/28/21 0959   03/24/21 0000  remdesivir 100 mg in sodium chloride 0.9 % 100 mL IVPB       "Followed by" Linked Group Details   100 mg 200 mL/hr over 30 Minutes Intravenous  Once 03/23/21 2315 03/24/21 0137   03/23/21 2330  remdesivir 100 mg in sodium chloride 0.9 % 100 mL IVPB       "Followed by" Linked Group Details   100 mg 200 mL/hr over 30 Minutes Intravenous  Once 03/23/21 2315 03/24/21 0115       Subjective: Notes she's not feeling good in general   Objective: Vitals:   03/24/21 0700 03/24/21 0800 03/24/21 0907 03/24/21 1000  BP: 91/61  110/70 107/69  Pulse: 77 94 72 75  Resp: 17 (!) 24 16 20   Temp:  97.9 F (36.6 C)    TempSrc:  Oral    SpO2: 100% 100% 100% 100%  Weight:        Intake/Output Summary (Last 24 hours) at 03/24/2021 1153 Last data filed at 03/24/2021 1028 Gross per 24 hour  Intake 2319.81 ml  Output 200 ml  Net 2119.81 ml   Filed Weights   03/24/21 0600  Weight: 134.4 kg    Examination:  General exam: Appears calm and comfortable  Respiratory system: mildly increased wob Cardiovascular system: RRR Gastrointestinal system: Abdomen is nondistended, soft and nontender Central nervous system: Alert and oriented. No focal neurological deficits. Extremities: no LEE Skin: No rashes, lesions or ulcers Psychiatry: Judgement and insight appear normal. Mood & affect appropriate.     Data Reviewed: I have personally reviewed following labs and imaging studies  CBC: Recent Labs  Lab 03/18/21 2020 03/19/21 0344 03/20/21 0356 03/21/21 0416 03/23/21 1725 03/24/21 0240  WBC 7.8 7.4 14.1* 13.1* 12.2* 11.2*  NEUTROABS 4.4 6.7 12.2* 10.9* 6.5  --   HGB 10.6* 10.6* 10.4* 10.3* 11.3* 9.4*  HCT 34.5* 36.3 34.9* 35.8* 37.5 32.4*  MCV 88.9 92.6 93.3 94.7 92.1 92.6  PLT 387 410* 433* 406* 395 330    Basic Metabolic Panel: Recent Labs  Lab 03/19/21 0344 03/20/21 0356 03/21/21 0416 03/23/21 1725 03/24/21 0240  NA 145 139 142 142 140  K 3.4*  3.4* 4.8 4.5 3.2* 3.6  CL 111 109 110 103 103  CO2 23 23 25 30 28   GLUCOSE 227* 244* 247* 170* 114*  BUN 7 10 15  21* 15  CREATININE 0.68 0.64 0.69 0.80 0.70  CALCIUM 8.8* 8.8* 9.1 8.5* 8.0*  MG 2.2  2.2 2.2 2.4  --   --   PHOS 2.5 2.0* 2.3*  --   --     GFR: Estimated Creatinine Clearance: 138.3 mL/min (by C-G formula based on SCr of 0.7 mg/dL).  Liver Function Tests: Recent Labs  Lab  03/18/21 2020 03/19/21 0344 03/20/21 0356 03/21/21 0416 03/23/21 1725  AST 24 27 35 17 24  ALT 21 23 29 26  51*  ALKPHOS 59 57 58 51 70  BILITOT 0.1* 0.2* 0.4 0.4 0.3  PROT 6.9 7.2 7.0 6.9 6.6  ALBUMIN 3.1* 3.4* 3.3* 3.3* 3.2*    CBG: Recent Labs  Lab 03/21/21 0724 03/21/21 1319 03/21/21 1655 03/24/21 0755 03/24/21 1124  GLUCAP 276* 240* 279* 82 106*     Recent Results (from the past 240 hour(s))  Resp Panel by RT-PCR (Flu Denice Cardon&B, Covid) Nasopharyngeal Swab     Status: None   Collection Time: 03/18/21  8:20 PM  Specimen: Nasopharyngeal Swab; Nasopharyngeal(NP) swabs in vial transport medium  Result Value Ref Range Status   SARS Coronavirus 2 by RT PCR NEGATIVE NEGATIVE Final    Comment: (NOTE) SARS-CoV-2 target nucleic acids are NOT DETECTED.  The SARS-CoV-2 RNA is generally detectable in upper respiratory specimens during the acute phase of infection. The lowest concentration of SARS-CoV-2 viral copies this assay can detect is 138 copies/mL. Ginnette Gates negative result does not preclude SARS-Cov-2 infection and should not be used as the sole basis for treatment or other patient management decisions. Dellamae Rosamilia negative result may occur with  improper specimen collection/handling, submission of specimen other than nasopharyngeal swab, presence of viral mutation(s) within the areas targeted by this assay, and inadequate number of viral copies(<138 copies/mL). Sanaiyah Kirchhoff negative result must be combined with clinical observations, patient history, and epidemiological information. The expected result is Negative.  Fact Sheet for Patients:  BloggerCourse.com  Fact Sheet for Healthcare Providers:  SeriousBroker.it  This test is no t yet approved or cleared by the [place] FDA and  has been authorized for detection and/or diagnosis of SARS-CoV-2 by FDA under an Emergency Use Authorization (EUA). This EUA will remain  in effect (meaning this  test can be used) for the duration of the COVID-19 declaration under Section 564(b)(1) of the Act, 21 U.S.C.section 360bbb-3(b)(1), unless the authorization is terminated  or revoked sooner.       Influenza Yida Hyams by PCR NEGATIVE NEGATIVE Final   Influenza B by PCR NEGATIVE NEGATIVE Final    Comment: (NOTE) The Xpert Xpress SARS-CoV-2/FLU/RSV plus assay is intended as an aid in the diagnosis of influenza from Nasopharyngeal swab specimens and should not be used as Khara Renaud sole basis for treatment. Nasal washings and aspirates are unacceptable for Xpert Xpress SARS-CoV-2/FLU/RSV testing.  Fact Sheet for Patients: BloggerCourse.com  Fact Sheet for Healthcare Providers: SeriousBroker.it  This test is not yet approved or cleared by the [place] FDA and has been authorized for detection and/or diagnosis of SARS-CoV-2 by FDA under an Emergency Use Authorization (EUA). This EUA will remain in effect (meaning this test can be used) for the duration of the COVID-19 declaration under Section 564(b)(1) of the Act, 21 U.S.C. section 360bbb-3(b)(1), unless the authorization is terminated or revoked.  Performed at [facility], [address] [place], [place] [address]   Blood Culture (routine x 2)     Status: None   Collection Time: 03/18/21  8:20 PM   Specimen: BLOOD  Result Value Ref Range Status   Specimen Description   Final    BLOOD RIGHT ANTECUBITAL Performed at [facility], [address] [place], [place] [address]    Special Requests   Final    BOTTLES DRAWN AEROBIC AND ANAEROBIC Blood Culture adequate volume Performed at [facility], [address] [place], [place] [address]    Culture   Final    NO GROWTH 5 DAYS Performed at [facility], [address] [place], [place] [address]    Report Status 03/24/2021 FINAL  Final  Group Javan Gonzaga Strep by PCR     Status: None   Collection Time:  03/18/21  8:20 PM   Specimen: Nasopharyngeal Swab; Sterile Swab  Result Value Ref Range Status   Group Paiton Boultinghouse Strep by PCR NOT DETECTED NOT DETECTED Final    Comment: Performed at [facility], [address] [place], [place] [address]  Blood Culture (routine x 2)  Status: None   Collection Time: 03/18/21  8:24 PM   Specimen: BLOOD  Result Value Ref Range Status   Specimen Description   Final    BLOOD LEFT ANTECUBITAL Performed at [facility], [address] [place], [place] [address]    Special Requests   Final    BOTTLES DRAWN AEROBIC AND ANAEROBIC Blood Culture adequate volume Performed at [facility], [address] [place], [place] [address]    Culture   Final    NO GROWTH 5 DAYS Performed at [facility], [address], [place], [place] [address]    Report Status 03/24/2021 FINAL  Final  Urine culture     Status: Abnormal   Collection Time: 03/19/21  7:22 AM   Specimen: In/Out Cath Urine  Result Value Ref Range Status   Specimen Description   Final    IN/OUT CATH URINE Performed at [facility], [address], [place], [place] [address]    Special Requests   Final    NONE Performed at [facility], [address] [place], [place] [address]    Culture MULTIPLE SPECIES PRESENT, SUGGEST RECOLLECTION (Sriya Kroeze)  Final   Report Status 03/20/2021 FINAL  Final  Respiratory (~20 pathogens) panel by PCR     Status: Abnormal   Collection Time: 03/19/21 10:12 AM   Specimen: Nasopharyngeal Swab; Respiratory  Result Value Ref Range Status   Adenovirus NOT DETECTED NOT DETECTED Final   Coronavirus 229E NOT DETECTED NOT DETECTED Final    Comment: (NOTE) The Coronavirus on the Respiratory Panel, DOES NOT test for the novel  Coronavirus (2019 nCoV)    Coronavirus HKU1 NOT DETECTED NOT DETECTED Final   Coronavirus NL63 NOT DETECTED NOT DETECTED Final   Coronavirus OC43 DETECTED (Chyanne Kohut) NOT DETECTED Final    Metapneumovirus NOT DETECTED NOT DETECTED Final   Rhinovirus / Enterovirus NOT DETECTED NOT DETECTED Final   Influenza Nam Vossler NOT DETECTED NOT DETECTED Final   Influenza B NOT DETECTED NOT DETECTED Final   Parainfluenza Virus 1 NOT DETECTED NOT DETECTED Final   Parainfluenza Virus 2 NOT DETECTED NOT DETECTED Final   Parainfluenza Virus 3 NOT DETECTED NOT DETECTED Final   Parainfluenza Virus 4 NOT DETECTED NOT DETECTED Final   Respiratory Syncytial Virus NOT DETECTED NOT DETECTED Final   Bordetella pertussis NOT DETECTED NOT DETECTED Final   Bordetella Parapertussis NOT DETECTED NOT DETECTED Final   Chlamydophila pneumoniae NOT DETECTED NOT DETECTED Final   Mycoplasma pneumoniae NOT DETECTED NOT DETECTED Final    Comment: Performed at [facility], [address] [place], [place] [address]  Culture, blood (routine x 2)     Status: None (Preliminary result)   Collection Time: 03/23/21  5:25 PM   Specimen: BLOOD  Result Value Ref Range Status   Specimen Description   Final    BLOOD LEFT ANTECUBITAL Performed at [facility], [address], [place], [place] [address]    Special Requests   Final    BOTTLES DRAWN AEROBIC AND ANAEROBIC Blood Culture adequate volume Performed at [facility], [address], [place], [place] [address]    Culture   Final    NO GROWTH < 12 HOURS Performed at [facility], [address] [place], [place] [address]    Report Status PENDING  Incomplete  Resp Panel by RT-PCR (Flu Sadi Arave&B, Covid) Nasopharyngeal Swab     Status: Abnormal  Collection Time: 03/23/21  5:25 PM   Specimen: Nasopharyngeal Swab; Nasopharyngeal(NP) swabs in vial transport medium  Result Value Ref Range Status   SARS Coronavirus 2 by RT PCR POSITIVE (Seriah Brotzman) NEGATIVE Final    Comment: RESULT CALLED TO, READ BACK BY AND VERIFIED WITH: Bertram Millard, RN @ [phone] 03/23/21 BY SJT (NOTE) SARS-CoV-2 target nucleic acids are DETECTED.  The SARS-CoV-2 RNA  is generally detectable in upper respiratory specimens during the acute phase of infection. Positive results are indicative of the presence of the identified virus, but do not rule out bacterial infection or co-infection with other pathogens not detected by the test. Clinical correlation with patient history and other diagnostic information is necessary to determine patient infection status. The expected result is Negative.  Fact Sheet for Patients: BloggerCourse.com  Fact Sheet for Healthcare Providers: SeriousBroker.it  This test is not yet approved or cleared by the [place] FDA and  has been authorized for detection and/or diagnosis of SARS-CoV-2 by FDA under an Emergency Use Authorization (EUA).  This EUA will remain in effect (meaning this test c an be used) for the duration of  the COVID-19 declaration under Section 564(b)(1) of the Act, 21 U.S.C. section 360bbb-3(b)(1), unless the authorization is terminated or revoked sooner.     Influenza Caroll Cunnington by PCR NEGATIVE NEGATIVE Final   Influenza B by PCR NEGATIVE NEGATIVE Final    Comment: (NOTE) The Xpert Xpress SARS-CoV-2/FLU/RSV plus assay is intended as an aid in the diagnosis of influenza from Nasopharyngeal swab specimens and should not be used as Oluwademilade Kellett sole basis for treatment. Nasal washings and aspirates are unacceptable for Xpert Xpress SARS-CoV-2/FLU/RSV testing.  Fact Sheet for Patients: BloggerCourse.com  Fact Sheet for Healthcare Providers: SeriousBroker.it  This test is not yet approved or cleared by the [place] FDA and has been authorized for detection and/or diagnosis of SARS-CoV-2 by FDA under an Emergency Use Authorization (EUA). This EUA will remain in effect (meaning this test can be used) for the duration of the COVID-19 declaration under Section 564(b)(1) of the Act, 21 U.S.C. section 360bbb-3(b)(1),  unless the authorization is terminated or revoked.  Performed at [facility], [address], [place], [place] [address]   Respiratory (~20 pathogens) panel by PCR     Status: Abnormal   Collection Time: 03/23/21  6:56 PM  Result Value Ref Range Status   Adenovirus NOT DETECTED NOT DETECTED Final   Coronavirus 229E NOT DETECTED NOT DETECTED Final    Comment: (NOTE) The Coronavirus on the Respiratory Panel, DOES NOT test for the novel  Coronavirus (2019 nCoV)    Coronavirus HKU1 NOT DETECTED NOT DETECTED Final   Coronavirus NL63 NOT DETECTED NOT DETECTED Final   Coronavirus OC43 DETECTED (Clelia Trabucco) NOT DETECTED Final   Metapneumovirus NOT DETECTED NOT DETECTED Final   Rhinovirus / Enterovirus NOT DETECTED NOT DETECTED Final   Influenza [place] NOT DETECTED NOT DETECTED Final   Influenza B NOT DETECTED NOT DETECTED Final   Parainfluenza Virus 1 NOT DETECTED NOT DETECTED Final   Parainfluenza Virus 2 NOT DETECTED NOT DETECTED Final   Parainfluenza Virus 3 NOT DETECTED NOT DETECTED Final   Parainfluenza Virus 4 NOT DETECTED NOT DETECTED Final   Respiratory Syncytial Virus NOT DETECTED NOT DETECTED Final   Bordetella pertussis NOT DETECTED NOT DETECTED Final   Bordetella Parapertussis NOT DETECTED NOT DETECTED Final   Chlamydophila pneumoniae NOT DETECTED NOT DETECTED Final   Mycoplasma pneumoniae NOT DETECTED NOT DETECTED Final    Comment: Performed  at [facility], [address] [place], [place] [address]  Culture, blood (routine x 2)     Status: None (Preliminary result)   Collection Time: 03/23/21  7:37 PM   Specimen: BLOOD  Result Value Ref Range Status   Specimen Description   Final    BLOOD SITE NOT SPECIFIED Performed at [facility], [address] [place], [place] [address]    Special Requests   Final    BOTTLES DRAWN AEROBIC AND ANAEROBIC Blood Culture results may not be optimal due to an excessive volume of blood received in culture  bottles Performed at [facility], [address] [place], [place] [address]    Culture   Final    NO GROWTH < 12 HOURS Performed at [facility], [address] [place], [place] [address]    Report Status PENDING  Incomplete  MRSA PCR Screening     Status: None   Collection Time: 03/24/21 12:58 AM   Specimen: Nasal Mucosa; Nasopharyngeal  Result Value Ref Range Status   MRSA by PCR NEGATIVE NEGATIVE Final    Comment:        The GeneXpert MRSA Assay (FDA approved for NASAL specimens only), is one component of Serita Degroote comprehensive MRSA colonization surveillance program. It is not intended to diagnose MRSA infection nor to guide or monitor treatment for MRSA infections. Performed at [facility], [address] [place], [place] [address]          Radiology Studies: [facility] View  Result Date: 03/23/2021 CLINICAL DATA:  30 year old with cough. EXAM: PORTABLE CHEST 1 VIEW COMPARISON:  03/21/2021 and earlier. FINDINGS: Markedly suboptimal inspiration which accounts for bibasilar atelectasis and extension weights the cardiac silhouette. Taking this into account, cardiac silhouette likely upper normal in size to mildly enlarged. Lungs otherwise clear. Pulmonary vascularity normal. IMPRESSION: Markedly suboptimal inspiration accounts for bibasilar atelectasis. Electronically Signed   By: Hulan Saashomas  Lawrence M.D.   On: 03/23/2021 18:14        Scheduled Meds: . ALPRAZolam  0.5 mg Oral BID  . ARIPiprazole  2 mg Oral Daily  . aspirin EC  81 mg Oral Daily  . brexpiprazole  2 mg Oral Daily  . busPIRone  7.5 mg Oral BID  . Chlorhexidine Gluconate Cloth  6 each Topical Daily  . enoxaparin (LOVENOX) injection  40 mg Subcutaneous Q24H  . ferrous sulfate  325 mg Oral BID WC  . folic acid  1 mg Oral Daily  . gabapentin  300 mg Oral TID  . insulin aspart  0-15 Units Subcutaneous TID WC  . insulin aspart  0-5 Units Subcutaneous QHS  .  Ipratropium-Albuterol  1 puff Inhalation QID  . lurasidone  20 mg Oral Daily  . methylPREDNISolone (SOLU-MEDROL) injection  40 mg Intravenous Q8H  . mometasone-formoterol  2 puff Inhalation BID  . sertraline  50 mg Oral Daily  . sucralfate  1 g Oral BID AC  . traZODone  200 mg Oral QHS   Continuous Infusions: . remdesivir 100 mg in NS 100 mL Stopped (03/24/21 0942)     LOS: 1 day    Time spent: over 30 min    Lacretia Nicksaldwell Powell, MD [facility]   To contact the attending provider between 7A-7P or the covering provider during after hours 7P-7A, please log into the web site [email or web address] and access using universal [place] password for that web site. If you do not have the  password, please call the hospital operator.  03/24/2021, 11:53 AM

## 2021-03-24 NOTE — ED Notes (Signed)
ED TO INPATIENT HANDOFF REPORT  Name/Age/Gender Felicia Bradley 30 y.o. female  Code Status    Code Status Orders  (From admission, onward)         Start     Ordered   03/23/21 2249  Full code  Continuous        03/23/21 2249        Code Status History    Date Active Date Inactive Code Status Order ID Comments User Context   03/19/2021 0246 03/21/2021 2302 Full Code [id number]  John Giovanni, MD Inpatient   01/18/2020 1607 01/22/2020 1718 Full Code [id number]  Orland Mustard, MD ED   01/14/2020 0002 01/16/2020 1917 Full Code [id number]  Levora Dredge, MD ED   Advance Care Planning Activity      Home/SNF/Other Home  Chief Complaint Asthma exacerbation [J45.901]  Level of Care/Admitting Diagnosis ED Disposition    ED Disposition Condition Comment   Admit  Hospital Area: [facility] [[id number]]  Level of Care: Stepdown [14]  Admit to SDU based on following criteria: Respiratory Distress:  Frequent assessment and/or intervention to maintain adequate ventilation/respiration, pulmonary toilet, and respiratory treatment.  May admit patient to [facility] or [facility] if equivalent level of care is available:: No  Covid Evaluation: Confirmed COVID Positive  Diagnosis: Asthma exacerbation [[id number]]  Admitting Physician: Anselm Jungling [[id number]]  Attending Physician: Anselm Jungling [[id number]]  Estimated length of stay: past midnight tomorrow  Certification:: I certify this patient will need inpatient services for at least 2 midnights       Medical History Past Medical History:  Diagnosis Date  . Asthma   . Bipolar disorder (HCC)   . Diabetes mellitus without complication (HCC)    type 2  . DVT (deep venous thrombosis) (HCC) 2015   w/ recannulation per Care Everywhere  . Hypertension   . Obesity   . Pulmonary embolism (HCC) 2015    Allergies Allergies  Allergen Reactions  . Contrast Media [Iodinated Diagnostic Agents] Anaphylaxis  . Dilaudid  [Hydromorphone] Anaphylaxis  . Tramadol Itching  . Apple Hives  . Banana Hives  . Coconut Oil Hives and Itching  . Dexamethasone Nausea And Vomiting and Hives    "feels like my pubic hair is on fire"  . Other Hives    Zucchini and squash  . Solu-Medrol [Methylprednisolone] Nausea And Vomiting  . Toradol [Ketorolac Tromethamine] Hives and Itching    IV Location/Drains/Wounds Patient Lines/Drains/Airways Status    Active Line/Drains/Airways    Name Placement date Placement time Site Days   Peripheral IV 03/23/21 Left Antecubital 03/23/21  1708  Antecubital  1          Labs/Imaging Results for orders placed or performed during the hospital encounter of 03/23/21 (from the past 48 hour(s))  Comprehensive metabolic panel     Status: Abnormal   Collection Time: 03/23/21  5:25 PM  Result Value Ref Range   Sodium 142 135 - 145 mmol/L   Potassium 3.2 (L) 3.5 - 5.1 mmol/L   Chloride 103 98 - 111 mmol/L   CO2 30 22 - 32 mmol/L   Glucose, Bld 170 (H) 70 - 99 mg/dL    Comment: Glucose reference range applies only to samples taken after fasting for at least 8 hours.   BUN 21 (H) 6 - 20 mg/dL   Creatinine, Ser 7.79 0.44 - 1.00 mg/dL   Calcium 8.5 (L) 8.9 - 10.3 mg/dL   Total Protein 6.6 6.5 - 8.1 g/dL  Albumin 3.2 (L) 3.5 - 5.0 g/dL   AST 24 15 - 41 U/L   ALT 51 (H) 0 - 44 U/L   Alkaline Phosphatase 70 38 - 126 U/L   Total Bilirubin 0.3 0.3 - 1.2 mg/dL   GFR, Estimated >16 >10 mL/min    Comment: (NOTE) Calculated using the CKD-EPI Creatinine Equation (2021)    Anion gap 9 5 - 15    Comment: Performed at [facility], [address], [place], [place] [address]  CBC with Differential     Status: Abnormal   Collection Time: 03/23/21  5:25 PM  Result Value Ref Range   WBC 12.2 (H) 4.0 - 10.5 K/uL   RBC 4.07 3.87 - 5.11 MIL/uL   Hemoglobin 11.3 (L) 12.0 - 15.0 g/dL   HCT 40.9 81.1 - 91.4 %   MCV 92.1 80.0 - 100.0 fL   MCH 27.8 26.0 - 34.0 pg   MCHC 30.1 30.0 -  36.0 g/dL   RDW 78.2 95.6 - 21.3 %   Platelets 395 150 - 400 K/uL   nRBC 0.2 0.0 - 0.2 %   Neutrophils Relative % 54 %   Neutro Abs 6.5 1.7 - 7.7 K/uL   Lymphocytes Relative 41 %   Lymphs Abs 5.0 (H) 0.7 - 4.0 K/uL   Monocytes Relative 3 %   Monocytes Absolute 0.4 0.1 - 1.0 K/uL   Eosinophils Relative 1 %   Eosinophils Absolute 0.1 0.0 - 0.5 K/uL   Basophils Relative 0 %   Basophils Absolute 0.0 0.0 - 0.1 K/uL   Immature Granulocytes 1 %   Abs Immature Granulocytes 0.17 (H) 0.00 - 0.07 K/uL    Comment: Performed at [facility], [address], [place], [place] [address]  Lactic acid, plasma     Status: Abnormal   Collection Time: 03/23/21  5:25 PM  Result Value Ref Range   Lactic Acid, Venous 2.7 (HH) 0.5 - 1.9 mmol/L    Comment: CRITICAL RESULT CALLED TO, READ BACK BY AND VERIFIED WITH: CLAPP,S RN  ON 03/23/21 JACKSON,K Performed at [facility], [address] [place], [place] [address]   Troponin I (High Sensitivity)     Status: None   Collection Time: 03/23/21  5:25 PM  Result Value Ref Range   Troponin I (High Sensitivity) 3 <18 ng/L    Comment: (NOTE) Elevated high sensitivity troponin I (hsTnI) values and significant  changes across serial measurements may suggest ACS but many other  chronic and acute conditions are known to elevate hsTnI results.  Refer to the "Links" section for chest pain algorithms and additional  guidance. Performed at [facility], [address] [place], [place] [address]   Resp Panel by RT-PCR (Flu A&B, Covid) Nasopharyngeal Swab     Status: Abnormal   Collection Time: 03/23/21  5:25 PM   Specimen: Nasopharyngeal Swab; Nasopharyngeal(NP) swabs in vial transport medium  Result Value Ref Range   SARS Coronavirus 2 by RT PCR POSITIVE (A) NEGATIVE    Comment: RESULT CALLED TO, READ BACK BY AND VERIFIED WITH: Bertram Millard, RN @ [phone] 03/23/21 BY SJT (NOTE) SARS-CoV-2 target nucleic  acids are DETECTED.  The SARS-CoV-2 RNA is generally detectable in upper respiratory specimens during the acute phase of infection. Positive results are indicative of the presence of the identified virus, but do not rule out bacterial infection or co-infection with other pathogens not detected by the test. Clinical correlation with patient history and  other diagnostic information is necessary to determine patient infection status. The expected result is Negative.  Fact Sheet for Patients: BloggerCourse.com  Fact Sheet for Healthcare Providers: SeriousBroker.it  This test is not yet approved or cleared by the [place] FDA and  has been authorized for detection and/or diagnosis of SARS-CoV-2 by FDA under an Emergency Use Authorization (EUA).  This EUA will remain in effect (meaning this test c an be used) for the duration of  the COVID-19 declaration under Section 564(b)(1) of the Act, 21 U.S.C. section 360bbb-3(b)(1), unless the authorization is terminated or revoked sooner.     Influenza A by PCR NEGATIVE NEGATIVE   Influenza B by PCR NEGATIVE NEGATIVE    Comment: (NOTE) The Xpert Xpress SARS-CoV-2/FLU/RSV plus assay is intended as an aid in the diagnosis of influenza from Nasopharyngeal swab specimens and should not be used as a sole basis for treatment. Nasal washings and aspirates are unacceptable for Xpert Xpress SARS-CoV-2/FLU/RSV testing.  Fact Sheet for Patients: BloggerCourse.com  Fact Sheet for Healthcare Providers: SeriousBroker.it  This test is not yet approved or cleared by the [place] FDA and has been authorized for detection and/or diagnosis of SARS-CoV-2 by FDA under an Emergency Use Authorization (EUA). This EUA will remain in effect (meaning this test can be used) for the duration of the COVID-19 declaration under Section 564(b)(1) of the Act, 21  U.S.C. section 360bbb-3(b)(1), unless the authorization is terminated or revoked.  Performed at [facility], [address] [place], [place] [address]   Lactic acid, plasma     Status: Abnormal   Collection Time: 03/23/21  6:00 PM  Result Value Ref Range   Lactic Acid, Venous 2.5 (HH) 0.5 - 1.9 mmol/L    Comment: CRITICAL RESULT CALLED TO, READ BACK BY AND VERIFIED WITH: Porfirio Oar RN 03/23/21 @ 1902 BY P.HENDERSON Performed at [facility], [address] [place], [place] [address]   Respiratory (~20 pathogens) panel by PCR     Status: Abnormal   Collection Time: 03/23/21  6:56 PM  Result Value Ref Range   Adenovirus NOT DETECTED NOT DETECTED   Coronavirus 229E NOT DETECTED NOT DETECTED    Comment: (NOTE) The Coronavirus on the Respiratory Panel, DOES NOT test for the novel  Coronavirus (2019 nCoV)    Coronavirus HKU1 NOT DETECTED NOT DETECTED   Coronavirus NL63 NOT DETECTED NOT DETECTED   Coronavirus OC43 DETECTED (A) NOT DETECTED   Metapneumovirus NOT DETECTED NOT DETECTED   Rhinovirus / Enterovirus NOT DETECTED NOT DETECTED   Influenza A NOT DETECTED NOT DETECTED   Influenza B NOT DETECTED NOT DETECTED   Parainfluenza Virus 1 NOT DETECTED NOT DETECTED   Parainfluenza Virus 2 NOT DETECTED NOT DETECTED   Parainfluenza Virus 3 NOT DETECTED NOT DETECTED   Parainfluenza Virus 4 NOT DETECTED NOT DETECTED   Respiratory Syncytial Virus NOT DETECTED NOT DETECTED   Bordetella pertussis NOT DETECTED NOT DETECTED   Bordetella Parapertussis NOT DETECTED NOT DETECTED   Chlamydophila pneumoniae NOT DETECTED NOT DETECTED   Mycoplasma pneumoniae NOT DETECTED NOT DETECTED    Comment: Performed at [facility], [address], [place], [place] [address]  Culture, blood (routine x 2)     Status: None (Preliminary result)   Collection Time: 03/23/21  7:37 PM   Specimen: BLOOD  Result Value Ref Range   Specimen Description      BLOOD SITE  NOT SPECIFIED Performed at [facility], [address], [place], [place] [address]    Special Requests      BOTTLES DRAWN AEROBIC AND ANAEROBIC Blood Culture results may not be optimal due to an excessive volume of blood received in culture bottles Performed at [facility], [address] [place], [place] [address]    Culture PENDING    Report Status PENDING   D-dimer, quantitative     Status: None   Collection Time: 03/23/21  7:37 PM  Result Value Ref Range   D-Dimer, Quant 0.49 0.00 - 0.50 ug/mL-FEU    Comment: (NOTE) At the manufacturer cut-off value of 0.5 g/mL FEU, this assay has a negative predictive value of 95-100%.This assay is intended for use in conjunction with a clinical pretest probability (PTP) assessment model to exclude pulmonary embolism (PE) and deep venous thrombosis (DVT) in outpatients suspected of PE or DVT. Results should be correlated with clinical presentation. Performed at [facility], [address], [place], [place] [address]   Troponin I (High Sensitivity)     Status: None   Collection Time: 03/23/21  7:37 PM  Result Value Ref Range   Troponin I (High Sensitivity) 3 <18 ng/L    Comment: (NOTE) Elevated high sensitivity troponin I (hsTnI) values and significant  changes across serial measurements may suggest ACS but many other  chronic and acute conditions are known to elevate hsTnI results.  Refer to the "Links" section for chest pain algorithms and additional  guidance. Performed at [facility], [address] [place], [place] [address]    DG Chest Port 1 View  Result Date: 03/23/2021 CLINICAL DATA:  30 year old with cough. EXAM: PORTABLE CHEST 1 VIEW COMPARISON:  03/21/2021 and earlier. FINDINGS: Markedly suboptimal inspiration which accounts for bibasilar atelectasis and extension weights the cardiac silhouette. Taking this into account, cardiac silhouette likely upper normal  in size to mildly enlarged. Lungs otherwise clear. Pulmonary vascularity normal. IMPRESSION: Markedly suboptimal inspiration accounts for bibasilar atelectasis. Electronically Signed   By: Hulan Saas M.D.   On: 03/23/2021 18:14    Pending Labs Unresulted Labs (From admission, onward)          Start     Ordered   03/24/21 0500  Basic metabolic panel  Tomorrow morning,   R        03/23/21 2249   03/24/21 0500  CBC  Tomorrow morning,   R        03/23/21 2249   03/23/21 2307  Blood gas, venous  Once,   R        03/23/21 2306   03/23/21 2259  Lactic acid, plasma  STAT Now then every 3 hours,   R (with STAT occurrences)      03/23/21 2258   03/23/21 2253  Gastrointestinal Panel by PCR , Stool  (Gastrointestinal Panel by PCR, Stool                                                                                                                                                     **  Does Not include CLOSTRIDIUM DIFFICILE testing. **If CDIFF testing is needed, place order from the "C Difficile Testing" order set.**)  Once,   STAT        03/23/21 2253   03/23/21 2250  C-reactive protein  Once,   STAT        03/23/21 2250   03/23/21 2250  Procalcitonin - Baseline  ONCE - STAT,   STAT        03/23/21 2250   03/23/21 1709  Culture, blood (routine x 2)  BLOOD CULTURE X 2,   STAT      03/23/21 1709          Vitals/Pain Today's Vitals   03/23/21 2200 03/23/21 2313 03/23/21 2345 03/24/21 0015  BP: 138/88 (!) 113/95 (!) 111/50 95/67  Pulse: 81 80 81 78  Resp: Temp:  98.7 F (37.1 C)  98.7 F (37.1 C)  TempSrc:  Oral  Oral  SpO2: 98% 98% 100% 98%  PainSc:        Isolation Precautions Enteric precautions (UV disinfection)  Medications Medications  enoxaparin (LOVENOX) injection 40 mg (40 mg Subcutaneous Given 03/23/21 2328)  magnesium sulfate IVPB 2 g 50 mL (has no administration in time range)  0.9 %  sodium chloride infusion ( Intravenous New Bag/Given 03/23/21 2334)   loperamide (IMODIUM) capsule 2 mg (has no administration in time range)  ondansetron (ZOFRAN) injection 4 mg (has no administration in time range)  Ipratropium-Albuterol (COMBIVENT) respimat 1 puff (1 puff Inhalation Given 03/24/21 0011)  insulin aspart (novoLOG) injection 0-15 Units (has no administration in time range)  insulin aspart (novoLOG) injection 0-5 Units (0 Units Subcutaneous Not Given 03/24/21 0013)  remdesivir 100 mg in sodium chloride 0.9 % 100 mL IVPB (100 mg Intravenous New Bag/Given 03/23/21 2335)    Followed by  remdesivir 100 mg in sodium chloride 0.9 % 100 mL IVPB (100 mg Intravenous New Bag/Given 03/24/21 0011)    Followed by  remdesivir 100 mg in sodium chloride 0.9 % 100 mL IVPB (has no administration in time range)  methylPREDNISolone sodium succinate (SOLU-MEDROL) 40 mg/mL injection 40 mg (has no administration in time range)  acetaminophen (TYLENOL) tablet 500 mg (has no administration in time range)  aspirin EC tablet 81 mg (has no administration in time range)  ALPRAZolam (XANAX) tablet 0.5 mg (0.5 mg Oral Given 03/23/21 2350)  ARIPiprazole (ABILIFY) tablet 2 mg (has no administration in time range)  brexpiprazole (REXULTI) tablet 2 mg (has no administration in time range)  busPIRone (BUSPAR) tablet 7.5 mg (7.5 mg Oral Given 03/24/21 0011)  lurasidone (LATUDA) tablet 20 mg (has no administration in time range)  sertraline (ZOLOFT) tablet 50 mg (has no administration in time range)  traZODone (DESYREL) tablet 200 mg (200 mg Oral Given 03/23/21 2349)  sucralfate (CARAFATE) 1 GM/10ML suspension 1 g (has no administration in time range)  ferrous sulfate tablet 325 mg (has no administration in time range)  folic acid (FOLVITE) tablet 1 mg (has no administration in time range)  gabapentin (NEURONTIN) capsule 300 mg (300 mg Oral Given 03/23/21 2350)  mometasone-formoterol (DULERA) 200-5 MCG/ACT inhaler 2 puff (2 puffs Inhalation Given 03/24/21 0011)  oxyCODONE-acetaminophen  (PERCOCET/ROXICET) 5-325 MG per tablet 1 tablet (1 tablet Oral Given 03/23/21 2349)    And  oxyCODONE (Oxy IR/ROXICODONE) immediate release tablet 5 mg (5 mg Oral Given 03/23/21 2349)  sodium chloride 0.9 % bolus 500 mL (0 mLs Intravenous Stopped 03/23/21 1925)  ipratropium-albuterol (DUONEB) 0.5-2.5 (3)  MG/3ML nebulizer solution 3 mL (3 mLs Nebulization Given 03/23/21 1719)  sodium chloride 0.9 % bolus 500 mL (0 mLs Intravenous Stopped 03/23/21 2055)  albuterol (VENTOLIN HFA) 108 (90 Base) MCG/ACT inhaler 4 puff (4 puffs Inhalation Given 03/23/21 2055)  acetaminophen (TYLENOL) tablet 650 mg (650 mg Oral Given 03/23/21 2055)  potassium chloride SA (KLOR-CON) CR tablet 40 mEq (40 mEq Oral Given 03/23/21 2354)    Mobility walks

## 2021-03-25 LAB — IRON AND TIBC
Iron: 40 ug/dL (ref 28–170)
Saturation Ratios: 12 % (ref 10.4–31.8)
TIBC: 333 ug/dL (ref 250–450)
UIBC: 293 ug/dL

## 2021-03-25 LAB — CBC WITH DIFFERENTIAL/PLATELET
Abs Immature Granulocytes: 0.13 10*3/uL — ABNORMAL HIGH (ref 0.00–0.07)
Basophils Absolute: 0 10*3/uL (ref 0.0–0.1)
Basophils Relative: 0 %
Eosinophils Absolute: 0 10*3/uL (ref 0.0–0.5)
Eosinophils Relative: 0 %
HCT: 36.6 % (ref 36.0–46.0)
Hemoglobin: 10.7 g/dL — ABNORMAL LOW (ref 12.0–15.0)
Immature Granulocytes: 1 %
Lymphocytes Relative: 12 %
Lymphs Abs: 1.4 10*3/uL (ref 0.7–4.0)
MCH: 27 pg (ref 26.0–34.0)
MCHC: 29.2 g/dL — ABNORMAL LOW (ref 30.0–36.0)
MCV: 92.4 fL (ref 80.0–100.0)
Monocytes Absolute: 0.1 10*3/uL (ref 0.1–1.0)
Monocytes Relative: 1 %
Neutro Abs: 9.7 10*3/uL — ABNORMAL HIGH (ref 1.7–7.7)
Neutrophils Relative %: 86 %
Platelets: 344 10*3/uL (ref 150–400)
RBC: 3.96 MIL/uL (ref 3.87–5.11)
RDW: 15.2 % (ref 11.5–15.5)
WBC: 11.4 10*3/uL — ABNORMAL HIGH (ref 4.0–10.5)
nRBC: 0 % (ref 0.0–0.2)

## 2021-03-25 LAB — COMPREHENSIVE METABOLIC PANEL
ALT: 46 U/L — ABNORMAL HIGH (ref 0–44)
AST: 19 U/L (ref 15–41)
Albumin: 3.1 g/dL — ABNORMAL LOW (ref 3.5–5.0)
Alkaline Phosphatase: 56 U/L (ref 38–126)
Anion gap: 9 (ref 5–15)
BUN: 14 mg/dL (ref 6–20)
CO2: 26 mmol/L (ref 22–32)
Calcium: 8.4 mg/dL — ABNORMAL LOW (ref 8.9–10.3)
Chloride: 103 mmol/L (ref 98–111)
Creatinine, Ser: 0.66 mg/dL (ref 0.44–1.00)
GFR, Estimated: >60 mL/min (ref >60)
Glucose, Bld: 231 mg/dL — ABNORMAL HIGH (ref 70–99)
Potassium: 4.8 mmol/L (ref 3.5–5.1)
Sodium: 138 mmol/L (ref 135–145)
Total Bilirubin: 0.4 mg/dL (ref 0.3–1.2)
Total Protein: 6.4 g/dL — ABNORMAL LOW (ref 6.5–8.1)

## 2021-03-25 LAB — GLUCOSE, CAPILLARY
Glucose-Capillary: 203 mg/dL — ABNORMAL HIGH (ref 70–99)
Glucose-Capillary: 386 mg/dL — ABNORMAL HIGH (ref 70–99)
Glucose-Capillary: 309 mg/dL — ABNORMAL HIGH (ref 70–99)
Glucose-Capillary: 335 mg/dL — ABNORMAL HIGH (ref 70–99)

## 2021-03-25 LAB — VITAMIN B12: Vitamin B-12: 263 pg/mL (ref 180–914)

## 2021-03-25 LAB — MAGNESIUM: Magnesium: 2.5 mg/dL — ABNORMAL HIGH (ref 1.7–2.4)

## 2021-03-25 LAB — FOLATE: Folate: 7.2 ng/mL (ref >5.9)

## 2021-03-25 LAB — C-REACTIVE PROTEIN: CRP: 2.3 mg/dL — ABNORMAL HIGH (ref <1.0)

## 2021-03-25 LAB — PHOSPHORUS: Phosphorus: 3.2 mg/dL (ref 2.5–4.6)

## 2021-03-25 LAB — FERRITIN: Ferritin: 34 ng/mL (ref 11–307)

## 2021-03-25 NOTE — Progress Notes (Signed)
PROGRESS NOTE    Felicia Bradley  ZOX:[id number] DOB: 1991/01/11 DOA: 03/23/2021 PCP: [facility]   Brief Narrative:  Felicia Bradley is a 30 y.o. female with medical history significant for COPD/severe persistent asthma with chronic hypoxemia on 4 L, OSA on CPAP, history of DVT/PE, CKD, GERD, PTSD/bipolar disorder, history of heroin abuse who presents with concerns of increasing shortness of breath, dizziness and diarrhea. Patient was recently hospitalized from 4/5-4/8 for asthma exacerbation thought secondary to coronavirus OC43.  Patient was discharged on 4 L O2 supplementation and steroid taper per pulmonology recommendation.  She was readmitted with asthma/COPD exacerbation, now with COVID 19 infection.    Assessment & Plan:   Principal Problem:   Asthma exacerbation Active Problems:   OSA (obstructive sleep apnea)   Acute respiratory disease due to COVID-19 virus   Hypokalemia   Hypotension   History of DVT (deep vein thrombosis)   History of pulmonary embolus (PE)   PTSD (post-traumatic stress disorder)   Bipolar 1 disorder (HCC)   BMI 50.0-59.9, adult (HCC)  Acute Asthma/COPD Exacerbation  Chronic Hypoxic Respiratory failure  Sars CoV 2 Infection  COVID 19  Coronavirus OC43 Virus Infection Recently discharged on 4/8 after treatment for exacerbation in setting of coronavirus - Positive 01/2020 - negative in the interim. Now positive again 03/23/21 Readmitted 4/10 with COVID 19 with exacerbation of her respiratory disease SpO2: 95 % O2 Flow Rate (L/min): 5 L/min Continue solumedrol.  Continue scheduled and prn inhalers (combivent, albuterol).  Continue dulera. Remdesivir x5 days Agreeable for Actemra use but given minimal symptoms/inflammatory markers will hold off for now. Recent Labs    03/23/21 1937 03/23/21 2336 03/25/21 0226  DDIMER 0.49  --   --   FERRITIN  --   --  34  CRP  --  1.2* 2.3*   Hypotension Seems improved with IVF,  follow  Diarrhea Likely related to covid, follow GI path panel/c diff  OSA Plan for cpap nightly  Hx DVT/PE Anticoagulation was d/c'd. (See 09/06/2019 note from hematology).   She seemed to think she'd been restarted recently, but no indication of this I can see.  Discussed with pharmacy.  Follow outpatient. Negative D dimer reassuringly  T2DM, uncontrolled a1c 6.2 SSI - adjust as needed  PTSD Continue home xanax, abilify, buspar, latuda, trazodone  Anemia, chronic - unclear etiology Likely component of dilution, follow anemia labs  Obesity complicates prognosis and care Body mass index is 53.78 kg/m.   DVT prophylaxis: lovenox Code Status: Full  Family Communication: None present Disposition:   Status is: Inpatient  Dispo: The patient is from: Home              Anticipated d/c is to: Home              Patient currently is not medically stable to d/c.   Difficult to place patient No  Consultants:   none  Procedures:  none  Antimicrobials:  Anti-infectives (From admission, onward)   Start     Dose/Rate Route Frequency Ordered Stop   03/24/21 1000  remdesivir 100 mg in sodium chloride 0.9 % 100 mL IVPB       "Followed by" Linked Group Details   100 mg 200 mL/hr over 30 Minutes Intravenous Daily 03/23/21 2315 03/28/21 0959   03/24/21 0000  remdesivir 100 mg in sodium chloride 0.9 % 100 mL IVPB       "Followed by" Linked Group Details   100 mg 200 mL/hr over  30 Minutes Intravenous  Once 03/23/21 2315 03/24/21 0137   03/23/21 2330  remdesivir 100 mg in sodium chloride 0.9 % 100 mL IVPB       "Followed by" Linked Group Details   100 mg 200 mL/hr over 30 Minutes Intravenous  Once 03/23/21 2315 03/24/21 0115      Subjective: No acute issues or events overnight, respiratory status improving but not yet back to baseline, cough ongoing but denies chest pain headache fevers or chills.  Objective: Vitals:   03/25/21 0200 03/25/21 0300 03/25/21 0400 03/25/21  0453  BP: (!) 145/65 121/65 131/79   Pulse: 60 (!) 56 (!) 57   Resp: 14 15 16    Temp:   98 F (36.7 C)   TempSrc:   Oral   SpO2: 97% 96% 95%   Weight:    (!) 137.7 kg    Intake/Output Summary (Last 24 hours) at 03/25/2021 0729 Last data filed at 03/24/2021 2253 Gross per 24 hour  Intake 1166.6 ml  Output 1325 ml  Net -158.4 ml   Filed Weights   03/24/21 0600 03/25/21 0453  Weight: 134.4 kg (!) 137.7 kg    Examination:  General:  Pleasantly resting in bed, No acute distress. HEENT:  Normocephalic atraumatic.  Sclerae nonicteric, noninjected.  Extraocular movements intact bilaterally. Neck:  Without mass or deformity.  Trachea is midline. Lungs: Bilateral rhonchi without overt wheeze or rales Heart:  Regular rate and rhythm.  Without murmurs, rubs, or gallops. Abdomen:  Soft, nontender, nondistended.  Without guarding or rebound. Extremities: Without cyanosis, clubbing, edema, or obvious deformity. Vascular:  Dorsalis pedis and posterior tibial pulses palpable bilaterally. Skin:  Warm and dry, no erythema, no ulcerations.   Data Reviewed: I have personally reviewed following labs and imaging studies  CBC: Recent Labs  Lab 03/19/21 0344 03/20/21 0356 03/21/21 0416 03/23/21 1725 03/24/21 0240 03/25/21 0226  WBC 7.4 14.1* 13.1* 12.2* 11.2* 11.4*  NEUTROABS 6.7 12.2* 10.9* 6.5  --  9.7*  HGB 10.6* 10.4* 10.3* 11.3* 9.4* 10.7*  HCT 36.3 34.9* 35.8* 37.5 32.4* 36.6  MCV 92.6 93.3 94.7 92.1 92.6 92.4  PLT 410* 433* 406* 395 330 344    Basic Metabolic Panel: Recent Labs  Lab 03/19/21 0344 03/20/21 0356 03/21/21 0416 03/23/21 1725 03/24/21 0240 03/25/21 0226  NA 145 139 142 142 140 138  K 3.4*  3.4* 4.8 4.5 3.2* 3.6 4.8  CL 111 109 110 103 103 103  CO2 23 23 25 30 28 26   GLUCOSE 227* 244* 247* 170* 114* 231*  BUN 7 10 15  21* 15 14  CREATININE 0.68 0.64 0.69 0.80 0.70 0.66  CALCIUM 8.8* 8.8* 9.1 8.5* 8.0* 8.4*  MG 2.2  2.2 2.2 2.4  --   --  2.5*  PHOS 2.5  2.0* 2.3*  --   --  3.2    GFR: Estimated Creatinine Clearance: 140.4 mL/min (by C-G formula based on SCr of 0.66 mg/dL).  Liver Function Tests: Recent Labs  Lab 03/19/21 0344 03/20/21 0356 03/21/21 0416 03/23/21 1725 03/25/21 0226  AST 27 35 17 24 19   ALT 23 29 26  51* 46*  ALKPHOS 57 58 51 70 56  BILITOT 0.2* 0.4 0.4 0.3 0.4  PROT 7.2 7.0 6.9 6.6 6.4*  ALBUMIN 3.4* 3.3* 3.3* 3.2* 3.1*    CBG: Recent Labs  Lab 03/21/21 1655 03/24/21 0755 03/24/21 1124 03/24/21 1619 03/24/21 2156  GLUCAP 279* 82 106* 269* 323*     Recent Results (from the past 240  hour(s))  Resp Panel by RT-PCR (Flu A&B, Covid) Nasopharyngeal Swab     Status: None   Collection Time: 03/18/21  8:20 PM   Specimen: Nasopharyngeal Swab; Nasopharyngeal(NP) swabs in vial transport medium  Result Value Ref Range Status   SARS Coronavirus 2 by RT PCR NEGATIVE NEGATIVE Final    Comment: (NOTE) SARS-CoV-2 target nucleic acids are NOT DETECTED.  The SARS-CoV-2 RNA is generally detectable in upper respiratory specimens during the acute phase of infection. The lowest concentration of SARS-CoV-2 viral copies this assay can detect is 138 copies/mL. A negative result does not preclude SARS-Cov-2 infection and should not be used as the sole basis for treatment or other patient management decisions. A negative result may occur with  improper specimen collection/handling, submission of specimen other than nasopharyngeal swab, presence of viral mutation(s) within the areas targeted by this assay, and inadequate number of viral copies(<138 copies/mL). A negative result must be combined with clinical observations, patient history, and epidemiological information. The expected result is Negative.  Fact Sheet for Patients:  BloggerCourse.com  Fact Sheet for Healthcare Providers:  SeriousBroker.it  This test is no t yet approved or cleared by the [place] FDA  and  has been authorized for detection and/or diagnosis of SARS-CoV-2 by FDA under an Emergency Use Authorization (EUA). This EUA will remain  in effect (meaning this test can be used) for the duration of the COVID-19 declaration under Section 564(b)(1) of the Act, 21 U.S.C.section 360bbb-3(b)(1), unless the authorization is terminated  or revoked sooner.       Influenza A by PCR NEGATIVE NEGATIVE Final   Influenza B by PCR NEGATIVE NEGATIVE Final    Comment: (NOTE) The Xpert Xpress SARS-CoV-2/FLU/RSV plus assay is intended as an aid in the diagnosis of influenza from Nasopharyngeal swab specimens and should not be used as a sole basis for treatment. Nasal washings and aspirates are unacceptable for Xpert Xpress SARS-CoV-2/FLU/RSV testing.  Fact Sheet for Patients: BloggerCourse.com  Fact Sheet for Healthcare Providers: SeriousBroker.it  This test is not yet approved or cleared by the [place] FDA and has been authorized for detection and/or diagnosis of SARS-CoV-2 by FDA under an Emergency Use Authorization (EUA). This EUA will remain in effect (meaning this test can be used) for the duration of the COVID-19 declaration under Section 564(b)(1) of the Act, 21 U.S.C. section 360bbb-3(b)(1), unless the authorization is terminated or revoked.  Performed at [facility], [address] [place], [place] [address]   Blood Culture (routine x 2)     Status: None   Collection Time: 03/18/21  8:20 PM   Specimen: BLOOD  Result Value Ref Range Status   Specimen Description   Final    BLOOD RIGHT ANTECUBITAL Performed at [facility], [address] [place], [place] [address]    Special Requests   Final    BOTTLES DRAWN AEROBIC AND ANAEROBIC Blood Culture adequate volume Performed at [facility], [address] [place], [place] [address]    Culture   Final    NO GROWTH 5 DAYS Performed at  [facility], [address], [place], [place] [address]    Report Status 03/24/2021 FINAL  Final  Group A Strep by PCR     Status: None   Collection Time: 03/18/21  8:20 PM   Specimen: Nasopharyngeal Swab; Sterile Swab  Result Value Ref Range Status   Group A Strep by PCR NOT DETECTED NOT DETECTED  Final    Comment: Performed at [facility], [address] [place], [place] [address]  Blood Culture (routine x 2)     Status: None   Collection Time: 03/18/21  8:24 PM   Specimen: BLOOD  Result Value Ref Range Status   Specimen Description   Final    BLOOD LEFT ANTECUBITAL Performed at [facility], [address] [place], [place] [address]    Special Requests   Final    BOTTLES DRAWN AEROBIC AND ANAEROBIC Blood Culture adequate volume Performed at [facility], [address] [place], [place] [address]    Culture   Final    NO GROWTH 5 DAYS Performed at [facility], [address] [place], [place] [address]    Report Status 03/24/2021 FINAL  Final  Urine culture     Status: Abnormal   Collection Time: 03/19/21  7:22 AM   Specimen: In/Out Cath Urine  Result Value Ref Range Status   Specimen Description   Final    IN/OUT CATH URINE Performed at [facility], [address], [place], [place] [address]    Special Requests   Final    NONE Performed at [facility], [address], [place], [place] [address]    Culture MULTIPLE SPECIES PRESENT, SUGGEST RECOLLECTION (A)  Final   Report Status 03/20/2021 FINAL  Final  Respiratory (~20 pathogens) panel by PCR     Status: Abnormal   Collection Time: 03/19/21 10:12 AM   Specimen: Nasopharyngeal Swab; Respiratory  Result Value Ref Range Status   Adenovirus NOT DETECTED NOT DETECTED Final   Coronavirus 229E NOT DETECTED NOT DETECTED Final    Comment: (NOTE) The Coronavirus on the Respiratory Panel, DOES NOT test for the novel  Coronavirus (2019  nCoV)    Coronavirus HKU1 NOT DETECTED NOT DETECTED Final   Coronavirus NL63 NOT DETECTED NOT DETECTED Final   Coronavirus OC43 DETECTED (A) NOT DETECTED Final   Metapneumovirus NOT DETECTED NOT DETECTED Final   Rhinovirus / Enterovirus NOT DETECTED NOT DETECTED Final   Influenza A NOT DETECTED NOT DETECTED Final   Influenza B NOT DETECTED NOT DETECTED Final   Parainfluenza Virus 1 NOT DETECTED NOT DETECTED Final   Parainfluenza Virus 2 NOT DETECTED NOT DETECTED Final   Parainfluenza Virus 3 NOT DETECTED NOT DETECTED Final   Parainfluenza Virus 4 NOT DETECTED NOT DETECTED Final   Respiratory Syncytial Virus NOT DETECTED NOT DETECTED Final   Bordetella pertussis NOT DETECTED NOT DETECTED Final   Bordetella Parapertussis NOT DETECTED NOT DETECTED Final   Chlamydophila pneumoniae NOT DETECTED NOT DETECTED Final   Mycoplasma pneumoniae NOT DETECTED NOT DETECTED Final    Comment: Performed at [facility], [address] [place], [place] [address]  Culture, blood (routine x 2)     Status: None (Preliminary result)   Collection Time: 03/23/21  5:25 PM   Specimen: BLOOD  Result Value Ref Range Status   Specimen Description   Final    BLOOD LEFT ANTECUBITAL Performed at [facility], [address] [place], [place] [address]    Special Requests   Final    BOTTLES DRAWN AEROBIC AND ANAEROBIC Blood Culture adequate volume Performed at [facility], [address] [place], [place] [address]    Culture   Final    NO GROWTH < 12 HOURS Performed at [facility], [address],  [place], [place] [address]    Report Status PENDING  Incomplete  Resp Panel by RT-PCR (Flu A&B, Covid) Nasopharyngeal Swab     Status: Abnormal   Collection Time: 03/23/21  5:25 PM   Specimen: Nasopharyngeal Swab; Nasopharyngeal(NP) swabs in vial transport medium  Result Value Ref Range Status   SARS Coronavirus 2 by RT PCR POSITIVE (A) NEGATIVE Final    Comment:  RESULT CALLED TO, READ BACK BY AND VERIFIED WITH: Bertram MillardHARRIS, KIRA, RN @ [phone] 03/23/21 BY SJT (NOTE) SARS-CoV-2 target nucleic acids are DETECTED.  The SARS-CoV-2 RNA is generally detectable in upper respiratory specimens during the acute phase of infection. Positive results are indicative of the presence of the identified virus, but do not rule out bacterial infection or co-infection with other pathogens not detected by the test. Clinical correlation with patient history and other diagnostic information is necessary to determine patient infection status. The expected result is Negative.  Fact Sheet for Patients: BloggerCourse.comhttps://www.fda.gov/media/152166/download  Fact Sheet for Healthcare Providers: SeriousBroker.ithttps://www.fda.gov/media/152162/download  This test is not yet approved or cleared by the [place] FDA and  has been authorized for detection and/or diagnosis of SARS-CoV-2 by FDA under an Emergency Use Authorization (EUA).  This EUA will remain in effect (meaning this test c an be used) for the duration of  the COVID-19 declaration under Section 564(b)(1) of the Act, 21 U.S.C. section 360bbb-3(b)(1), unless the authorization is terminated or revoked sooner.     Influenza A by PCR NEGATIVE NEGATIVE Final   Influenza B by PCR NEGATIVE NEGATIVE Final    Comment: (NOTE) The Xpert Xpress SARS-CoV-2/FLU/RSV plus assay is intended as an aid in the diagnosis of influenza from Nasopharyngeal swab specimens and should not be used as a sole basis for treatment. Nasal washings and aspirates are unacceptable for Xpert Xpress SARS-CoV-2/FLU/RSV testing.  Fact Sheet for Patients: BloggerCourse.comhttps://www.fda.gov/media/152166/download  Fact Sheet for Healthcare Providers: SeriousBroker.ithttps://www.fda.gov/media/152162/download  This test is not yet approved or cleared by the [place] FDA and has been authorized for detection and/or diagnosis of SARS-CoV-2 by FDA under an Emergency Use Authorization (EUA). This EUA will  remain in effect (meaning this test can be used) for the duration of the COVID-19 declaration under Section 564(b)(1) of the Act, 21 U.S.C. section 360bbb-3(b)(1), unless the authorization is terminated or revoked.  Performed at [facility], [address] [place], [place] [address]   Respiratory (~20 pathogens) panel by PCR     Status: Abnormal   Collection Time: 03/23/21  6:56 PM  Result Value Ref Range Status   Adenovirus NOT DETECTED NOT DETECTED Final   Coronavirus 229E NOT DETECTED NOT DETECTED Final    Comment: (NOTE) The Coronavirus on the Respiratory Panel, DOES NOT test for the novel  Coronavirus (2019 nCoV)    Coronavirus HKU1 NOT DETECTED NOT DETECTED Final   Coronavirus NL63 NOT DETECTED NOT DETECTED Final   Coronavirus OC43 DETECTED (A) NOT DETECTED Final   Metapneumovirus NOT DETECTED NOT DETECTED Final   Rhinovirus / Enterovirus NOT DETECTED NOT DETECTED Final   Influenza A NOT DETECTED NOT DETECTED Final   Influenza B NOT DETECTED NOT DETECTED Final   Parainfluenza Virus 1 NOT DETECTED NOT DETECTED Final   Parainfluenza Virus 2 NOT DETECTED NOT DETECTED Final   Parainfluenza Virus 3 NOT DETECTED NOT DETECTED Final   Parainfluenza Virus 4 NOT DETECTED NOT DETECTED Final   Respiratory Syncytial Virus NOT DETECTED NOT DETECTED Final   Bordetella pertussis NOT DETECTED NOT DETECTED Final   Bordetella  Parapertussis NOT DETECTED NOT DETECTED Final   Chlamydophila pneumoniae NOT DETECTED NOT DETECTED Final   Mycoplasma pneumoniae NOT DETECTED NOT DETECTED Final    Comment: Performed at [facility], [address] [place], [place] [address]  Culture, blood (routine x 2)     Status: None (Preliminary result)   Collection Time: 03/23/21  7:37 PM   Specimen: BLOOD  Result Value Ref Range Status   Specimen Description   Final    BLOOD SITE NOT SPECIFIED Performed at [facility], [address], [place], [place] [address]    Special  Requests   Final    BOTTLES DRAWN AEROBIC AND ANAEROBIC Blood Culture results may not be optimal due to an excessive volume of blood received in culture bottles Performed at [facility], [address] [place], [place] [address]    Culture   Final    NO GROWTH < 12 HOURS Performed at [facility], [address] [place], [place] [address]    Report Status PENDING  Incomplete  MRSA PCR Screening     Status: None   Collection Time: 03/24/21 12:58 AM   Specimen: Nasal Mucosa; Nasopharyngeal  Result Value Ref Range Status   MRSA by PCR NEGATIVE NEGATIVE Final    Comment:        The GeneXpert MRSA Assay (FDA approved for NASAL specimens only), is one component of a comprehensive MRSA colonization surveillance program. It is not intended to diagnose MRSA infection nor to guide or monitor treatment for MRSA infections. Performed at [facility], [address], [place], [place] [address]          Radiology Studies: [facility] Chest Port 1 View  Result Date: 03/23/2021 CLINICAL DATA:  30 year old with cough. EXAM: PORTABLE CHEST 1 VIEW COMPARISON:  03/21/2021 and earlier. FINDINGS: Markedly suboptimal inspiration which accounts for bibasilar atelectasis and extension weights the cardiac silhouette. Taking this into account, cardiac silhouette likely upper normal in size to mildly enlarged. Lungs otherwise clear. Pulmonary vascularity normal. IMPRESSION: Markedly suboptimal inspiration accounts for bibasilar atelectasis. Electronically Signed   By: Hulan Saas M.D.   On: 03/23/2021 18:14        Scheduled Meds: . (feeding supplement) PROSource Plus  30 mL Oral BID BM  . ALPRAZolam  0.5 mg Oral BID  . ARIPiprazole  2 mg Oral Daily  . aspirin EC  81 mg Oral Daily  . brexpiprazole  2 mg Oral Daily  . busPIRone  7.5 mg Oral BID  . Chlorhexidine Gluconate Cloth  6 each Topical Daily  . enoxaparin (LOVENOX) injection  60 mg Subcutaneous  Q24H  . ferrous sulfate  325 mg Oral BID WC  . folic acid  1 mg Oral Daily  . gabapentin  300 mg Oral TID  . insulin aspart  0-15 Units Subcutaneous TID WC  . insulin aspart  0-5 Units Subcutaneous QHS  . Ipratropium-Albuterol  1 puff Inhalation QID  . lurasidone  20 mg Oral Daily  . methylPREDNISolone (SOLU-MEDROL) injection  40 mg Intravenous Q8H  . mometasone-formoterol  2 puff Inhalation BID  . Ensure Max Protein  11 oz Oral Daily  . sertraline  50 mg Oral Daily  . sodium chloride flush  10 mL Intravenous Q12H  . sucralfate  1 g Oral BID AC  . traZODone  200 mg Oral QHS   Continuous Infusions: . remdesivir 100 mg in NS 100 mL Stopped (03/24/21 0942)  LOS: 2 days   Time spent: over 30 min  Azucena Fallen, DO [facility]  To contact the attending provider between 7A-7P or the covering provider during after hours 7P-7A, please log into the web site [email or web address] and access using universal [place] password for that web site. If you do not have the password, please call the hospital operator.  03/25/2021, 7:29 AM

## 2021-03-26 ENCOUNTER — Other Ambulatory Visit: Payer: Self-pay

## 2021-03-26 LAB — CBC
HCT: 34.9 % — ABNORMAL LOW (ref 36.0–46.0)
Hemoglobin: 10.5 g/dL — ABNORMAL LOW (ref 12.0–15.0)
MCH: 27.5 pg (ref 26.0–34.0)
MCHC: 30.1 g/dL (ref 30.0–36.0)
MCV: 91.4 fL (ref 80.0–100.0)
Platelets: 344 10*3/uL (ref 150–400)
RBC: 3.82 MIL/uL — ABNORMAL LOW (ref 3.87–5.11)
RDW: 15.5 % (ref 11.5–15.5)
WBC: 19.7 10*3/uL — ABNORMAL HIGH (ref 4.0–10.5)
nRBC: 0 % (ref 0.0–0.2)

## 2021-03-26 LAB — GLUCOSE, CAPILLARY
Glucose-Capillary: 276 mg/dL — ABNORMAL HIGH (ref 70–99)
Glucose-Capillary: 152 mg/dL — ABNORMAL HIGH (ref 70–99)
Glucose-Capillary: 337 mg/dL — ABNORMAL HIGH (ref 70–99)
Glucose-Capillary: 412 mg/dL — ABNORMAL HIGH (ref 70–99)

## 2021-03-26 LAB — BASIC METABOLIC PANEL
Anion gap: 7 (ref 5–15)
BUN: 16 mg/dL (ref 6–20)
CO2: 27 mmol/L (ref 22–32)
Calcium: 8.9 mg/dL (ref 8.9–10.3)
Chloride: 103 mmol/L (ref 98–111)
Creatinine, Ser: 0.74 mg/dL (ref 0.44–1.00)
GFR, Estimated: >60 mL/min (ref >60)
Glucose, Bld: 342 mg/dL — ABNORMAL HIGH (ref 70–99)
Potassium: 4.8 mmol/L (ref 3.5–5.1)
Sodium: 137 mmol/L (ref 135–145)

## 2021-03-26 MED ORDER — INSULIN ASPART 100 UNIT/ML ~~LOC~~ SOLN
7.0000 [IU] | Freq: Once | SUBCUTANEOUS | Status: AC
  Administered 2021-03-26: 7 [IU] via SUBCUTANEOUS

## 2021-03-26 NOTE — Progress Notes (Signed)
PROGRESS NOTE    Felicia Bradley  PPI:[id number] DOB: 18-Jul-1991 DOA: 03/23/2021 PCP: [facility]   Brief Narrative:   Felicia Bradley is 30 years old female with past medical history of COPD, severe persistent asthma with chronic hypoxic respiratory failure on 4 L of oxygen, obstructive sleep apnea on CPAP, history of DVT PE, CKD, GERD, PTSD bipolar disorder, stimulant abuse presented to hospital with increasing shortness of breath.  Patient was recently admitted to the hospital between 03/18/2021 2 03/21/2021 with acute exacerbation secondary to coronavirus OC43.  Patient was discharged on 4 L O2 supplementation and steroid taper per pulmonology recommendation but was readmitted again with increasing symptoms. Now with COVID 19 infection.    Assessment & Plan:   Principal Problem:   Asthma exacerbation Active Problems:   OSA (obstructive sleep apnea)   Acute respiratory disease due to COVID-19 virus   Hypokalemia   Hypotension   History of DVT (deep vein thrombosis)   History of pulmonary embolus (PE)   PTSD (post-traumatic stress disorder)   Bipolar 1 disorder (HCC)   BMI 50.0-59.9, adult (HCC)  Acute Asthma/COPD Exacerbation  Chronic Hypoxic Respiratory failure  Sars CoV 2 Infection  COVID 19  Coronavirus OC43 Virus Infection Recurrent admission this time.  History of Covid positive on 2/21 now again + ve.  Currently on 2 L of oxygen.  Continue Solu-Medrol, inhalers with Combivent and albuterol Dulera.  Patient will be continued on Tamiflu for 5 days.  Patient is agreeable to Actemra but given minimal symptoms and inflammatory markers has not been given at this time.  COVID-19 Labs  Recent Labs    03/23/21 1937 03/23/21 2336 03/25/21 0226  DDIMER 0.49  --   --   FERRITIN  --   --  34  CRP  --  1.2* 2.3*    Lab Results  Component Value Date   SARSCOV2NAA POSITIVE (A) 03/23/2021   SARSCOV2NAA NEGATIVE 03/18/2021   SARSCOV2NAA NEGATIVE 07/08/2020   SARSCOV2NAA  POSITIVE (A) 01/18/2020   Hypotension Improved with IV fluids.  Diarrhea Thought to be secondary to Covid illness.  GI pathogen panel and C. difficile panel still pending.  As per nursing staff patient has not had any loose diarrhea and was unable to collect the sample  OSA Continue CPAP at nighttime  History of deep vein thrombosis pulmonary embolism. Negative D-dimer.  Anticoagulation was discontinued 09/06/2019.    T2DM, uncontrolled Hemoglobin A1c of 6.2.  Continue sliding scale insulin Accu-Cheks diabetic diet.  Latest POC glucose of 335 yesterday.  Will need to closely monitor.  PTSD. Continue home xanax, abilify, buspar, latuda, trazodone  Anemia of chronic disease. Latest hemoglobin of 11.3.  We will continue to monitor.  Morbid obesity  Would benefit from weight loss as outpatient.  DVT prophylaxis: Lovenox subcu  Code Status: Full   Family Communication: None  Disposition:   Status is: Inpatient due to need for closer monitoring, on supplemental oxygen getting IV remdesivir for Covid infection,  Dispo: The patient is from: Home              Anticipated d/c is to: Home likely in 1 to 2 days.  Will transfer out of the stepdown unit              Patient currently is not medically stable to d/c.   Difficult to place patient No  Consultants:  None  Procedures:  None  Antimicrobials:  Remdesivir 03/24/2021>  Subjective: Patient was seen and examined  at bedside.  Complains of mild cough no chest pain from broken.  No fever or chills.  Objective: Vitals:   03/26/21 0200 03/26/21 0300 03/26/21 0400 03/26/21 0500  BP: (!) 143/73 (!) 147/64 134/73 125/64  Pulse: 65 (!) 57 (!) 56 (!) 55  Resp: 15 14 14 14   Temp:  97.6 F (36.4 C)    TempSrc:  Oral    SpO2: 100% 100% 100% 100%  Weight:        Intake/Output Summary (Last 24 hours) at 03/26/2021 0745 Last data filed at 03/26/2021 0500 Gross per 24 hour  Intake 510 ml  Output 3750 ml  Net -3240 ml    Filed Weights   03/24/21 0600 03/25/21 0453  Weight: 134.4 kg (!) 137.7 kg    Physical examination: General: Morbidly obese built, not in obvious distress, on nasal cannula oxygen HENT:   No scleral pallor or icterus noted. Oral mucosa is moist.  Chest: Diminished breath sounds bilaterally, coarse breath sounds.  Mild rhonchi noted. CVS: S1 &S2 heard. No murmur.  Regular rate and rhythm. Abdomen: Soft, nontender, nondistended.  Bowel sounds are heard.   Extremities: No cyanosis, clubbing or edema.  Peripheral pulses are palpable. Psych: Alert, awake and oriented, normal mood CNS:  No cranial nerve deficits.  Power equal in all extremities.   Skin: Warm and dry.  No rashes noted.  Data Reviewed: I have personally reviewed the following labs and imaging studies  CBC: Recent Labs  Lab 03/20/21 0356 03/21/21 0416 03/23/21 1725 03/24/21 0240 03/25/21 0226 03/26/21 0310  WBC 14.1* 13.1* 12.2* 11.2* 11.4* 19.7*  NEUTROABS 12.2* 10.9* 6.5  --  9.7*  --   HGB 10.4* 10.3* 11.3* 9.4* 10.7* 10.5*  HCT 34.9* 35.8* 37.5 32.4* 36.6 34.9*  MCV 93.3 94.7 92.1 92.6 92.4 91.4  PLT 433* 406* 395 330 344 344    Basic Metabolic Panel: Recent Labs  Lab 03/20/21 0356 03/21/21 0416 03/23/21 1725 03/24/21 0240 03/25/21 0226 03/26/21 0310  NA 139 142 142 140 138 137  K 4.8 4.5 3.2* 3.6 4.8 4.8  CL 109 110 103 103 103 103  CO2 23 25 30 28 26 27   GLUCOSE 244* 247* 170* 114* 231* 342*  BUN 10 15 21* 15 14 16   CREATININE 0.64 0.69 0.80 0.70 0.66 0.74  CALCIUM 8.8* 9.1 8.5* 8.0* 8.4* 8.9  MG 2.2 2.4  --   --  2.5*  --   PHOS 2.0* 2.3*  --   --  3.2  --     GFR: Estimated Creatinine Clearance: 140.4 mL/min (by C-G formula based on SCr of 0.74 mg/dL).  Liver Function Tests: Recent Labs  Lab 03/20/21 0356 03/21/21 0416 03/23/21 1725 03/25/21 0226  AST 35 17 24 19   ALT 29 26 51* 46*  ALKPHOS 58 51 70 56  BILITOT 0.4 0.4 0.3 0.4  PROT 7.0 6.9 6.6 6.4*  ALBUMIN 3.3* 3.3* 3.2*  3.1*    CBG: Recent Labs  Lab 03/24/21 2156 03/25/21 0757 03/25/21 1140 03/25/21 1624 03/25/21 2135  GLUCAP 323* 203* 386* 309* 335*     Recent Results (from the past 240 hour(s))  Resp Panel by RT-PCR (Flu A&B, Covid) Nasopharyngeal Swab     Status: None   Collection Time: 03/18/21  8:20 PM   Specimen: Nasopharyngeal Swab; Nasopharyngeal(NP) swabs in vial transport medium  Result Value Ref Range Status   SARS Coronavirus 2 by RT PCR NEGATIVE NEGATIVE Final    Comment: (NOTE) SARS-CoV-2 target nucleic  acids are NOT DETECTED.  The SARS-CoV-2 RNA is generally detectable in upper respiratory specimens during the acute phase of infection. The lowest concentration of SARS-CoV-2 viral copies this assay can detect is 138 copies/mL. A negative result does not preclude SARS-Cov-2 infection and should not be used as the sole basis for treatment or other patient management decisions. A negative result may occur with  improper specimen collection/handling, submission of specimen other than nasopharyngeal swab, presence of viral mutation(s) within the areas targeted by this assay, and inadequate number of viral copies(<138 copies/mL). A negative result must be combined with clinical observations, patient history, and epidemiological information. The expected result is Negative.  Fact Sheet for Patients:  BloggerCourse.com  Fact Sheet for Healthcare Providers:  SeriousBroker.it  This test is no t yet approved or cleared by the [place] FDA and  has been authorized for detection and/or diagnosis of SARS-CoV-2 by FDA under an Emergency Use Authorization (EUA). This EUA will remain  in effect (meaning this test can be used) for the duration of the COVID-19 declaration under Section 564(b)(1) of the Act, 21 U.S.C.section 360bbb-3(b)(1), unless the authorization is terminated  or revoked sooner.       Influenza A by PCR NEGATIVE  NEGATIVE Final   Influenza B by PCR NEGATIVE NEGATIVE Final    Comment: (NOTE) The Xpert Xpress SARS-CoV-2/FLU/RSV plus assay is intended as an aid in the diagnosis of influenza from Nasopharyngeal swab specimens and should not be used as a sole basis for treatment. Nasal washings and aspirates are unacceptable for Xpert Xpress SARS-CoV-2/FLU/RSV testing.  Fact Sheet for Patients: BloggerCourse.com  Fact Sheet for Healthcare Providers: SeriousBroker.it  This test is not yet approved or cleared by the [place] FDA and has been authorized for detection and/or diagnosis of SARS-CoV-2 by FDA under an Emergency Use Authorization (EUA). This EUA will remain in effect (meaning this test can be used) for the duration of the COVID-19 declaration under Section 564(b)(1) of the Act, 21 U.S.C. section 360bbb-3(b)(1), unless the authorization is terminated or revoked.  Performed at [facility], [address] [place], [place] [address]   Blood Culture (routine x 2)     Status: None   Collection Time: 03/18/21  8:20 PM   Specimen: BLOOD  Result Value Ref Range Status   Specimen Description   Final    BLOOD RIGHT ANTECUBITAL Performed at [facility], [address] [place], [place] [address]    Special Requests   Final    BOTTLES DRAWN AEROBIC AND ANAEROBIC Blood Culture adequate volume Performed at [facility], [address] [place], [place] [address]    Culture   Final    NO GROWTH 5 DAYS Performed at [facility], [address], [place], [place] [address]    Report Status 03/24/2021 FINAL  Final  Group A Strep by PCR     Status: None   Collection Time: 03/18/21  8:20 PM   Specimen: Nasopharyngeal Swab; Sterile Swab  Result Value Ref Range Status   Group A Strep by PCR NOT DETECTED NOT DETECTED Final    Comment: Performed at [facility], [address] [place],  [place] [address]  Blood Culture (routine x 2)     Status: None   Collection Time: 03/18/21  8:24 PM   Specimen: BLOOD  Result Value Ref Range Status   Specimen Description   Final    BLOOD LEFT  ANTECUBITAL Performed at [facility], [address] [place], [place] [address]    Special Requests   Final    BOTTLES DRAWN AEROBIC AND ANAEROBIC Blood Culture adequate volume Performed at [facility], [address] [place], [place] [address]    Culture   Final    NO GROWTH 5 DAYS Performed at [facility], [address], [place], [place] [address]    Report Status 03/24/2021 FINAL  Final  Urine culture     Status: Abnormal   Collection Time: 03/19/21  7:22 AM   Specimen: In/Out Cath Urine  Result Value Ref Range Status   Specimen Description   Final    IN/OUT CATH URINE Performed at [facility], [address] [place], [place] [address]    Special Requests   Final    NONE Performed at [facility], [address], [place], [place] [address]    Culture MULTIPLE SPECIES PRESENT, SUGGEST RECOLLECTION (A)  Final   Report Status 03/20/2021 FINAL  Final  Respiratory (~20 pathogens) panel by PCR     Status: Abnormal   Collection Time: 03/19/21 10:12 AM   Specimen: Nasopharyngeal Swab; Respiratory  Result Value Ref Range Status   Adenovirus NOT DETECTED NOT DETECTED Final   Coronavirus 229E NOT DETECTED NOT DETECTED Final    Comment: (NOTE) The Coronavirus on the Respiratory Panel, DOES NOT test for the novel  Coronavirus (2019 nCoV)    Coronavirus HKU1 NOT DETECTED NOT DETECTED Final   Coronavirus NL63 NOT DETECTED NOT DETECTED Final   Coronavirus OC43 DETECTED (A) NOT DETECTED Final   Metapneumovirus NOT DETECTED NOT DETECTED Final   Rhinovirus / Enterovirus NOT DETECTED NOT DETECTED Final   Influenza A NOT DETECTED NOT DETECTED Final   Influenza B NOT DETECTED NOT DETECTED Final   Parainfluenza Virus 1 NOT DETECTED  NOT DETECTED Final   Parainfluenza Virus 2 NOT DETECTED NOT DETECTED Final   Parainfluenza Virus 3 NOT DETECTED NOT DETECTED Final   Parainfluenza Virus 4 NOT DETECTED NOT DETECTED Final   Respiratory Syncytial Virus NOT DETECTED NOT DETECTED Final   Bordetella pertussis NOT DETECTED NOT DETECTED Final   Bordetella Parapertussis NOT DETECTED NOT DETECTED Final   Chlamydophila pneumoniae NOT DETECTED NOT DETECTED Final   Mycoplasma pneumoniae NOT DETECTED NOT DETECTED Final    Comment: Performed at [facility], [address] [place], [place] [address]  Culture, blood (routine x 2)     Status: None (Preliminary result)   Collection Time: 03/23/21  5:25 PM   Specimen: BLOOD  Result Value Ref Range Status   Specimen Description   Final    BLOOD LEFT ANTECUBITAL Performed at [facility], [address] [place], [place] [address]    Special Requests   Final    BOTTLES DRAWN AEROBIC AND ANAEROBIC Blood Culture adequate volume Performed at [facility], [address] [place], [place] [address]    Culture   Final    NO GROWTH 2 DAYS Performed at [facility], [address], [place], [place] [address]    Report Status PENDING  Incomplete  Resp Panel by RT-PCR (Flu A&B, Covid) Nasopharyngeal Swab     Status: Abnormal   Collection Time: 03/23/21  5:25 PM   Specimen: Nasopharyngeal Swab; Nasopharyngeal(NP) swabs in vial transport medium  Result Value Ref Range Status   SARS Coronavirus 2 by RT PCR POSITIVE (A)  NEGATIVE Final    Comment: RESULT CALLED TO, READ BACK BY AND VERIFIED WITH: Bertram MillardHARRIS, KIRA, RN @ [phone] 03/23/21 BY SJT (NOTE) SARS-CoV-2 target nucleic acids are DETECTED.  The SARS-CoV-2 RNA is generally detectable in upper respiratory specimens during the acute phase of infection. Positive results are indicative of the presence of the identified virus, but do not rule out bacterial infection or co-infection with other pathogens  not detected by the test. Clinical correlation with patient history and other diagnostic information is necessary to determine patient infection status. The expected result is Negative.  Fact Sheet for Patients: BloggerCourse.comhttps://www.fda.gov/media/152166/download  Fact Sheet for Healthcare Providers: SeriousBroker.ithttps://www.fda.gov/media/152162/download  This test is not yet approved or cleared by the [place] FDA and  has been authorized for detection and/or diagnosis of SARS-CoV-2 by FDA under an Emergency Use Authorization (EUA).  This EUA will remain in effect (meaning this test c an be used) for the duration of  the COVID-19 declaration under Section 564(b)(1) of the Act, 21 U.S.C. section 360bbb-3(b)(1), unless the authorization is terminated or revoked sooner.     Influenza A by PCR NEGATIVE NEGATIVE Final   Influenza B by PCR NEGATIVE NEGATIVE Final    Comment: (NOTE) The Xpert Xpress SARS-CoV-2/FLU/RSV plus assay is intended as an aid in the diagnosis of influenza from Nasopharyngeal swab specimens and should not be used as a sole basis for treatment. Nasal washings and aspirates are unacceptable for Xpert Xpress SARS-CoV-2/FLU/RSV testing.  Fact Sheet for Patients: BloggerCourse.comhttps://www.fda.gov/media/152166/download  Fact Sheet for Healthcare Providers: SeriousBroker.ithttps://www.fda.gov/media/152162/download  This test is not yet approved or cleared by the [place] FDA and has been authorized for detection and/or diagnosis of SARS-CoV-2 by FDA under an Emergency Use Authorization (EUA). This EUA will remain in effect (meaning this test can be used) for the duration of the COVID-19 declaration under Section 564(b)(1) of the Act, 21 U.S.C. section 360bbb-3(b)(1), unless the authorization is terminated or revoked.  Performed at [facility], [address] [place], [place] [address]   Respiratory (~20 pathogens) panel by PCR     Status: Abnormal   Collection Time: 03/23/21  6:56  PM  Result Value Ref Range Status   Adenovirus NOT DETECTED NOT DETECTED Final   Coronavirus 229E NOT DETECTED NOT DETECTED Final    Comment: (NOTE) The Coronavirus on the Respiratory Panel, DOES NOT test for the novel  Coronavirus (2019 nCoV)    Coronavirus HKU1 NOT DETECTED NOT DETECTED Final   Coronavirus NL63 NOT DETECTED NOT DETECTED Final   Coronavirus OC43 DETECTED (A) NOT DETECTED Final   Metapneumovirus NOT DETECTED NOT DETECTED Final   Rhinovirus / Enterovirus NOT DETECTED NOT DETECTED Final   Influenza A NOT DETECTED NOT DETECTED Final   Influenza B NOT DETECTED NOT DETECTED Final   Parainfluenza Virus 1 NOT DETECTED NOT DETECTED Final   Parainfluenza Virus 2 NOT DETECTED NOT DETECTED Final   Parainfluenza Virus 3 NOT DETECTED NOT DETECTED Final   Parainfluenza Virus 4 NOT DETECTED NOT DETECTED Final   Respiratory Syncytial Virus NOT DETECTED NOT DETECTED Final   Bordetella pertussis NOT DETECTED NOT DETECTED Final   Bordetella Parapertussis NOT DETECTED NOT DETECTED Final   Chlamydophila pneumoniae NOT DETECTED NOT DETECTED Final   Mycoplasma pneumoniae NOT DETECTED NOT DETECTED Final    Comment: Performed at [facility], [address] [place], [place] [address]  Culture, blood (routine x 2)     Status: None (Preliminary result)   Collection Time: 03/23/21  7:37  PM   Specimen: BLOOD  Result Value Ref Range Status   Specimen Description   Final    BLOOD SITE NOT SPECIFIED Performed at [facility], [address] [place], [place] [address]    Special Requests   Final    BOTTLES DRAWN AEROBIC AND ANAEROBIC Blood Culture results may not be optimal due to an excessive volume of blood received in culture bottles Performed at [facility], [address] [place], [place] [address]    Culture   Final    NO GROWTH 2 DAYS Performed at [facility], [address], [place], [place] [address]    Report Status PENDING  Incomplete   MRSA PCR Screening     Status: None   Collection Time: 03/24/21 12:58 AM   Specimen: Nasal Mucosa; Nasopharyngeal  Result Value Ref Range Status   MRSA by PCR NEGATIVE NEGATIVE Final    Comment:        The GeneXpert MRSA Assay (FDA approved for NASAL specimens only), is one component of a comprehensive MRSA colonization surveillance program. It is not intended to diagnose MRSA infection nor to guide or monitor treatment for MRSA infections. Performed at [facility], [address], [place], [place] [address]       Radiology Studies: No results found.      Scheduled Meds: . (feeding supplement) PROSource Plus  30 mL Oral BID BM  . ALPRAZolam  0.5 mg Oral BID  . ARIPiprazole  2 mg Oral Daily  . aspirin EC  81 mg Oral Daily  . brexpiprazole  2 mg Oral Daily  . busPIRone  7.5 mg Oral BID  . Chlorhexidine Gluconate Cloth  6 each Topical Daily  . enoxaparin (LOVENOX) injection  60 mg Subcutaneous Q24H  . ferrous sulfate  325 mg Oral BID WC  . folic acid  1 mg Oral Daily  . gabapentin  300 mg Oral TID  . insulin aspart  0-15 Units Subcutaneous TID WC  . insulin aspart  0-5 Units Subcutaneous QHS  . Ipratropium-Albuterol  1 puff Inhalation QID  . lurasidone  20 mg Oral Daily  . methylPREDNISolone (SOLU-MEDROL) injection  40 mg Intravenous Q8H  . mometasone-formoterol  2 puff Inhalation BID  . Ensure Max Protein  11 oz Oral Daily  . sertraline  50 mg Oral Daily  . sodium chloride flush  10 mL Intravenous Q12H  . sucralfate  1 g Oral BID AC  . traZODone  200 mg Oral QHS   Continuous Infusions: . remdesivir 100 mg in NS 100 mL Stopped (03/25/21 1053)    LOS: 3 days   Joycelyn Das, MD [facility] 03/26/2021, 7:45 AM

## 2021-03-26 NOTE — Progress Notes (Signed)
Inpatient Diabetes Program Recommendations  AACE/ADA: New Consensus Statement on Inpatient Glycemic Control (2015)  Target Ranges:  Prepandial:   less than 140 mg/dL      Peak postprandial:   less than 180 mg/dL (1-2 hours)      Critically ill patients:  140 - 180 mg/dL   Lab Results  Component Value Date   GLUCAP 337 (H) 03/26/2021   HGBA1C 6.2 (H) 03/21/2021    Review of Glycemic Control  Diabetes history: DM2 Outpatient Diabetes medications: None Current orders for Inpatient glycemic control: Novolog 0-15 units TID with meals and 0-5 HS  HgbA1C - 6.2% On Solumedrol 40 mg Q8H CBGs over goal of 140-180 mg/dL 26 units Novolog correction thus far today   Inpatient Diabetes Program Recommendations:     Add Lantus 12 units QD Add Novolog 5 units TID with meals while on steroids.  Continue to follow gluocose trends.  Thank you. Ailene Ards, RD, LDN, CDE Inpatient Diabetes Coordinator [phone]

## 2021-03-26 NOTE — Progress Notes (Signed)
Pt had a CBG of 412. On call MD B. Kyere notified. See new orders

## 2021-03-27 LAB — BASIC METABOLIC PANEL
Anion gap: 8 (ref 5–15)
BUN: 17 mg/dL (ref 6–20)
CO2: 33 mmol/L — ABNORMAL HIGH (ref 22–32)
Calcium: 8.9 mg/dL (ref 8.9–10.3)
Chloride: 97 mmol/L — ABNORMAL LOW (ref 98–111)
Creatinine, Ser: 0.83 mg/dL (ref 0.44–1.00)
GFR, Estimated: >60 mL/min (ref >60)
Glucose, Bld: 251 mg/dL — ABNORMAL HIGH (ref 70–99)
Potassium: 4.8 mmol/L (ref 3.5–5.1)
Sodium: 138 mmol/L (ref 135–145)

## 2021-03-27 LAB — GLUCOSE, CAPILLARY
Glucose-Capillary: 231 mg/dL — ABNORMAL HIGH (ref 70–99)
Glucose-Capillary: 210 mg/dL — ABNORMAL HIGH (ref 70–99)

## 2021-03-27 LAB — CBC
HCT: 38.6 % (ref 36.0–46.0)
Hemoglobin: 11.4 g/dL — ABNORMAL LOW (ref 12.0–15.0)
MCH: 27.3 pg (ref 26.0–34.0)
MCHC: 29.5 g/dL — ABNORMAL LOW (ref 30.0–36.0)
MCV: 92.3 fL (ref 80.0–100.0)
Platelets: 345 10*3/uL (ref 150–400)
RBC: 4.18 MIL/uL (ref 3.87–5.11)
RDW: 15.9 % — ABNORMAL HIGH (ref 11.5–15.5)
WBC: 16.6 10*3/uL — ABNORMAL HIGH (ref 4.0–10.5)
nRBC: 0 % (ref 0.0–0.2)

## 2021-03-27 MED ORDER — GUAIFENESIN-DM 100-10 MG/5ML PO SYRP: 5.0000 mL | ORAL_SOLUTION | ORAL | 0 refills | Status: DC | PRN

## 2021-03-27 MED ORDER — BUDESONIDE-FORMOTEROL FUMARATE 160-4.5 MCG/ACT IN AERO: 2.0000 | INHALATION_SPRAY | Freq: Two times a day (BID) | RESPIRATORY_TRACT | 12 refills | Status: DC

## 2021-03-27 MED ORDER — INSULIN GLARGINE 100 UNIT/ML ~~LOC~~ SOLN
12.0000 [IU] | Freq: Every day | SUBCUTANEOUS | Status: DC
  Administered 2021-03-27: 12 [IU] via SUBCUTANEOUS
  Filled 2021-03-27: qty 0.12

## 2021-03-27 MED ORDER — PREDNISONE 10 MG PO TABS: ORAL_TABLET | ORAL | 0 refills | Status: AC

## 2021-03-27 MED ORDER — INSULIN ASPART 100 UNIT/ML ~~LOC~~ SOLN
5.0000 [IU] | Freq: Three times a day (TID) | SUBCUTANEOUS | Status: DC
  Administered 2021-03-27: 5 [IU] via SUBCUTANEOUS

## 2021-03-27 NOTE — Discharge Instructions (Signed)
Viral Gastroenteritis, Adult  Viral gastroenteritis is also known as the stomach flu. This condition may affect your stomach, small intestine, and large intestine. It can cause sudden watery diarrhea, fever, and vomiting. This condition is caused by many different viruses. These viruses can be passed from person to person very easily (are contagious). Diarrhea and vomiting can make you feel weak and cause you to become dehydrated. You may not be able to keep fluids down. Dehydration can make you tired and thirsty, cause you to have a dry mouth, and decrease how often you urinate. It is important to replace the fluids that you lose from diarrhea and vomiting. What are the causes? Gastroenteritis is caused by many viruses, including rotavirus and norovirus. Norovirus is the most common cause in adults. You can get sick after being exposed to the viruses from other people. You can also get sick by:  Eating food, drinking water, or touching a surface contaminated with one of these viruses.  Sharing utensils or other personal items with an infected person. What increases the risk? You are more likely to develop this condition if you:  Have a weak body defense system (immune system).  Live with one or more children who are younger than 2 years old.  Live in a nursing home.  Travel on cruise ships. What are the signs or symptoms? Symptoms of this condition start suddenly 1-3 days after exposure to a virus. Symptoms may last for a few days or for as long as a week. Common symptoms include watery diarrhea and vomiting. Other symptoms include:  Fever.  Headache.  Fatigue.  Pain in the abdomen.  Chills.  Weakness.  Nausea.  Muscle aches.  Loss of appetite. How is this diagnosed? This condition is diagnosed with a medical history and physical exam. You may also have a stool test to check for viruses or other infections. How is this treated? This condition typically goes away on its  own. The focus of treatment is to prevent dehydration and restore lost fluids (rehydration). This condition may be treated with:  An oral rehydration solution (ORS) to replace important salts and minerals (electrolytes) in your body. Take this if told by your health care provider. This is a drink that is sold at pharmacies and retail stores.  Medicines to help with your symptoms.  Probiotic supplements to reduce symptoms of diarrhea.  Fluids given through an IV, if dehydration is severe. Older adults and people with other diseases or a weak immune system are at higher risk for dehydration. Follow these instructions at home: Eating and drinking  Take an ORS as told by your health care provider.  Drink clear fluids in small amounts as you are able. Clear fluids include: ? Water. ? Ice chips. ? Diluted fruit juice. ? Low-calorie sports drinks.  Drink enough fluid to keep your urine pale yellow.  Eat small amounts of healthy foods every 3-4 hours as you are able. This may include whole grains, fruits, vegetables, lean meats, and yogurt.  Avoid fluids that contain a lot of sugar or caffeine, such as energy drinks, sports drinks, and soda.  Avoid spicy or fatty foods.  Avoid alcohol.   General instructions  Wash your hands often, especially after having diarrhea or vomiting. If soap and water are not available, use hand sanitizer.  Make sure that all people in your household wash their hands well and often.  Take over-the-counter and prescription medicines only as told by your health care provider.  Rest at   home while you recover.  Watch your condition for any changes.  Take a warm bath to relieve any burning or pain from frequent diarrhea episodes.  Keep all follow-up visits as told by your health care provider. This is important.   Contact a health care provider if you:  Cannot keep fluids down.  Have symptoms that get worse.  Have new symptoms.  Feel light-headed or  dizzy.  Have muscle cramps. Get help right away if you:  Have chest pain.  Feel extremely weak or you faint.  See blood in your vomit.  Have vomit that looks like coffee grounds.  Have bloody or black stools or stools that look like tar.  Have a severe headache, a stiff neck, or both.  Have a rash.  Have severe pain, cramping, or bloating in your abdomen.  Have trouble breathing or you are breathing very quickly.  Have a fast heartbeat.  Have skin that feels cold and clammy.  Feel confused.  Have pain when you urinate.  Have signs of dehydration, such as: ? Dark urine, very little urine, or no urine. ? Cracked lips. ? Dry mouth. ? Sunken eyes. ? Sleepiness. ? Weakness. Summary  Viral gastroenteritis is also known as the stomach flu. It can cause sudden watery diarrhea, fever, and vomiting.  This condition can be passed from person to person very easily (is contagious).  Take an ORS if told by your health care provider. This is a drink that is sold at pharmacies and retail stores.  Wash your hands often, especially after having diarrhea or vomiting. If soap and water are not available, use hand sanitizer. This information is not intended to replace advice given to you by your health care provider. Make sure you discuss any questions you have with your health care provider. Document Revised: 05/19/2019 Document Reviewed: 10/05/2018 Elsevier Patient Education  2021 [employer]    Person Under Monitoring Name: Felicia Bradley  Location: [address] [place] [address]   Infection Prevention Recommendations for Individuals Confirmed to have, or Being Evaluated for, 2019 Novel Coronavirus (COVID-19) Infection Who Receive Care at Home  Individuals who are confirmed to have, or are being evaluated for, COVID-19 should follow the prevention steps below until a healthcare provider or local or state health department says they can return to normal  activities.  Stay home except to get medical care You should restrict activities outside your home, except for getting medical care. Do not go to work, school, or public areas, and do not use public transportation or taxis.  Call ahead before visiting your doctor Before your medical appointment, call the healthcare provider and tell them that you have, or are being evaluated for, COVID-19 infection. This will help the healthcare provider's office take steps to keep other people from getting infected. Ask your healthcare provider to call the local or state health department.  Monitor your symptoms Seek prompt medical attention if your illness is worsening (e.g., difficulty breathing). Before going to your medical appointment, call the healthcare provider and tell them that you have, or are being evaluated for, COVID-19 infection. Ask your healthcare provider to call the local or state health department.  Wear a facemask You should wear a facemask that covers your nose and mouth when you are in the same room with other people and when you visit a healthcare provider. People who live with or visit you should also wear a facemask while they are in the same room  with you.  Separate yourself from other people in your home As much as possible, you should stay in a different room from other people in your home. Also, you should use a separate bathroom, if available.  Avoid sharing household items You should not share dishes, drinking glasses, cups, eating utensils, towels, bedding, or other items with other people in your home. After using these items, you should wash them thoroughly with soap and water.  Cover your coughs and sneezes Cover your mouth and nose with a tissue when you cough or sneeze, or you can cough or sneeze into your sleeve. Throw used tissues in a lined trash can, and immediately wash your hands with soap and water for at least 20 seconds or use an alcohol-based hand  rub.  Wash your [employer] your hands often and thoroughly with soap and water for at least 20 seconds. You can use an alcohol-based hand sanitizer if soap and water are not available and if your hands are not visibly dirty. Avoid touching your eyes, nose, and mouth with unwashed hands.   Prevention Steps for Caregivers and Household Members of Individuals Confirmed to have, or Being Evaluated for, COVID-19 Infection Being Cared for in the Home  If you live with, or provide care at home for, a person confirmed to have, or being evaluated for, COVID-19 infection please follow these guidelines to prevent infection:  Follow healthcare provider's instructions Make sure that you understand and can help the patient follow any healthcare provider instructions for all care.  Provide for the patient's basic needs You should help the patient with basic needs in the home and provide support for getting groceries, prescriptions, and other personal needs.  Monitor the patient's symptoms If they are getting sicker, call his or her medical provider and tell them that the patient has, or is being evaluated for, COVID-19 infection. This will help the healthcare provider's office take steps to keep other people from getting infected. Ask the healthcare provider to call the local or state health department.  Limit the number of people who have contact with the patient  If possible, have only one caregiver for the patient.  Other household members should stay in another home or place of residence. If this is not possible, they should stay  in another room, or be separated from the patient as much as possible. Use a separate bathroom, if available.  Restrict visitors who do not have an essential need to be in the home.  Keep older adults, very young children, and other sick people away from the patient Keep older adults, very young children, and those who have compromised immune systems or chronic  health conditions away from the patient. This includes people with chronic heart, lung, or kidney conditions, diabetes, and cancer.  Ensure good ventilation Make sure that shared spaces in the home have good air flow, such as from an air conditioner or an opened window, weather permitting.  Wash your hands often  Wash your hands often and thoroughly with soap and water for at least 20 seconds. You can use an alcohol based hand sanitizer if soap and water are not available and if your hands are not visibly dirty.  Avoid touching your eyes, nose, and mouth with unwashed hands.  Use disposable paper towels to dry your hands. If not available, use dedicated cloth towels and replace them when they become wet.  Wear a facemask and gloves  Wear a disposable facemask at all times in the room  and gloves when you touch or have contact with the patient's blood, body fluids, and/or secretions or excretions, such as sweat, saliva, sputum, nasal mucus, vomit, urine, or feces.  Ensure the mask fits over your nose and mouth tightly, and do not touch it during use.  Throw out disposable facemasks and gloves after using them. Do not reuse.  Wash your hands immediately after removing your facemask and gloves.  If your personal clothing becomes contaminated, carefully remove clothing and launder. Wash your hands after handling contaminated clothing.  Place all used disposable facemasks, gloves, and other waste in a lined container before disposing them with other household waste.  Remove gloves and wash your hands immediately after handling these items.  Do not share dishes, glasses, or other household items with the patient  Avoid sharing household items. You should not share dishes, drinking glasses, cups, eating utensils, towels, bedding, or other items with a patient who is confirmed to have, or being evaluated for, COVID-19 infection.  After the person uses these items, you should wash them  thoroughly with soap and water.  Wash laundry thoroughly  Immediately remove and wash clothes or bedding that have blood, body fluids, and/or secretions or excretions, such as sweat, saliva, sputum, nasal mucus, vomit, urine, or feces, on them.  Wear gloves when handling laundry from the patient.  Read and follow directions on labels of laundry or clothing items and detergent. In general, wash and dry with the warmest temperatures recommended on the label.  Clean all areas the individual has used often  Clean all touchable surfaces, such as counters, tabletops, doorknobs, bathroom fixtures, toilets, phones, keyboards, tablets, and bedside tables, every day. Also, clean any surfaces that may have blood, body fluids, and/or secretions or excretions on them.  Wear gloves when cleaning surfaces the patient has come in contact with.  Use a diluted bleach solution (e.g., dilute bleach with 1 part bleach and 10 parts water) or a household disinfectant with a label that says EPA-registered for coronaviruses. To make a bleach solution at home, add 1 tablespoon of bleach to 1 quart (4 cups) of water. For a larger supply, add  cup of bleach to 1 gallon (16 cups) of water.  Read labels of cleaning products and follow recommendations provided on product labels. Labels contain instructions for safe and effective use of the cleaning product including precautions you should take when applying the product, such as wearing gloves or eye protection and making sure you have good ventilation during use of the product.  Remove gloves and wash hands immediately after cleaning.  Monitor yourself for signs and symptoms of illness Caregivers and household members are considered close contacts, should monitor their health, and will be asked to limit movement outside of the home to the extent possible. Follow the monitoring steps for close contacts listed on the symptom monitoring form.   ? If you have additional  questions, contact your local health department or call the epidemiologist on call at [phone] (available 24/7). ? This guidance is subject to change. For the most up-to-date guidance from [facility], please refer to their website: [email or web address]

## 2021-03-27 NOTE — Discharge Summary (Signed)
Physician Discharge Summary  Felicia Bradley RXV:[id number] DOB: Aug 28, 1991 DOA: 03/23/2021  PCP: [facility]  Admit date: 03/23/2021 Discharge date: 03/27/2021  Admitted From: Home Disposition: Home  Recommendations for Outpatient Follow-up:  1. Follow up with PCP in 1-2 weeks 2. Continue present taper on discharge 3. Imodium as needed for diarrhea related to Covid viral gastroenteritis 4. Please obtain BMP/CBC in one week  Home Health: No Equipment/Devices: Oxygen, 4-6 L/min  Discharge Condition: Stable CODE STATUS: Full code Diet recommendation: Heart healthy/consistent carbohydrate diet  History of present illness:  Felicia Bradley is a 30 y.o. female with medical history significant for COPD/severe persistent asthma with chronic hypoxemia on 4 L on chronic steroid, OSA on CPAP, history of DVT/PE, CKD, GERD, PTSD/bipolar disorder, history of heroin abuse who presents with concerns of increasing shortness of breath, dizziness and diarrhea.  Patient was recently hospitalized from 4/5-4/8 for asthma exacerbation thought secondary to coronavirus OC43.  Patient was discharged on 4 L O2 supplementation and steroid taper per pulmonology recommendation.  However patient states that she never fully felt better at discharge.  She has had diarrhea even during that admission that has not resolved..  Feels her blood pressure has been waxing and waning.  Has been having fever.  Feels dizzy just taking a shower. She is extremely short of breath with chest pain worse with coughing. Reports she has been intubated 8 times in the past for her asthma.  Last time being in 2018.  States her asthma is very severe and she has exacerbations several times a week and can be triggered by just different smells.  She takes chronic prednisone and also receives steroid injections twice a month.  In the ED, patient was afebrile but hypotensive down to systolic of 80s over 50s and was not put on her baseline  oxygen supplementation at the time my evaluation.  She had leukocytosis of 12.2 but has been on steroids.  Hemoglobin of 11.3.  Lactate of 2.7 down to 2.5.  Hypokalemia of 2.5. SARS Covid PCR positive. Chest x-ray negative.  Hospital service consulted for further evaluation and management of hypertension, dyspnea related to asthma versus Covid-19 viral pneumonia.  Hospital course:  Acute on chronic hypoxic respiratory failure, POA Covid-19 viral infection Asthma/COPD exacerbation Viral gastroenteritis Patient presenting with progressive shortness of breath, diarrhea.  SARS-CoV-2 PCR positive. Chest x-ray unrevealing.  Given her worsening respiratory status, patient was started on remdesivir and completed 5-day course.  Additionally patient was started on IV steroids which were slowly tapered down and we will continue prednisone taper on discharge.  Patient's oxygen status back to her baseline 4-6 L.  We will continue Symbicort.  Imodium as needed for diarrhea.  Outpatient follow-up with PCP.  Hypovolemic hypotension: Resolved Etiology likely related to hypovolemic hypotension from diffuse diarrhea from underlying Covid-19 viral gastroenteritis.  Supported with IV fluid hydration.  Blood pressure has now normalized.  Imodium as needed.  Encourage increase oral intake.  Hypokalemia Repleted during hospitalization.  Recommend repeat BMP 1 week.  OSA: Continue nocturnal CPAP  Type 2 diabetes mellitus Hemoglobin A1c 6.2, diet controlled at home.  Outpatient follow-up with PCP.  PTSD/bipolar disorder Continue Xanax, Abilify, BuSpar, Latuda, trazodone  Morbid obesity Body mass index is 56.25 kg/m.  Discussed with patient needs for aggressive lifestyle changes/weight loss as this complicates all facets of care.  Outpatient follow-up with PCP.  May benefit from bariatric evaluation outpatient.   Discharge Diagnoses:  Principal Problem:   Asthma exacerbation Active  Problems:   OSA (obstructive  sleep apnea)   Acute respiratory disease due to COVID-19 virus   History of DVT (deep vein thrombosis)   History of pulmonary embolus (PE)   PTSD (post-traumatic stress disorder)   Bipolar 1 disorder (HCC)   BMI 50.0-59.9, adult [facility])    Discharge Instructions  Discharge Instructions    Call MD for:  difficulty breathing, headache or visual disturbances   Complete by: As directed    Call MD for:  extreme fatigue   Complete by: As directed    Call MD for:  persistant dizziness or light-headedness   Complete by: As directed    Call MD for:  persistant nausea and vomiting   Complete by: As directed    Call MD for:  severe uncontrolled pain   Complete by: As directed    Call MD for:  temperature >100.4   Complete by: As directed    Diet - low sodium heart healthy   Complete by: As directed    Increase activity slowly   Complete by: As directed      Allergies as of 03/27/2021      Reactions   Contrast Media [iodinated Diagnostic Agents] Anaphylaxis   Dilaudid [hydromorphone] Anaphylaxis   Tramadol Itching   Apple Hives   Banana Hives   Coconut Oil Hives, Itching   Dexamethasone Nausea And Vomiting, Hives   "feels like my pubic hair is on fire"   Other Hives   Zucchini and squash   Solu-medrol [methylprednisolone] Nausea And Vomiting   Toradol [ketorolac Tromethamine] Hives, Itching      Medication List    STOP taking these medications   guaiFENesin 600 MG 12 hr tablet Commonly known as: MUCINEX   predniSONE 10 MG (21) Tbpk tablet Commonly known as: STERAPRED UNI-PAK 21 TAB Replaced by: predniSONE 10 MG tablet     TAKE these medications   acetaminophen 500 MG tablet Commonly known as: TYLENOL Take 500 mg by mouth every 6 (six) hours as needed for mild pain.   albuterol 108 (90 Base) MCG/ACT inhaler Commonly known as: VENTOLIN HFA Inhale 1 puff into the lungs 2 (two) times daily as needed for wheezing or shortness of breath.   ALPRAZolam 0.5 MG  tablet Commonly known as: XANAX Take 0.5 mg by mouth 2 (two) times daily.   ARIPiprazole 2 MG tablet Commonly known as: ABILIFY Take 2 mg by mouth daily.   aspirin EC 81 MG tablet Take 81 mg by mouth daily. Swallow whole.   brexpiprazole 2 MG Tabs tablet Commonly known as: REXULTI Take 2 mg by mouth daily.   budesonide-formoterol 160-4.5 MCG/ACT inhaler Commonly known as: SYMBICORT Inhale 2 puffs into the lungs 2 (two) times daily. What changed: when to take this   busPIRone 7.5 MG tablet Commonly known as: BUSPAR Take 7.5 mg by mouth in the morning and at bedtime.   celecoxib 100 MG capsule Commonly known as: CELEBREX Take 100 mg by mouth 2 (two) times daily as needed for pain.   cyclobenzaprine 10 MG tablet Commonly known as: FLEXERIL Take 10 mg by mouth 3 (three) times daily as needed for muscle spasms.   diclofenac Sodium 1 % Gel Commonly known as: VOLTAREN Apply 2 g topically 4 (four) times daily. What changed:   when to take this  reasons to take this   EPINEPHrine 0.3 mg/0.3 mL Soaj injection Commonly known as: EPI-PEN Inject 0.3 mg into the muscle as needed for anaphylaxis.   ferrous sulfate 325 (  65 FE) MG EC tablet Take 325 mg by mouth 2 (two) times daily with a meal.   folic acid 1 MG tablet Commonly known as: FOLVITE Take 1 tablet (1 mg total) by mouth daily.   gabapentin 300 MG capsule Commonly known as: NEURONTIN Take 300 mg by mouth 3 (three) times daily.   guaiFENesin-dextromethorphan 100-10 MG/5ML syrup Commonly known as: ROBITUSSIN DM Take 5 mLs by mouth every 4 (four) hours as needed for cough.   ipratropium-albuterol 0.5-2.5 (3) MG/3ML Soln Commonly known as: DUONEB Take 3 mLs by nebulization in the morning, at noon, and at bedtime.   Latuda 20 MG Tabs tablet Generic drug: lurasidone Take 20 mg by mouth daily.   multivitamin with minerals Tabs tablet Take 1 tablet by mouth daily.   oxyCODONE-acetaminophen 10-325 MG  tablet Commonly known as: PERCOCET Take 1 tablet by mouth 3 (three) times daily as needed for severe pain.   predniSONE 10 MG tablet Commonly known as: DELTASONE Take 4 tablets (40 mg total) by mouth daily for 2 days, THEN 3 tablets (30 mg total) daily for 2 days, THEN 2 tablets (20 mg total) daily for 2 days, THEN 1 tablet (10 mg total) daily for 2 days. Start taking on: March 27, 2021 Replaces: predniSONE 10 MG (21) Tbpk tablet   senna-docusate 8.6-50 MG tablet Commonly known as: Senokot-S Take 1 tablet by mouth at bedtime.   sertraline 50 MG tablet Commonly known as: ZOLOFT Take 50 mg by mouth daily.   sucralfate 1 GM/10ML suspension Commonly known as: CARAFATE Take 1 g by mouth 2 (two) times daily before a meal.   traZODone 100 MG tablet Commonly known as: DESYREL Take 200 mg by mouth at bedtime.       Follow-up Information    [place], [facility]. Schedule an appointment as soon as possible for a visit in 1 week(s).   Specialty: Internal Medicine Contact information: [address] [place] [address] [phone]        Nahser, Deloris Ping, MD .   Specialty: Cardiology Contact information: [address] [place] [address] [phone]              Allergies  Allergen Reactions  . Contrast Media [Iodinated Diagnostic Agents] Anaphylaxis  . Dilaudid [Hydromorphone] Anaphylaxis  . Tramadol Itching  . Apple Hives  . Banana Hives  . Coconut Oil Hives and Itching  . Dexamethasone Nausea And Vomiting and Hives    "feels like my pubic hair is on fire"  . Other Hives    Zucchini and squash  . Solu-Medrol [Methylprednisolone] Nausea And Vomiting  . Toradol [Ketorolac Tromethamine] Hives and Itching    Consultations:  None   Procedures/Studies: DG Chest 2 View  Result Date: 03/07/2021 CLINICAL DATA:  Shortness of breath, chest pain EXAM: CHEST - 2 VIEW COMPARISON:  Radiograph 07/23/2020 FINDINGS: Patient body  habitus result in increased attenuation towards the lung bases. No convincing consolidative process is evident accounting for body habitus and atelectatic changes. Stable cardiomediastinal contours. No pneumothorax or visible effusion. No other acute osseous or soft tissue abnormality. Telemetry leads overlie the chest. IMPRESSION: Study limited by body habitus. Basilar atelectasis without other acute cardiopulmonary abnormality. Electronically Signed   By: Kreg Shropshire M.D.   On: 03/07/2021 01:56   DG Chest Port 1 View  Result Date: 03/23/2021 CLINICAL DATA:  30 year old with cough. EXAM: PORTABLE CHEST 1 VIEW COMPARISON:  03/21/2021 and earlier. FINDINGS: Markedly suboptimal inspiration  which accounts for bibasilar atelectasis and extension weights the cardiac silhouette. Taking this into account, cardiac silhouette likely upper normal in size to mildly enlarged. Lungs otherwise clear. Pulmonary vascularity normal. IMPRESSION: Markedly suboptimal inspiration accounts for bibasilar atelectasis. Electronically Signed   By: Hulan Saas M.D.   On: 03/23/2021 18:14   DG CHEST PORT 1 VIEW  Result Date: 03/21/2021 CLINICAL DATA:  Shortness of breath EXAM: PORTABLE CHEST 1 VIEW COMPARISON:  03/20/2021 FINDINGS: Low volume, lordotic, underpenetrated AP portable examination. There are possible small layering pleural effusions. No acute appearing airspace opacity. Cardiomegaly, likely exaggerated by technique. IMPRESSION: Low volume, lordotic, underpenetrated AP portable examination. There are possible small layering pleural effusions. No acute appearing airspace opacity. PA and lateral radiographs may be helpful to further evaluate. Electronically Signed   By: Lauralyn Primes M.D.   On: 03/21/2021 08:30   DG CHEST PORT 1 VIEW  Result Date: 03/20/2021 CLINICAL DATA:  Shortness of breath. EXAM: PORTABLE CHEST 1 VIEW COMPARISON:  03/18/2021. FINDINGS: Borderline cardiomegaly and pulmonary venous congestion. Low  lung volumes. Low lung volumes with mild bibasilar atelectasis. No prominent pleural effusion. No pneumothorax. No acute bony abnormality identified. IMPRESSION: 1.  Borderline cardiomegaly and pulmonary venous congestion. 2.  Low lung volumes with mild bibasilar atelectasis. Electronically Signed   By: Maisie Fus  Register   On: 03/20/2021 05:45   DG Chest Port 1 View  Result Date: 03/18/2021 CLINICAL DATA:  Asthma, shortness of breath, vomiting, and cough. EXAM: PORTABLE CHEST 1 VIEW COMPARISON:  03/07/2021 FINDINGS: Shallow inspiration. Atelectasis in the lung bases. Heart size and pulmonary vascularity are likely normal for technique. No apparent pleural effusion or pneumothorax. Mediastinal contours appear intact. Similar appearance to prior study. IMPRESSION: Shallow inspiration with atelectasis in the lung bases. Electronically Signed   By: Burman Nieves M.D.   On: 03/18/2021 21:33      Subjective: Patient seen and examined at bedside, resting comfortably at edge of bed.  Ready for discharge home.  No other complaints or concerns at this time.  Dyspnea has now resolved.  Oxygenating well on her home baseline oxygen requirements.  No other complaints or concerns at this time.  Denies headache, no fever/chills/night sweats, no nausea/vomiting/diarrhea, no chest pain, palpitations, no shortness of breath, no abdominal pain, no weakness, no fatigue, no paresthesias.  No acute events overnight per nursing staff.  Discharge Exam: Vitals:   03/27/21 0300 03/27/21 0501  BP:  112/69  Pulse:  (!) 56  Resp:  18  Temp: 97.7 F (36.5 C) 97.7 F (36.5 C)  SpO2:  95%   Vitals:   03/26/21 1916 03/26/21 2333 03/27/21 0300 03/27/21 0501  BP: 126/74 129/87  112/69  Pulse: 92 69  (!) 56  Resp: 20 20  18   Temp: (!) 97.5 F (36.4 C) 97.6 F (36.4 C) 97.7 F (36.5 C) 97.7 F (36.5 C)  TempSrc: Oral Oral Oral   SpO2: 100% 100%  95%  Weight:    (!) 144 kg  Height:        General: Pt is alert,  awake, not in acute distress, obese Cardiovascular: RRR, S1/S2 +, no rubs, no gallops Respiratory: CTA bilaterally, no wheezing, no rhonchi Abdominal: Soft, NT, ND, bowel sounds + Extremities: no edema, no cyanosis    The results of significant diagnostics from this hospitalization (including imaging, microbiology, ancillary and laboratory) are listed below for reference.     Microbiology: Recent Results (from the past 240 hour(s))  Resp Panel by RT-PCR (Flu  A&B, Covid) Nasopharyngeal Swab     Status: None   Collection Time: 03/18/21  8:20 PM   Specimen: Nasopharyngeal Swab; Nasopharyngeal(NP) swabs in vial transport medium  Result Value Ref Range Status   SARS Coronavirus 2 by RT PCR NEGATIVE NEGATIVE Final    Comment: (NOTE) SARS-CoV-2 target nucleic acids are NOT DETECTED.  The SARS-CoV-2 RNA is generally detectable in upper respiratory specimens during the acute phase of infection. The lowest concentration of SARS-CoV-2 viral copies this assay can detect is 138 copies/mL. A negative result does not preclude SARS-Cov-2 infection and should not be used as the sole basis for treatment or other patient management decisions. A negative result may occur with  improper specimen collection/handling, submission of specimen other than nasopharyngeal swab, presence of viral mutation(s) within the areas targeted by this assay, and inadequate number of viral copies(<138 copies/mL). A negative result must be combined with clinical observations, patient history, and epidemiological information. The expected result is Negative.  Fact Sheet for Patients:  BloggerCourse.com  Fact Sheet for Healthcare Providers:  SeriousBroker.it  This test is no t yet approved or cleared by the [place] FDA and  has been authorized for detection and/or diagnosis of SARS-CoV-2 by FDA under an Emergency Use Authorization (EUA). This EUA will remain  in  effect (meaning this test can be used) for the duration of the COVID-19 declaration under Section 564(b)(1) of the Act, 21 U.S.C.section 360bbb-3(b)(1), unless the authorization is terminated  or revoked sooner.       Influenza A by PCR NEGATIVE NEGATIVE Final   Influenza B by PCR NEGATIVE NEGATIVE Final    Comment: (NOTE) The Xpert Xpress SARS-CoV-2/FLU/RSV plus assay is intended as an aid in the diagnosis of influenza from Nasopharyngeal swab specimens and should not be used as a sole basis for treatment. Nasal washings and aspirates are unacceptable for Xpert Xpress SARS-CoV-2/FLU/RSV testing.  Fact Sheet for Patients: BloggerCourse.com  Fact Sheet for Healthcare Providers: SeriousBroker.it  This test is not yet approved or cleared by the [place] FDA and has been authorized for detection and/or diagnosis of SARS-CoV-2 by FDA under an Emergency Use Authorization (EUA). This EUA will remain in effect (meaning this test can be used) for the duration of the COVID-19 declaration under Section 564(b)(1) of the Act, 21 U.S.C. section 360bbb-3(b)(1), unless the authorization is terminated or revoked.  Performed at [facility], [address] [place], [place] [address]   Blood Culture (routine x 2)     Status: None   Collection Time: 03/18/21  8:20 PM   Specimen: BLOOD  Result Value Ref Range Status   Specimen Description   Final    BLOOD RIGHT ANTECUBITAL Performed at [facility], [address] [place], [place] [address]    Special Requests   Final    BOTTLES DRAWN AEROBIC AND ANAEROBIC Blood Culture adequate volume Performed at [facility], [address] [place], [place] [address]    Culture   Final    NO GROWTH 5 DAYS Performed at [facility], [address], [place], [place] [address]    Report Status 03/24/2021 FINAL  Final  Group A Strep by PCR     Status: None    Collection Time: 03/18/21  8:20 PM   Specimen: Nasopharyngeal Swab; Sterile Swab  Result Value Ref Range Status   Group A Strep by PCR NOT DETECTED NOT DETECTED Final    Comment: Performed at  [facility], [address] [place], [place] [address]  Blood Culture (routine x 2)     Status: None   Collection Time: 03/18/21  8:24 PM   Specimen: BLOOD  Result Value Ref Range Status   Specimen Description   Final    BLOOD LEFT ANTECUBITAL Performed at [facility], [address] [place], [place] [address]    Special Requests   Final    BOTTLES DRAWN AEROBIC AND ANAEROBIC Blood Culture adequate volume Performed at [facility], [address] [place], [place] [address]    Culture   Final    NO GROWTH 5 DAYS Performed at [facility], [address], [place], [place] [address]    Report Status 03/24/2021 FINAL  Final  Urine culture     Status: Abnormal   Collection Time: 03/19/21  7:22 AM   Specimen: In/Out Cath Urine  Result Value Ref Range Status   Specimen Description   Final    IN/OUT CATH URINE Performed at [facility], [address] [place], [place] [address]    Special Requests   Final    NONE Performed at [facility], [address], [place], [place] [address]    Culture MULTIPLE SPECIES PRESENT, SUGGEST RECOLLECTION (A)  Final   Report Status 03/20/2021 FINAL  Final  Respiratory (~20 pathogens) panel by PCR     Status: Abnormal   Collection Time: 03/19/21 10:12 AM   Specimen: Nasopharyngeal Swab; Respiratory  Result Value Ref Range Status   Adenovirus NOT DETECTED NOT DETECTED Final   Coronavirus 229E NOT DETECTED NOT DETECTED Final    Comment: (NOTE) The Coronavirus on the Respiratory Panel, DOES NOT test for the novel  Coronavirus (2019 nCoV)    Coronavirus HKU1 NOT DETECTED NOT DETECTED Final   Coronavirus NL63 NOT DETECTED NOT DETECTED Final   Coronavirus OC43 DETECTED (A) NOT  DETECTED Final   Metapneumovirus NOT DETECTED NOT DETECTED Final   Rhinovirus / Enterovirus NOT DETECTED NOT DETECTED Final   Influenza A NOT DETECTED NOT DETECTED Final   Influenza B NOT DETECTED NOT DETECTED Final   Parainfluenza Virus 1 NOT DETECTED NOT DETECTED Final   Parainfluenza Virus 2 NOT DETECTED NOT DETECTED Final   Parainfluenza Virus 3 NOT DETECTED NOT DETECTED Final   Parainfluenza Virus 4 NOT DETECTED NOT DETECTED Final   Respiratory Syncytial Virus NOT DETECTED NOT DETECTED Final   Bordetella pertussis NOT DETECTED NOT DETECTED Final   Bordetella Parapertussis NOT DETECTED NOT DETECTED Final   Chlamydophila pneumoniae NOT DETECTED NOT DETECTED Final   Mycoplasma pneumoniae NOT DETECTED NOT DETECTED Final    Comment: Performed at [facility], [address] [place], [place] [address]  Culture, blood (routine x 2)     Status: None (Preliminary result)   Collection Time: 03/23/21  5:25 PM   Specimen: BLOOD  Result Value Ref Range Status   Specimen Description   Final    BLOOD LEFT ANTECUBITAL Performed at [facility], [address] [place], [place] [address]    Special Requests   Final    BOTTLES DRAWN AEROBIC AND ANAEROBIC Blood Culture adequate volume Performed at [facility], [address], [place], [place] [address]    Culture   Final    NO GROWTH 4 DAYS Performed at [facility], [address], [place], [place] [address]    Report Status  PENDING  Incomplete  Resp Panel by RT-PCR (Flu A&B, Covid) Nasopharyngeal Swab     Status: Abnormal   Collection Time: 03/23/21  5:25 PM   Specimen: Nasopharyngeal Swab; Nasopharyngeal(NP) swabs in vial transport medium  Result Value Ref Range Status   SARS Coronavirus 2 by RT PCR POSITIVE (A) NEGATIVE Final    Comment: RESULT CALLED TO, READ BACK BY AND VERIFIED WITH: Bertram Millard, RN @ [phone] 03/23/21 BY SJT (NOTE) SARS-CoV-2 target nucleic acids are DETECTED.  The  SARS-CoV-2 RNA is generally detectable in upper respiratory specimens during the acute phase of infection. Positive results are indicative of the presence of the identified virus, but do not rule out bacterial infection or co-infection with other pathogens not detected by the test. Clinical correlation with patient history and other diagnostic information is necessary to determine patient infection status. The expected result is Negative.  Fact Sheet for Patients: BloggerCourse.com  Fact Sheet for Healthcare Providers: SeriousBroker.it  This test is not yet approved or cleared by the [place] FDA and  has been authorized for detection and/or diagnosis of SARS-CoV-2 by FDA under an Emergency Use Authorization (EUA).  This EUA will remain in effect (meaning this test c an be used) for the duration of  the COVID-19 declaration under Section 564(b)(1) of the Act, 21 U.S.C. section 360bbb-3(b)(1), unless the authorization is terminated or revoked sooner.     Influenza A by PCR NEGATIVE NEGATIVE Final   Influenza B by PCR NEGATIVE NEGATIVE Final    Comment: (NOTE) The Xpert Xpress SARS-CoV-2/FLU/RSV plus assay is intended as an aid in the diagnosis of influenza from Nasopharyngeal swab specimens and should not be used as a sole basis for treatment. Nasal washings and aspirates are unacceptable for Xpert Xpress SARS-CoV-2/FLU/RSV testing.  Fact Sheet for Patients: BloggerCourse.com  Fact Sheet for Healthcare Providers: SeriousBroker.it  This test is not yet approved or cleared by the [place] FDA and has been authorized for detection and/or diagnosis of SARS-CoV-2 by FDA under an Emergency Use Authorization (EUA). This EUA will remain in effect (meaning this test can be used) for the duration of the COVID-19 declaration under Section 564(b)(1) of the Act, 21 U.S.C. section  360bbb-3(b)(1), unless the authorization is terminated or revoked.  Performed at [facility], [address] [place], [place] [address]   Respiratory (~20 pathogens) panel by PCR     Status: Abnormal   Collection Time: 03/23/21  6:56 PM  Result Value Ref Range Status   Adenovirus NOT DETECTED NOT DETECTED Final   Coronavirus 229E NOT DETECTED NOT DETECTED Final    Comment: (NOTE) The Coronavirus on the Respiratory Panel, DOES NOT test for the novel  Coronavirus (2019 nCoV)    Coronavirus HKU1 NOT DETECTED NOT DETECTED Final   Coronavirus NL63 NOT DETECTED NOT DETECTED Final   Coronavirus OC43 DETECTED (A) NOT DETECTED Final   Metapneumovirus NOT DETECTED NOT DETECTED Final   Rhinovirus / Enterovirus NOT DETECTED NOT DETECTED Final   Influenza A NOT DETECTED NOT DETECTED Final   Influenza B NOT DETECTED NOT DETECTED Final   Parainfluenza Virus 1 NOT DETECTED NOT DETECTED Final   Parainfluenza Virus 2 NOT DETECTED NOT DETECTED Final   Parainfluenza Virus 3 NOT DETECTED NOT DETECTED Final   Parainfluenza Virus 4 NOT DETECTED NOT DETECTED Final   Respiratory Syncytial Virus NOT DETECTED NOT DETECTED Final   Bordetella pertussis NOT DETECTED NOT DETECTED Final   Bordetella Parapertussis NOT DETECTED NOT DETECTED Final  Chlamydophila pneumoniae NOT DETECTED NOT DETECTED Final   Mycoplasma pneumoniae NOT DETECTED NOT DETECTED Final    Comment: Performed at [facility], [address] [place], [place] [address]  Culture, blood (routine x 2)     Status: None (Preliminary result)   Collection Time: 03/23/21  7:37 PM   Specimen: BLOOD  Result Value Ref Range Status   Specimen Description   Final    BLOOD SITE NOT SPECIFIED Performed at [facility], [address] [place], [place] [address]    Special Requests   Final    BOTTLES DRAWN AEROBIC AND ANAEROBIC Blood Culture results may not be optimal due to an excessive volume of blood received in  culture bottles Performed at [facility], [address] [place], [place] [address]    Culture   Final    NO GROWTH 4 DAYS Performed at [facility], [address] [place], [place] [address]    Report Status PENDING  Incomplete  MRSA PCR Screening     Status: None   Collection Time: 03/24/21 12:58 AM   Specimen: Nasal Mucosa; Nasopharyngeal  Result Value Ref Range Status   MRSA by PCR NEGATIVE NEGATIVE Final    Comment:        The GeneXpert MRSA Assay (FDA approved for NASAL specimens only), is one component of a comprehensive MRSA colonization surveillance program. It is not intended to diagnose MRSA infection nor to guide or monitor treatment for MRSA infections. Performed at [facility], [address] [place], [place] [address]      Labs: BNP (last 3 results) Recent Labs    03/24/21 0240  BNP 22.7   Basic Metabolic Panel: Recent Labs  Lab 03/21/21 0416 03/23/21 1725 03/24/21 0240 03/25/21 0226 03/26/21 0310 03/27/21 0853  NA 142 142 140 138 137 138  K 4.5 3.2* 3.6 4.8 4.8 4.8  CL 110 103 103 103 103 97*  CO2 25 30 28 26 27  33*  GLUCOSE 247* 170* 114* 231* 342* 251*  BUN 15 21* 15 14 16 17   CREATININE 0.69 0.80 0.70 0.66 0.74 0.83  CALCIUM 9.1 8.5* 8.0* 8.4* 8.9 8.9  MG 2.4  --   --  2.5*  --   --   PHOS 2.3*  --   --  3.2  --   --    Liver Function Tests: Recent Labs  Lab 03/21/21 0416 03/23/21 1725 03/25/21 0226  AST 17 24 19   ALT 26 51* 46*  ALKPHOS 51 70 56  BILITOT 0.4 0.3 0.4  PROT 6.9 6.6 6.4*  ALBUMIN 3.3* 3.2* 3.1*   No results for input(s): LIPASE, AMYLASE in the last 168 hours. No results for input(s): AMMONIA in the last 168 hours. CBC: Recent Labs  Lab 03/21/21 0416 03/23/21 1725 03/24/21 0240 03/25/21 0226 03/26/21 0310 03/27/21 0853  WBC 13.1* 12.2* 11.2* 11.4* 19.7* 16.6*  NEUTROABS 10.9* 6.5  --  9.7*  --   --   HGB 10.3* 11.3* 9.4* 10.7* 10.5* 11.4*  HCT 35.8* 37.5  32.4* 36.6 34.9* 38.6  MCV 94.7 92.1 92.6 92.4 91.4 92.3  PLT 406* 395 330 344 344 345   Cardiac Enzymes: No results for input(s): CKTOTAL, CKMB, CKMBINDEX, TROPONINI in the last 168 hours. BNP: Invalid input(s): POCBNP CBG: Recent Labs  Lab 03/26/21 0812 03/26/21 1110 03/26/21 1635 03/26/21 2129 03/27/21 0714  GLUCAP 276* 152* 337* 412* 231*   D-Dimer No results for input(s):  DDIMER in the last 72 hours. Hgb A1c No results for input(s): HGBA1C in the last 72 hours. Lipid Profile No results for input(s): CHOL, HDL, LDLCALC, TRIG, CHOLHDL, LDLDIRECT in the last 72 hours. Thyroid function studies No results for input(s): TSH, T4TOTAL, T3FREE, THYROIDAB in the last 72 hours.  Invalid input(s): FREET3 Anemia work up Recent Labs    03/25/21 0226  VITAMINB12 263  FOLATE 7.2  FERRITIN 34  TIBC 333  IRON 40   Urinalysis    Component Value Date/Time   COLORURINE YELLOW 03/19/2021 0722   APPEARANCEUR HAZY (A) 03/19/2021 0722   LABSPEC 1.028 03/19/2021 0722   PHURINE 5.0 03/19/2021 0722   GLUCOSEU 50 (A) 03/19/2021 0722   HGBUR SMALL (A) 03/19/2021 0722   BILIRUBINUR NEGATIVE 03/19/2021 0722   KETONESUR 5 (A) 03/19/2021 0722   PROTEINUR NEGATIVE 03/19/2021 0722   UROBILINOGEN 0.2 12/04/2009 1301   NITRITE NEGATIVE 03/19/2021 0722   LEUKOCYTESUR NEGATIVE 03/19/2021 0722   Sepsis Labs Invalid input(s): PROCALCITONIN,  WBC,  LACTICIDVEN Microbiology Recent Results (from the past 240 hour(s))  Resp Panel by RT-PCR (Flu A&B, Covid) Nasopharyngeal Swab     Status: None   Collection Time: 03/18/21  8:20 PM   Specimen: Nasopharyngeal Swab; Nasopharyngeal(NP) swabs in vial transport medium  Result Value Ref Range Status   SARS Coronavirus 2 by RT PCR NEGATIVE NEGATIVE Final    Comment: (NOTE) SARS-CoV-2 target nucleic acids are NOT DETECTED.  The SARS-CoV-2 RNA is generally detectable in upper respiratory specimens during the acute phase of infection. The  lowest concentration of SARS-CoV-2 viral copies this assay can detect is 138 copies/mL. A negative result does not preclude SARS-Cov-2 infection and should not be used as the sole basis for treatment or other patient management decisions. A negative result may occur with  improper specimen collection/handling, submission of specimen other than nasopharyngeal swab, presence of viral mutation(s) within the areas targeted by this assay, and inadequate number of viral copies(<138 copies/mL). A negative result must be combined with clinical observations, patient history, and epidemiological information. The expected result is Negative.  Fact Sheet for Patients:  BloggerCourse.com  Fact Sheet for Healthcare Providers:  SeriousBroker.it  This test is no t yet approved or cleared by the [place] FDA and  has been authorized for detection and/or diagnosis of SARS-CoV-2 by FDA under an Emergency Use Authorization (EUA). This EUA will remain  in effect (meaning this test can be used) for the duration of the COVID-19 declaration under Section 564(b)(1) of the Act, 21 U.S.C.section 360bbb-3(b)(1), unless the authorization is terminated  or revoked sooner.       Influenza A by PCR NEGATIVE NEGATIVE Final   Influenza B by PCR NEGATIVE NEGATIVE Final    Comment: (NOTE) The Xpert Xpress SARS-CoV-2/FLU/RSV plus assay is intended as an aid in the diagnosis of influenza from Nasopharyngeal swab specimens and should not be used as a sole basis for treatment. Nasal washings and aspirates are unacceptable for Xpert Xpress SARS-CoV-2/FLU/RSV testing.  Fact Sheet for Patients: BloggerCourse.com  Fact Sheet for Healthcare Providers: SeriousBroker.it  This test is not yet approved or cleared by the [place] FDA and has been authorized for detection and/or diagnosis of SARS-CoV-2 by FDA under  an Emergency Use Authorization (EUA). This EUA will remain in effect (meaning this test can be used) for the duration of the COVID-19 declaration under Section 564(b)(1) of the Act, 21 U.S.C. section 360bbb-3(b)(1), unless the authorization is terminated or revoked.  Performed at [facility], [address] [place], [place] [address]   Blood Culture (routine x 2)     Status: None   Collection Time: 03/18/21  8:20 PM   Specimen: BLOOD  Result Value Ref Range Status   Specimen Description   Final    BLOOD RIGHT ANTECUBITAL Performed at [facility], [address] [place], [place] [address]    Special Requests   Final    BOTTLES DRAWN AEROBIC AND ANAEROBIC Blood Culture adequate volume Performed at [facility], [address] [place], [place] [address]    Culture   Final    NO GROWTH 5 DAYS Performed at [facility], [address], [place], [place] [address]    Report Status 03/24/2021 FINAL  Final  Group A Strep by PCR     Status: None   Collection Time: 03/18/21  8:20 PM   Specimen: Nasopharyngeal Swab; Sterile Swab  Result Value Ref Range Status   Group A Strep by PCR NOT DETECTED NOT DETECTED Final    Comment: Performed at [facility], [address] [place], [place] [address]  Blood Culture (routine x 2)     Status: None   Collection Time: 03/18/21  8:24 PM   Specimen: BLOOD  Result Value Ref Range Status   Specimen Description   Final    BLOOD LEFT ANTECUBITAL Performed at [facility], [address] [place], [place] [address]    Special Requests   Final    BOTTLES DRAWN AEROBIC AND ANAEROBIC Blood Culture adequate volume Performed at [facility], [address] [place], [place] [address]    Culture   Final    NO GROWTH 5 DAYS Performed at [facility], [address] [place], [place] [address]    Report Status 03/24/2021 FINAL  Final  Urine culture     Status: Abnormal    Collection Time: 03/19/21  7:22 AM   Specimen: In/Out Cath Urine  Result Value Ref Range Status   Specimen Description   Final    IN/OUT CATH URINE Performed at [facility], [address] [place], [place] [address]    Special Requests   Final    NONE Performed at [facility], [address] [place], [place] [address]    Culture MULTIPLE SPECIES PRESENT, SUGGEST RECOLLECTION (A)  Final   Report Status 03/20/2021 FINAL  Final  Respiratory (~20 pathogens) panel by PCR     Status: Abnormal   Collection Time: 03/19/21 10:12 AM   Specimen: Nasopharyngeal Swab; Respiratory  Result Value Ref Range Status   Adenovirus NOT DETECTED NOT DETECTED Final   Coronavirus 229E NOT DETECTED NOT DETECTED Final    Comment: (NOTE) The Coronavirus on the Respiratory Panel, DOES NOT test for the novel  Coronavirus (2019 nCoV)    Coronavirus HKU1 NOT DETECTED NOT DETECTED Final   Coronavirus NL63 NOT DETECTED NOT DETECTED Final   Coronavirus OC43 DETECTED (A) NOT DETECTED Final   Metapneumovirus NOT DETECTED NOT DETECTED Final   Rhinovirus / Enterovirus NOT DETECTED NOT DETECTED Final   Influenza A NOT DETECTED NOT DETECTED Final   Influenza B NOT DETECTED NOT DETECTED Final   Parainfluenza Virus 1 NOT DETECTED NOT DETECTED Final   Parainfluenza Virus 2 NOT DETECTED NOT DETECTED Final   Parainfluenza Virus 3 NOT DETECTED NOT DETECTED Final   Parainfluenza Virus 4 NOT  DETECTED NOT DETECTED Final   Respiratory Syncytial Virus NOT DETECTED NOT DETECTED Final   Bordetella pertussis NOT DETECTED NOT DETECTED Final   Bordetella Parapertussis NOT DETECTED NOT DETECTED Final   Chlamydophila pneumoniae NOT DETECTED NOT DETECTED Final   Mycoplasma pneumoniae NOT DETECTED NOT DETECTED Final    Comment: Performed at [facility], [address], [place], [place] [address]  Culture, blood (routine x 2)     Status: None (Preliminary result)   Collection Time:  03/23/21  5:25 PM   Specimen: BLOOD  Result Value Ref Range Status   Specimen Description   Final    BLOOD LEFT ANTECUBITAL Performed at [facility], [address] [place], [place] [address]    Special Requests   Final    BOTTLES DRAWN AEROBIC AND ANAEROBIC Blood Culture adequate volume Performed at [facility], [address] [place], [place] [address]    Culture   Final    NO GROWTH 4 DAYS Performed at [facility], [address] [place], [place] [address]    Report Status PENDING  Incomplete  Resp Panel by RT-PCR (Flu A&B, Covid) Nasopharyngeal Swab     Status: Abnormal   Collection Time: 03/23/21  5:25 PM   Specimen: Nasopharyngeal Swab; Nasopharyngeal(NP) swabs in vial transport medium  Result Value Ref Range Status   SARS Coronavirus 2 by RT PCR POSITIVE (A) NEGATIVE Final    Comment: RESULT CALLED TO, READ BACK BY AND VERIFIED WITH: Bertram Millard, RN @ [phone] 03/23/21 BY SJT (NOTE) SARS-CoV-2 target nucleic acids are DETECTED.  The SARS-CoV-2 RNA is generally detectable in upper respiratory specimens during the acute phase of infection. Positive results are indicative of the presence of the identified virus, but do not rule out bacterial infection or co-infection with other pathogens not detected by the test. Clinical correlation with patient history and other diagnostic information is necessary to determine patient infection status. The expected result is Negative.  Fact Sheet for Patients: BloggerCourse.com  Fact Sheet for Healthcare Providers: SeriousBroker.it  This test is not yet approved or cleared by the [place] FDA and  has been authorized for detection and/or diagnosis of SARS-CoV-2 by FDA under an Emergency Use Authorization (EUA).  This EUA will remain in effect (meaning this test c an be used) for the duration of  the COVID-19 declaration under Section  564(b)(1) of the Act, 21 U.S.C. section 360bbb-3(b)(1), unless the authorization is terminated or revoked sooner.     Influenza A by PCR NEGATIVE NEGATIVE Final   Influenza B by PCR NEGATIVE NEGATIVE Final    Comment: (NOTE) The Xpert Xpress SARS-CoV-2/FLU/RSV plus assay is intended as an aid in the diagnosis of influenza from Nasopharyngeal swab specimens and should not be used as a sole basis for treatment. Nasal washings and aspirates are unacceptable for Xpert Xpress SARS-CoV-2/FLU/RSV testing.  Fact Sheet for Patients: BloggerCourse.com  Fact Sheet for Healthcare Providers: SeriousBroker.it  This test is not yet approved or cleared by the [place] FDA and has been authorized for detection and/or diagnosis of SARS-CoV-2 by FDA under an Emergency Use Authorization (EUA). This EUA will remain in effect (meaning this test can be used) for the duration of the COVID-19 declaration under Section 564(b)(1) of the Act, 21 U.S.C. section 360bbb-3(b)(1), unless the authorization is terminated or revoked.  Performed at [facility], [address], [place], [place] [address]   Respiratory (~20 pathogens) panel by PCR  Status: Abnormal   Collection Time: 03/23/21  6:56 PM  Result Value Ref Range Status   Adenovirus NOT DETECTED NOT DETECTED Final   Coronavirus 229E NOT DETECTED NOT DETECTED Final    Comment: (NOTE) The Coronavirus on the Respiratory Panel, DOES NOT test for the novel  Coronavirus (2019 nCoV)    Coronavirus HKU1 NOT DETECTED NOT DETECTED Final   Coronavirus NL63 NOT DETECTED NOT DETECTED Final   Coronavirus OC43 DETECTED (A) NOT DETECTED Final   Metapneumovirus NOT DETECTED NOT DETECTED Final   Rhinovirus / Enterovirus NOT DETECTED NOT DETECTED Final   Influenza A NOT DETECTED NOT DETECTED Final   Influenza B NOT DETECTED NOT DETECTED Final   Parainfluenza Virus 1 NOT DETECTED NOT  DETECTED Final   Parainfluenza Virus 2 NOT DETECTED NOT DETECTED Final   Parainfluenza Virus 3 NOT DETECTED NOT DETECTED Final   Parainfluenza Virus 4 NOT DETECTED NOT DETECTED Final   Respiratory Syncytial Virus NOT DETECTED NOT DETECTED Final   Bordetella pertussis NOT DETECTED NOT DETECTED Final   Bordetella Parapertussis NOT DETECTED NOT DETECTED Final   Chlamydophila pneumoniae NOT DETECTED NOT DETECTED Final   Mycoplasma pneumoniae NOT DETECTED NOT DETECTED Final    Comment: Performed at [facility], [address] [place], [place] [address]  Culture, blood (routine x 2)     Status: None (Preliminary result)   Collection Time: 03/23/21  7:37 PM   Specimen: BLOOD  Result Value Ref Range Status   Specimen Description   Final    BLOOD SITE NOT SPECIFIED Performed at [facility], [address] [place], [place] [address]    Special Requests   Final    BOTTLES DRAWN AEROBIC AND ANAEROBIC Blood Culture results may not be optimal due to an excessive volume of blood received in culture bottles Performed at [facility], [address], [place], [place] [address]    Culture   Final    NO GROWTH 4 DAYS Performed at [facility], [address], [place], [place] [address]    Report Status PENDING  Incomplete  MRSA PCR Screening     Status: None   Collection Time: 03/24/21 12:58 AM   Specimen: Nasal Mucosa; Nasopharyngeal  Result Value Ref Range Status   MRSA by PCR NEGATIVE NEGATIVE Final    Comment:        The GeneXpert MRSA Assay (FDA approved for NASAL specimens only), is one component of a comprehensive MRSA colonization surveillance program. It is not intended to diagnose MRSA infection nor to guide or monitor treatment for MRSA infections. Performed at [facility], [address] [place], [place] [address]      Time coordinating discharge: Over 30 minutes  SIGNED:   Alvira Philips [place], DO  [facility] 03/27/2021, 10:04 AM

## 2021-03-28 LAB — CULTURE, BLOOD (ROUTINE X 2)
Culture: NO GROWTH
Culture: NO GROWTH
Special Requests: ADEQUATE

## 2021-03-30 ENCOUNTER — Other Ambulatory Visit: Payer: Self-pay

## 2021-03-30 ENCOUNTER — Encounter (HOSPITAL_COMMUNITY): Payer: Self-pay

## 2021-03-30 ENCOUNTER — Emergency Department (HOSPITAL_COMMUNITY): Payer: Medicaid Other

## 2021-03-30 ENCOUNTER — Emergency Department (HOSPITAL_COMMUNITY)
Admission: EM | Admit: 2021-03-30 | Discharge: 2021-03-30 | Disposition: A | Discharge status: Home / Self Care | Payer: Medicaid Other | Attending: Emergency Medicine | Admitting: Emergency Medicine

## 2021-03-30 DIAGNOSIS — I129 Hypertensive chronic kidney disease with stage 1 through stage 4 chronic kidney disease, or unspecified chronic kidney disease: Secondary | ICD-10-CM | POA: Diagnosis not present

## 2021-03-30 DIAGNOSIS — R531 Weakness: Secondary | ICD-10-CM | POA: Diagnosis not present

## 2021-03-30 DIAGNOSIS — R791 Abnormal coagulation profile: Secondary | ICD-10-CM | POA: Insufficient documentation

## 2021-03-30 DIAGNOSIS — Z7982 Long term (current) use of aspirin: Secondary | ICD-10-CM | POA: Diagnosis not present

## 2021-03-30 DIAGNOSIS — R0682 Tachypnea, not elsewhere classified: Secondary | ICD-10-CM | POA: Diagnosis not present

## 2021-03-30 DIAGNOSIS — R0602 Shortness of breath: Secondary | ICD-10-CM | POA: Diagnosis present

## 2021-03-30 DIAGNOSIS — U071 COVID-19: Secondary | ICD-10-CM | POA: Insufficient documentation

## 2021-03-30 DIAGNOSIS — E1122 Type 2 diabetes mellitus with diabetic chronic kidney disease: Secondary | ICD-10-CM | POA: Insufficient documentation

## 2021-03-30 DIAGNOSIS — R55 Syncope and collapse: Secondary | ICD-10-CM | POA: Insufficient documentation

## 2021-03-30 DIAGNOSIS — J45901 Unspecified asthma with (acute) exacerbation: Secondary | ICD-10-CM | POA: Diagnosis not present

## 2021-03-30 DIAGNOSIS — N189 Chronic kidney disease, unspecified: Secondary | ICD-10-CM | POA: Diagnosis not present

## 2021-03-30 DIAGNOSIS — R062 Wheezing: Secondary | ICD-10-CM | POA: Diagnosis not present

## 2021-03-30 LAB — CBC WITH DIFFERENTIAL/PLATELET
Abs Immature Granulocytes: 0.08 10*3/uL — ABNORMAL HIGH (ref 0.00–0.07)
Basophils Absolute: 0 10*3/uL (ref 0.0–0.1)
Basophils Relative: 0 %
Eosinophils Absolute: 0.2 10*3/uL (ref 0.0–0.5)
Eosinophils Relative: 2 %
HCT: 33.7 % — ABNORMAL LOW (ref 36.0–46.0)
Hemoglobin: 10.2 g/dL — ABNORMAL LOW (ref 12.0–15.0)
Immature Granulocytes: 1 %
Lymphocytes Relative: 33 %
Lymphs Abs: 4.8 10*3/uL — ABNORMAL HIGH (ref 0.7–4.0)
MCH: 27.5 pg (ref 26.0–34.0)
MCHC: 30.3 g/dL (ref 30.0–36.0)
MCV: 90.8 fL (ref 80.0–100.0)
Monocytes Absolute: 0.4 10*3/uL (ref 0.1–1.0)
Monocytes Relative: 3 %
Neutro Abs: 8.9 10*3/uL — ABNORMAL HIGH (ref 1.7–7.7)
Neutrophils Relative %: 61 %
Platelets: 264 10*3/uL (ref 150–400)
RBC: 3.71 MIL/uL — ABNORMAL LOW (ref 3.87–5.11)
RDW: 15.9 % — ABNORMAL HIGH (ref 11.5–15.5)
WBC: 14.4 10*3/uL — ABNORMAL HIGH (ref 4.0–10.5)
nRBC: 0 % (ref 0.0–0.2)

## 2021-03-30 LAB — PROTIME-INR
INR: 0.9 (ref 0.8–1.2)
Prothrombin Time: 12.2 seconds (ref 11.4–15.2)

## 2021-03-30 LAB — COMPREHENSIVE METABOLIC PANEL
ALT: 26 U/L (ref 0–44)
AST: 14 U/L — ABNORMAL LOW (ref 15–41)
Albumin: 2.9 g/dL — ABNORMAL LOW (ref 3.5–5.0)
Alkaline Phosphatase: 56 U/L (ref 38–126)
Anion gap: 7 (ref 5–15)
BUN: 12 mg/dL (ref 6–20)
CO2: 28 mmol/L (ref 22–32)
Calcium: 8.4 mg/dL — ABNORMAL LOW (ref 8.9–10.3)
Chloride: 104 mmol/L (ref 98–111)
Creatinine, Ser: 0.68 mg/dL (ref 0.44–1.00)
GFR, Estimated: >60 mL/min (ref >60)
Glucose, Bld: 146 mg/dL — ABNORMAL HIGH (ref 70–99)
Potassium: 3.8 mmol/L (ref 3.5–5.1)
Sodium: 139 mmol/L (ref 135–145)
Total Bilirubin: 0.3 mg/dL (ref 0.3–1.2)
Total Protein: 5.8 g/dL — ABNORMAL LOW (ref 6.5–8.1)

## 2021-03-30 LAB — BRAIN NATRIURETIC PEPTIDE: B Natriuretic Peptide: 27.9 pg/mL (ref 0.0–100.0)

## 2021-03-30 LAB — I-STAT BETA HCG BLOOD, ED (MC, WL, AP ONLY): I-stat hCG, quantitative: <5 m[IU]/mL (ref <5)

## 2021-03-30 LAB — TROPONIN I (HIGH SENSITIVITY): Troponin I (High Sensitivity): 4 ng/L (ref <18)

## 2021-03-30 LAB — LIPASE, BLOOD: Lipase: 27 U/L (ref 11–51)

## 2021-03-30 IMAGING — DX DG CHEST 1V
1 series · 1 of 1 positions shown · non-contrast
Comparison: Chest radiograph [DATE]

CLINICAL DATA: Diagnosed with COVID [DATE] SOB increasingly worse
past 24 hours

EXAM:
CHEST  1 VIEW

[chest ap]
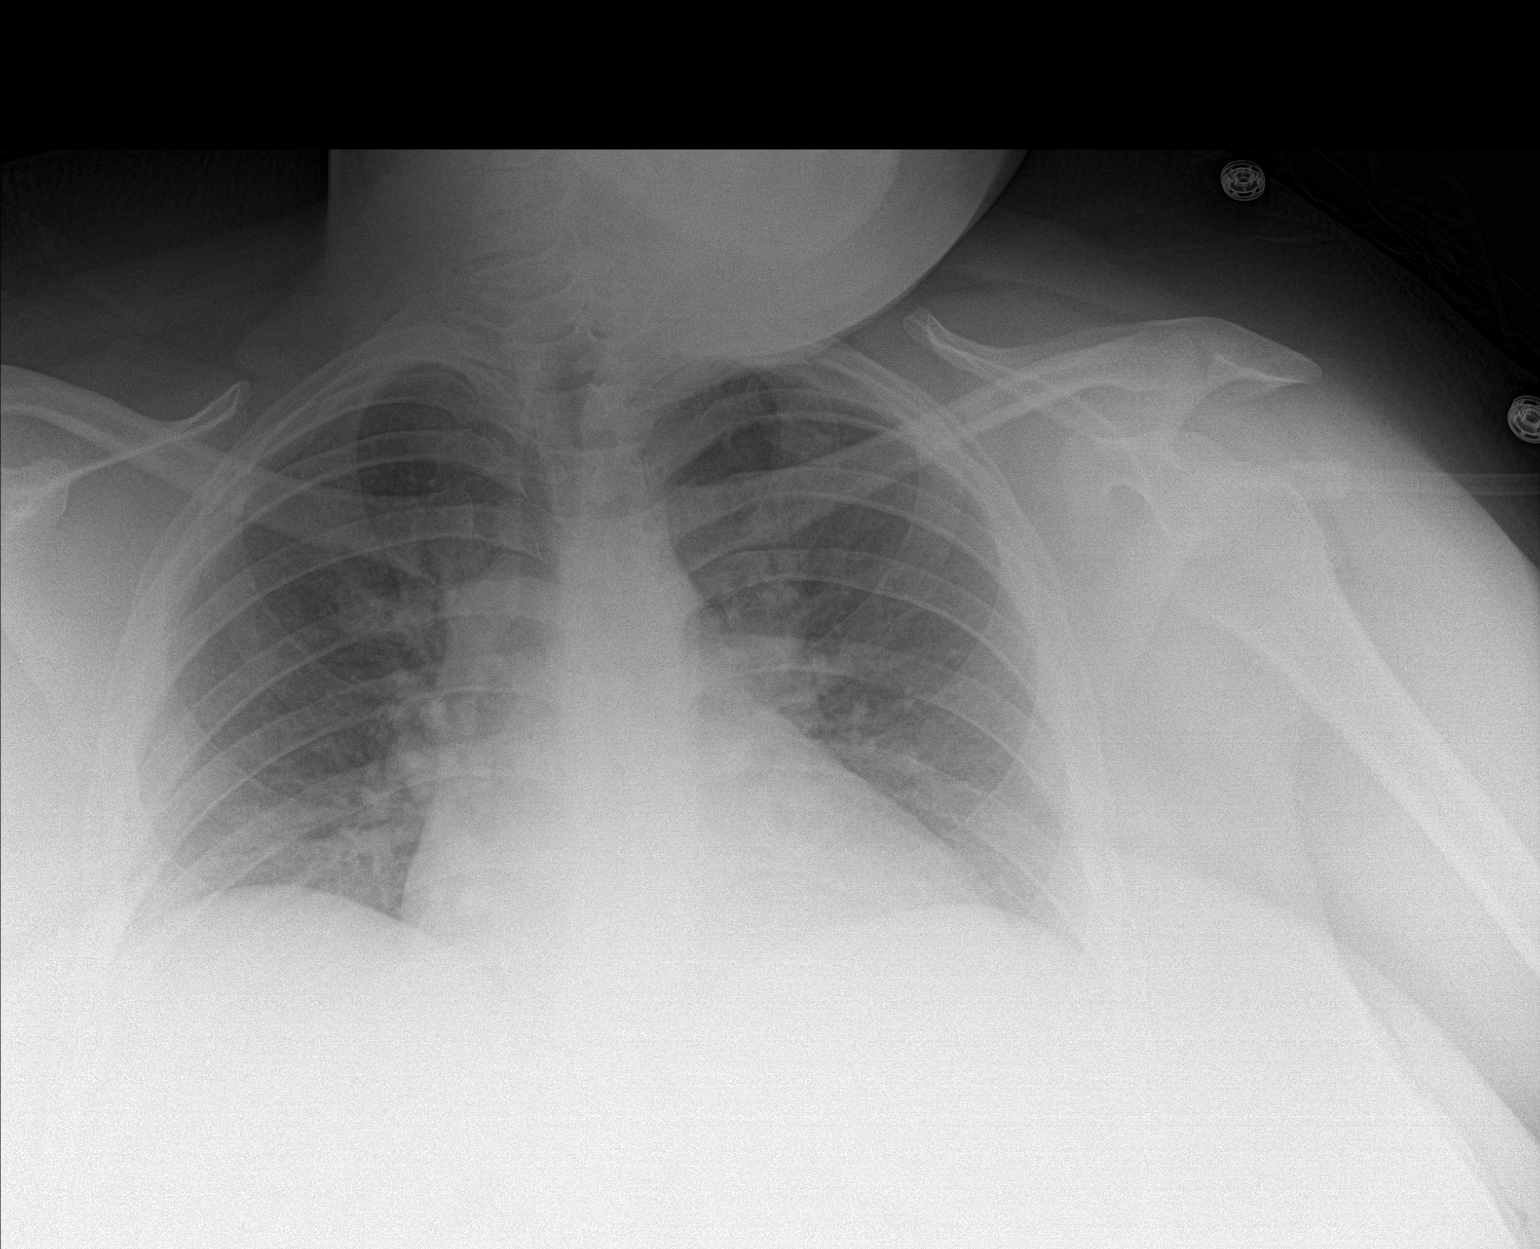

[1 of 1 positions shown; findings below may reference images not displayed]

FINDINGS: Exam is significantly limited by AP portable technique and low lung
volumes.

Stable cardiomediastinal contours with enlarged cardiac silhouette,
possibly exaggerated by technique. Low lung volumes with
bronchovascular crowding. No new focal opacity identified. No
pneumothorax or large pleural effusion. No acute finding in the
visualized skeleton.
IMPRESSION: Limited low volume study.  No definite acute process.

## 2021-03-30 MED ORDER — ONDANSETRON HCL 4 MG PO TABS: 4.0000 mg | ORAL_TABLET | Freq: Four times a day (QID) | ORAL | 0 refills | Status: DC

## 2021-03-30 MED ORDER — AEROCHAMBER PLUS FLO-VU MEDIUM MISC
1.0000 | Freq: Once | Status: AC
  Administered 2021-03-30: 1
  Filled 2021-03-30: qty 1

## 2021-03-30 MED ORDER — METHYLPREDNISOLONE SODIUM SUCC 125 MG IJ SOLR
125.0000 mg | Freq: Once | INTRAMUSCULAR | Status: AC
  Administered 2021-03-30: 125 mg via INTRAVENOUS
  Filled 2021-03-30: qty 2

## 2021-03-30 MED ORDER — ALBUTEROL SULFATE HFA 108 (90 BASE) MCG/ACT IN AERS
6.0000 | INHALATION_SPRAY | Freq: Once | RESPIRATORY_TRACT | Status: AC
  Administered 2021-03-30: 6 via RESPIRATORY_TRACT

## 2021-03-30 MED ORDER — ALBUTEROL SULFATE HFA 108 (90 BASE) MCG/ACT IN AERS
6.0000 | INHALATION_SPRAY | Freq: Once | RESPIRATORY_TRACT | Status: AC
  Administered 2021-03-30: 6 via RESPIRATORY_TRACT
  Filled 2021-03-30: qty 6.7

## 2021-03-30 MED ORDER — ONDANSETRON HCL 4 MG/2ML IJ SOLN
4.0000 mg | Freq: Once | INTRAMUSCULAR | Status: AC
  Administered 2021-03-30: 4 mg via INTRAVENOUS
  Filled 2021-03-30: qty 2

## 2021-03-30 NOTE — ED Notes (Signed)
Pt given water and crackers to PO challenge

## 2021-03-30 NOTE — ED Notes (Signed)
Pt assisted to BSC

## 2021-03-30 NOTE — ED Provider Notes (Signed)
[facility]-EMERGENCY DEPT Provider Note   CSN: [id number] Arrival date & time: 03/30/21  1825     History Chief Complaint  Patient presents with  . Shortness of Breath    Felicia Bradley is a 30 y.o. female.  HPI   Pt is a 30 y/o female with a h/o asthma, bipolar disorder, diabetes, VT, hypertension, obesity who presents to the ED today for eval of generalized weakness, diarrhea, and lightheadedness. She was recently admitted to the hospital for Covid and was discharged 3 days ago.  States since she has been home she is had no improvement in symptoms and has continued to have vomiting, diarrhea, shortness of breath, cough. She states that her shortness of breath is unchanged from when she was discharged from the hospital and that her main complaint is the generalized weakness.  She states she has two 18-year-old at home and she does not know what she supposed to do given that she is weak from Covid.  Past Medical History:  Diagnosis Date  . Asthma   . Bipolar disorder (HCC)   . Diabetes mellitus without complication (HCC)    type 2  . DVT (deep venous thrombosis) (HCC) 2015   w/ recannulation per Care Everywhere  . Hypertension   . Obesity   . Pulmonary embolism (HCC) 2015    Patient Active Problem List   Diagnosis Date Noted  . History of DVT (deep vein thrombosis) 03/23/2021  . History of pulmonary embolus (PE) 03/23/2021  . PTSD (post-traumatic stress disorder) 03/23/2021  . Bipolar 1 disorder (HCC) 03/23/2021  . BMI 50.0-59.9, adult (HCC) 03/23/2021  . SIRS (systemic inflammatory response syndrome) (HCC) 03/19/2021  . Chest pain 03/19/2021  . Emesis 03/19/2021  . Malingering 01/22/2020  . Acute respiratory disease due to COVID-19 virus 01/18/2020  . Asthma 01/18/2020  . Asthma exacerbation 01/14/2020  . Type 2 DM with CKD and hypertension (HCC) 01/14/2020  . OSA (obstructive sleep apnea) 01/14/2020  . Suspected COVID-19 virus infection      Past Surgical History:  Procedure Laterality Date  . CESAREAN SECTION       OB History    Gravida  3   Para      Term      Preterm      AB  2   Living  0     SAB  1   IAB  1   Ectopic      Multiple      Live Births              Family History  Problem Relation Age of Onset  . Diabetes Mother   . Asthma Mother   . Asthma Maternal Aunt   . Diabetes Maternal Aunt     Social History   Tobacco Use  . Smoking status: Never Smoker  . Smokeless tobacco: Never Used  Substance Use Topics  . Alcohol use: No  . Drug use: No    Home Medications Prior to Admission medications   Medication Sig Start Date End Date Taking? Authorizing Provider  ondansetron (ZOFRAN) 4 MG tablet Take 1 tablet (4 mg total) by mouth every 6 (six) hours. 03/30/21  Yes Areana Kosanke S, PA-C  acetaminophen (TYLENOL) 500 MG tablet Take 500 mg by mouth every 6 (six) hours as needed for mild pain.    [provider]  albuterol (VENTOLIN HFA) 108 (90 Base) MCG/ACT inhaler Inhale 1 puff into the lungs 2 (two) times daily as needed  for wheezing or shortness of breath.  11/10/14   [provider]  ALPRAZolam Prudy Feeler) 0.5 MG tablet Take 0.5 mg by mouth 2 (two) times daily. 01/23/21   [provider]  ARIPiprazole (ABILIFY) 2 MG tablet Take 2 mg by mouth daily.    [provider]  aspirin EC 81 MG tablet Take 81 mg by mouth daily. Swallow whole.    [provider]  brexpiprazole (REXULTI) 2 MG TABS tablet Take 2 mg by mouth daily.    [provider]  budesonide-formoterol (SYMBICORT) 160-4.5 MCG/ACT inhaler Inhale 2 puffs into the lungs 2 (two) times daily. 03/27/21   [place], Eric J, DO  busPIRone (BUSPAR) 7.5 MG tablet Take 7.5 mg by mouth in the morning and at bedtime.    [provider]  celecoxib (CELEBREX) 100 MG capsule Take 100 mg by mouth 2 (two) times daily as needed for pain. 02/24/21   [provider]   cyclobenzaprine (FLEXERIL) 10 MG tablet Take 10 mg by mouth 3 (three) times daily as needed for muscle spasms.    [provider]  diclofenac Sodium (VOLTAREN) 1 % GEL Apply 2 g topically 4 (four) times daily. Patient taking differently: Apply 2 g topically 2 (two) times daily as needed (pain). 06/28/20   Hall-Potvin, [place], PA-C  EPINEPHrine 0.3 mg/0.3 mL IJ SOAJ injection Inject 0.3 mg into the muscle as needed for anaphylaxis.  11/10/14   [provider]  ferrous sulfate 325 (65 FE) MG EC tablet Take 325 mg by mouth 2 (two) times daily with a meal. 11/10/14   [provider]  folic acid (FOLVITE) 1 MG tablet Take 1 tablet (1 mg total) by mouth daily. 03/22/21   Marguerita Merles Latif, DO  gabapentin (NEURONTIN) 300 MG capsule Take 300 mg by mouth 3 (three) times daily.    [provider]  guaiFENesin-dextromethorphan (ROBITUSSIN DM) 100-10 MG/5ML syrup Take 5 mLs by mouth every 4 (four) hours as needed for cough. 03/27/21   [place], Eric J, DO  ipratropium-albuterol (DUONEB) 0.5-2.5 (3) MG/3ML SOLN Take 3 mLs by nebulization in the morning, at noon, and at bedtime. 07/19/19   [provider]  LATUDA 20 MG TABS tablet Take 20 mg by mouth daily. 11/19/20   [provider]  Multiple Vitamin (MULTIVITAMIN WITH MINERALS) TABS tablet Take 1 tablet by mouth daily.    [provider]  oxyCODONE-acetaminophen (PERCOCET) 10-325 MG tablet Take 1 tablet by mouth 3 (three) times daily as needed for severe pain. 02/24/21   [provider]  predniSONE (DELTASONE) 10 MG tablet Take 4 tablets (40 mg total) by mouth daily for 2 days, THEN 3 tablets (30 mg total) daily for 2 days, THEN 2 tablets (20 mg total) daily for 2 days, THEN 1 tablet (10 mg total) daily for 2 days. 03/27/21 04/04/21  [place], Eric J, DO  senna-docusate (SENOKOT-S) 8.6-50 MG tablet Take 1 tablet by mouth at bedtime. 03/21/21   Marguerita Merles Latif, DO  sertraline (ZOLOFT) 50 MG tablet  Take 50 mg by mouth daily. 01/13/21   [provider]  sucralfate (CARAFATE) 1 GM/10ML suspension Take 1 g by mouth 2 (two) times daily before a meal.    [provider]  traZODone (DESYREL) 100 MG tablet Take 200 mg by mouth at bedtime. 11/19/20   [provider]    Allergies    Contrast media [iodinated diagnostic agents], Dilaudid [hydromorphone], Tramadol, Apple, Banana, Coconut oil, Dexamethasone, Other, Solu-medrol [methylprednisolone], and Toradol [employer]  tromethamine]  Review of Systems   Review of Systems  Constitutional: Negative for fever.  HENT: Negative for ear pain and sore throat.   Eyes: Negative for visual disturbance.  Respiratory: Positive for cough, shortness of breath and wheezing.   Cardiovascular: Positive for chest pain. Negative for leg swelling.  Gastrointestinal: Positive for diarrhea, nausea and vomiting. Negative for abdominal pain.  Genitourinary: Negative for dysuria and hematuria.  Musculoskeletal: Negative for back pain.  Skin: Negative for rash.  Neurological: Positive for light-headedness.  All other systems reviewed and are negative.   Physical Exam Updated Vital Signs BP (!) 117/96   Pulse 88   Temp 98.1 F (36.7 C) (Oral)   Resp 19   LMP 03/11/2021   SpO2 100%   Physical Exam Vitals and nursing note reviewed.  Constitutional:      General: She is not in acute distress.    Appearance: She is well-developed.  HENT:     Head: Normocephalic and atraumatic.  Eyes:     Conjunctiva/sclera: Conjunctivae normal.  Cardiovascular:     Rate and Rhythm: Normal rate and regular rhythm.     Heart sounds: Normal heart sounds. No murmur heard.   Pulmonary:     Effort: Pulmonary effort is normal. Tachypnea present. No respiratory distress.     Breath sounds: Wheezing present.     Comments: Dry cough on exam Abdominal:     Palpations: Abdomen is soft.     Tenderness: There is no abdominal tenderness.   Musculoskeletal:     Cervical back: Neck supple.     Right lower leg: No tenderness.     Left lower leg: No tenderness.     Comments: Trace ble edema  Skin:    General: Skin is warm and dry.  Neurological:     Mental Status: She is alert.     ED Results / Procedures / Treatments   Labs (all labs ordered are listed, but only abnormal results are displayed) Labs Reviewed  COMPREHENSIVE METABOLIC PANEL - Abnormal; Notable for the following components:      Result Value   Glucose, Bld 146 (*)    Calcium 8.4 (*)    Total Protein 5.8 (*)    Albumin 2.9 (*)    AST 14 (*)    All other components within normal limits  CBC WITH DIFFERENTIAL/PLATELET - Abnormal; Notable for the following components:   WBC 14.4 (*)    RBC 3.71 (*)    Hemoglobin 10.2 (*)    HCT 33.7 (*)    RDW 15.9 (*)    Neutro Abs 8.9 (*)    Lymphs Abs 4.8 (*)    Abs Immature Granulocytes 0.08 (*)    All other components within normal limits  LIPASE, BLOOD  PROTIME-INR  BRAIN NATRIURETIC PEPTIDE  I-STAT BETA HCG BLOOD, ED ([facility], [facility], [facility] ONLY)  TROPONIN I (HIGH SENSITIVITY)    EKG EKG Interpretation  Date/Time:  Sunday March 30 2021 19:59:47 EDT Ventricular Rate:  88 PR Interval:  134 QRS Duration: 85 QT Interval:  351 QTC Calculation: 425 R Axis:   72 Text Interpretation: Sinus rhythm no acute st/ts Confirmed by Meridee ScoreButler, Michael [phone]([id number]) on 03/30/2021 8:48:01 PM   Radiology DG Chest 1 View  Result Date: 03/30/2021 CLINICAL DATA:  Diagnosed with COVID 4/10 SOB increasingly worse past 24 hours EXAM: CHEST  1 VIEW COMPARISON:  Chest radiograph 03/23/2021 FINDINGS: Exam is significantly limited by AP portable technique and low lung volumes. Stable cardiomediastinal contours with  enlarged cardiac silhouette, possibly exaggerated by technique. Low lung volumes with bronchovascular crowding. No new focal opacity identified. No pneumothorax or large pleural effusion. No acute finding in the visualized skeleton.  IMPRESSION: Limited low volume study.  No definite acute process. Electronically Signed   By: Emmaline Kluver M.D.   On: 03/30/2021 19:58    Procedures Procedures   Medications Ordered in ED Medications  methylPREDNISolone sodium succinate (SOLU-MEDROL) 125 mg/2 mL injection 125 mg (125 mg Intravenous Given 03/30/21 2005)  ondansetron (ZOFRAN) injection 4 mg (4 mg Intravenous Given 03/30/21 2005)  albuterol (VENTOLIN HFA) 108 (90 Base) MCG/ACT inhaler 6 puff (6 puffs Inhalation Given 03/30/21 2005)  AeroChamber Plus Flo-Vu Medium MISC 1 each (1 each Other Given 03/30/21 2005)  albuterol (VENTOLIN HFA) 108 (90 Base) MCG/ACT inhaler 6 puff (6 puffs Inhalation Given 03/30/21 2140)    ED Course  I have reviewed the triage vital signs and the nursing notes.  Pertinent labs & imaging results that were available during my care of the patient were reviewed by me and considered in my medical decision making (see chart for details).    MDM Rules/Calculators/A&P                          30 year old female presenting for evaluation of shortness of breath, generalized weakness, lightheadedness, nausea vomiting and diarrhea.  Recently admitted for Covid and discharged 3 days ago  Reviewed/interpreted labs CBC shows leukocytosis of 14,000, anemia noted at 10 which appears chronic  - pt on prednisone CMP is grossly unremarkable Lipase negative Trop negative BNP negative INR subtherapeutic Beta hcg neg  EKG with nsr, no acute sttchanges  CXR -  Limited low volume study.  No definite acute process.  Patient's work-up here is reassuring.  Patient's main complaint is generalized weakness which I explained was a common symptom of Covid.  She states her shortness of breath is at baseline from when she was discharged from the hospital and she does not have any significant new symptoms at this time.  Her laboratory work is reassuring.  Her chest x-ray is reassuring and EKG is unchanged.  She has been  tolerating p.o. here in the ED and is at her baseline oxygen requirement.  Her INR was therapeutic therapeutic so she is advised to follow-up with her physician to have her medications titrated.  I do have low suspicion for PE or other emergent process at this time and feel she is appropriate for discharge.  I discussed the results thus far with the patient and the plan for discharge home with symptomatic management and she is agreeable and comfortable with the plan for discharge.  She has no further questions.  Final Clinical Impression(s) / ED Diagnoses Final diagnoses:  COVID  Weakness  Subtherapeutic international normalized ratio (INR)    Rx / DC Orders ED Discharge Orders         Ordered    ondansetron (ZOFRAN) 4 MG tablet  Every 6 hours        03/30/21 2149           Karrie Meres, PA-C 03/30/21 2150    Terrilee Files, MD 03/31/21 1006

## 2021-03-30 NOTE — Discharge Instructions (Signed)
Your work-up today was very reassuring other than the fact that your INR was low.  You will need to have your Coumadin titrated so you will need to contact your physician tomorrow for recommendations about this.  Please continue using your inhalers at home.  I have also given a prescription for Zofran to help if you have any vomiting.  If you have any new or worsening symptoms you are welcome to return to the ED for reassessment.  Otherwise, I recommend that you follow-up with your regular physician in 3 to 5 days for reassessment.

## 2021-03-30 NOTE — ED Triage Notes (Signed)
Was admitted this past week for covid, received antibodies, SOB, chest pain, productive cough with green sputum, 102 at home, tylenol 1 hr ago

## 2021-04-09 ENCOUNTER — Emergency Department (HOSPITAL_BASED_OUTPATIENT_CLINIC_OR_DEPARTMENT_OTHER)
Admission: EM | Admit: 2021-04-09 | Discharge: 2021-04-10 | Disposition: A | Discharge status: Home / Self Care | Payer: Medicaid Other | Attending: Emergency Medicine | Admitting: Emergency Medicine

## 2021-04-09 ENCOUNTER — Encounter (HOSPITAL_BASED_OUTPATIENT_CLINIC_OR_DEPARTMENT_OTHER): Payer: Self-pay

## 2021-04-09 ENCOUNTER — Other Ambulatory Visit: Payer: Self-pay

## 2021-04-09 ENCOUNTER — Emergency Department (HOSPITAL_BASED_OUTPATIENT_CLINIC_OR_DEPARTMENT_OTHER): Payer: Medicaid Other

## 2021-04-09 DIAGNOSIS — R232 Flushing: Secondary | ICD-10-CM | POA: Diagnosis not present

## 2021-04-09 DIAGNOSIS — R079 Chest pain, unspecified: Secondary | ICD-10-CM | POA: Diagnosis present

## 2021-04-09 DIAGNOSIS — U099 Post covid-19 condition, unspecified: Secondary | ICD-10-CM

## 2021-04-09 DIAGNOSIS — B948 Sequelae of other specified infectious and parasitic diseases: Secondary | ICD-10-CM | POA: Diagnosis not present

## 2021-04-09 DIAGNOSIS — R42 Dizziness and giddiness: Secondary | ICD-10-CM | POA: Diagnosis not present

## 2021-04-09 DIAGNOSIS — J45909 Unspecified asthma, uncomplicated: Secondary | ICD-10-CM | POA: Insufficient documentation

## 2021-04-09 DIAGNOSIS — R0789 Other chest pain: Secondary | ICD-10-CM

## 2021-04-09 DIAGNOSIS — Z7982 Long term (current) use of aspirin: Secondary | ICD-10-CM | POA: Diagnosis not present

## 2021-04-09 DIAGNOSIS — Z7901 Long term (current) use of anticoagulants: Secondary | ICD-10-CM | POA: Diagnosis not present

## 2021-04-09 DIAGNOSIS — R0602 Shortness of breath: Secondary | ICD-10-CM | POA: Diagnosis not present

## 2021-04-09 DIAGNOSIS — E119 Type 2 diabetes mellitus without complications: Secondary | ICD-10-CM | POA: Insufficient documentation

## 2021-04-09 DIAGNOSIS — Z7951 Long term (current) use of inhaled steroids: Secondary | ICD-10-CM | POA: Diagnosis not present

## 2021-04-09 DIAGNOSIS — I10 Essential (primary) hypertension: Secondary | ICD-10-CM | POA: Diagnosis not present

## 2021-04-09 LAB — BASIC METABOLIC PANEL WITH GFR
Anion gap: 9 (ref 5–15)
BUN: 11 mg/dL (ref 6–20)
CO2: 25 mmol/L (ref 22–32)
Calcium: 9.1 mg/dL (ref 8.9–10.3)
Chloride: 105 mmol/L (ref 98–111)
Creatinine, Ser: 0.77 mg/dL (ref 0.44–1.00)
GFR, Estimated: >60 mL/min
Glucose, Bld: 228 mg/dL — ABNORMAL HIGH (ref 70–99)
Potassium: 3.5 mmol/L (ref 3.5–5.1)
Sodium: 139 mmol/L (ref 135–145)

## 2021-04-09 LAB — CBC
HCT: 35.6 % — ABNORMAL LOW (ref 36.0–46.0)
Hemoglobin: 10.8 g/dL — ABNORMAL LOW (ref 12.0–15.0)
MCH: 27.6 pg (ref 26.0–34.0)
MCHC: 30.3 g/dL (ref 30.0–36.0)
MCV: 91 fL (ref 80.0–100.0)
Platelets: 320 K/uL (ref 150–400)
RBC: 3.91 MIL/uL (ref 3.87–5.11)
RDW: 16.9 % — ABNORMAL HIGH (ref 11.5–15.5)
WBC: 9 K/uL (ref 4.0–10.5)
nRBC: 0 % (ref 0.0–0.2)

## 2021-04-09 LAB — TROPONIN I (HIGH SENSITIVITY): Troponin I (High Sensitivity): 3 ng/L

## 2021-04-09 IMAGING — DX DG CHEST 2V
2 series · 2 of 2 positions shown · non-contrast
Comparison: [DATE]

CLINICAL DATA: Chest pain and shortness of breath. Positive COVID
on [DATE]. Constant cough.

EXAM:
CHEST - 2 VIEW

[chest pa]
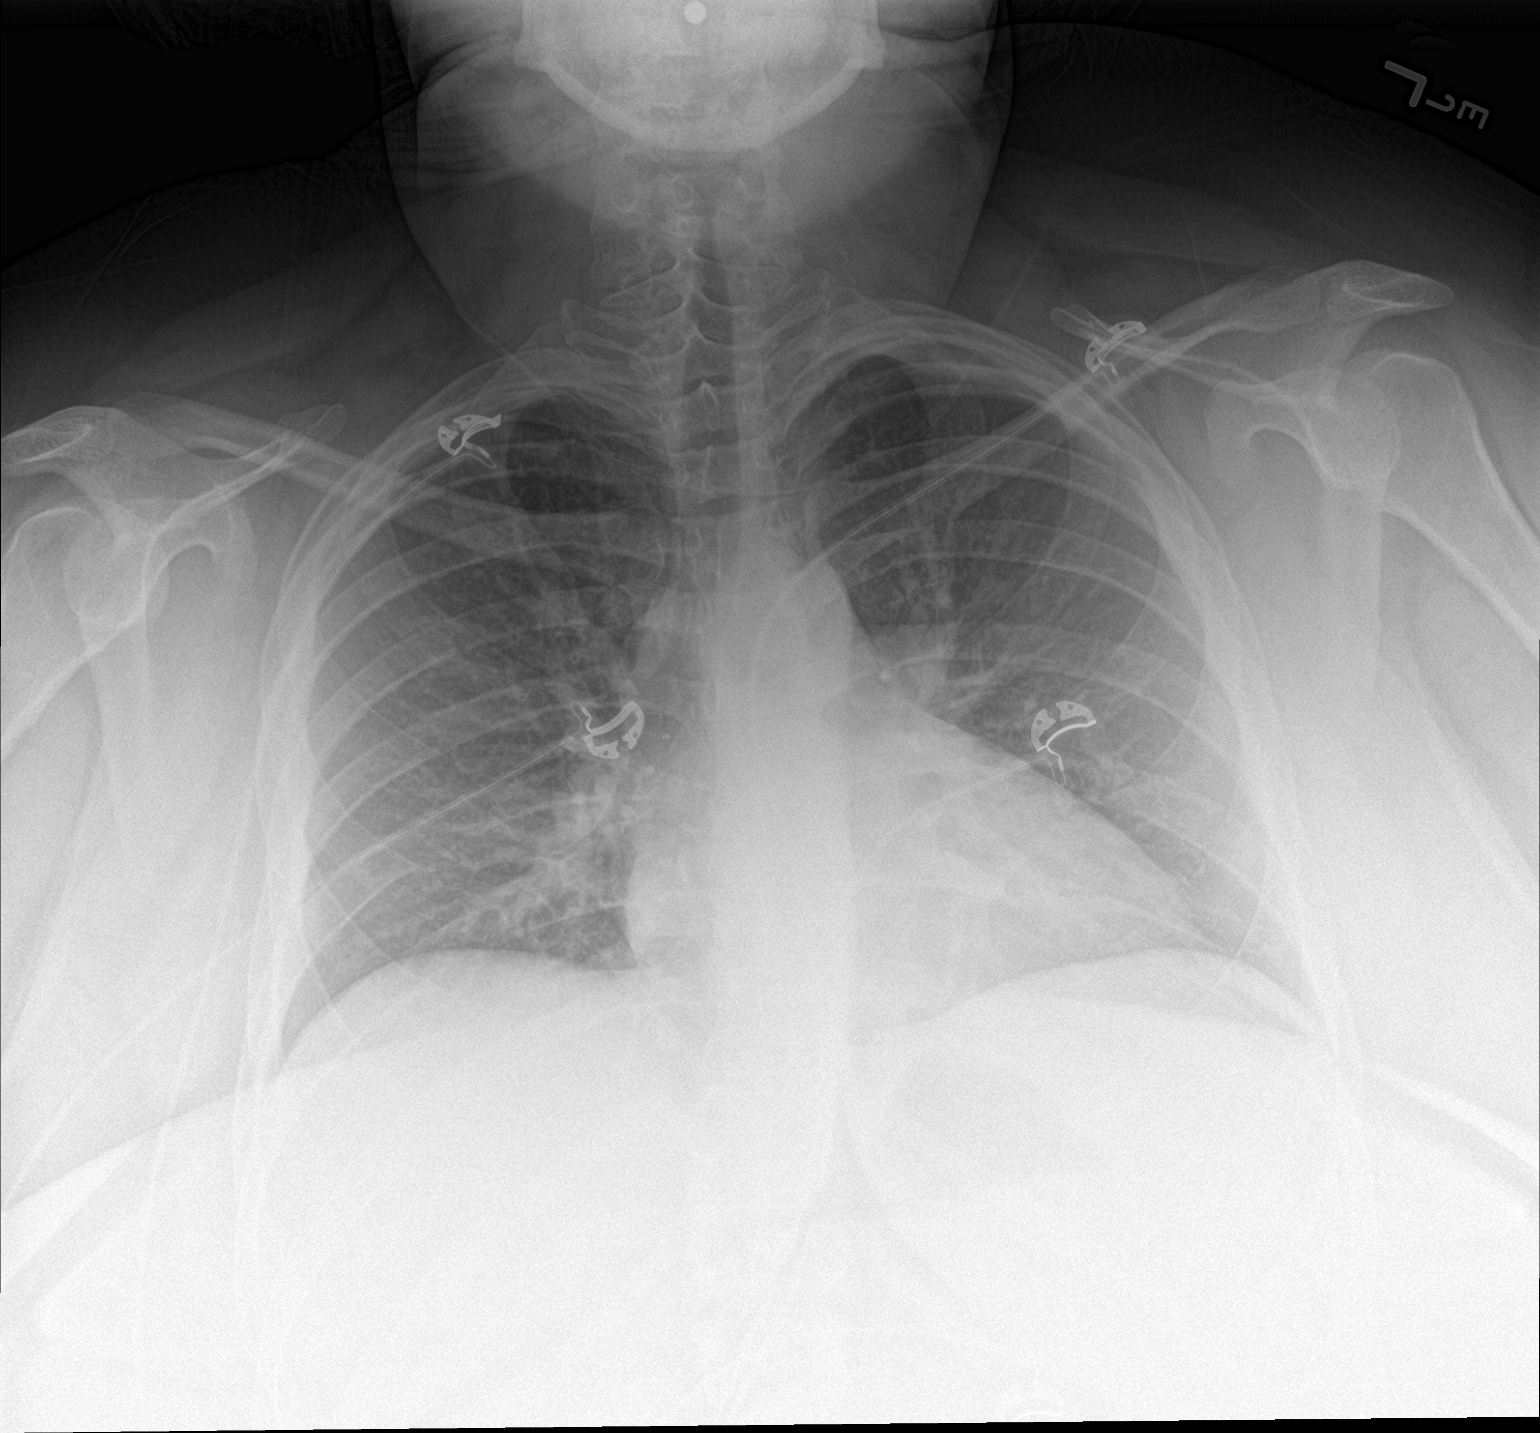

[chest lat]
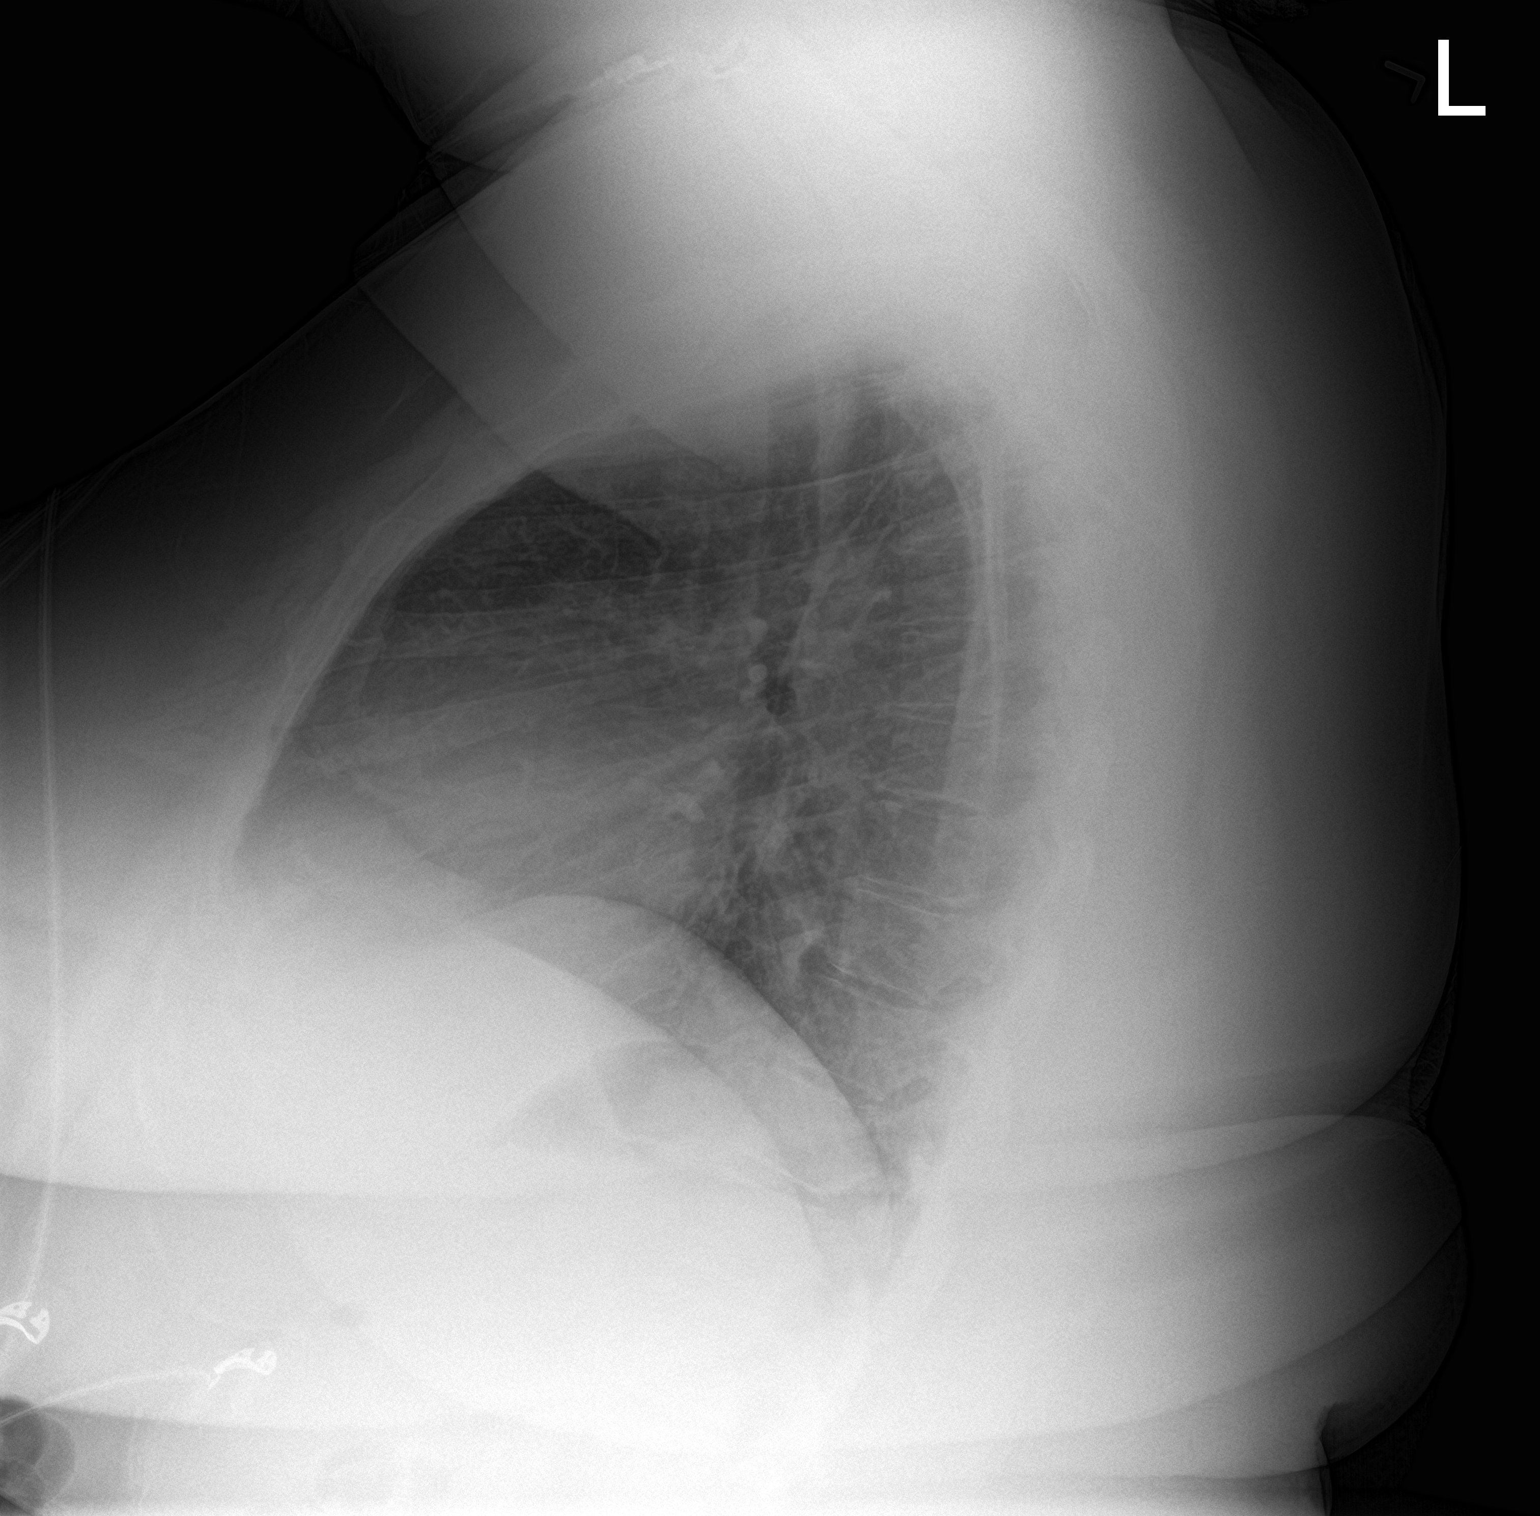

[2 of 2 positions shown; findings below may reference images not displayed]

FINDINGS: Shallow inspiration. Heart size and pulmonary vascularity are
normal. Lungs are clear. No pleural effusions. No pneumothorax.
Mediastinal contours appear intact.
IMPRESSION: No active cardiopulmonary disease.

## 2021-04-09 MED ORDER — OXYCODONE-ACETAMINOPHEN 5-325 MG PO TABS
2.0000 | ORAL_TABLET | Freq: Once | ORAL | Status: AC
  Administered 2021-04-10: 2 via ORAL
  Filled 2021-04-09: qty 2

## 2021-04-09 MED ORDER — WARFARIN SODIUM 1 MG PO TABS
2.0000 mg | ORAL_TABLET | Freq: Once | ORAL | Status: AC
  Administered 2021-04-10: 2 mg via ORAL
  Filled 2021-04-09: qty 2

## 2021-04-09 MED ORDER — ENOXAPARIN SODIUM 100 MG/ML ~~LOC~~ SOLN
100.0000 mg | Freq: Once | SUBCUTANEOUS | Status: AC
  Administered 2021-04-10: 100 mg via SUBCUTANEOUS
  Filled 2021-04-09: qty 1

## 2021-04-09 MED ORDER — ENOXAPARIN SODIUM 300 MG/3ML IJ SOLN: 250.0000 mg | Freq: Once | INTRAMUSCULAR | Status: DC

## 2021-04-09 MED ORDER — ENOXAPARIN SODIUM 150 MG/ML ~~LOC~~ SOLN
150.0000 mg | Freq: Once | SUBCUTANEOUS | Status: AC
  Administered 2021-04-10: 150 mg via SUBCUTANEOUS
  Filled 2021-04-09: qty 1

## 2021-04-09 NOTE — ED Provider Notes (Signed)
[facility] Provider Note: Lowella DellJ. Lane Tiron Suski, MD, FACEP  CSN: [id number] MRN: [id number] ARRIVAL: 04/09/21 at 2230 ROOM: [facility]   CHIEF COMPLAINT  Chest Pain   HISTORY OF PRESENT ILLNESS  04/09/21 11:21 PM Felicia Bradley is a 30 y.o. female with a history of COVID diagnosed 03/15/2021 and a history of DVT and PE (first diagnosed in 2016) on warfarin and Lovenox.  She is here with chest pain that began about 3 hours prior to arrival.  There is a baseline pain that is dull and mild to moderate and present across the precordium.  She also had about a 15-minute episode of sharp pain under her left breast that she rated as a 10 out of 10.  It was associated by lightheadedness, a hot flash and shortness of breath.  It was worse with deep breathing.  That pain has not recurred.  She has not taken her evening pain medication or anticoagulants.  She has a persistent cough since having COVID and took an albuterol breathing treatment prior to arrival.  She states she does not need another breathing treatment at this time.   Past Medical History:  Diagnosis Date  . Asthma   . Bipolar disorder (HCC)   . Diabetes mellitus without complication (HCC)    type 2  . DVT (deep venous thrombosis) (HCC) 2015   w/ recannulation per Care Everywhere  . Hypertension   . Obesity   . Pulmonary embolism (HCC) 2015    Past Surgical History:  Procedure Laterality Date  . CESAREAN SECTION      Family History  Problem Relation Age of Onset  . Diabetes Mother   . Asthma Mother   . Asthma Maternal Aunt   . Diabetes Maternal Aunt     Social History   Tobacco Use  . Smoking status: Never Smoker  . Smokeless tobacco: Never Used  Substance Use Topics  . Alcohol use: No  . Drug use: No    Prior to Admission medications   Medication Sig Start Date End Date Taking? Authorizing Provider  enoxaparin (LOVENOX) 120 MG/0.8ML injection Inject 250 mg into the skin every 12 (twelve) hours.   Yes  [provider]  warfarin (COUMADIN) 2 MG tablet Take 2 mg by mouth daily.   Yes [provider]  acetaminophen (TYLENOL) 500 MG tablet Take 500 mg by mouth every 6 (six) hours as needed for mild pain.    [provider]  albuterol (VENTOLIN HFA) 108 (90 Base) MCG/ACT inhaler Inhale 1 puff into the lungs 2 (two) times daily as needed for wheezing or shortness of breath.  11/10/14   [provider]  ALPRAZolam Prudy Feeler(XANAX) 0.5 MG tablet Take 0.5 mg by mouth 2 (two) times daily. 01/23/21   [provider]  ARIPiprazole (ABILIFY) 2 MG tablet Take 2 mg by mouth daily.    [provider]  aspirin EC 81 MG tablet Take 81 mg by mouth daily. Swallow whole.    [provider]  brexpiprazole (REXULTI) 2 MG TABS tablet Take 2 mg by mouth daily.    [provider]  budesonide-formoterol (SYMBICORT) 160-4.5 MCG/ACT inhaler Inhale 2 puffs into the lungs 2 (two) times daily. 03/27/21   [place], Eric J, DO  busPIRone (BUSPAR) 7.5 MG tablet Take 7.5 mg by mouth in the morning and at bedtime.    [provider]  celecoxib (CELEBREX) 100 MG capsule Take 100 mg by mouth 2 (two) times daily as needed for pain. 02/24/21  [provider]  cyclobenzaprine (FLEXERIL) 10 MG tablet Take 10 mg by mouth 3 (three) times daily as needed for muscle spasms.    [provider]  diclofenac Sodium (VOLTAREN) 1 % GEL Apply 2 g topically 4 (four) times daily. Patient taking differently: Apply 2 g topically 2 (two) times daily as needed (pain). 06/28/20   Hall-Potvin, [place], PA-C  EPINEPHrine 0.3 mg/0.3 mL IJ SOAJ injection Inject 0.3 mg into the muscle as needed for anaphylaxis.  11/10/14   [provider]  ferrous sulfate 325 (65 FE) MG EC tablet Take 325 mg by mouth 2 (two) times daily with a meal. 11/10/14   [provider]  folic acid (FOLVITE) 1 MG tablet Take 1 tablet (1 mg total) by mouth daily. 03/22/21   Marguerita Merles  Latif, DO  gabapentin (NEURONTIN) 300 MG capsule Take 300 mg by mouth 3 (three) times daily.    [provider]  guaiFENesin-dextromethorphan (ROBITUSSIN DM) 100-10 MG/5ML syrup Take 5 mLs by mouth every 4 (four) hours as needed for cough. 03/27/21   [place], Eric J, DO  ipratropium-albuterol (DUONEB) 0.5-2.5 (3) MG/3ML SOLN Take 3 mLs by nebulization in the morning, at noon, and at bedtime. 07/19/19   [provider]  LATUDA 20 MG TABS tablet Take 20 mg by mouth daily. 11/19/20   [provider]  Multiple Vitamin (MULTIVITAMIN WITH MINERALS) TABS tablet Take 1 tablet by mouth daily.    [provider]  ondansetron (ZOFRAN) 4 MG tablet Take 1 tablet (4 mg total) by mouth every 6 (six) hours. 03/30/21   Couture, Cortni S, PA-C  oxyCODONE-acetaminophen (PERCOCET) 10-325 MG tablet Take 1 tablet by mouth 3 (three) times daily as needed for severe pain. 02/24/21   [provider]  senna-docusate (SENOKOT-S) 8.6-50 MG tablet Take 1 tablet by mouth at bedtime. 03/21/21   Marguerita Merles Latif, DO  sertraline (ZOLOFT) 50 MG tablet Take 50 mg by mouth daily. 01/13/21   [provider]  sucralfate (CARAFATE) 1 GM/10ML suspension Take 1 g by mouth 2 (two) times daily before a meal.    [provider]  traZODone (DESYREL) 100 MG tablet Take 200 mg by mouth at bedtime. 11/19/20   [provider]    Allergies Contrast media [iodinated diagnostic agents], Dilaudid [hydromorphone], Tramadol, Apple, Banana, Coconut oil, Dexamethasone, Other, Solu-medrol [methylprednisolone], and Toradol [ketorolac tromethamine]   REVIEW OF SYSTEMS  Negative except as noted here or in the History of Present Illness.   PHYSICAL EXAMINATION  Initial Vital Signs Blood pressure 124/81, pulse (!) 126, temperature 98.9 F (37.2 C), resp. rate (!) 28, last menstrual period 03/11/2021, SpO2 96 %.  Examination General: Well-developed, obese female in no acute distress;  appearance consistent with age of record HENT: normocephalic; atraumatic Eyes: pupils equal, round and reactive to light; extraocular muscles intact Neck: supple Heart: regular rate and rhythm; tachycardia Lungs: clear to auscultation bilaterally; tachypnea; coarse cough Abdomen: soft; nondistended; nontender; bowel sounds present Extremities: No deformity; full range of motion; pulses normal Neurologic: Awake, alert and oriented; motor function intact in all extremities and symmetric; no facial droop Skin: Warm and dry Psychiatric: Normal mood and affect   RESULTS  Summary of this visit's results, reviewed and interpreted by myself:   EKG Interpretation  Date/Time:  Wednesday April 09 2021 22:51:06 EDT Ventricular Rate:  122 PR Interval:  132 QRS Duration: 78 QT Interval:  316 QTC Calculation: 450 R Axis:   91 Text Interpretation: Sinus tachycardia Rightward axis  Borderline ECG Rate is faster Confirmed by Emmanuela Ghazi, Jonny Ruiz ([id number]) on 04/09/2021 10:52:46 PM      Laboratory Studies: Results for orders placed or performed during the hospital encounter of 04/09/21 (from the past 24 hour(s))  Basic metabolic panel     Status: Abnormal   Collection Time: 04/09/21 11:18 PM  Result Value Ref Range   Sodium 139 135 - 145 mmol/L   Potassium 3.5 3.5 - 5.1 mmol/L   Chloride 105 98 - 111 mmol/L   CO2 25 22 - 32 mmol/L   Glucose, Bld 228 (H) 70 - 99 mg/dL   BUN 11 6 - 20 mg/dL   Creatinine, Ser 8.75 0.44 - 1.00 mg/dL   Calcium 9.1 8.9 - 64.3 mg/dL   GFR, Estimated >32 >95 mL/min   Anion gap 9 5 - 15  CBC     Status: Abnormal   Collection Time: 04/09/21 11:18 PM  Result Value Ref Range   WBC 9.0 4.0 - 10.5 K/uL   RBC 3.91 3.87 - 5.11 MIL/uL   Hemoglobin 10.8 (L) 12.0 - 15.0 g/dL   HCT 18.8 (L) 41.6 - 60.6 %   MCV 91.0 80.0 - 100.0 fL   MCH 27.6 26.0 - 34.0 pg   MCHC 30.3 30.0 - 36.0 g/dL   RDW 30.1 (H) 60.1 - 09.3 %   Platelets 320 150 - 400 K/uL   nRBC 0.0 0.0 - 0.2 %  Troponin  I (High Sensitivity)     Status: None   Collection Time: 04/09/21 11:18 PM  Result Value Ref Range   Troponin I (High Sensitivity) 3 <18 ng/L  Pregnancy, urine     Status: None   Collection Time: 04/09/21 11:18 PM  Result Value Ref Range   Preg Test, Ur NEGATIVE NEGATIVE  Troponin I (High Sensitivity)     Status: None   Collection Time: 04/10/21  1:50 AM  Result Value Ref Range   Troponin I (High Sensitivity) 3 <18 ng/L   Imaging Studies: DG Chest 2 View  Result Date: 04/10/2021 CLINICAL DATA:  Chest pain and shortness of breath. Positive COVID on 04/02. Constant cough. EXAM: CHEST - 2 VIEW COMPARISON:  03/30/2021 FINDINGS: Shallow inspiration. Heart size and pulmonary vascularity are normal. Lungs are clear. No pleural effusions. No pneumothorax. Mediastinal contours appear intact. IMPRESSION: No active cardiopulmonary disease. Electronically Signed   By: Burman Nieves M.D.   On: 04/10/2021 00:18    ED COURSE and MDM  Nursing notes, initial and subsequent vitals signs, including pulse oximetry, reviewed and interpreted by myself.  Vitals:   04/10/21 0030 04/10/21 0115 04/10/21 0215 04/10/21 0245  BP: (!) 140/94 128/89 (!) 140/109 (!) 145/87  Pulse: (!) 102 99 (!) 103 (!) 106  Resp: (!) 22 (!) 22 18 17   Temp: 98.9 F (37.2 C)   98.9 F (37.2 C)  TempSrc: Oral   Oral  SpO2: 98% 100% 98% 98%   Medications  oxyCODONE-acetaminophen (PERCOCET/ROXICET) 5-325 MG per tablet 2 tablet (2 tablets Oral Given 04/10/21 0043)  warfarin (COUMADIN) tablet 2 mg (2 mg Oral Given 04/10/21 0043)  enoxaparin (LOVENOX) injection 100 mg (100 mg Subcutaneous Given 04/10/21 0044)    And  enoxaparin (LOVENOX) injection 150 mg (150 mg Subcutaneous Given 04/10/21 0043)   Patient given her evening doses of oxycodone and anticoagulation.  We are unable to get a CT angio chest due to contrast allergy as well as allergies to methylprednisolone and dexamethasone.  Given that she is already on long-term  anticoagulation and is not hypoxic off think the chance of a PE is low at this time.  2:54 AM Patient is EKG not showing any acute ischemic changes and her troponins are normal.  She is not having any chest pain at the present time.  She plans to follow-up with her primary care physician later today.  PROCEDURES  Procedures   ED DIAGNOSES     ICD-10-CM   1. Atypical chest pain  R07.89   2. Multiple persistent symptoms after COVID-19  B94.8        Deron Poole, MD 04/10/21 [phone]

## 2021-04-09 NOTE — ED Triage Notes (Signed)
Pt c/o CP, SOB x 30 min-reports +covid 4/2-pt with constant forceful cough in triage-steady gait

## 2021-04-09 NOTE — ED Notes (Signed)
Pt placed in a gown and hooked up to the monitor with a 5 lead, BP cuff and pulse ox 

## 2021-04-10 LAB — PREGNANCY, URINE: Preg Test, Ur: NEGATIVE

## 2021-04-10 LAB — TROPONIN I (HIGH SENSITIVITY): Troponin I (High Sensitivity): 3 ng/L (ref <18)

## 2021-04-19 ENCOUNTER — Other Ambulatory Visit: Payer: Self-pay

## 2021-04-19 ENCOUNTER — Encounter (HOSPITAL_COMMUNITY): Payer: Self-pay

## 2021-04-19 ENCOUNTER — Emergency Department (HOSPITAL_COMMUNITY)
Admission: EM | Admit: 2021-04-19 | Discharge: 2021-04-19 | Disposition: A | Discharge status: Home / Self Care | Payer: Medicaid Other | Attending: Emergency Medicine | Admitting: Emergency Medicine

## 2021-04-19 ENCOUNTER — Emergency Department (HOSPITAL_COMMUNITY): Payer: Medicaid Other

## 2021-04-19 DIAGNOSIS — R059 Cough, unspecified: Secondary | ICD-10-CM | POA: Diagnosis present

## 2021-04-19 DIAGNOSIS — J4541 Moderate persistent asthma with (acute) exacerbation: Secondary | ICD-10-CM | POA: Diagnosis not present

## 2021-04-19 DIAGNOSIS — N189 Chronic kidney disease, unspecified: Secondary | ICD-10-CM | POA: Insufficient documentation

## 2021-04-19 DIAGNOSIS — E1122 Type 2 diabetes mellitus with diabetic chronic kidney disease: Secondary | ICD-10-CM | POA: Diagnosis not present

## 2021-04-19 DIAGNOSIS — I129 Hypertensive chronic kidney disease with stage 1 through stage 4 chronic kidney disease, or unspecified chronic kidney disease: Secondary | ICD-10-CM | POA: Diagnosis not present

## 2021-04-19 DIAGNOSIS — Z8616 Personal history of COVID-19: Secondary | ICD-10-CM | POA: Insufficient documentation

## 2021-04-19 DIAGNOSIS — I2782 Chronic pulmonary embolism: Secondary | ICD-10-CM | POA: Diagnosis not present

## 2021-04-19 DIAGNOSIS — Z7982 Long term (current) use of aspirin: Secondary | ICD-10-CM | POA: Diagnosis not present

## 2021-04-19 DIAGNOSIS — Z7901 Long term (current) use of anticoagulants: Secondary | ICD-10-CM | POA: Insufficient documentation

## 2021-04-19 DIAGNOSIS — R002 Palpitations: Secondary | ICD-10-CM | POA: Insufficient documentation

## 2021-04-19 DIAGNOSIS — Z7951 Long term (current) use of inhaled steroids: Secondary | ICD-10-CM | POA: Diagnosis not present

## 2021-04-19 LAB — CBC WITH DIFFERENTIAL/PLATELET
Abs Immature Granulocytes: 0.04 10*3/uL (ref 0.00–0.07)
Basophils Absolute: 0 10*3/uL (ref 0.0–0.1)
Basophils Relative: 0 %
Eosinophils Absolute: 0.1 10*3/uL (ref 0.0–0.5)
Eosinophils Relative: 1 %
HCT: 34 % — ABNORMAL LOW (ref 36.0–46.0)
Hemoglobin: 10.3 g/dL — ABNORMAL LOW (ref 12.0–15.0)
Immature Granulocytes: 1 %
Lymphocytes Relative: 38 %
Lymphs Abs: 2.5 10*3/uL (ref 0.7–4.0)
MCH: 27.5 pg (ref 26.0–34.0)
MCHC: 30.3 g/dL (ref 30.0–36.0)
MCV: 90.7 fL (ref 80.0–100.0)
Monocytes Absolute: 0.4 10*3/uL (ref 0.1–1.0)
Monocytes Relative: 6 %
Neutro Abs: 3.5 10*3/uL (ref 1.7–7.7)
Neutrophils Relative %: 54 %
Platelets: 326 10*3/uL (ref 150–400)
RBC: 3.75 MIL/uL — ABNORMAL LOW (ref 3.87–5.11)
RDW: 16.2 % — ABNORMAL HIGH (ref 11.5–15.5)
WBC: 6.5 10*3/uL (ref 4.0–10.5)
nRBC: 0 % (ref 0.0–0.2)

## 2021-04-19 LAB — BASIC METABOLIC PANEL
Anion gap: 8 (ref 5–15)
BUN: 8 mg/dL (ref 6–20)
CO2: 26 mmol/L (ref 22–32)
Calcium: 8.4 mg/dL — ABNORMAL LOW (ref 8.9–10.3)
Chloride: 109 mmol/L (ref 98–111)
Creatinine, Ser: 0.85 mg/dL (ref 0.44–1.00)
GFR, Estimated: >60 mL/min (ref >60)
Glucose, Bld: 135 mg/dL — ABNORMAL HIGH (ref 70–99)
Potassium: 2.9 mmol/L — ABNORMAL LOW (ref 3.5–5.1)
Sodium: 143 mmol/L (ref 135–145)

## 2021-04-19 LAB — PROTIME-INR
INR: 1 (ref 0.8–1.2)
Prothrombin Time: 12.9 seconds (ref 11.4–15.2)

## 2021-04-19 LAB — TROPONIN I (HIGH SENSITIVITY): Troponin I (High Sensitivity): 2 ng/L (ref <18)

## 2021-04-19 IMAGING — DX DG CHEST 1V PORT
1 series · 1 of 1 positions shown · non-contrast
Comparison: Chest radiograph dated [DATE].

CLINICAL DATA: Shortness of breath

EXAM:
PORTABLE CHEST 1 VIEW

[chest ap]
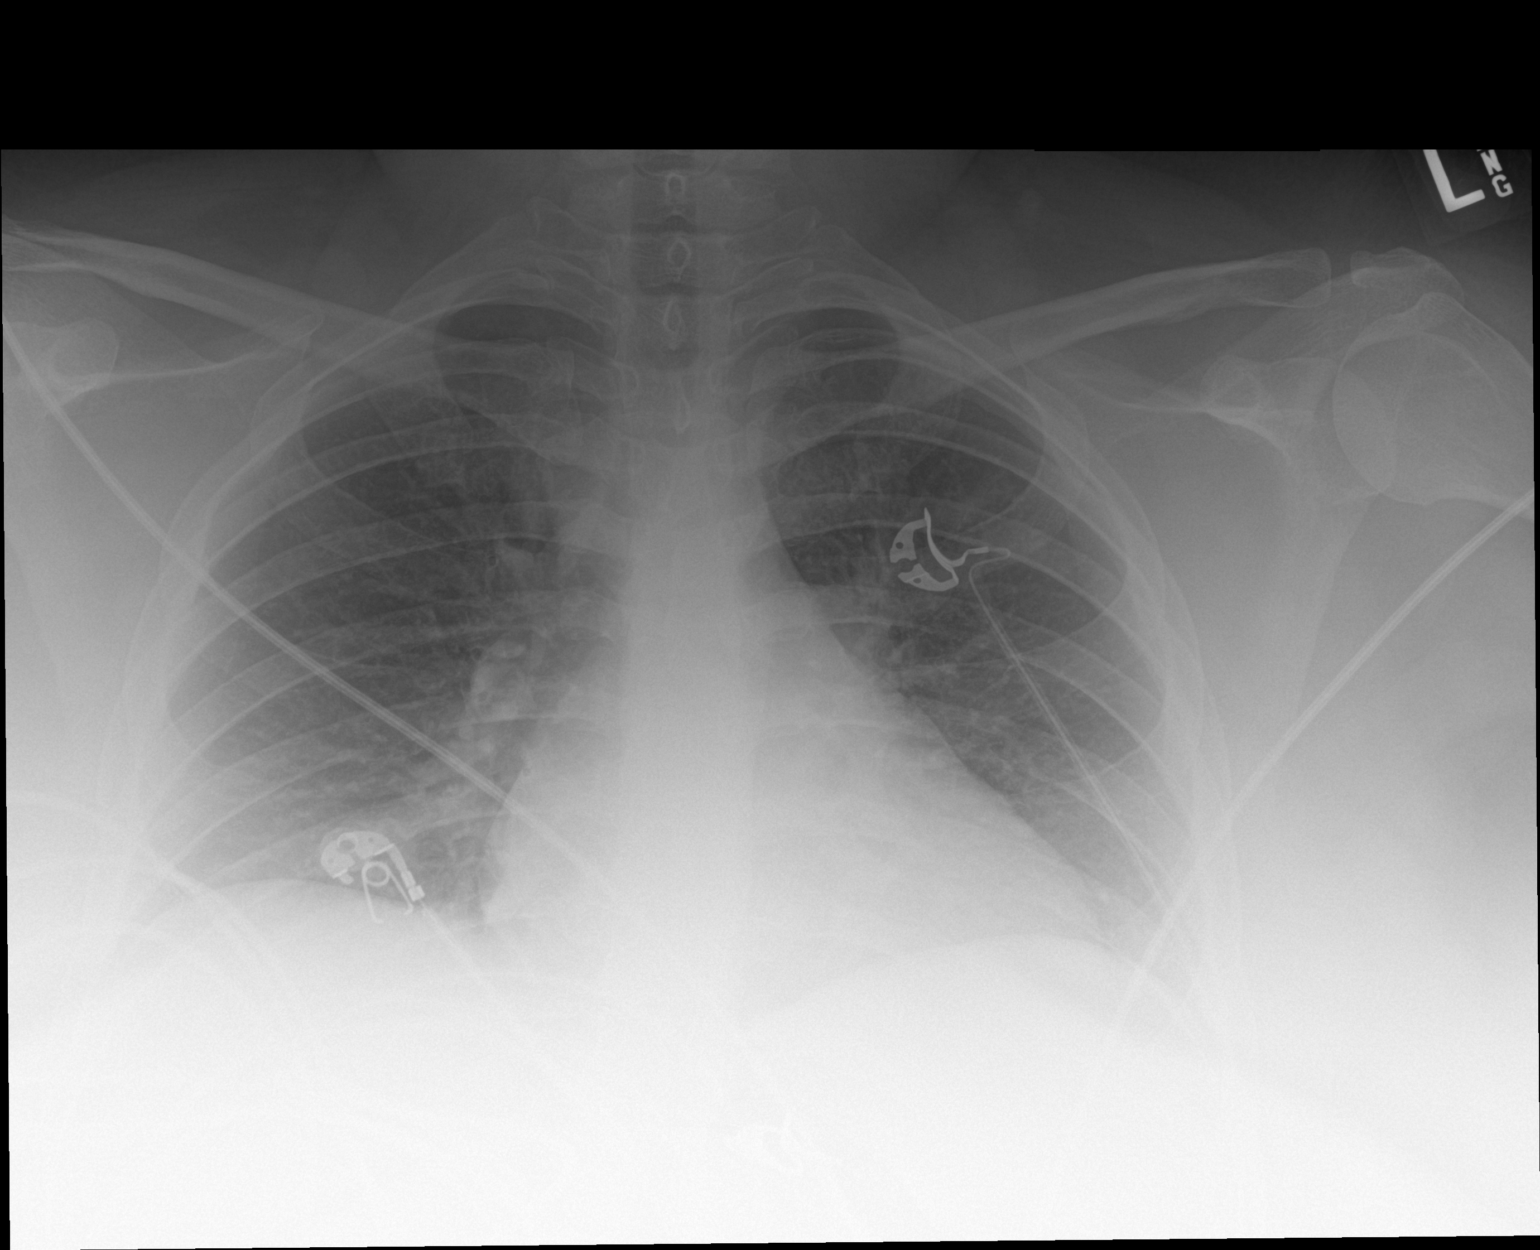

[1 of 1 positions shown; findings below may reference images not displayed]

FINDINGS: The heart size and mediastinal contours are within normal limits.
Both lungs are clear. The visualized skeletal structures are
unremarkable.
IMPRESSION: No active disease.

## 2021-04-19 MED ORDER — POTASSIUM CHLORIDE CRYS ER 20 MEQ PO TBCR
40.0000 meq | EXTENDED_RELEASE_TABLET | Freq: Once | ORAL | Status: AC
  Administered 2021-04-19: 40 meq via ORAL
  Filled 2021-04-19: qty 2

## 2021-04-19 MED ORDER — WARFARIN SODIUM 5 MG PO TABS
5.0000 mg | ORAL_TABLET | Freq: Once | ORAL | Status: AC
  Administered 2021-04-19: 5 mg via ORAL
  Filled 2021-04-19: qty 1

## 2021-04-19 MED ORDER — IPRATROPIUM-ALBUTEROL 0.5-2.5 (3) MG/3ML IN SOLN: 3.0000 mL | Freq: Once | RESPIRATORY_TRACT | Status: DC

## 2021-04-19 MED ORDER — ENOXAPARIN SODIUM 300 MG/3ML IJ SOLN: 1.5000 mg/kg | Freq: Every day | INTRAMUSCULAR | 0 refills | Status: DC

## 2021-04-19 MED ORDER — SODIUM CHLORIDE 0.9 % IV BOLUS
1000.0000 mL | Freq: Once | INTRAVENOUS | Status: AC
  Administered 2021-04-19: 1000 mL via INTRAVENOUS

## 2021-04-19 MED ORDER — POTASSIUM CHLORIDE 10 MEQ/100ML IV SOLN
10.0000 meq | Freq: Once | INTRAVENOUS | Status: AC
  Administered 2021-04-19: 10 meq via INTRAVENOUS
  Filled 2021-04-19: qty 100

## 2021-04-19 MED ORDER — ENOXAPARIN SODIUM 100 MG/ML IJ SOSY
200.0000 mg | PREFILLED_SYRINGE | Freq: Once | INTRAMUSCULAR | Status: AC
  Administered 2021-04-19: 200 mg via SUBCUTANEOUS
  Filled 2021-04-19: qty 2

## 2021-04-19 NOTE — ED Provider Notes (Signed)
[facility]-EMERGENCY DEPT Provider Note   CSN: [id number] Arrival date & time: 04/19/21  1550     History Chief Complaint  Patient presents with  . Shortness of Breath    Felicia Bradley is a 30 y.o. female.  Presents chief complaint of cough asthma exacerbation chest tightness, palpitations.  She states that symptoms have not improved since she was diagnosed with COVID 3 to 4 weeks ago.  She states that she used a breathing treatment at home with some improvement but she feels persistent palpitations.  Denies any chest pain at this time but has intermittent episodes of chest tightness at home worse with cough per patient.  She has a history of pulmonary embolism and has been taking Coumadin at home, admits that she has only been taking it intermittently.        Past Medical History:  Diagnosis Date  . Asthma   . Bipolar disorder (HCC)   . Diabetes mellitus without complication (HCC)    type 2  . DVT (deep venous thrombosis) (HCC) 2015   w/ recannulation per Care Everywhere  . Hypertension   . Obesity   . Pulmonary embolism (HCC) 2015    Patient Active Problem List   Diagnosis Date Noted  . History of DVT (deep vein thrombosis) 03/23/2021  . History of pulmonary embolus (PE) 03/23/2021  . PTSD (post-traumatic stress disorder) 03/23/2021  . Bipolar 1 disorder (HCC) 03/23/2021  . BMI 50.0-59.9, adult (HCC) 03/23/2021  . SIRS (systemic inflammatory response syndrome) (HCC) 03/19/2021  . Chest pain 03/19/2021  . Emesis 03/19/2021  . Malingering 01/22/2020  . Acute respiratory disease due to COVID-19 virus 01/18/2020  . Asthma 01/18/2020  . Asthma exacerbation 01/14/2020  . Type 2 DM with CKD and hypertension (HCC) 01/14/2020  . OSA (obstructive sleep apnea) 01/14/2020  . Suspected COVID-19 virus infection     Past Surgical History:  Procedure Laterality Date  . CESAREAN SECTION       OB History    Gravida  3   Para      Term       Preterm      AB  2   Living  0     SAB  1   IAB  1   Ectopic      Multiple      Live Births              Family History  Problem Relation Age of Onset  . Diabetes Mother   . Asthma Mother   . Asthma Maternal Aunt   . Diabetes Maternal Aunt     Social History   Tobacco Use  . Smoking status: Never Smoker  . Smokeless tobacco: Never Used  Substance Use Topics  . Alcohol use: No  . Drug use: No    Home Medications Prior to Admission medications   Medication Sig Start Date End Date Taking? Authorizing Provider  enoxaparin (LOVENOX) 100 mg/mL SOLN injection Inject 2.05 mLs (205 mg total) into the skin daily for 4 days. 04/19/21 04/23/21 Yes Cheryll Cockayne, MD  acetaminophen (TYLENOL) 500 MG tablet Take 500 mg by mouth every 6 (six) hours as needed for mild pain.    [provider]  albuterol (VENTOLIN HFA) 108 (90 Base) MCG/ACT inhaler Inhale 1 puff into the lungs 2 (two) times daily as needed for wheezing or shortness of breath.  11/10/14   [provider]  ALPRAZolam Prudy Feeler) 0.5 MG tablet Take 0.5 mg by  mouth 2 (two) times daily. 01/23/21   [provider]  ARIPiprazole (ABILIFY) 2 MG tablet Take 2 mg by mouth daily.    [provider]  aspirin EC 81 MG tablet Take 81 mg by mouth daily. Swallow whole.    [provider]  brexpiprazole (REXULTI) 2 MG TABS tablet Take 2 mg by mouth daily.    [provider]  budesonide-formoterol (SYMBICORT) 160-4.5 MCG/ACT inhaler Inhale 2 puffs into the lungs 2 (two) times daily. 03/27/21   [place], Eric J, DO  busPIRone (BUSPAR) 7.5 MG tablet Take 7.5 mg by mouth in the morning and at bedtime.    [provider]  celecoxib (CELEBREX) 100 MG capsule Take 100 mg by mouth 2 (two) times daily as needed for pain. 02/24/21   [provider]  cyclobenzaprine (FLEXERIL) 10 MG tablet Take 10 mg by mouth 3 (three) times daily as needed for muscle spasms.    [provider]  diclofenac Sodium (VOLTAREN) 1 % GEL Apply 2 g topically 4 (four) times daily. Patient taking differently: Apply 2 g topically 2 (two) times daily as needed (pain). 06/28/20   Hall-Potvin, [place], PA-C  EPINEPHrine 0.3 mg/0.3 mL IJ SOAJ injection Inject 0.3 mg into the muscle as needed for anaphylaxis.  11/10/14   [provider]  ferrous sulfate 325 (65 FE) MG EC tablet Take 325 mg by mouth 2 (two) times daily with a meal. 11/10/14   [provider]  folic acid (FOLVITE) 1 MG tablet Take 1 tablet (1 mg total) by mouth daily. 03/22/21   Marguerita Merles Latif, DO  gabapentin (NEURONTIN) 300 MG capsule Take 300 mg by mouth 3 (three) times daily.    [provider]  guaiFENesin-dextromethorphan (ROBITUSSIN DM) 100-10 MG/5ML syrup Take 5 mLs by mouth every 4 (four) hours as needed for cough. 03/27/21   [place], Eric J, DO  ipratropium-albuterol (DUONEB) 0.5-2.5 (3) MG/3ML SOLN Take 3 mLs by nebulization in the morning, at noon, and at bedtime. 07/19/19   [provider]  LATUDA 20 MG TABS tablet Take 20 mg by mouth daily. 11/19/20   [provider]  Multiple Vitamin (MULTIVITAMIN WITH MINERALS) TABS tablet Take 1 tablet by mouth daily.    [provider]  ondansetron (ZOFRAN) 4 MG tablet Take 1 tablet (4 mg total) by mouth every 6 (six) hours. 03/30/21   Couture, Cortni S, PA-C  oxyCODONE-acetaminophen (PERCOCET) 10-325 MG tablet Take 1 tablet by mouth 3 (three) times daily as needed for severe pain. 02/24/21   [provider]  senna-docusate (SENOKOT-S) 8.6-50 MG tablet Take 1 tablet by mouth at bedtime. 03/21/21   Marguerita Merles Latif, DO  sertraline (ZOLOFT) 50 MG tablet Take 50 mg by mouth daily. 01/13/21   [provider]  sucralfate (CARAFATE) 1 GM/10ML suspension Take 1 g by mouth 2 (two) times daily before a meal.    [provider]  traZODone (DESYREL) 100 MG tablet Take 200 mg by mouth at bedtime. 11/19/20    [provider]  warfarin (COUMADIN) 2 MG tablet Take 2 mg by mouth daily.    [provider]    Allergies    Contrast media [iodinated diagnostic agents], Dilaudid [hydromorphone], Tramadol, Apple, Banana, Coconut oil, Dexamethasone, Other, Solu-medrol [methylprednisolone], and Toradol [ketorolac tromethamine]  Review of Systems   Review of Systems  Constitutional: Negative for fever.  HENT: Negative for ear pain.   Eyes: Negative for pain.  Respiratory: Positive for cough, chest tightness and  shortness of breath.   Gastrointestinal: Negative for abdominal pain.  Genitourinary: Negative for flank pain.  Musculoskeletal: Negative for back pain.  Skin: Negative for rash.  Neurological: Negative for headaches.    Physical Exam Updated Vital Signs BP 124/78   Pulse (!) 110   Temp 98.7 F (37.1 C) (Oral)   Resp 18   Wt 136.1 kg   LMP 04/10/2021   SpO2 100%   BMI 53.16 kg/m   Physical Exam Constitutional:      General: She is not in acute distress.    Appearance: Normal appearance.  HENT:     Head: Normocephalic.     Nose: Nose normal.  Eyes:     Extraocular Movements: Extraocular movements intact.  Cardiovascular:     Rate and Rhythm: Normal rate.  Pulmonary:     Effort: Tachypnea present.     Breath sounds: Wheezing present.  Musculoskeletal:        General: Normal range of motion.     Cervical back: Normal range of motion.  Neurological:     General: No focal deficit present.     Mental Status: She is alert. Mental status is at baseline.     ED Results / Procedures / Treatments   Labs (all labs ordered are listed, but only abnormal results are displayed) Labs Reviewed  CBC WITH DIFFERENTIAL/PLATELET - Abnormal; Notable for the following components:      Result Value   RBC 3.75 (*)    Hemoglobin 10.3 (*)    HCT 34.0 (*)    RDW 16.2 (*)    All other components within normal limits  BASIC METABOLIC PANEL - Abnormal; Notable for the  following components:   Potassium 2.9 (*)    Glucose, Bld 135 (*)    Calcium 8.4 (*)    All other components within normal limits  PROTIME-INR  TROPONIN I (HIGH SENSITIVITY)    EKG None  Radiology DG Chest Port 1 View  Result Date: 04/19/2021 CLINICAL DATA:  Shortness of breath EXAM: PORTABLE CHEST 1 VIEW COMPARISON:  Chest radiograph dated 04/09/2021. FINDINGS: The heart size and mediastinal contours are within normal limits. Both lungs are clear. The visualized skeletal structures are unremarkable. IMPRESSION: No active disease. Electronically Signed   By: Romona Curlsyler  Litton M.D.   On: 04/19/2021 17:10    Procedures Procedures   Medications Ordered in ED Medications  ipratropium-albuterol (DUONEB) 0.5-2.5 (3) MG/3ML nebulizer solution 3 mL (3 mLs Nebulization Not Given 04/19/21 1659)  warfarin (COUMADIN) tablet 5 mg (has no administration in time range)  enoxaparin (LOVENOX) injection 200 mg (has no administration in time range)  sodium chloride 0.9 % bolus 1,000 mL (0 mLs Intravenous Stopped 04/19/21 1856)  potassium chloride SA (KLOR-CON) CR tablet 40 mEq (40 mEq Oral Given 04/19/21 1815)  potassium chloride 10 mEq in 100 mL IVPB (0 mEq Intravenous Stopped 04/19/21 1856)    ED Course  I have reviewed the triage vital signs and the nursing notes.  Pertinent labs & imaging results that were available during my care of the patient were reviewed by me and considered in my medical decision making (see chart for details).    MDM Rules/Calculators/A&P                          Patient has mild wheezes but O2 saturation 96 to 98% on room air which is appropriate.  She arrives tachycardic, I suspect partly due to albuterol treatment she received  in the EMS vehicle.  INR subtherapeutic, patient given Coumadin dosing again and Lovenox as levels are too low and may require repeat bridging.  Will be discharged home, advised follow-up with her doctor again in 2 to 3 days.  Patient had multiple  questions about persistent symptoms after COVID which were discussed with her.  Recommending finger pulse oximeter and return immediately to the ER if she has worsening symptoms pain low oxygenation or any additional concerns.  Final Clinical Impression(s) / ED Diagnoses Final diagnoses:  Moderate persistent asthma with exacerbation  Other chronic pulmonary embolism, unspecified whether acute cor pulmonale present [facility])    Rx / DC Orders ED Discharge Orders         Ordered    enoxaparin (LOVENOX) 100 mg/mL SOLN injection  Daily        04/19/21 1945           Cheryll Cockayne, MD 04/19/21 1945

## 2021-04-19 NOTE — ED Triage Notes (Addendum)
EMS reports from home, Asthma exacerbation and cough c/o chest pain, Hx of COPD also. Pt took 3 treatments of rescue inhaler and stated took 800mg  Tylenol at home without result. EMS gave 10mg  Albuterol neb enroute and 324mg  ASA.   BP 129/100 HR 120 RR 26 Sp02 100 15 ltr Neb Temp 97.2  20ga RAC

## 2021-04-19 NOTE — Discharge Instructions (Addendum)
You will need to take Coumadin and Lovenox.  Lovenox will only be for 5 days total.  Follow-up with your doctor in Coumadin clinic within the week.  Return if you have worsening symptoms, difficulty breathing, pain or any additional concerns.

## 2021-04-27 ENCOUNTER — Emergency Department (HOSPITAL_BASED_OUTPATIENT_CLINIC_OR_DEPARTMENT_OTHER)
Admission: EM | Admit: 2021-04-27 | Discharge: 2021-04-28 | Disposition: A | Discharge status: Home / Self Care | Payer: Medicaid Other | Attending: Emergency Medicine | Admitting: Emergency Medicine

## 2021-04-27 ENCOUNTER — Other Ambulatory Visit: Payer: Self-pay

## 2021-04-27 DIAGNOSIS — Z7982 Long term (current) use of aspirin: Secondary | ICD-10-CM | POA: Insufficient documentation

## 2021-04-27 DIAGNOSIS — Z7951 Long term (current) use of inhaled steroids: Secondary | ICD-10-CM | POA: Insufficient documentation

## 2021-04-27 DIAGNOSIS — Z8616 Personal history of COVID-19: Secondary | ICD-10-CM | POA: Diagnosis not present

## 2021-04-27 DIAGNOSIS — E1122 Type 2 diabetes mellitus with diabetic chronic kidney disease: Secondary | ICD-10-CM | POA: Insufficient documentation

## 2021-04-27 DIAGNOSIS — R1013 Epigastric pain: Secondary | ICD-10-CM | POA: Insufficient documentation

## 2021-04-27 DIAGNOSIS — I129 Hypertensive chronic kidney disease with stage 1 through stage 4 chronic kidney disease, or unspecified chronic kidney disease: Secondary | ICD-10-CM | POA: Diagnosis not present

## 2021-04-27 DIAGNOSIS — Z7901 Long term (current) use of anticoagulants: Secondary | ICD-10-CM | POA: Diagnosis not present

## 2021-04-27 DIAGNOSIS — R197 Diarrhea, unspecified: Secondary | ICD-10-CM | POA: Diagnosis not present

## 2021-04-27 DIAGNOSIS — N189 Chronic kidney disease, unspecified: Secondary | ICD-10-CM | POA: Insufficient documentation

## 2021-04-27 DIAGNOSIS — J45909 Unspecified asthma, uncomplicated: Secondary | ICD-10-CM | POA: Diagnosis not present

## 2021-04-28 ENCOUNTER — Encounter (HOSPITAL_BASED_OUTPATIENT_CLINIC_OR_DEPARTMENT_OTHER): Payer: Self-pay | Admitting: *Deleted

## 2021-04-28 ENCOUNTER — Other Ambulatory Visit: Payer: Self-pay

## 2021-04-28 ENCOUNTER — Emergency Department (HOSPITAL_BASED_OUTPATIENT_CLINIC_OR_DEPARTMENT_OTHER): Payer: Medicaid Other

## 2021-04-28 LAB — CBC WITH DIFFERENTIAL/PLATELET
Abs Immature Granulocytes: 0.05 10*3/uL (ref 0.00–0.07)
Basophils Absolute: 0 10*3/uL (ref 0.0–0.1)
Basophils Relative: 1 %
Eosinophils Absolute: 0 10*3/uL (ref 0.0–0.5)
Eosinophils Relative: 1 %
HCT: 37.2 % (ref 36.0–46.0)
Hemoglobin: 11 g/dL — ABNORMAL LOW (ref 12.0–15.0)
Immature Granulocytes: 1 %
Lymphocytes Relative: 42 %
Lymphs Abs: 3.6 10*3/uL (ref 0.7–4.0)
MCH: 26.8 pg (ref 26.0–34.0)
MCHC: 29.6 g/dL — ABNORMAL LOW (ref 30.0–36.0)
MCV: 90.5 fL (ref 80.0–100.0)
Monocytes Absolute: 0.4 10*3/uL (ref 0.1–1.0)
Monocytes Relative: 5 %
Neutro Abs: 4.4 10*3/uL (ref 1.7–7.7)
Neutrophils Relative %: 50 %
Platelets: 408 10*3/uL — ABNORMAL HIGH (ref 150–400)
RBC: 4.11 MIL/uL (ref 3.87–5.11)
RDW: 15.8 % — ABNORMAL HIGH (ref 11.5–15.5)
WBC: 8.5 10*3/uL (ref 4.0–10.5)
nRBC: 0 % (ref 0.0–0.2)

## 2021-04-28 LAB — COMPREHENSIVE METABOLIC PANEL
ALT: 26 U/L (ref 0–44)
AST: 18 U/L (ref 15–41)
Albumin: 3.9 g/dL (ref 3.5–5.0)
Alkaline Phosphatase: 76 U/L (ref 38–126)
Anion gap: 8 (ref 5–15)
BUN: 11 mg/dL (ref 6–20)
CO2: 30 mmol/L (ref 22–32)
Calcium: 9.5 mg/dL (ref 8.9–10.3)
Chloride: 105 mmol/L (ref 98–111)
Creatinine, Ser: 0.82 mg/dL (ref 0.44–1.00)
GFR, Estimated: >60 mL/min (ref >60)
Glucose, Bld: 114 mg/dL — ABNORMAL HIGH (ref 70–99)
Potassium: 3.6 mmol/L (ref 3.5–5.1)
Sodium: 143 mmol/L (ref 135–145)
Total Bilirubin: 0.1 mg/dL — ABNORMAL LOW (ref 0.3–1.2)
Total Protein: 6.8 g/dL (ref 6.5–8.1)

## 2021-04-28 LAB — URINALYSIS, ROUTINE W REFLEX MICROSCOPIC
Bilirubin Urine: NEGATIVE
Glucose, UA: NEGATIVE mg/dL
Hgb urine dipstick: NEGATIVE
Ketones, ur: NEGATIVE mg/dL
Nitrite: NEGATIVE
Protein, ur: NEGATIVE mg/dL
Specific Gravity, Urine: 1.024 (ref 1.005–1.030)
pH: 6 (ref 5.0–8.0)

## 2021-04-28 LAB — LIPASE, BLOOD: Lipase: 26 U/L (ref 11–51)

## 2021-04-28 LAB — PREGNANCY, URINE: Preg Test, Ur: NEGATIVE

## 2021-04-28 IMAGING — CT CT ABD-PELV W/O CM
2 of 4 series · 17 of 46 positions shown, 19 images · non-contrast
Comparison: None.

CLINICAL DATA: Epigastric abdominal pain

EXAM:
CT ABDOMEN AND PELVIS WITHOUT CONTRAST
TECHNIQUE: Multidetector CT imaging of the abdomen and pelvis was performed
following the standard protocol without IV contrast.

[Series 2: abd pel wo · axial · 0.74mm/px · z∈[+776,+1196]mm · 14 of 92 slices shown, 16 images]
[im 4/92  soft-tissue]
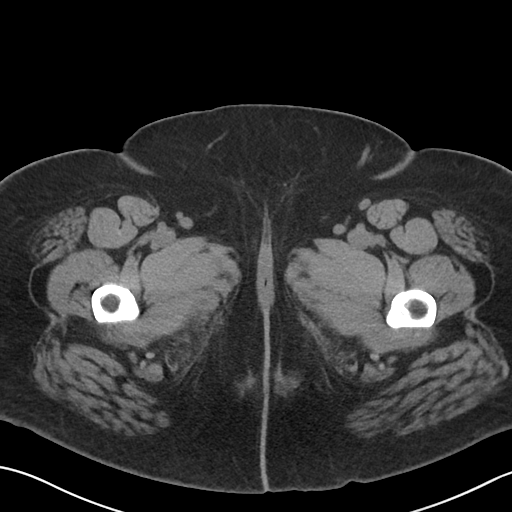
[im 4/92  bone]
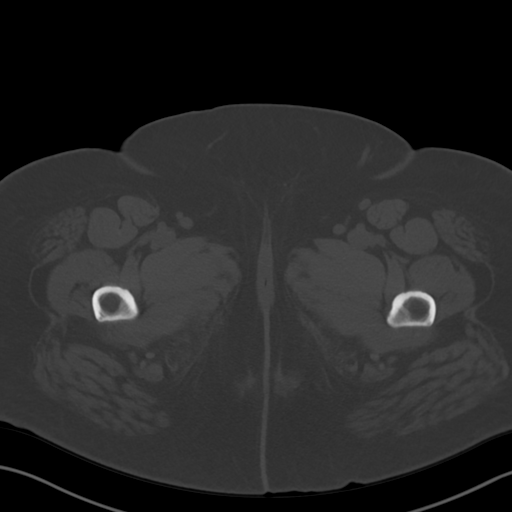
[im 11/92  soft-tissue]
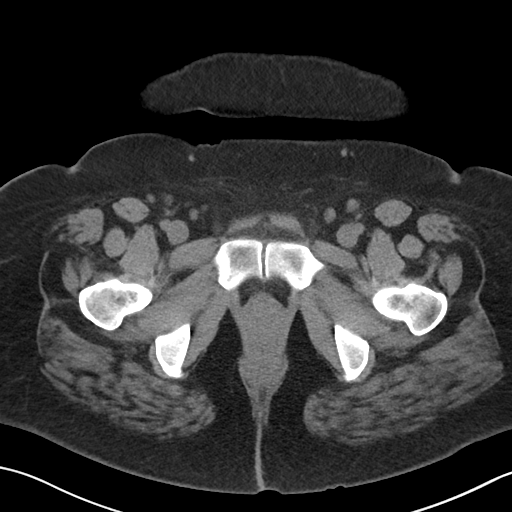
[im 18/92  soft-tissue]
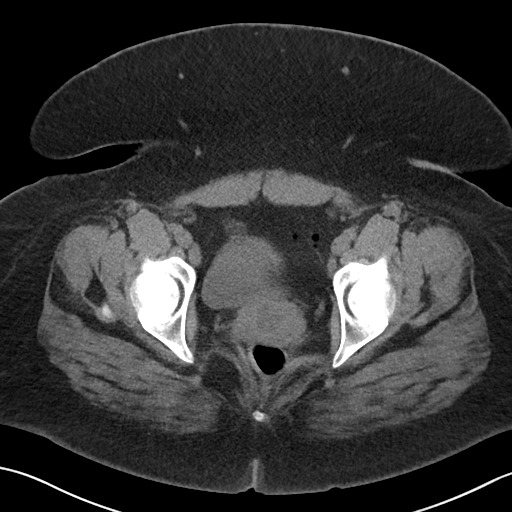
[im 25/92  soft-tissue]
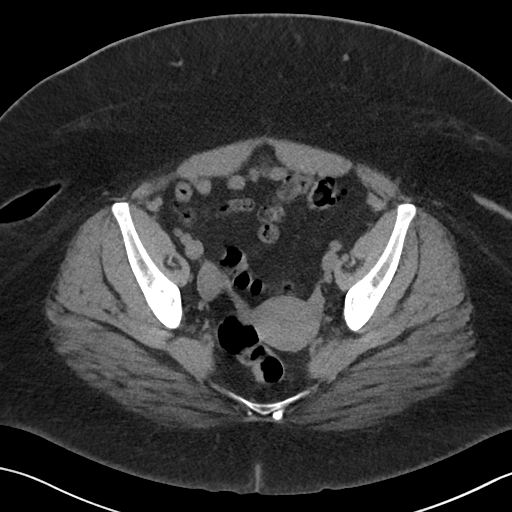
[im 32/92  soft-tissue]
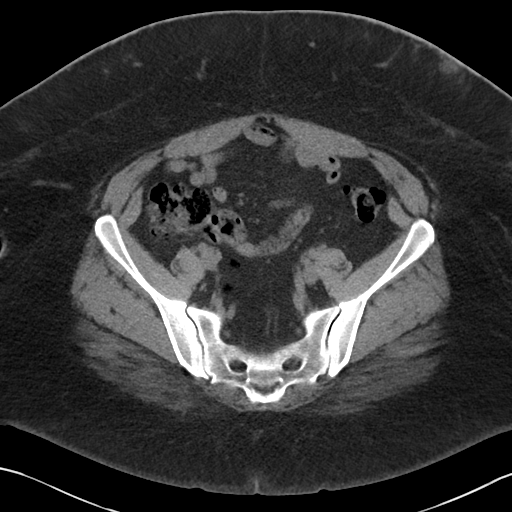
[im 36/92  soft-tissue]
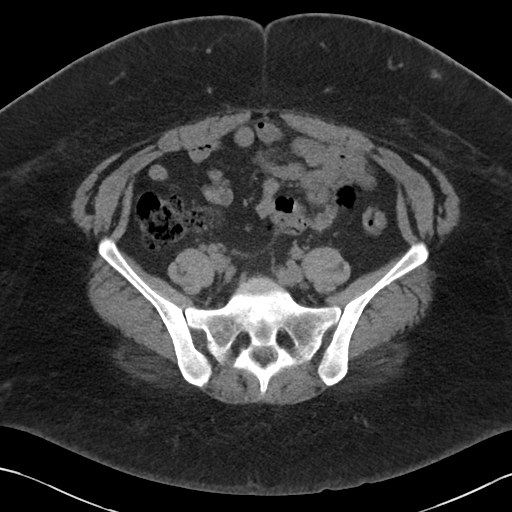
[im 43/92  soft-tissue]
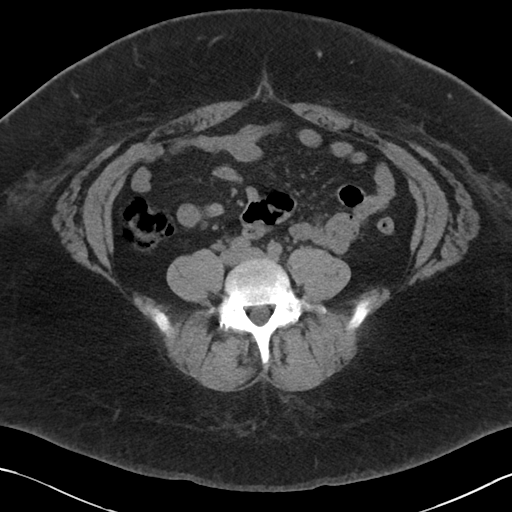
[im 50/92  soft-tissue]
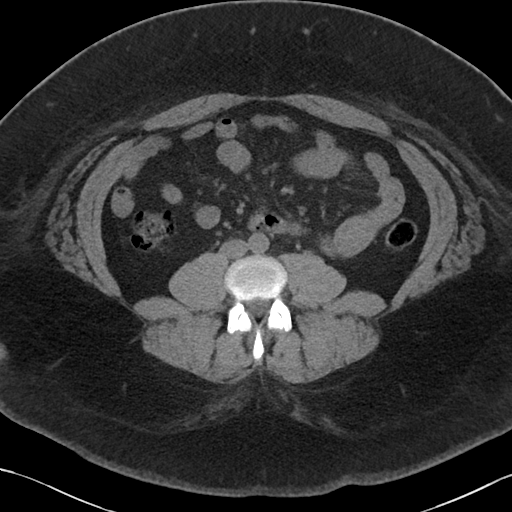
[im 57/92  soft-tissue]
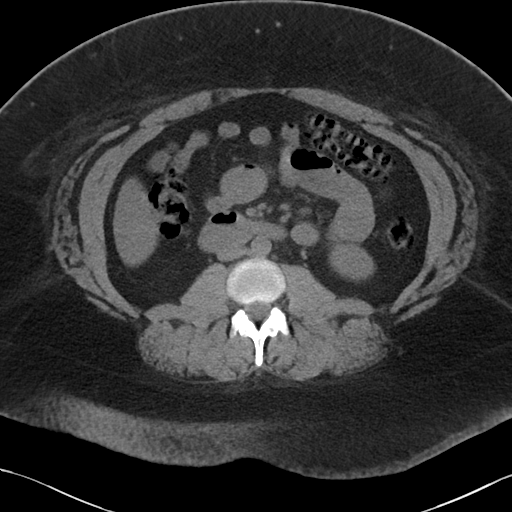
[im 57/92  bone]
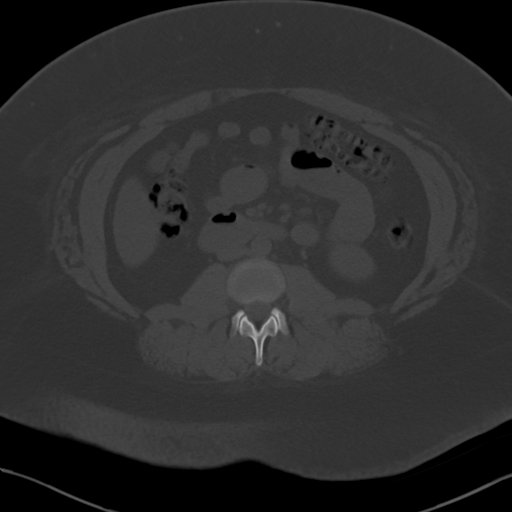
[im 60/92  soft-tissue]
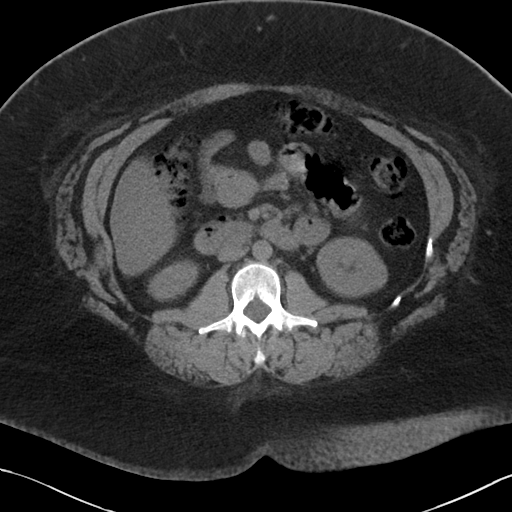
[im 67/92  soft-tissue]
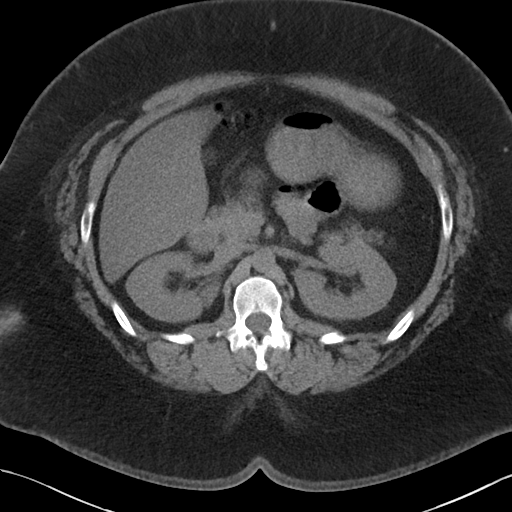
[im 74/92  soft-tissue]
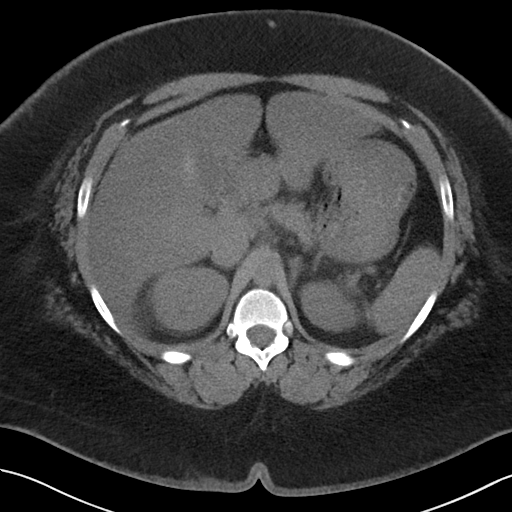
[im 81/92  soft-tissue]
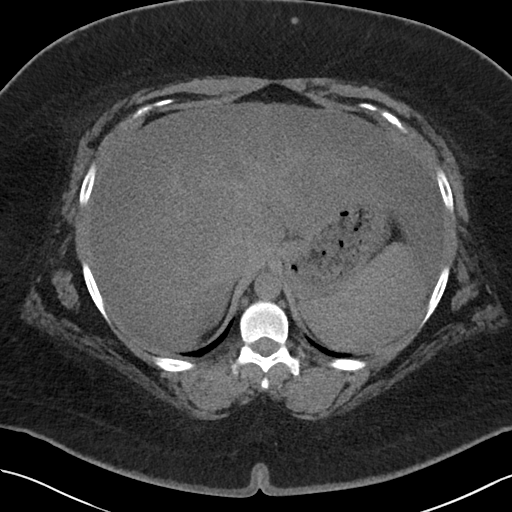
[im 88/92  soft-tissue]
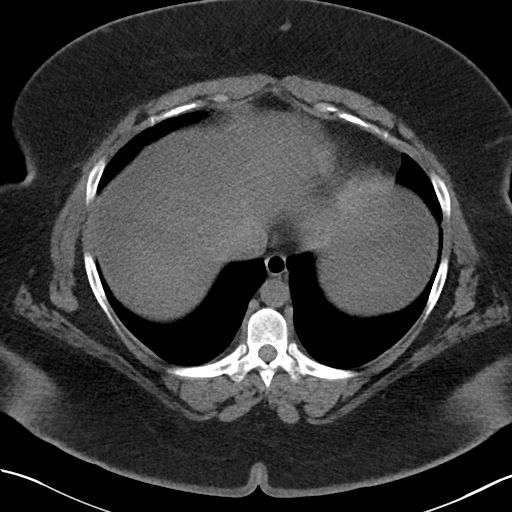

[Series 5: coronal · coronal · 0.89mm/px · 3 of 111 slices shown]
[im 37/111  soft-tissue]
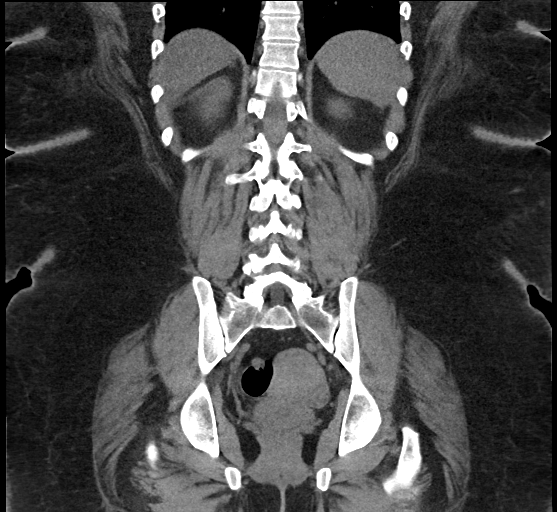
[im 49/111  soft-tissue]
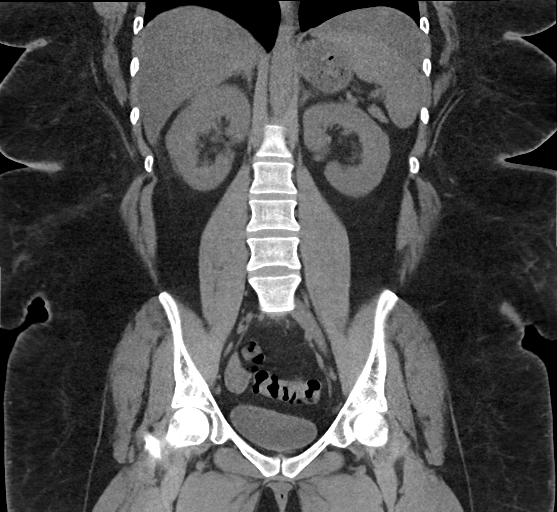
[im 62/111  soft-tissue]
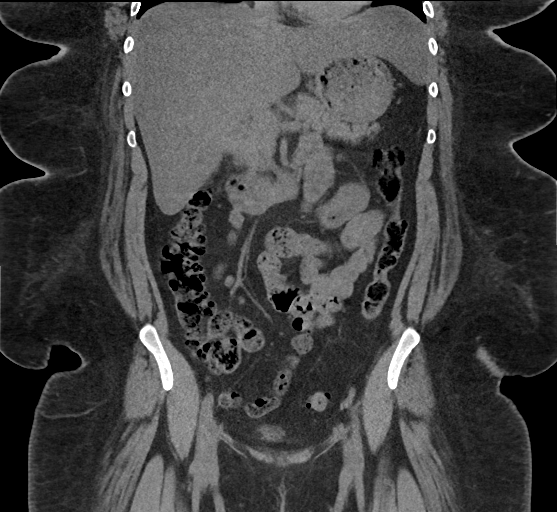

[17 of 46 positions shown; findings below may reference images not displayed]

FINDINGS: Lower chest: No acute abnormality.

Hepatobiliary: Moderate hepatic steatosis. Mild hepatomegaly. No
definite focal intrahepatic mass identified. No intra or
extrahepatic biliary ductal dilation. Gallbladder unremarkable.

Pancreas: Unremarkable

Spleen: Unremarkable

Adrenals/Urinary Tract: Adrenal glands are unremarkable. Kidneys are
normal, without renal calculi, focal lesion, or hydronephrosis.
Bladder is unremarkable.

Stomach/Bowel: Moderate sigmoid diverticulosis. The stomach, small
bowel, and large bowel are otherwise unremarkable. Appendix normal.
No free intraperitoneal gas or fluid.

Vascular/Lymphatic: No significant vascular findings are present. No
enlarged abdominal or pelvic lymph nodes.

Reproductive: Uterus and bilateral adnexa are unremarkable.

Other: No abdominal wall hernia.  Rectum unremarkable.

Musculoskeletal: No acute bone abnormality. No lytic or blastic bone
lesion identified.
IMPRESSION: No acute intra-abdominal pathology identified. No definite
radiographic explanation for the patient's reported symptoms.

Moderate hepatic steatosis.  Mild hepatomegaly.

Moderate sigmoid diverticulosis.

## 2021-04-28 MED ORDER — OMEPRAZOLE 20 MG PO CPDR: 20.0000 mg | DELAYED_RELEASE_CAPSULE | Freq: Every day | ORAL | 0 refills | Status: DC

## 2021-04-28 MED ORDER — MORPHINE SULFATE (PF) 4 MG/ML IV SOLN
4.0000 mg | Freq: Once | INTRAVENOUS | Status: AC
  Administered 2021-04-28: 4 mg via INTRAVENOUS
  Filled 2021-04-28: qty 1

## 2021-04-28 MED ORDER — ALUM & MAG HYDROXIDE-SIMETH 200-200-20 MG/5ML PO SUSP
30.0000 mL | Freq: Once | ORAL | Status: AC
  Administered 2021-04-28: 30 mL via ORAL
  Filled 2021-04-28: qty 30

## 2021-04-28 MED ORDER — LIDOCAINE VISCOUS HCL 2 % MT SOLN
15.0000 mL | Freq: Once | OROMUCOSAL | Status: AC
  Administered 2021-04-28: 15 mL via ORAL
  Filled 2021-04-28: qty 15

## 2021-04-28 MED ORDER — PANTOPRAZOLE SODIUM 40 MG PO TBEC
40.0000 mg | DELAYED_RELEASE_TABLET | Freq: Once | ORAL | Status: AC
  Administered 2021-04-28: 40 mg via ORAL
  Filled 2021-04-28: qty 1

## 2021-04-28 MED ORDER — ONDANSETRON HCL 4 MG/2ML IJ SOLN
4.0000 mg | Freq: Once | INTRAMUSCULAR | Status: AC
  Administered 2021-04-28: 4 mg via INTRAVENOUS
  Filled 2021-04-28: qty 2

## 2021-04-28 NOTE — Discharge Instructions (Signed)
Seen today for abdominal pain.  Your imaging and labs are reassuring.  With your history of peptic ulcer, this could be gastritis or ulcer.  Start omeprazole daily.  Follow-up with your primary physician.  You may need endoscopy by gastroenterology.

## 2021-04-28 NOTE — ED Notes (Signed)
Pt verbalizes understanding of discharge instructions. Opportunity for questioning and answers were provided. Armand removed by staff, pt discharged from ED to home. Pt understands to follow up with PCP.  

## 2021-04-28 NOTE — ED Triage Notes (Addendum)
Pt c/o of upper abd pain that started this weekend. States pain radiates into her back and her lower abdomen. Denies any nausea. Denies any hx of kidney stones or gallbladder. C/o diarrhea. Denies any fevers. Pt tearful during triage. Pt states she took percocet at 8pm. Has a ride home. Recent CT done per pt. For flank pain. Pt with a hx of PEs . Denies sob.

## 2021-04-28 NOTE — ED Notes (Signed)
Patient transported to CT 

## 2021-04-28 NOTE — ED Provider Notes (Signed)
[facility] EMERGENCY DEPT Provider Note   CSN: [id number] Arrival date & time: 04/27/21  2351     History Chief Complaint  Patient presents with  . Abdominal Pain    Felicia Bradley is a 30 y.o. female.  HPI     This a 29 year old female with a history of DVT and PE, obesity, hypertension, diabetes, chronic pain on chronic pain medication who presents with abdominal pain.  Patient reports 1 week history of epigastric abdominal pain.  It radiates to the right upper and lower abdomen and back.  Does not seem to be alleviated or worsened by anything specific.  No association with food.  Denies nausea or vomiting.  She does report some diarrhea.  No known sick contacts.  She does report recent COVID diagnosis and "long symptoms."  She took a Percocet at home with no relief of pain.  She reports taking 1 additional Tylenol; however, states that she is aware that he needs to monitor daily Tylenol intake and denies any further Tylenol usage.  Denies urinary symptoms.  Denies chest pain, shortness of breath.  Patient seen and evaluated on May 1 for some flank pain.  She had a noncontrast CT scan at that time which was largely reassuring.  She states her symptoms at that time were different.  Past Medical History:  Diagnosis Date  . Asthma   . Bipolar disorder (HCC)   . Diabetes mellitus without complication (HCC)    type 2  . DVT (deep venous thrombosis) (HCC) 2015   w/ recannulation per Care Everywhere  . Hypertension   . Obesity   . Pulmonary embolism (HCC) 2015    Patient Active Problem List   Diagnosis Date Noted  . History of DVT (deep vein thrombosis) 03/23/2021  . History of pulmonary embolus (PE) 03/23/2021  . PTSD (post-traumatic stress disorder) 03/23/2021  . Bipolar 1 disorder (HCC) 03/23/2021  . BMI 50.0-59.9, adult (HCC) 03/23/2021  . SIRS (systemic inflammatory response syndrome) (HCC) 03/19/2021  . Chest pain 03/19/2021  . Emesis 03/19/2021  .  Malingering 01/22/2020  . Acute respiratory disease due to COVID-19 virus 01/18/2020  . Asthma 01/18/2020  . Asthma exacerbation 01/14/2020  . Type 2 DM with CKD and hypertension (HCC) 01/14/2020  . OSA (obstructive sleep apnea) 01/14/2020  . Suspected COVID-19 virus infection     Past Surgical History:  Procedure Laterality Date  . CESAREAN SECTION       OB History    Gravida  3   Para      Term      Preterm      AB  2   Living  0     SAB  1   IAB  1   Ectopic      Multiple      Live Births              Family History  Problem Relation Age of Onset  . Diabetes Mother   . Asthma Mother   . Asthma Maternal Aunt   . Diabetes Maternal Aunt     Social History   Tobacco Use  . Smoking status: Never Smoker  . Smokeless tobacco: Never Used  Substance Use Topics  . Alcohol use: No  . Drug use: No    Home Medications Prior to Admission medications   Medication Sig Start Date End Date Taking? Authorizing Provider  omeprazole (PRILOSEC) 20 MG capsule Take 1 capsule (20 mg total) by mouth daily. 04/28/21  Yes  Uzair Godley, Mayer Masker, MD  acetaminophen (TYLENOL) 500 MG tablet Take 500 mg by mouth every 6 (six) hours as needed for mild pain.    [provider]  albuterol (VENTOLIN HFA) 108 (90 Base) MCG/ACT inhaler Inhale 1 puff into the lungs 2 (two) times daily as needed for wheezing or shortness of breath.  11/10/14   [provider]  ALPRAZolam Prudy Feeler) 0.5 MG tablet Take 0.5 mg by mouth 2 (two) times daily. 01/23/21   [provider]  ARIPiprazole (ABILIFY) 2 MG tablet Take 2 mg by mouth daily.    [provider]  aspirin EC 81 MG tablet Take 81 mg by mouth daily. Swallow whole.    [provider]  brexpiprazole (REXULTI) 2 MG TABS tablet Take 2 mg by mouth daily.    [provider]  budesonide-formoterol (SYMBICORT) 160-4.5 MCG/ACT inhaler Inhale 2 puffs into the lungs 2 (two) times daily. 03/27/21   [place],  Eric J, DO  busPIRone (BUSPAR) 7.5 MG tablet Take 7.5 mg by mouth in the morning and at bedtime.    [provider]  celecoxib (CELEBREX) 100 MG capsule Take 100 mg by mouth 2 (two) times daily as needed for pain. 02/24/21   [provider]  cyclobenzaprine (FLEXERIL) 10 MG tablet Take 10 mg by mouth 3 (three) times daily as needed for muscle spasms.    [provider]  diclofenac Sodium (VOLTAREN) 1 % GEL Apply 2 g topically 4 (four) times daily. Patient taking differently: Apply 2 g topically 2 (two) times daily as needed (pain). 06/28/20   Hall-Potvin, [place], PA-C  enoxaparin (LOVENOX) 100 mg/mL SOLN injection Inject 2.05 mLs (205 mg total) into the skin daily for 4 days. 04/19/21 04/23/21  Cheryll Cockayne, MD  EPINEPHrine 0.3 mg/0.3 mL IJ SOAJ injection Inject 0.3 mg into the muscle as needed for anaphylaxis.  11/10/14   [provider]  ferrous sulfate 325 (65 FE) MG EC tablet Take 325 mg by mouth 2 (two) times daily with a meal. 11/10/14   [provider]  folic acid (FOLVITE) 1 MG tablet Take 1 tablet (1 mg total) by mouth daily. 03/22/21   Marguerita Merles Latif, DO  gabapentin (NEURONTIN) 300 MG capsule Take 300 mg by mouth 3 (three) times daily.    [provider]  guaiFENesin-dextromethorphan (ROBITUSSIN DM) 100-10 MG/5ML syrup Take 5 mLs by mouth every 4 (four) hours as needed for cough. 03/27/21   [place], Eric J, DO  ipratropium-albuterol (DUONEB) 0.5-2.5 (3) MG/3ML SOLN Take 3 mLs by nebulization in the morning, at noon, and at bedtime. 07/19/19   [provider]  LATUDA 20 MG TABS tablet Take 20 mg by mouth daily. 11/19/20   [provider]  Multiple Vitamin (MULTIVITAMIN WITH MINERALS) TABS tablet Take 1 tablet by mouth daily.    [provider]  ondansetron (ZOFRAN) 4 MG tablet Take 1 tablet (4 mg total) by mouth every 6 (six) hours. 03/30/21   Couture, Cortni S, PA-C  oxyCODONE-acetaminophen (PERCOCET) 10-325 MG  tablet Take 1 tablet by mouth 3 (three) times daily as needed for severe pain. 02/24/21   [provider]  senna-docusate (SENOKOT-S) 8.6-50 MG tablet Take 1 tablet by mouth at bedtime. 03/21/21   Marguerita Merles Latif, DO  sertraline (ZOLOFT) 50 MG tablet Take 50 mg by mouth daily. 01/13/21   [provider]  sucralfate (CARAFATE) 1 GM/10ML suspension Take 1 g by mouth 2 (two) times daily before a meal.  [provider]  traZODone (DESYREL) 100 MG tablet Take 200 mg by mouth at bedtime. 11/19/20   [provider]  warfarin (COUMADIN) 2 MG tablet Take 2 mg by mouth daily.    [provider]    Allergies    Contrast media [iodinated diagnostic agents], Dilaudid [hydromorphone], Tramadol, Apple, Banana, Coconut oil, Dexamethasone, Other, Solu-medrol [methylprednisolone], and Toradol [ketorolac tromethamine]  Review of Systems   Review of Systems  Constitutional: Negative for fever.  Respiratory: Negative for shortness of breath.   Cardiovascular: Negative for chest pain.  Gastrointestinal: Positive for abdominal pain and diarrhea. Negative for blood in stool, nausea and vomiting.  Genitourinary: Negative for dysuria and flank pain.  All other systems reviewed and are negative.   Physical Exam Updated Vital Signs BP (!) 121/98   Pulse (!) 101   Temp 100.1 F (37.8 C) (Oral)   Resp 19   Ht 1.6 m (5\' 3" )   Wt 129.3 kg   LMP 04/10/2021   SpO2 99%   BMI 50.49 kg/m   Physical Exam Vitals and nursing note reviewed.  Constitutional:      Appearance: She is well-developed.     Comments: Morbidly obese, nontoxic-appearing  HENT:     Head: Normocephalic and atraumatic.     Mouth/Throat:     Mouth: Mucous membranes are moist.  Eyes:     Pupils: Pupils are equal, round, and reactive to light.  Cardiovascular:     Rate and Rhythm: Normal rate and regular rhythm.     Heart sounds: Normal heart sounds.  Pulmonary:     Effort: Pulmonary effort is  normal. No respiratory distress.     Breath sounds: No wheezing.  Abdominal:     General: Bowel sounds are normal.     Palpations: Abdomen is soft.     Tenderness: There is abdominal tenderness in the right lower quadrant and epigastric area. There is no guarding or rebound.  Musculoskeletal:     Cervical back: Neck supple.  Skin:    General: Skin is warm and dry.  Neurological:     Mental Status: She is alert and oriented to person, place, and time.  Psychiatric:        Mood and Affect: Mood normal.     ED Results / Procedures / Treatments   Labs (all labs ordered are listed, but only abnormal results are displayed) Labs Reviewed  URINALYSIS, ROUTINE W REFLEX MICROSCOPIC - Abnormal; Notable for the following components:      Result Value   Leukocytes,Ua TRACE (*)    Non Squamous Epithelial 0-5 (*)    All other components within normal limits  CBC WITH DIFFERENTIAL/PLATELET - Abnormal; Notable for the following components:   Hemoglobin 11.0 (*)    MCHC 29.6 (*)    RDW 15.8 (*)    Platelets 408 (*)    All other components within normal limits  COMPREHENSIVE METABOLIC PANEL - Abnormal; Notable for the following components:   Glucose, Bld 114 (*)    Total Bilirubin 0.1 (*)    All other components within normal limits  PREGNANCY, URINE  LIPASE, BLOOD    EKG None  Radiology CT ABDOMEN PELVIS WO CONTRAST  Result Date: 04/28/2021 CLINICAL DATA:  Epigastric abdominal pain EXAM: CT ABDOMEN AND PELVIS WITHOUT CONTRAST TECHNIQUE: Multidetector CT imaging of the abdomen and pelvis was performed following the standard protocol without IV contrast. COMPARISON:  None. FINDINGS: Lower chest: No acute abnormality. Hepatobiliary: Moderate hepatic steatosis. Mild hepatomegaly. No  definite focal intrahepatic mass identified. No intra or extrahepatic biliary ductal dilation. Gallbladder unremarkable. Pancreas: Unremarkable Spleen: Unremarkable Adrenals/Urinary Tract: Adrenal glands are  unremarkable. Kidneys are normal, without renal calculi, focal lesion, or hydronephrosis. Bladder is unremarkable. Stomach/Bowel: Moderate sigmoid diverticulosis. The stomach, small bowel, and large bowel are otherwise unremarkable. Appendix normal. No free intraperitoneal gas or fluid. Vascular/Lymphatic: No significant vascular findings are present. No enlarged abdominal or pelvic lymph nodes. Reproductive: Uterus and bilateral adnexa are unremarkable. Other: No abdominal wall hernia.  Rectum unremarkable. Musculoskeletal: No acute bone abnormality. No lytic or blastic bone lesion identified. IMPRESSION: No acute intra-abdominal pathology identified. No definite radiographic explanation for the patient's reported symptoms. Moderate hepatic steatosis.  Mild hepatomegaly. Moderate sigmoid diverticulosis. Electronically Signed   By: Helyn Numbers MD   On: 04/28/2021 04:17    Procedures Procedures   Medications Ordered in ED Medications  morphine 4 MG/ML injection 4 mg (4 mg Intravenous Given 04/28/21 0044)  ondansetron (ZOFRAN) injection 4 mg (4 mg Intravenous Given 04/28/21 0044)  pantoprazole (PROTONIX) EC tablet 40 mg (40 mg Oral Given 04/28/21 0104)  alum & mag hydroxide-simeth (MAALOX/MYLANTA) 200-200-20 MG/5ML suspension 30 mL (30 mLs Oral Given 04/28/21 0104)    And  lidocaine (XYLOCAINE) 2 % viscous mouth solution 15 mL (15 mLs Oral Given 04/28/21 0104)    ED Course  I have reviewed the triage vital signs and the nursing notes.  Pertinent labs & imaging results that were available during my care of the patient were reviewed by me and considered in my medical decision making (see chart for details).  Clinical Course as of 04/28/21 0428  Mon Apr 28, 2021  8466 CT reviewed.  No acute findings.  Patient did have some relief with a GI cocktail.  Reports a history of an ulcer in the past.  She is unsure what if any medication she may be taking for that.  I have reviewed her medications and do not  see any PPIs listed.  We will start her on a PPI.  Refer back to primary physician and gastroenterology for possible endoscopy. [CH]    Clinical Course User Index [CH] Hasani Diemer, Mayer Masker, MD   MDM Rules/Calculators/A&P                          Patient presents with mostly epigastric pain.  She is overall nontoxic and vital signs notable for temperature of 100.1.  She has epigastric tenderness without rebound or guarding.  Considerations include but not limited to gastritis, ulcer, gallbladder pathology, less likely UTI or kidney stone.  She did not recently have a CT scan that was largely reassuring including no evidence of gallstones.  Labs obtained.  No leukocytosis.  No significant metabolic abnormality.  LFTs and lipase are normal.  No evidence of pancreatitis.  Patient had ongoing discomfort.  For this reason, CT scan was obtained and again shows no obvious acute abnormalities.  She did have some improvement with a GI cocktail which may suggest ulcer versus gastritis.  Recommend starting omeprazole follow-up closely with primary physician and GI for possible endoscopy.  After history, exam, and medical workup I feel the patient has been appropriately medically screened and is safe for discharge home. Pertinent diagnoses were discussed with the patient. Patient was given return precautions.  Final Clinical Impression(s) / ED Diagnoses Final diagnoses:  Epigastric pain    Rx / DC Orders ED Discharge Orders  Ordered    omeprazole (PRILOSEC) 20 MG capsule  Daily        04/28/21 0427           Shon BatonHorton, Aaleigha Bozza F, MD 04/28/21 0430

## 2021-05-19 ENCOUNTER — Other Ambulatory Visit: Payer: Self-pay

## 2021-05-19 ENCOUNTER — Encounter (HOSPITAL_BASED_OUTPATIENT_CLINIC_OR_DEPARTMENT_OTHER): Payer: Self-pay | Admitting: Emergency Medicine

## 2021-05-19 ENCOUNTER — Emergency Department (HOSPITAL_BASED_OUTPATIENT_CLINIC_OR_DEPARTMENT_OTHER)
Admission: EM | Admit: 2021-05-19 | Discharge: 2021-05-19 | Disposition: A | Discharge status: Home / Self Care | Payer: Medicaid Other | Attending: Emergency Medicine | Admitting: Emergency Medicine

## 2021-05-19 ENCOUNTER — Emergency Department (HOSPITAL_BASED_OUTPATIENT_CLINIC_OR_DEPARTMENT_OTHER): Payer: Medicaid Other | Admitting: Radiology

## 2021-05-19 DIAGNOSIS — Z7901 Long term (current) use of anticoagulants: Secondary | ICD-10-CM | POA: Diagnosis not present

## 2021-05-19 DIAGNOSIS — Z20822 Contact with and (suspected) exposure to covid-19: Secondary | ICD-10-CM | POA: Insufficient documentation

## 2021-05-19 DIAGNOSIS — I10 Essential (primary) hypertension: Secondary | ICD-10-CM | POA: Insufficient documentation

## 2021-05-19 DIAGNOSIS — Z7982 Long term (current) use of aspirin: Secondary | ICD-10-CM | POA: Insufficient documentation

## 2021-05-19 DIAGNOSIS — Z7951 Long term (current) use of inhaled steroids: Secondary | ICD-10-CM | POA: Insufficient documentation

## 2021-05-19 DIAGNOSIS — E119 Type 2 diabetes mellitus without complications: Secondary | ICD-10-CM | POA: Diagnosis not present

## 2021-05-19 DIAGNOSIS — R0602 Shortness of breath: Secondary | ICD-10-CM | POA: Diagnosis present

## 2021-05-19 DIAGNOSIS — J45901 Unspecified asthma with (acute) exacerbation: Secondary | ICD-10-CM | POA: Insufficient documentation

## 2021-05-19 DIAGNOSIS — J181 Lobar pneumonia, unspecified organism: Secondary | ICD-10-CM | POA: Diagnosis not present

## 2021-05-19 DIAGNOSIS — J189 Pneumonia, unspecified organism: Secondary | ICD-10-CM

## 2021-05-19 LAB — PROTIME-INR
INR: 1.1 (ref 0.8–1.2)
Prothrombin Time: 13.7 seconds (ref 11.4–15.2)

## 2021-05-19 LAB — CBC WITH DIFFERENTIAL/PLATELET
Abs Immature Granulocytes: 0.03 10*3/uL (ref 0.00–0.07)
Basophils Absolute: 0.1 10*3/uL (ref 0.0–0.1)
Basophils Relative: 1 %
Eosinophils Absolute: 0.1 10*3/uL (ref 0.0–0.5)
Eosinophils Relative: 1 %
HCT: 38.7 % (ref 36.0–46.0)
Hemoglobin: 11.8 g/dL — ABNORMAL LOW (ref 12.0–15.0)
Immature Granulocytes: 0 %
Lymphocytes Relative: 37 %
Lymphs Abs: 3.4 10*3/uL (ref 0.7–4.0)
MCH: 26.6 pg (ref 26.0–34.0)
MCHC: 30.5 g/dL (ref 30.0–36.0)
MCV: 87.2 fL (ref 80.0–100.0)
Monocytes Absolute: 0.5 10*3/uL (ref 0.1–1.0)
Monocytes Relative: 5 %
Neutro Abs: 5.1 10*3/uL (ref 1.7–7.7)
Neutrophils Relative %: 56 %
Platelets: 353 10*3/uL (ref 150–400)
RBC: 4.44 MIL/uL (ref 3.87–5.11)
RDW: 15.7 % — ABNORMAL HIGH (ref 11.5–15.5)
WBC: 9.1 10*3/uL (ref 4.0–10.5)
nRBC: 0 % (ref 0.0–0.2)

## 2021-05-19 LAB — BASIC METABOLIC PANEL
Anion gap: 10 (ref 5–15)
BUN: 7 mg/dL (ref 6–20)
CO2: 27 mmol/L (ref 22–32)
Calcium: 9.4 mg/dL (ref 8.9–10.3)
Chloride: 105 mmol/L (ref 98–111)
Creatinine, Ser: 0.6 mg/dL (ref 0.44–1.00)
GFR, Estimated: >60 mL/min (ref >60)
Glucose, Bld: 83 mg/dL (ref 70–99)
Potassium: 3.6 mmol/L (ref 3.5–5.1)
Sodium: 142 mmol/L (ref 135–145)

## 2021-05-19 LAB — RESP PANEL BY RT-PCR (FLU A&B, COVID) ARPGX2
Influenza A by PCR: NEGATIVE
Influenza B by PCR: NEGATIVE
SARS Coronavirus 2 by RT PCR: NEGATIVE

## 2021-05-19 LAB — GROUP A STREP BY PCR: Group A Strep by PCR: NOT DETECTED

## 2021-05-19 IMAGING — DX DG CHEST 2V
2 series · 2 of 2 positions shown · non-contrast
Comparison: [DATE]

CLINICAL DATA: Shortness of breath with cough

EXAM:
CHEST - 2 VIEW

[chest pa]
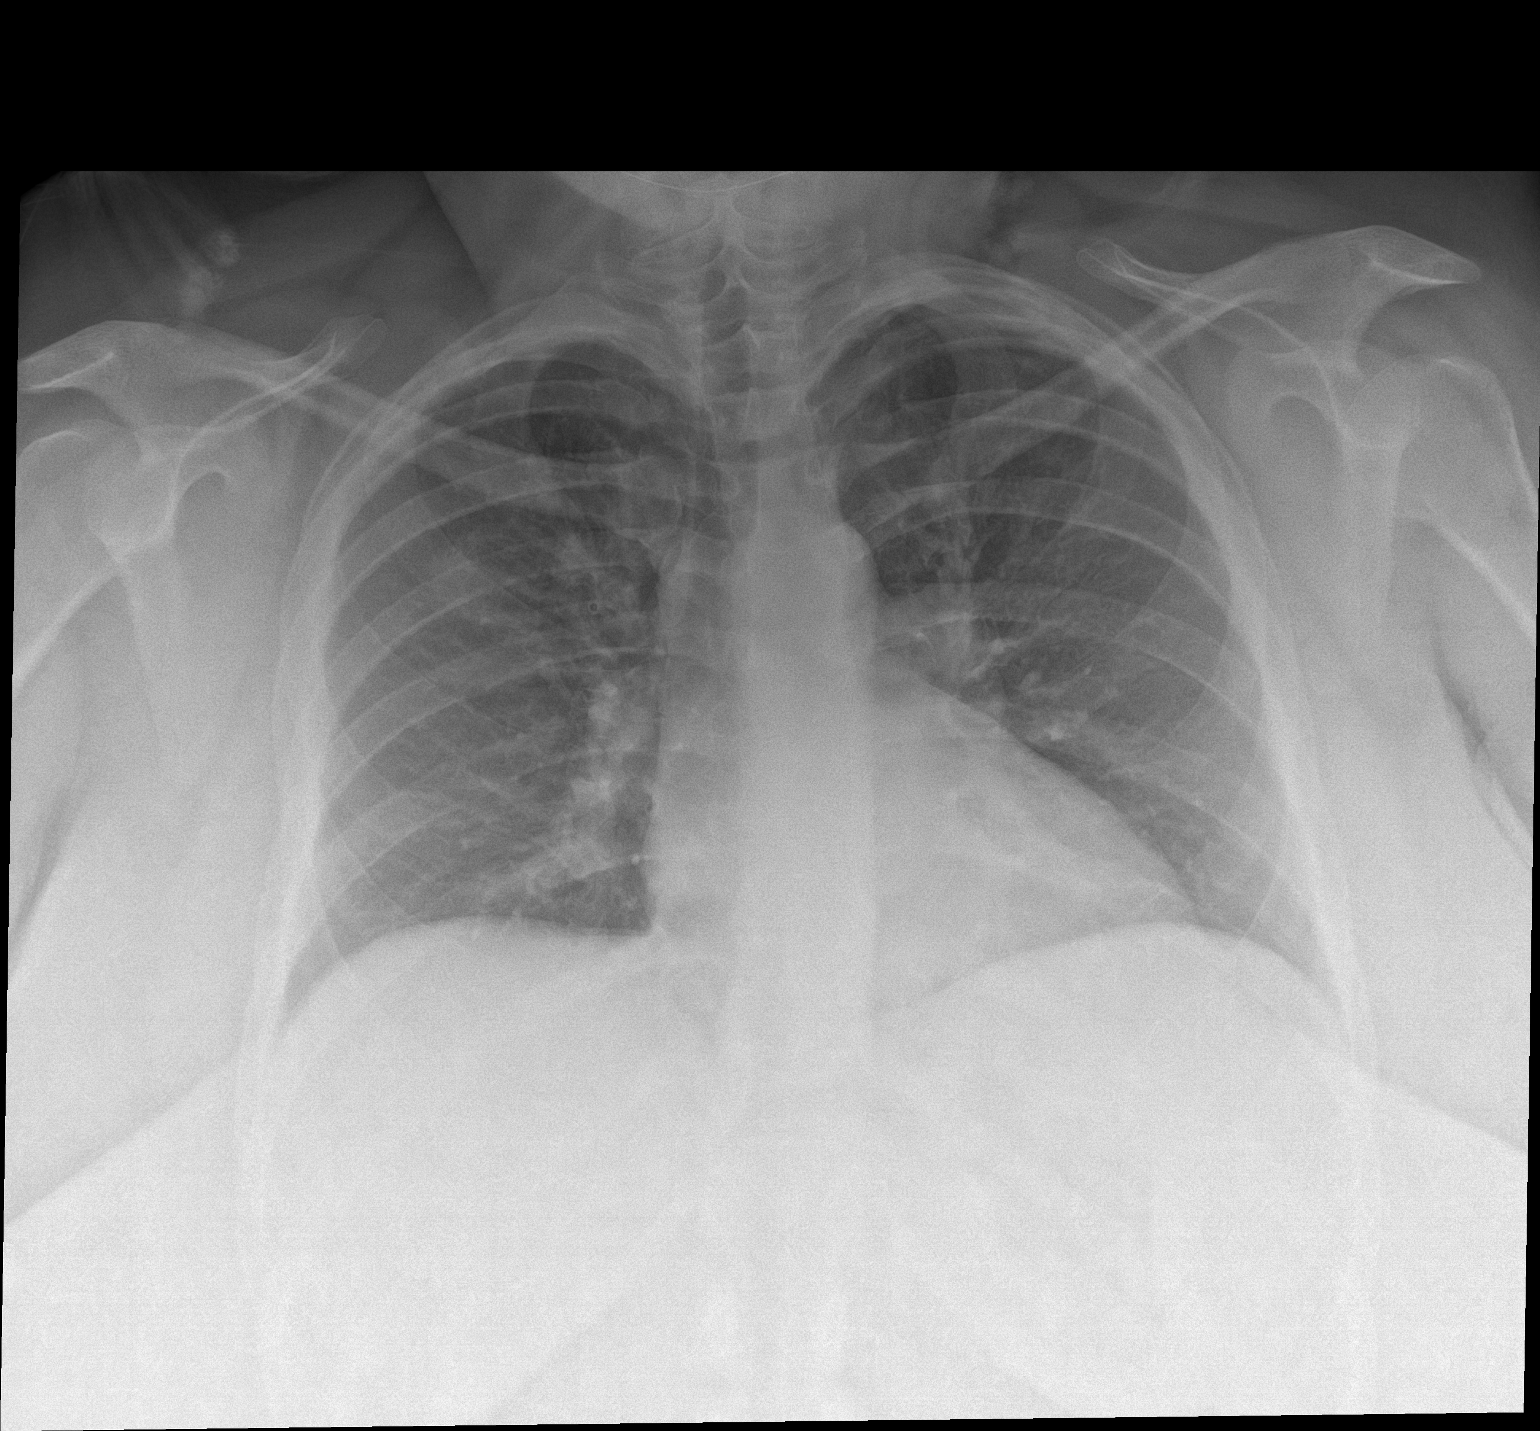

[chest lat]
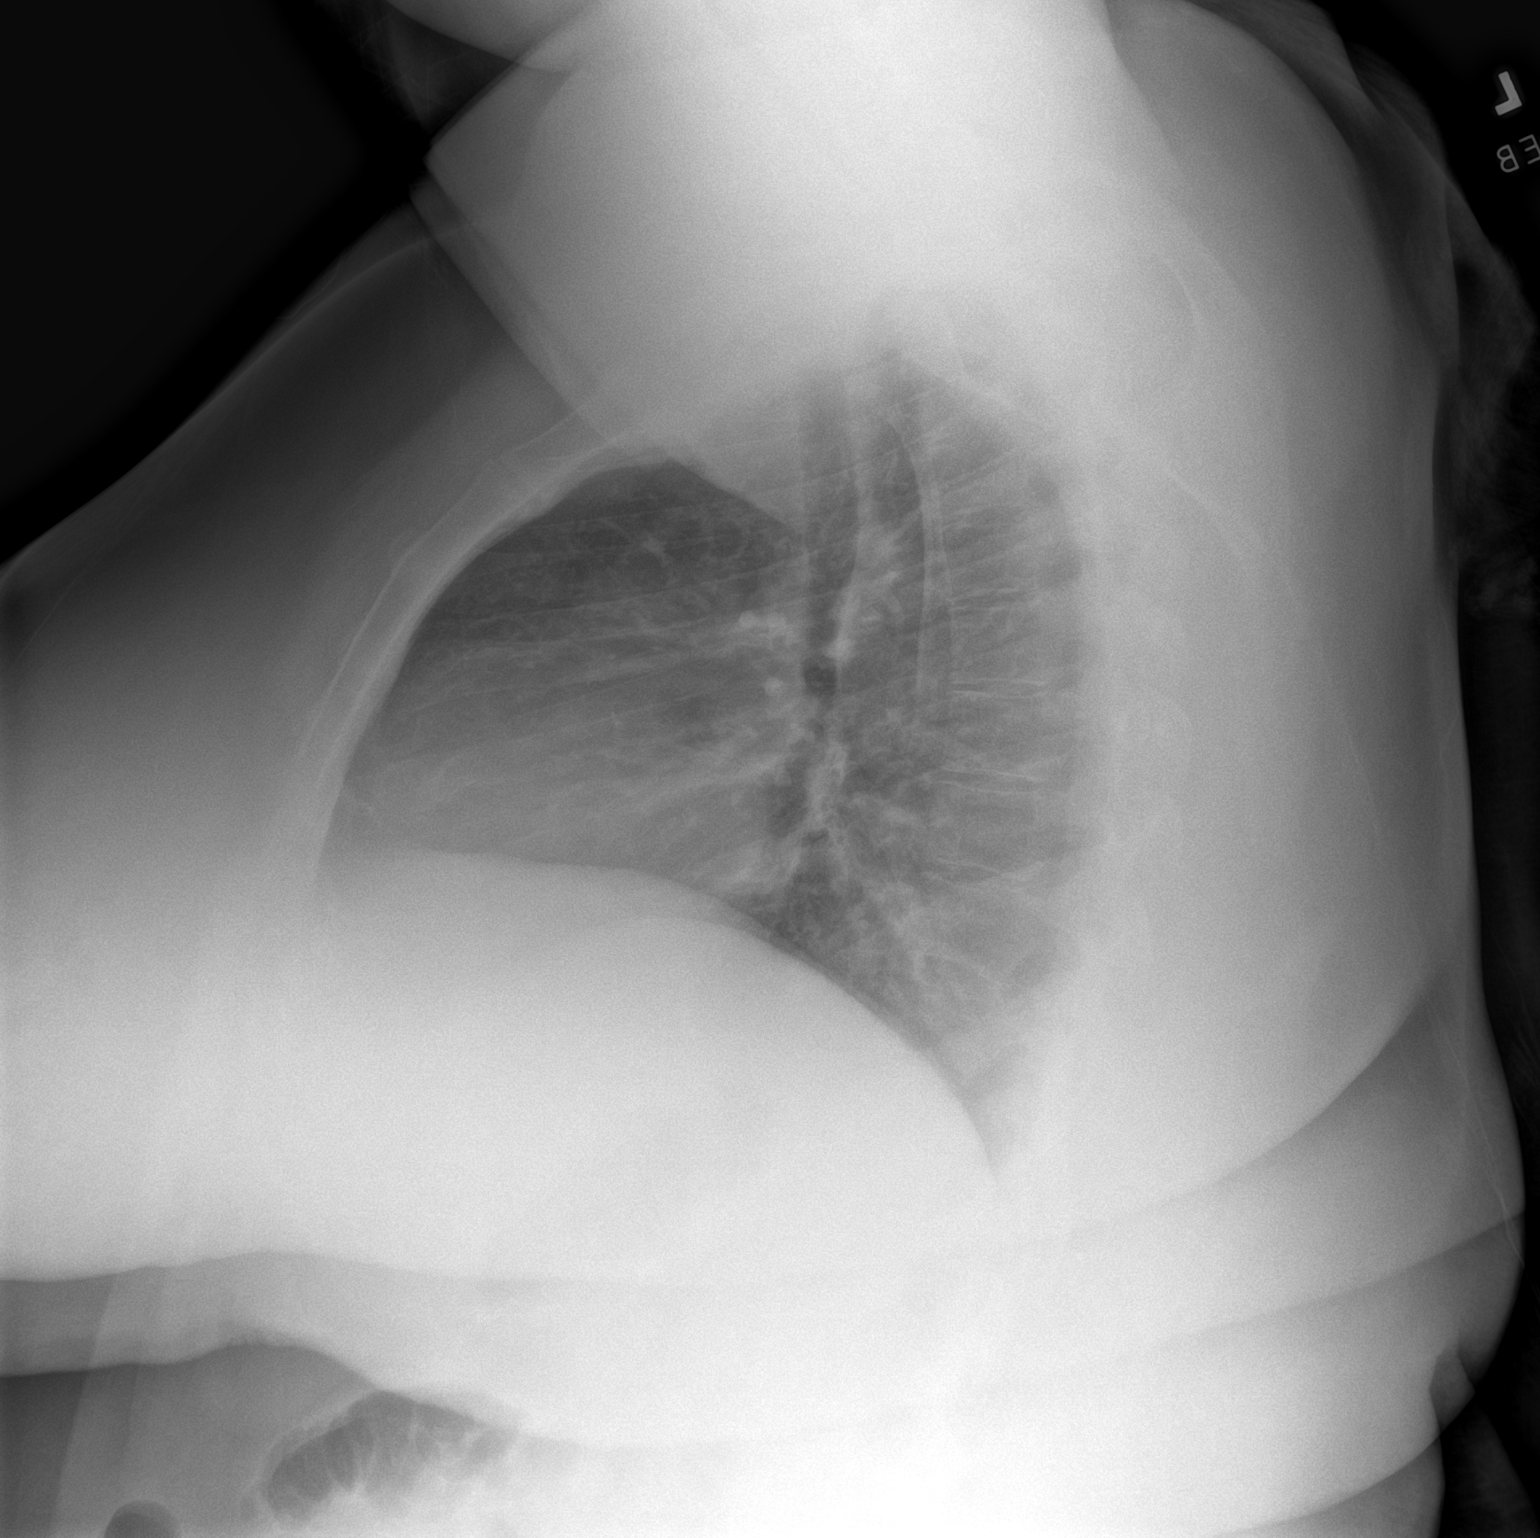

[2 of 2 positions shown; findings below may reference images not displayed]

FINDINGS: There is an area of ill-defined opacity in the right upper lobe
anteriorly, a likely focus of pneumonia. Lungs elsewhere clear.
Heart size and pulmonary vascularity are normal. No adenopathy. No
bone lesions.
IMPRESSION: Ill-defined airspace opacity right upper lobe anteriorly, likely a
focus of pneumonia. Lungs elsewhere clear. Cardiac silhouette
normal.

## 2021-05-19 MED ORDER — PREDNISONE 20 MG PO TABS: 40.0000 mg | ORAL_TABLET | Freq: Every day | ORAL | 0 refills | Status: AC

## 2021-05-19 MED ORDER — DOXYCYCLINE HYCLATE 100 MG PO CAPS: 100.0000 mg | ORAL_CAPSULE | Freq: Two times a day (BID) | ORAL | 0 refills | Status: AC

## 2021-05-19 MED ORDER — DOXYCYCLINE HYCLATE 100 MG PO TABS
100.0000 mg | ORAL_TABLET | Freq: Once | ORAL | Status: AC
  Administered 2021-05-19: 100 mg via ORAL
  Filled 2021-05-19: qty 1

## 2021-05-19 MED ORDER — IPRATROPIUM-ALBUTEROL 0.5-2.5 (3) MG/3ML IN SOLN
3.0000 mL | Freq: Once | RESPIRATORY_TRACT | Status: AC
  Administered 2021-05-19: 3 mL via RESPIRATORY_TRACT
  Filled 2021-05-19: qty 3

## 2021-05-19 MED ORDER — HYDROCOD POLST-CPM POLST ER 10-8 MG/5ML PO SUER
5.0000 mL | Freq: Once | ORAL | Status: AC
  Administered 2021-05-19: 5 mL via ORAL
  Filled 2021-05-19: qty 5

## 2021-05-19 NOTE — ED Notes (Signed)
Patient verbalizes understanding of discharge instructions. Opportunity for questioning and answers were provided. Armband removed by staff, pt discharged from ED.  

## 2021-05-19 NOTE — ED Provider Notes (Signed)
[facility] EMERGENCY DEPT Provider Note   CSN: [id number] Arrival date & time: 05/19/21  1512     History Chief Complaint  Patient presents with  . Sore Throat  . Shortness of Breath    Felicia Bradley is a 29 y.o. female.  Presenting to ER with concern for cough, congestion, sore throat and chills.  No documented fevers at home.  Cough is nonproductive.  Has had mild sore throat as well.  Feels somewhat short of breath.  Denies any associated chest pain or back pain.  Has history of DVT/PE, no longer on anticoagulation, diabetes, hypertension, obesity, asthma.  HPI     Past Medical History:  Diagnosis Date  . Asthma   . Bipolar disorder (HCC)   . Diabetes mellitus without complication (HCC)    type 2  . DVT (deep venous thrombosis) (HCC) 2015   w/ recannulation per Care Everywhere  . Hypertension   . Obesity   . Pulmonary embolism (HCC) 2015    Patient Active Problem List   Diagnosis Date Noted  . History of DVT (deep vein thrombosis) 03/23/2021  . History of pulmonary embolus (PE) 03/23/2021  . PTSD (post-traumatic stress disorder) 03/23/2021  . Bipolar 1 disorder (HCC) 03/23/2021  . BMI 50.0-59.9, adult (HCC) 03/23/2021  . SIRS (systemic inflammatory response syndrome) (HCC) 03/19/2021  . Chest pain 03/19/2021  . Emesis 03/19/2021  . Malingering 01/22/2020  . Acute respiratory disease due to COVID-19 virus 01/18/2020  . Asthma 01/18/2020  . Asthma exacerbation 01/14/2020  . Type 2 DM with CKD and hypertension (HCC) 01/14/2020  . OSA (obstructive sleep apnea) 01/14/2020  . Suspected COVID-19 virus infection     Past Surgical History:  Procedure Laterality Date  . CESAREAN SECTION       OB History    Gravida  3   Para      Term      Preterm      AB  2   Living  0     SAB  1   IAB  1   Ectopic      Multiple      Live Births              Family History  Problem Relation Age of Onset  . Diabetes Mother   . Asthma  Mother   . Asthma Maternal Aunt   . Diabetes Maternal Aunt     Social History   Tobacco Use  . Smoking status: Never Smoker  . Smokeless tobacco: Never Used  Substance Use Topics  . Alcohol use: No  . Drug use: No    Home Medications Prior to Admission medications   Medication Sig Start Date End Date Taking? Authorizing Provider  doxycycline (VIBRAMYCIN) 100 MG capsule Take 1 capsule (100 mg total) by mouth 2 (two) times daily for 7 days. 05/19/21 05/26/21 Yes Orra Nolde, [place] Livings, MD  predniSONE (DELTASONE) 20 MG tablet Take 2 tablets (40 mg total) by mouth daily for 5 days. 05/19/21 05/24/21 Yes Reyah Streeter, [place] Livings, MD  acetaminophen (TYLENOL) 500 MG tablet Take 500 mg by mouth every 6 (six) hours as needed for mild pain.    [provider]  albuterol (VENTOLIN HFA) 108 (90 Base) MCG/ACT inhaler Inhale 1 puff into the lungs 2 (two) times daily as needed for wheezing or shortness of breath.  11/10/14   [provider]  ALPRAZolam Prudy Feeler) 0.5 MG tablet Take 0.5 mg by mouth 2 (two) times daily. 01/23/21  [provider]  ARIPiprazole (ABILIFY) 2 MG tablet Take 2 mg by mouth daily.    [provider]  aspirin EC 81 MG tablet Take 81 mg by mouth daily. Swallow whole.    [provider]  brexpiprazole (REXULTI) 2 MG TABS tablet Take 2 mg by mouth daily.    [provider]  budesonide-formoterol (SYMBICORT) 160-4.5 MCG/ACT inhaler Inhale 2 puffs into the lungs 2 (two) times daily. 03/27/21   [place], Eric J, DO  busPIRone (BUSPAR) 7.5 MG tablet Take 7.5 mg by mouth in the morning and at bedtime.    [provider]  celecoxib (CELEBREX) 100 MG capsule Take 100 mg by mouth 2 (two) times daily as needed for pain. 02/24/21   [provider]  cyclobenzaprine (FLEXERIL) 10 MG tablet Take 10 mg by mouth 3 (three) times daily as needed for muscle spasms.    [provider]  diclofenac Sodium (VOLTAREN) 1 % GEL Apply 2 g topically  4 (four) times daily. Patient taking differently: Apply 2 g topically 2 (two) times daily as needed (pain). 06/28/20   Hall-Potvin, [place], PA-C  enoxaparin (LOVENOX) 100 mg/mL SOLN injection Inject 2.05 mLs (205 mg total) into the skin daily for 4 days. 04/19/21 04/23/21  Cheryll Cockayne, MD  EPINEPHrine 0.3 mg/0.3 mL IJ SOAJ injection Inject 0.3 mg into the muscle as needed for anaphylaxis.  11/10/14   [provider]  ferrous sulfate 325 (65 FE) MG EC tablet Take 325 mg by mouth 2 (two) times daily with a meal. 11/10/14   [provider]  folic acid (FOLVITE) 1 MG tablet Take 1 tablet (1 mg total) by mouth daily. 03/22/21   Marguerita Merles Latif, DO  gabapentin (NEURONTIN) 300 MG capsule Take 300 mg by mouth 3 (three) times daily.    [provider]  guaiFENesin-dextromethorphan (ROBITUSSIN DM) 100-10 MG/5ML syrup Take 5 mLs by mouth every 4 (four) hours as needed for cough. 03/27/21   [place], Eric J, DO  ipratropium-albuterol (DUONEB) 0.5-2.5 (3) MG/3ML SOLN Take 3 mLs by nebulization in the morning, at noon, and at bedtime. 07/19/19   [provider]  LATUDA 20 MG TABS tablet Take 20 mg by mouth daily. 11/19/20   [provider]  Multiple Vitamin (MULTIVITAMIN WITH MINERALS) TABS tablet Take 1 tablet by mouth daily.    [provider]  omeprazole (PRILOSEC) 20 MG capsule Take 1 capsule (20 mg total) by mouth daily. 04/28/21   Horton, Mayer Masker, MD  ondansetron (ZOFRAN) 4 MG tablet Take 1 tablet (4 mg total) by mouth every 6 (six) hours. 03/30/21   Couture, Cortni S, PA-C  oxyCODONE-acetaminophen (PERCOCET) 10-325 MG tablet Take 1 tablet by mouth 3 (three) times daily as needed for severe pain. 02/24/21   [provider]  senna-docusate (SENOKOT-S) 8.6-50 MG tablet Take 1 tablet by mouth at bedtime. 03/21/21   Marguerita Merles Latif, DO  sertraline (ZOLOFT) 50 MG tablet Take 50 mg by mouth daily. 01/13/21   [provider]  sucralfate  (CARAFATE) 1 GM/10ML suspension Take 1 g by mouth 2 (two) times daily before a meal.    [provider]  traZODone (DESYREL) 100 MG tablet Take 200 mg by mouth at bedtime. 11/19/20   [provider]  warfarin (COUMADIN) 2 MG tablet Take 2 mg by mouth daily.    [provider]    Allergies    Contrast media [iodinated diagnostic agents], Dilaudid [hydromorphone], Tramadol, Apple, Banana, Coconut oil, Dexamethasone,  Other, Solu-medrol [methylprednisolone], and Toradol [ketorolac tromethamine]  Review of Systems   Review of Systems  Constitutional: Positive for chills and fatigue. Negative for fever.  HENT: Positive for sore throat. Negative for ear pain.   Eyes: Negative for pain and visual disturbance.  Respiratory: Positive for cough and shortness of breath.   Cardiovascular: Negative for chest pain and palpitations.  Gastrointestinal: Negative for abdominal pain and vomiting.  Genitourinary: Negative for dysuria and hematuria.  Musculoskeletal: Negative for arthralgias and back pain.  Skin: Negative for color change and rash.  Neurological: Negative for seizures and syncope.  All other systems reviewed and are negative.   Physical Exam Updated Vital Signs BP (!) 134/102   Pulse 97   Temp 98.8 F (37.1 C) (Oral)   Resp 18   Ht 5\' 3"  (1.6 m)   Wt (!) 142.9 kg   LMP 04/09/2021   SpO2 98%   BMI 55.80 kg/m   Physical Exam Vitals and nursing note reviewed.  Constitutional:      General: She is not in acute distress.    Appearance: She is well-developed.  HENT:     Head: Normocephalic and atraumatic.  Eyes:     Conjunctiva/sclera: Conjunctivae normal.  Cardiovascular:     Rate and Rhythm: Normal rate and regular rhythm.     Heart sounds: No murmur heard.   Pulmonary:     Comments: No tachypnea, no increased work of breathing, there is slight expiratory wheeze bilaterally Abdominal:     Palpations: Abdomen is soft.     Tenderness: There is no  abdominal tenderness.  Musculoskeletal:     Cervical back: Neck supple.  Skin:    General: Skin is warm and dry.  Neurological:     Mental Status: She is alert.     ED Results / Procedures / Treatments   Labs (all labs ordered are listed, but only abnormal results are displayed) Labs Reviewed  CBC WITH DIFFERENTIAL/PLATELET - Abnormal; Notable for the following components:      Result Value   Hemoglobin 11.8 (*)    RDW 15.7 (*)    All other components within normal limits  GROUP A STREP BY PCR  RESP PANEL BY RT-PCR (FLU A&B, COVID) ARPGX2  BASIC METABOLIC PANEL  PROTIME-INR    EKG None  Radiology DG Chest 2 View  Result Date: 05/19/2021 CLINICAL DATA:  Shortness of breath with cough EXAM: CHEST - 2 VIEW COMPARISON:  Apr 19, 2021 FINDINGS: There is an area of ill-defined opacity in the right upper lobe anteriorly, a likely focus of pneumonia. Lungs elsewhere clear. Heart size and pulmonary vascularity are normal. No adenopathy. No bone lesions. IMPRESSION: Ill-defined airspace opacity right upper lobe anteriorly, likely a focus of pneumonia. Lungs elsewhere clear. Cardiac silhouette normal. Electronically Signed   By: Bretta BangWilliam  Woodruff III M.D.   On: 05/19/2021 15:51    Procedures Procedures   Medications Ordered in ED Medications  ipratropium-albuterol (DUONEB) 0.5-2.5 (3) MG/3ML nebulizer solution 3 mL (3 mLs Nebulization Given 05/19/21 1708)  doxycycline (VIBRA-TABS) tablet 100 mg (100 mg Oral Given 05/19/21 1701)  chlorpheniramine-HYDROcodone (TUSSIONEX) 10-8 MG/5ML suspension 5 mL (5 mLs Oral Given 05/19/21 1837)    ED Course  I have reviewed the triage vital signs and the nursing notes.  Pertinent labs & imaging results that were available during my care of the patient were reviewed by me and considered in my medical decision making (see chart for details).    MDM Rules/Calculators/A&P  30 year old lady presenting to ER with concern for cough,  chills, sore throat, shortness of breath.  On exam patient noted to be well-appearing in no distress.  She did have mild wheeze on exam.  Basic labs were stable.  Chest x-ray with likely focus of pneumonia.  Provided patient breathing treatment and started on antibiotics.  On reassessment symptoms improved and she remains well-appearing.  Given overall symptomatology suspect most likely symptoms are related to pneumonia or viral respiratory illness.  Patient no longer taking Coumadin and does have history of DVT/PE.  She reports severe contrast allergy.  I had lengthy discussion with patient about inability to completely rule out PE without a VQ scan or CTA.  As we do not have VQ scan at this facility it would require transfer.  Employed shared decision making.  Patient would like to be discharged and is agreeable to trial of steroids and antibiotics.  She demonstrating good understanding that should she develop any increased work of breathing, chest pain, back pain, syncope or other new concerning symptom she should go immediately to ER for reassessment.  Stressed need for close PCP follow-up.  Discharged home.  After the discussed management above, the patient was determined to be safe for discharge.  The patient was in agreement with this plan and all questions regarding their care were answered.  ED return precautions were discussed and the patient will return to the ED with any significant worsening of condition.  Final Clinical Impression(s) / ED Diagnoses Final diagnoses:  Community acquired pneumonia of right upper lobe of lung  Exacerbation of asthma, unspecified asthma severity, unspecified whether persistent    Rx / DC Orders ED Discharge Orders         Ordered    predniSONE (DELTASONE) 20 MG tablet  Daily        05/19/21 1856    doxycycline (VIBRAMYCIN) 100 MG capsule  2 times daily        05/19/21 1856           Milagros Loll, MD 05/20/21 [phone]

## 2021-05-19 NOTE — Discharge Instructions (Addendum)
If you develop any increased difficulty breathing, chest pain, back pain, fever, you should go immediately to ER for reassessment.  Tomorrow morning, please call your primary care doctor to schedule an appointment for reevaluation.  Take the antibiotic as prescribed for possible pneumonia.  Take steroid as prescribed as well.

## 2021-05-19 NOTE — ED Triage Notes (Signed)
Reports flu like symptoms x6 days, sore throat, shob, fevers. Hx asthma

## 2021-05-26 ENCOUNTER — Other Ambulatory Visit: Payer: Self-pay

## 2021-05-26 ENCOUNTER — Inpatient Hospital Stay (HOSPITAL_BASED_OUTPATIENT_CLINIC_OR_DEPARTMENT_OTHER)
Admission: EM | Admit: 2021-05-26 | Discharge: 2021-05-29 | DRG: 202 | Disposition: A | Discharge status: Home / Self Care | Payer: Medicaid Other | Attending: Internal Medicine | Admitting: Internal Medicine

## 2021-05-26 ENCOUNTER — Emergency Department (HOSPITAL_BASED_OUTPATIENT_CLINIC_OR_DEPARTMENT_OTHER): Payer: Medicaid Other

## 2021-05-26 ENCOUNTER — Encounter (HOSPITAL_BASED_OUTPATIENT_CLINIC_OR_DEPARTMENT_OTHER): Payer: Self-pay

## 2021-05-26 DIAGNOSIS — J441 Chronic obstructive pulmonary disease with (acute) exacerbation: Secondary | ICD-10-CM | POA: Diagnosis present

## 2021-05-26 DIAGNOSIS — K219 Gastro-esophageal reflux disease without esophagitis: Secondary | ICD-10-CM | POA: Diagnosis present

## 2021-05-26 DIAGNOSIS — Z91041 Radiographic dye allergy status: Secondary | ICD-10-CM

## 2021-05-26 DIAGNOSIS — F319 Bipolar disorder, unspecified: Secondary | ICD-10-CM | POA: Diagnosis present

## 2021-05-26 DIAGNOSIS — G4733 Obstructive sleep apnea (adult) (pediatric): Secondary | ICD-10-CM | POA: Diagnosis present

## 2021-05-26 DIAGNOSIS — E119 Type 2 diabetes mellitus without complications: Secondary | ICD-10-CM | POA: Diagnosis present

## 2021-05-26 DIAGNOSIS — Z79899 Other long term (current) drug therapy: Secondary | ICD-10-CM

## 2021-05-26 DIAGNOSIS — Z86711 Personal history of pulmonary embolism: Secondary | ICD-10-CM

## 2021-05-26 DIAGNOSIS — Z7982 Long term (current) use of aspirin: Secondary | ICD-10-CM

## 2021-05-26 DIAGNOSIS — J9621 Acute and chronic respiratory failure with hypoxia: Secondary | ICD-10-CM

## 2021-05-26 DIAGNOSIS — Z7901 Long term (current) use of anticoagulants: Secondary | ICD-10-CM

## 2021-05-26 DIAGNOSIS — Z86718 Personal history of other venous thrombosis and embolism: Secondary | ICD-10-CM

## 2021-05-26 DIAGNOSIS — Z6841 Body Mass Index (BMI) 40.0 and over, adult: Secondary | ICD-10-CM

## 2021-05-26 DIAGNOSIS — J4551 Severe persistent asthma with (acute) exacerbation: Principal | ICD-10-CM | POA: Diagnosis present

## 2021-05-26 DIAGNOSIS — Z8616 Personal history of COVID-19: Secondary | ICD-10-CM

## 2021-05-26 DIAGNOSIS — J40 Bronchitis, not specified as acute or chronic: Secondary | ICD-10-CM | POA: Diagnosis present

## 2021-05-26 DIAGNOSIS — Z7951 Long term (current) use of inhaled steroids: Secondary | ICD-10-CM

## 2021-05-26 DIAGNOSIS — Z833 Family history of diabetes mellitus: Secondary | ICD-10-CM

## 2021-05-26 DIAGNOSIS — I10 Essential (primary) hypertension: Secondary | ICD-10-CM | POA: Diagnosis present

## 2021-05-26 LAB — BASIC METABOLIC PANEL
Anion gap: 7 (ref 5–15)
BUN: 12 mg/dL (ref 6–20)
CO2: 29 mmol/L (ref 22–32)
Calcium: 9.3 mg/dL (ref 8.9–10.3)
Chloride: 108 mmol/L (ref 98–111)
Creatinine, Ser: 0.79 mg/dL (ref 0.44–1.00)
GFR, Estimated: >60 mL/min (ref >60)
Glucose, Bld: 103 mg/dL — ABNORMAL HIGH (ref 70–99)
Potassium: 3.9 mmol/L (ref 3.5–5.1)
Sodium: 144 mmol/L (ref 135–145)

## 2021-05-26 LAB — CBC WITH DIFFERENTIAL/PLATELET
Abs Immature Granulocytes: 0.02 10*3/uL (ref 0.00–0.07)
Basophils Absolute: 0.1 10*3/uL (ref 0.0–0.1)
Basophils Relative: 1 %
Eosinophils Absolute: 0.1 10*3/uL (ref 0.0–0.5)
Eosinophils Relative: 1 %
HCT: 35.9 % — ABNORMAL LOW (ref 36.0–46.0)
Hemoglobin: 11 g/dL — ABNORMAL LOW (ref 12.0–15.0)
Immature Granulocytes: 0 %
Lymphocytes Relative: 46 %
Lymphs Abs: 3.8 10*3/uL (ref 0.7–4.0)
MCH: 26.6 pg (ref 26.0–34.0)
MCHC: 30.6 g/dL (ref 30.0–36.0)
MCV: 86.7 fL (ref 80.0–100.0)
Monocytes Absolute: 0.4 10*3/uL (ref 0.1–1.0)
Monocytes Relative: 4 %
Neutro Abs: 4.1 10*3/uL (ref 1.7–7.7)
Neutrophils Relative %: 48 %
Platelets: 343 10*3/uL (ref 150–400)
RBC: 4.14 MIL/uL (ref 3.87–5.11)
RDW: 15.9 % — ABNORMAL HIGH (ref 11.5–15.5)
WBC: 8.4 10*3/uL (ref 4.0–10.5)
nRBC: 0 % (ref 0.0–0.2)

## 2021-05-26 LAB — LACTIC ACID, PLASMA: Lactic Acid, Venous: 0.8 mmol/L (ref 0.5–1.9)

## 2021-05-26 IMAGING — DX DG CHEST 1V PORT
1 series · 1 of 1 positions shown · non-contrast
Comparison: Chest radiograph dated [DATE]

CLINICAL DATA: 29-year-old female with cough and asthma.

EXAM:
PORTABLE CHEST 1 VIEW

[chest]
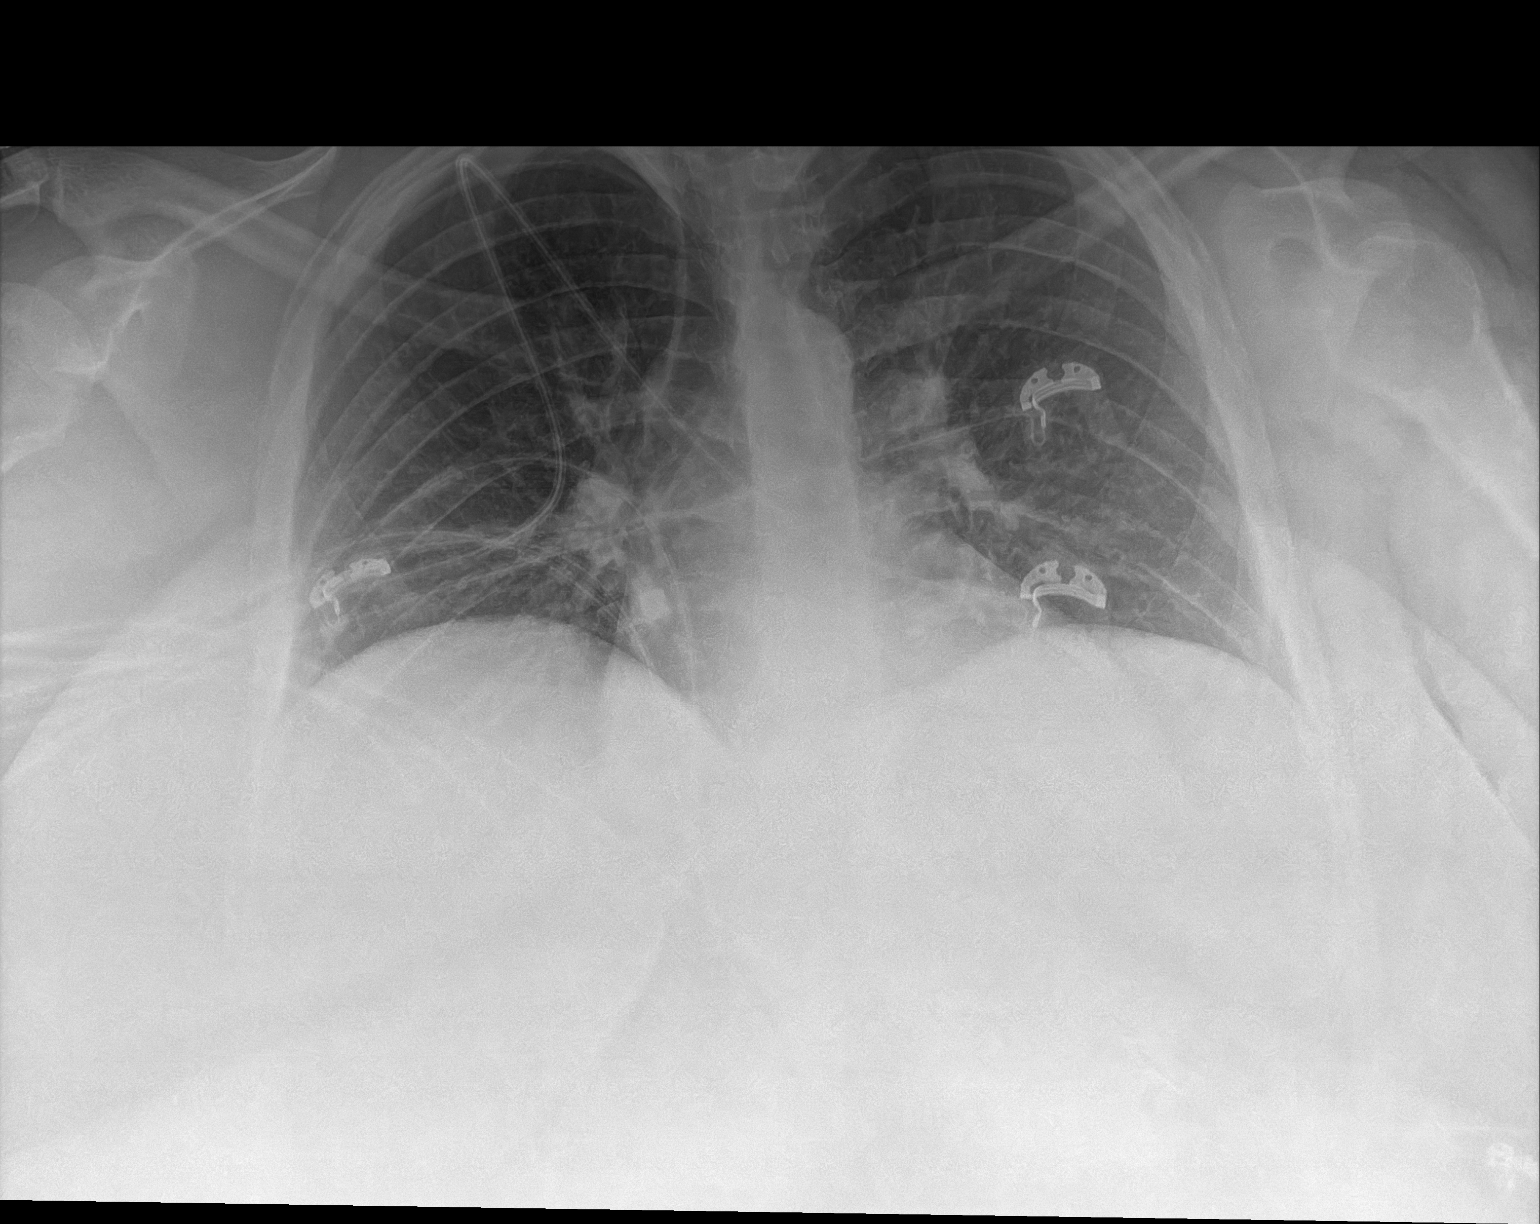

[1 of 1 positions shown; findings below may reference images not displayed]

FINDINGS: Shallow inspiration. No focal consolidation, pleural effusion, or
pneumothorax. The cardiac silhouette is within limits. No acute
osseous pathology.
IMPRESSION: No active disease.

## 2021-05-26 MED ORDER — IPRATROPIUM BROMIDE 0.02 % IN SOLN
1.0000 mg | Freq: Once | RESPIRATORY_TRACT | Status: AC
  Administered 2021-05-26: 1 mg via RESPIRATORY_TRACT
  Filled 2021-05-26: qty 5

## 2021-05-26 MED ORDER — ONDANSETRON HCL 4 MG/2ML IJ SOLN
4.0000 mg | Freq: Once | INTRAMUSCULAR | Status: AC
  Administered 2021-05-26: 4 mg via INTRAVENOUS
  Filled 2021-05-26: qty 2

## 2021-05-26 MED ORDER — METHYLPREDNISOLONE SODIUM SUCC 125 MG IJ SOLR
125.0000 mg | Freq: Once | INTRAMUSCULAR | Status: AC
  Administered 2021-05-26: 125 mg via INTRAVENOUS
  Filled 2021-05-26: qty 2

## 2021-05-26 MED ORDER — MAGNESIUM SULFATE 2 GM/50ML IV SOLN
2.0000 g | Freq: Once | INTRAVENOUS | Status: AC
  Administered 2021-05-26: 2 g via INTRAVENOUS
  Filled 2021-05-26: qty 50

## 2021-05-26 MED ORDER — ALBUTEROL (5 MG/ML) CONTINUOUS INHALATION SOLN
10.0000 mg/h | INHALATION_SOLUTION | Freq: Once | RESPIRATORY_TRACT | Status: AC
  Administered 2021-05-26: 10 mg/h via RESPIRATORY_TRACT
  Filled 2021-05-26: qty 20

## 2021-05-26 NOTE — ED Provider Notes (Signed)
[facility]   CSN: [id number] Arrival date & time: 05/26/21  2205     History Chief Complaint  Patient presents with   Shortness of Breath   Emesis    [name] is a 30 y.o. female.  HPI     This is a 30 year old female who presents with shortness of breath and cough.  Patient reports history of asthma and recent pneumonia.  She reports recent course of antibiotics which she has finished.  She is currently on a prednisone taper.  For the last several days has had increasing shortness of breath that has been refractory to her home albuterol.  She has a history of prior intubation and multiple asthma exacerbations.  No known fevers or COVID exposures.  She has had a cough and some posttussive emesis.  No abdominal pain.  History of PE.  Initially denied taking her Coumadin.  Level 5 caveat for acuity of condition  Past Medical History:  Diagnosis Date   Asthma    Bipolar disorder (HCC)    Diabetes mellitus without complication (HCC)    type 2   DVT (deep venous thrombosis) (HCC) 2015   w/ recannulation per Care Everywhere   Hypertension    Obesity    Pulmonary embolism (HCC) 2015    Patient Active Problem List   Diagnosis Date Noted   History of DVT (deep vein thrombosis) 03/23/2021   History of pulmonary embolus (PE) 03/23/2021   PTSD (post-traumatic stress disorder) 03/23/2021   Bipolar 1 disorder (HCC) 03/23/2021   BMI 50.0-59.9, adult (HCC) 03/23/2021   SIRS (systemic inflammatory response syndrome) (HCC) 03/19/2021   Chest pain 03/19/2021   Emesis 03/19/2021   Malingering 01/22/2020   Acute respiratory disease due to COVID-19 virus 01/18/2020   Asthma 01/18/2020   Asthma exacerbation 01/14/2020   Type 2 DM with CKD and hypertension (HCC) 01/14/2020   OSA (obstructive sleep apnea) 01/14/2020   Suspected COVID-19 virus infection     Past Surgical History:  Procedure Laterality Date   CESAREAN SECTION        OB History     Gravida  3   Para      Term      Preterm      AB  2   Living  0      SAB  1   IAB  1   Ectopic      Multiple      Live Births              Family History  Problem Relation Age of Onset   Diabetes Mother    Asthma Mother    Asthma Maternal Aunt    Diabetes Maternal Aunt     Social History   Tobacco Use   Smoking status: Never   Smokeless tobacco: Never  Substance Use Topics   Alcohol use: No   Drug use: No    Home Medications Prior to Admission medications   Medication Sig Start Date End Date Taking? Authorizing Provider  acetaminophen (TYLENOL) 500 MG tablet Take 500 mg by mouth every 6 (six) hours as needed for mild pain.    [provider]  albuterol (VENTOLIN HFA) 108 (90 Base) MCG/ACT inhaler Inhale 1 puff into the lungs 2 (two) times daily as needed for wheezing or shortness of breath.  11/10/14   [provider]  ALPRAZolam Prudy Feeler(XANAX) 0.5 MG tablet Take 0.5 mg by mouth 2 (two) times daily. 01/23/21  [provider]  ARIPiprazole (ABILIFY) 2 MG tablet Take 2 mg by mouth daily.    [provider]  aspirin EC 81 MG tablet Take 81 mg by mouth daily. Swallow whole.    [provider]  brexpiprazole (REXULTI) 2 MG TABS tablet Take 2 mg by mouth daily.    [provider]  budesonide-formoterol (SYMBICORT) 160-4.5 MCG/ACT inhaler Inhale 2 puffs into the lungs 2 (two) times daily. 03/27/21   [place], Eric J, DO  busPIRone (BUSPAR) 7.5 MG tablet Take 7.5 mg by mouth in the morning and at bedtime.    [provider]  celecoxib (CELEBREX) 100 MG capsule Take 100 mg by mouth 2 (two) times daily as needed for pain. 02/24/21   [provider]  cyclobenzaprine (FLEXERIL) 10 MG tablet Take 10 mg by mouth 3 (three) times daily as needed for muscle spasms.    [provider]  diclofenac Sodium (VOLTAREN) 1 % GEL Apply 2 g topically 4 (four) times daily. Patient taking  differently: Apply 2 g topically 2 (two) times daily as needed (pain). 06/28/20   Hall-Potvin, [place], PA-C  enoxaparin (LOVENOX) 100 mg/mL SOLN injection Inject 2.05 mLs (205 mg total) into the skin daily for 4 days. 04/19/21 04/23/21  Cheryll Cockayne, MD  EPINEPHrine 0.3 mg/0.3 mL IJ SOAJ injection Inject 0.3 mg into the muscle as needed for anaphylaxis.  11/10/14   [provider]  ferrous sulfate 325 (65 FE) MG EC tablet Take 325 mg by mouth 2 (two) times daily with a meal. 11/10/14   [provider]  folic acid (FOLVITE) 1 MG tablet Take 1 tablet (1 mg total) by mouth daily. 03/22/21   Marguerita Merles Latif, DO  gabapentin (NEURONTIN) 300 MG capsule Take 300 mg by mouth 3 (three) times daily.    [provider]  guaiFENesin-dextromethorphan (ROBITUSSIN DM) 100-10 MG/5ML syrup Take 5 mLs by mouth every 4 (four) hours as needed for cough. 03/27/21   [place], Eric J, DO  ipratropium-albuterol (DUONEB) 0.5-2.5 (3) MG/3ML SOLN Take 3 mLs by nebulization in the morning, at noon, and at bedtime. 07/19/19   [provider]  LATUDA 20 MG TABS tablet Take 20 mg by mouth daily. 11/19/20   [provider]  Multiple Vitamin (MULTIVITAMIN WITH MINERALS) TABS tablet Take 1 tablet by mouth daily.    [provider]  omeprazole (PRILOSEC) 20 MG capsule Take 1 capsule (20 mg total) by mouth daily. 04/28/21   Laderrick Wilk, Mayer Masker, MD  ondansetron (ZOFRAN) 4 MG tablet Take 1 tablet (4 mg total) by mouth every 6 (six) hours. 03/30/21   Couture, Cortni S, PA-C  oxyCODONE-acetaminophen (PERCOCET) 10-325 MG tablet Take 1 tablet by mouth 3 (three) times daily as needed for severe pain. 02/24/21   [provider]  senna-docusate (SENOKOT-S) 8.6-50 MG tablet Take 1 tablet by mouth at bedtime. 03/21/21   Marguerita Merles Latif, DO  sertraline (ZOLOFT) 50 MG tablet Take 50 mg by mouth daily. 01/13/21   [provider]  sucralfate (CARAFATE) 1 GM/10ML suspension Take 1 g by  mouth 2 (two) times daily before a meal.    [provider]  traZODone (DESYREL) 100 MG tablet Take 200 mg by mouth at bedtime. 11/19/20   [provider]  warfarin (COUMADIN) 2 MG tablet Take 2 mg by mouth daily.    [provider]    Allergies    Contrast media [iodinated diagnostic agents], Dilaudid [hydromorphone], Tramadol, Apple, Banana, Coconut oil, Dexamethasone,  Other, Solu-medrol [methylprednisolone], and Toradol [ketorolac tromethamine]  Review of Systems   Review of Systems  Constitutional:  Negative for fever.  Respiratory:  Positive for cough, shortness of breath and wheezing.   Cardiovascular:  Negative for chest pain.  Gastrointestinal:  Positive for nausea and vomiting. Negative for abdominal pain.  Genitourinary:  Negative for dysuria.  All other systems reviewed and are negative.  Physical Exam Updated Vital Signs BP 120/69   Pulse 100   Temp 98.3 F (36.8 C) (Oral)   Resp (!) 22   Ht 1.6 m (5\' 3" )   Wt (!) 140.6 kg   SpO2 95%   BMI 54.91 kg/m   Physical Exam Vitals and nursing note reviewed.  Constitutional:      Appearance: She is obese. She is ill-appearing. She is not toxic-appearing.  HENT:     Head: Normocephalic and atraumatic.  Eyes:     Pupils: Pupils are equal, round, and reactive to light.  Cardiovascular:     Rate and Rhythm: Regular rhythm. Tachycardia present.     Heart sounds: Normal heart sounds.  Pulmonary:     Effort: Pulmonary effort is normal. Tachypnea present. No respiratory distress.     Breath sounds: Wheezing present.     Comments: Speaking in short sentences, increased respiratory rate with shallow breaths and poor air movement, diffuse expiratory wheezing Abdominal:     General: Bowel sounds are normal.     Palpations: Abdomen is soft.  Musculoskeletal:     Cervical back: Neck supple.     Right lower leg: No tenderness. No edema.     Left lower leg: No tenderness. No edema.  Skin:    General:  Skin is warm and dry.  Neurological:     Mental Status: She is alert and oriented to person, place, and time.  Psychiatric:        Mood and Affect: Mood normal.    ED Results / Procedures / Treatments   Labs (all labs ordered are listed, but only abnormal results are displayed) Labs Reviewed  BASIC METABOLIC PANEL - Abnormal; Notable for the following components:      Result Value   Glucose, Bld 103 (*)    All other components within normal limits  CBC WITH DIFFERENTIAL/PLATELET - Abnormal; Notable for the following components:   Hemoglobin 11.0 (*)    HCT 35.9 (*)    RDW 15.9 (*)    All other components within normal limits  RESP PANEL BY RT-PCR (FLU A&B, COVID) ARPGX2  LACTIC ACID, PLASMA  LACTIC ACID, PLASMA  D-DIMER, QUANTITATIVE  PROTIME-INR    EKG None  Radiology DG Chest Port 1 View  Result Date: 05/26/2021 CLINICAL DATA:  30 year old female with cough and asthma. EXAM: PORTABLE CHEST 1 VIEW COMPARISON:  Chest radiograph dated 05/19/2021 FINDINGS: Shallow inspiration. No focal consolidation, pleural effusion, or pneumothorax. The cardiac silhouette is within limits. No acute osseous pathology. IMPRESSION: No active disease. Electronically Signed   By: 07/19/2021 M.D.   On: 05/26/2021 22:54    Procedures .Critical Care  Date/Time: 05/27/2021 4:22 AM Performed by: 05/29/2021, MD Authorized by: Shon Baton, MD   Critical care provider statement:    Critical care time (minutes):  60   Critical care was necessary to treat or prevent imminent or life-threatening deterioration of the following conditions:  Respiratory failure   Critical care was time spent personally by me on the following activities:  Discussions with consultants, evaluation of patient's response  to treatment, examination of patient, ordering and performing treatments and interventions, ordering and review of laboratory studies, ordering and review of radiographic studies, pulse  oximetry, re-evaluation of patient's condition, obtaining history from patient or surrogate and review of old charts   Medications Ordered in ED Medications  methylPREDNISolone sodium succinate (SOLU-MEDROL) 125 mg/2 mL injection 125 mg (125 mg Intravenous Given 05/26/21 2303)  albuterol (PROVENTIL,VENTOLIN) solution continuous neb (10 mg/hr Nebulization Given 05/26/21 2248)  ipratropium (ATROVENT) nebulizer solution 1 mg (1 mg Nebulization Given 05/26/21 2248)  magnesium sulfate IVPB 2 g 50 mL (0 g Intravenous Stopped 05/26/21 2355)  ondansetron (ZOFRAN) injection 4 mg (4 mg Intravenous Given 05/26/21 2310)  ipratropium-albuterol (DUONEB) 0.5-2.5 (3) MG/3ML nebulizer solution 3 mL (3 mLs Nebulization Given 05/27/21 0136)    ED Course  I have reviewed the triage vital signs and the nursing notes.  Pertinent labs & imaging results that were available during my care of the patient were reviewed by me and considered in my medical decision making (see chart for details).  Clinical Course as of 05/27/21 0423  Tue May 27, 2021  0130 Patient resting more comfortably.  Still mildly tachypneic but better aeration with improved wheezing.  Was noted to desat while sleeping.  Highly suspect some component of sleep apnea given her weight.  Oxygen was titrated off and she is satting 92 to 94%.  She was given a second DuoNeb.  We will continue to monitor closely.  She would like to go home if at all possible.  Will reevaluate. [CH]  0331 On recheck, patient clinically much improved.  Better aeration with minimal wheezing.  She has noted to desat with deep sleep.  She reports history of obstructive sleep apnea and reports that she is supposed to be on CPAP.  We will ambulate make sure she does not get significantly tachypneic or hypoxic. [CH]  0332 Patient dropped O2 sats to 82% and got significantly tachycardic with ambulation.  COVID testing sent.  She does have a history of PE and initially said she was no longer  taking her Coumadin but then stated that she was.  Have added PT/INR.  While her presentation is most consistent with acute asthma exacerbation, we will send a screening D-dimer.  Patient has a severe contrast allergy. [CH]  0421 D-dimer is negative.  PT/INR is pending.  Patient still on 2 L of oxygen and resting comfortably.  Given acute on chronic hypoxic respiratory failure and likely acute asthma exacerbation will admit to the hospital. [CH]    Clinical Course User Index [CH] Yichen Gilardi, Mayer Masker, MD   MDM Rules/Calculators/A&P                           Patient presents with worsening shortness of breath.  Recently diagnosed with pneumonia and finished a course of antibiotics.  Also reports that she is on a steroid taper.  She is significantly tachypneic with increased work of breathing and accessory muscle use on exam.  Wheezing audibly.  Patient was given steroids, magnesium, continuous DuoNeb.  See clinical course above.  Given her clinical presentation, highly suspect asthma exacerbation.  However, pneumonia, COVID-19, other infectious etiology is also consideration.  She also has a history of PE.  Unclear whether she is compliant with her Coumadin.  We will send PT/INR and D-dimer.  She has a severe contrast allergy and cannot obtain a CT in this facility does not have V/Q.  Patient did  have significant improvement with treatment of asthma; however, had persistent desaturations with sleeping and precipitously desaturated with ambulation.  For this reason, we will plan for admission to the hospital.  She had COVID-19 in April.  However, given recurrence and reinfection, will recent COVID-19 testing.  PT/INR is also pending.  Chest x-ray shows no evidence of recurrent pneumonia or pneumothorax.  This was independently reviewed by myself.  Final Clinical Impression(s) / ED Diagnoses Final diagnoses:  Acute on chronic respiratory failure with hypoxia (HCC)  Severe persistent asthma with  exacerbation    Rx / DC Orders ED Discharge Orders     None        Anaia Frith, Mayer Masker, MD 05/27/21 [phone]

## 2021-05-26 NOTE — ED Provider Notes (Signed)
Emergency Medicine Provider Triage Evaluation Note  LYNETTA Bradley , a 30 y.o. female  was evaluated in triage.  Pt complains of worsening asthma.  She has had cough but no fever she reports recently treated for pneumonia and finished antibiotics about a week ago.  Patient also had a prednisone taper.  Review of Systems  Positive: Cough, shortness of breath Negative: Vomiting diarrhea  Physical Exam  BP (!) 115/91   Pulse (!) 116   Temp 98.3 F (36.8 C) (Oral)   Resp 20   Ht 5\' 3"  (1.6 m)   Wt (!) 140.6 kg   SpO2 94%   BMI 54.91 kg/m  Gen:   Patient is alert with mild to moderate increased work of breathing.  Mental status clear.  Speaking in full sentences Resp:  Mild to moderate increased work of breathing.  Diffuse wheezing throughout the lung fields. MSK:   Moves extremities without difficulty    Medical Decision Making  Medically screening exam initiated at 10:39 PM.  Appropriate orders placed.  was informed that the remainder of the evaluation will be completed by another provider, this initial triage assessment does not replace that evaluation, and the importance of remaining in the ED until their evaluation is complete.  Patient has history of severe asthma.  At this time she has mild to moderate increased work of breathing with harsh paroxysmal cough.  She reports history of frequent asthma exacerbation and history of being intubated.  At this time no sign of impending respiratory failure.  Patient is started on continuous albuterol therapy with Atrovent, Solu-Medrol and magnesium.   Alcide Goodness, MD 05/26/21 2241

## 2021-05-26 NOTE — ED Triage Notes (Signed)
Pt c/o SOB and persistent vomiting  -  states she has breathing  tx at home but it didn't work  PMX - asthma - PE - DM type 2

## 2021-05-27 DIAGNOSIS — E119 Type 2 diabetes mellitus without complications: Secondary | ICD-10-CM | POA: Diagnosis present

## 2021-05-27 DIAGNOSIS — Z79899 Other long term (current) drug therapy: Secondary | ICD-10-CM | POA: Diagnosis not present

## 2021-05-27 DIAGNOSIS — J441 Chronic obstructive pulmonary disease with (acute) exacerbation: Secondary | ICD-10-CM

## 2021-05-27 DIAGNOSIS — I10 Essential (primary) hypertension: Secondary | ICD-10-CM | POA: Diagnosis present

## 2021-05-27 DIAGNOSIS — F319 Bipolar disorder, unspecified: Secondary | ICD-10-CM | POA: Diagnosis present

## 2021-05-27 DIAGNOSIS — Z91041 Radiographic dye allergy status: Secondary | ICD-10-CM | POA: Diagnosis not present

## 2021-05-27 DIAGNOSIS — J40 Bronchitis, not specified as acute or chronic: Secondary | ICD-10-CM | POA: Diagnosis present

## 2021-05-27 DIAGNOSIS — Z8616 Personal history of COVID-19: Secondary | ICD-10-CM | POA: Diagnosis not present

## 2021-05-27 DIAGNOSIS — Z86711 Personal history of pulmonary embolism: Secondary | ICD-10-CM | POA: Diagnosis not present

## 2021-05-27 DIAGNOSIS — Z7951 Long term (current) use of inhaled steroids: Secondary | ICD-10-CM | POA: Diagnosis not present

## 2021-05-27 DIAGNOSIS — Z86718 Personal history of other venous thrombosis and embolism: Secondary | ICD-10-CM | POA: Diagnosis not present

## 2021-05-27 DIAGNOSIS — Z833 Family history of diabetes mellitus: Secondary | ICD-10-CM | POA: Diagnosis not present

## 2021-05-27 DIAGNOSIS — Z7901 Long term (current) use of anticoagulants: Secondary | ICD-10-CM | POA: Diagnosis not present

## 2021-05-27 DIAGNOSIS — K219 Gastro-esophageal reflux disease without esophagitis: Secondary | ICD-10-CM | POA: Diagnosis present

## 2021-05-27 DIAGNOSIS — R0602 Shortness of breath: Secondary | ICD-10-CM | POA: Diagnosis present

## 2021-05-27 DIAGNOSIS — J45901 Unspecified asthma with (acute) exacerbation: Secondary | ICD-10-CM | POA: Diagnosis not present

## 2021-05-27 DIAGNOSIS — G4733 Obstructive sleep apnea (adult) (pediatric): Secondary | ICD-10-CM | POA: Diagnosis present

## 2021-05-27 DIAGNOSIS — Z6841 Body Mass Index (BMI) 40.0 and over, adult: Secondary | ICD-10-CM | POA: Diagnosis not present

## 2021-05-27 DIAGNOSIS — J4551 Severe persistent asthma with (acute) exacerbation: Secondary | ICD-10-CM | POA: Diagnosis present

## 2021-05-27 DIAGNOSIS — Z7982 Long term (current) use of aspirin: Secondary | ICD-10-CM | POA: Diagnosis not present

## 2021-05-27 LAB — CBC
HCT: 40.5 % (ref 36.0–46.0)
Hemoglobin: 12.2 g/dL (ref 12.0–15.0)
MCH: 26.5 pg (ref 26.0–34.0)
MCHC: 30.1 g/dL (ref 30.0–36.0)
MCV: 88 fL (ref 80.0–100.0)
Platelets: 365 10*3/uL (ref 150–400)
RBC: 4.6 MIL/uL (ref 3.87–5.11)
RDW: 16 % — ABNORMAL HIGH (ref 11.5–15.5)
WBC: 7.6 10*3/uL (ref 4.0–10.5)
nRBC: 0 % (ref 0.0–0.2)

## 2021-05-27 LAB — RESP PANEL BY RT-PCR (FLU A&B, COVID) ARPGX2
Influenza A by PCR: NEGATIVE
Influenza B by PCR: NEGATIVE
SARS Coronavirus 2 by RT PCR: NEGATIVE

## 2021-05-27 LAB — LACTIC ACID, PLASMA: Lactic Acid, Venous: 1.3 mmol/L (ref 0.5–1.9)

## 2021-05-27 LAB — D-DIMER, QUANTITATIVE: D-Dimer, Quant: <0.27 ug/mL-FEU (ref 0.00–0.50)

## 2021-05-27 LAB — GLUCOSE, CAPILLARY: Glucose-Capillary: 279 mg/dL — ABNORMAL HIGH (ref 70–99)

## 2021-05-27 LAB — PROTIME-INR
INR: 1 (ref 0.8–1.2)
Prothrombin Time: 13.2 seconds (ref 11.4–15.2)

## 2021-05-27 LAB — CREATININE, SERUM
Creatinine, Ser: 0.85 mg/dL (ref 0.44–1.00)
GFR, Estimated: >60 mL/min (ref >60)

## 2021-05-27 MED ORDER — ASPIRIN EC 81 MG PO TBEC
81.0000 mg | DELAYED_RELEASE_TABLET | Freq: Every day | ORAL | Status: DC
  Administered 2021-05-27 – 2021-05-29 (×3): 81 mg via ORAL
  Filled 2021-05-27 (×3): qty 1

## 2021-05-27 MED ORDER — METOPROLOL TARTRATE 5 MG/5ML IV SOLN: 5.0000 mg | Freq: Four times a day (QID) | INTRAVENOUS | Status: DC | PRN

## 2021-05-27 MED ORDER — ARIPIPRAZOLE 2 MG PO TABS
2.0000 mg | ORAL_TABLET | Freq: Every day | ORAL | Status: DC
  Administered 2021-05-27 – 2021-05-29 (×3): 2 mg via ORAL
  Filled 2021-05-27 (×3): qty 1

## 2021-05-27 MED ORDER — INSULIN ASPART 100 UNIT/ML IJ SOLN
0.0000 [IU] | Freq: Three times a day (TID) | INTRAMUSCULAR | Status: DC
  Administered 2021-05-27: 2 [IU] via SUBCUTANEOUS
  Administered 2021-05-28 (×2): 5 [IU] via SUBCUTANEOUS
  Administered 2021-05-29: 2 [IU] via SUBCUTANEOUS
  Administered 2021-05-29: 5 [IU] via SUBCUTANEOUS

## 2021-05-27 MED ORDER — ACETAMINOPHEN 650 MG RE SUPP: 650.0000 mg | Freq: Four times a day (QID) | RECTAL | Status: DC | PRN

## 2021-05-27 MED ORDER — IPRATROPIUM-ALBUTEROL 0.5-2.5 (3) MG/3ML IN SOLN
3.0000 mL | Freq: Four times a day (QID) | RESPIRATORY_TRACT | Status: DC
  Administered 2021-05-27 (×2): 3 mL via RESPIRATORY_TRACT
  Filled 2021-05-27 (×2): qty 3

## 2021-05-27 MED ORDER — ACETAMINOPHEN 325 MG PO TABS: 650.0000 mg | ORAL_TABLET | Freq: Four times a day (QID) | ORAL | Status: DC | PRN

## 2021-05-27 MED ORDER — PREDNISONE 20 MG PO TABS
40.0000 mg | ORAL_TABLET | Freq: Every day | ORAL | Status: DC
  Administered 2021-05-28 – 2021-05-29 (×2): 40 mg via ORAL
  Filled 2021-05-27 (×2): qty 2

## 2021-05-27 MED ORDER — ENOXAPARIN SODIUM 60 MG/0.6ML IJ SOSY
60.0000 mg | PREFILLED_SYRINGE | INTRAMUSCULAR | Status: DC
  Filled 2021-05-27 (×3): qty 0.6

## 2021-05-27 MED ORDER — BENZONATATE 100 MG PO CAPS: 100.0000 mg | ORAL_CAPSULE | Freq: Three times a day (TID) | ORAL | Status: DC | PRN

## 2021-05-27 MED ORDER — IPRATROPIUM-ALBUTEROL 0.5-2.5 (3) MG/3ML IN SOLN
3.0000 mL | Freq: Once | RESPIRATORY_TRACT | Status: AC
  Administered 2021-05-27: 3 mL via RESPIRATORY_TRACT
  Filled 2021-05-27: qty 3

## 2021-05-27 MED ORDER — ONDANSETRON HCL 4 MG/2ML IJ SOLN
4.0000 mg | Freq: Four times a day (QID) | INTRAMUSCULAR | Status: DC | PRN
  Administered 2021-05-27 – 2021-05-28 (×2): 4 mg via INTRAVENOUS
  Filled 2021-05-27 (×2): qty 2

## 2021-05-27 MED ORDER — OXYCODONE-ACETAMINOPHEN 5-325 MG PO TABS
2.0000 | ORAL_TABLET | Freq: Three times a day (TID) | ORAL | Status: AC | PRN
  Administered 2021-05-27 – 2021-05-28 (×2): 2 via ORAL
  Filled 2021-05-27 (×2): qty 2

## 2021-05-27 MED ORDER — INSULIN ASPART 100 UNIT/ML IJ SOLN
0.0000 [IU] | Freq: Every day | INTRAMUSCULAR | Status: DC
  Administered 2021-05-27 – 2021-05-28 (×2): 3 [IU] via SUBCUTANEOUS

## 2021-05-27 MED ORDER — ONDANSETRON HCL 4 MG PO TABS: 4.0000 mg | ORAL_TABLET | Freq: Four times a day (QID) | ORAL | Status: DC | PRN

## 2021-05-27 MED ORDER — ALPRAZOLAM 0.5 MG PO TABS
0.5000 mg | ORAL_TABLET | Freq: Three times a day (TID) | ORAL | Status: DC
  Administered 2021-05-27 – 2021-05-29 (×5): 0.5 mg via ORAL
  Filled 2021-05-27 (×5): qty 1

## 2021-05-27 MED ORDER — METHYLPREDNISOLONE SODIUM SUCC 125 MG IJ SOLR
60.0000 mg | Freq: Two times a day (BID) | INTRAMUSCULAR | Status: AC
  Administered 2021-05-27 – 2021-05-28 (×2): 60 mg via INTRAVENOUS
  Filled 2021-05-27 (×2): qty 2

## 2021-05-27 MED ORDER — IPRATROPIUM-ALBUTEROL 0.5-2.5 (3) MG/3ML IN SOLN: 3.0000 mL | RESPIRATORY_TRACT | Status: DC | PRN

## 2021-05-27 MED ORDER — PANTOPRAZOLE SODIUM 40 MG PO TBEC
40.0000 mg | DELAYED_RELEASE_TABLET | Freq: Every day | ORAL | Status: DC
  Administered 2021-05-27 – 2021-05-29 (×3): 40 mg via ORAL
  Filled 2021-05-27 (×3): qty 1

## 2021-05-27 MED ORDER — IPRATROPIUM-ALBUTEROL 0.5-2.5 (3) MG/3ML IN SOLN
3.0000 mL | Freq: Three times a day (TID) | RESPIRATORY_TRACT | Status: DC
  Administered 2021-05-28 – 2021-05-29 (×4): 3 mL via RESPIRATORY_TRACT
  Filled 2021-05-27 (×4): qty 3

## 2021-05-27 NOTE — Plan of Care (Signed)
[facility] will assume care on arrival to accepting facility. Until arrival, care as per EDP. However, [facility] available 24/7 for questions and assistance.  

## 2021-05-27 NOTE — ED Notes (Addendum)
Pt removed from oxygen at this time. SpO2 on room air currently 91-94% while sleeping.

## 2021-05-27 NOTE — H&P (Signed)
History and Physical    Felicia Bradley ZOX:[id number] DOB: Dec 22, 1990 DOA: 05/26/2021  PCP: [facility], [facility]  Patient coming from: Home  Chief Complaint: dyspnea  HPI: Felicia Bradley is a 30 y.o. female with medical history significant of DM2, COPD, bipolar, OSA. Presenting with dyspnea. She reports that she's had dyspnea for 2 weeks. She initially had productive cough of green sputum. She had fevers up to 103. She was seen in the ED for this and given abx and steroids. Since leaving the ED, her she still have symptoms. She states that her cough has changed to a more dry cough, but her breathing has not improved. She decided that to come back to the ED for help. She denies any other aggravating or alleviating factors.    ED Course: CXR was negative. She was found to be dyspneic. She was started on solumedrol, nebs and Mg2+. TRH was called for admission.    Review of Systems:  Denies CP, ab pain, lightheadedness, palpitations, N/V/D. Review of systems is otherwise negative for all not mentioned in HPI.   PMHx Past Medical History:  Diagnosis Date   Asthma    Bipolar disorder (HCC)    Diabetes mellitus without complication (HCC)    type 2   DVT (deep venous thrombosis) (HCC) 2015   w/ recannulation per Care Everywhere   Hypertension    Obesity    Pulmonary embolism (HCC) 2015    PSHx Past Surgical History:  Procedure Laterality Date   CESAREAN SECTION      SocHx  reports that she has never smoked. She has never used smokeless tobacco. She reports that she does not drink alcohol and does not use drugs.  Allergies  Allergen Reactions   Contrast Media [Iodinated Diagnostic Agents] Anaphylaxis   Dilaudid [Hydromorphone] Anaphylaxis   Tramadol Itching   Apple Hives   Banana Hives   Coconut Oil Hives and Itching   Dexamethasone Nausea And Vomiting and Hives    "feels like my pubic hair is on fire"   Other Hives    Zucchini and squash   Solu-Medrol [Methylprednisolone]  Nausea And Vomiting   Toradol [Ketorolac Tromethamine] Hives and Itching    FamHx Family History  Problem Relation Age of Onset   Diabetes Mother    Asthma Mother    Asthma Maternal Aunt    Diabetes Maternal Aunt     Prior to Admission medications   Medication Sig Start Date End Date Taking? Authorizing Provider  acetaminophen (TYLENOL) 500 MG tablet Take 500 mg by mouth every 6 (six) hours as needed for mild pain.    [provider]  albuterol (VENTOLIN HFA) 108 (90 Base) MCG/ACT inhaler Inhale 1 puff into the lungs 2 (two) times daily as needed for wheezing or shortness of breath.  11/10/14   [provider]  ALPRAZolam Prudy Feeler(XANAX) 0.5 MG tablet Take 0.5 mg by mouth 2 (two) times daily. 01/23/21   [provider]  ARIPiprazole (ABILIFY) 2 MG tablet Take 2 mg by mouth daily.    [provider]  aspirin EC 81 MG tablet Take 81 mg by mouth daily. Swallow whole.    [provider]  brexpiprazole (REXULTI) 2 MG TABS tablet Take 2 mg by mouth daily.    [provider]  budesonide-formoterol (SYMBICORT) 160-4.5 MCG/ACT inhaler Inhale 2 puffs into the lungs 2 (two) times daily. 03/27/21   [place], Eric J, DO  busPIRone (BUSPAR) 7.5 MG tablet Take 7.5 mg by mouth in the  morning and at bedtime.    [provider]  celecoxib (CELEBREX) 100 MG capsule Take 100 mg by mouth 2 (two) times daily as needed for pain. 02/24/21   [provider]  cyclobenzaprine (FLEXERIL) 10 MG tablet Take 10 mg by mouth 3 (three) times daily as needed for muscle spasms.    [provider]  diclofenac Sodium (VOLTAREN) 1 % GEL Apply 2 g topically 4 (four) times daily. Patient taking differently: Apply 2 g topically 2 (two) times daily as needed (pain). 06/28/20   Hall-Potvin, [place], PA-C  enoxaparin (LOVENOX) 100 mg/mL SOLN injection Inject 2.05 mLs (205 mg total) into the skin daily for 4 days. 04/19/21 04/23/21  Cheryll Cockayne, MD  EPINEPHrine 0.3  mg/0.3 mL IJ SOAJ injection Inject 0.3 mg into the muscle as needed for anaphylaxis.  11/10/14   [provider]  ferrous sulfate 325 (65 FE) MG EC tablet Take 325 mg by mouth 2 (two) times daily with a meal. 11/10/14   [provider]  folic acid (FOLVITE) 1 MG tablet Take 1 tablet (1 mg total) by mouth daily. 03/22/21   Marguerita Merles Latif, DO  gabapentin (NEURONTIN) 300 MG capsule Take 300 mg by mouth 3 (three) times daily.    [provider]  guaiFENesin-dextromethorphan (ROBITUSSIN DM) 100-10 MG/5ML syrup Take 5 mLs by mouth every 4 (four) hours as needed for cough. 03/27/21   [place], Eric J, DO  ipratropium-albuterol (DUONEB) 0.5-2.5 (3) MG/3ML SOLN Take 3 mLs by nebulization in the morning, at noon, and at bedtime. 07/19/19   [provider]  LATUDA 20 MG TABS tablet Take 20 mg by mouth daily. 11/19/20   [provider]  Multiple Vitamin (MULTIVITAMIN WITH MINERALS) TABS tablet Take 1 tablet by mouth daily.    [provider]  omeprazole (PRILOSEC) 20 MG capsule Take 1 capsule (20 mg total) by mouth daily. 04/28/21   Horton, Mayer Masker, MD  ondansetron (ZOFRAN) 4 MG tablet Take 1 tablet (4 mg total) by mouth every 6 (six) hours. 03/30/21   Couture, Cortni S, PA-C  oxyCODONE-acetaminophen (PERCOCET) 10-325 MG tablet Take 1 tablet by mouth 3 (three) times daily as needed for severe pain. 02/24/21   [provider]  senna-docusate (SENOKOT-S) 8.6-50 MG tablet Take 1 tablet by mouth at bedtime. 03/21/21   Marguerita Merles Latif, DO  sertraline (ZOLOFT) 50 MG tablet Take 50 mg by mouth daily. 01/13/21   [provider]  sucralfate (CARAFATE) 1 GM/10ML suspension Take 1 g by mouth 2 (two) times daily before a meal.    [provider]  traZODone (DESYREL) 100 MG tablet Take 200 mg by mouth at bedtime. 11/19/20   [provider]  warfarin (COUMADIN) 2 MG tablet Take 2 mg by mouth daily.    [provider]    Physical  Exam: Vitals:   05/27/21 0845 05/27/21 0900 05/27/21 1020 05/27/21 1205  BP:   (!) 147/82 (!) 141/99  Pulse: 94 94 (!) 102 93  Resp: (!) 27 (!) 31 18 (!) 22  Temp:   98.3 F (36.8 C) 98.2 F (36.8 C)  TempSrc:   Oral Oral  SpO2: 98% 98% 97% 99%  Weight:      Height:        General: 30 y.o. female resting in bed in NAD Eyes: PERRL, normal sclera ENMT: Nares patent w/o discharge, orophaynx clear, dentition normal, ears w/o discharge/lesions/ulcers Neck: Supple, trachea midline Cardiovascular: RRR, +S1, S2, no m/g/r, equal pulses  throughout Respiratory: diffuse expiratory wheeze w/ some inspiratory wheeze, tight and decreased at bases, increased WOB GI: BS+, obese, NDNT, no masses noted, no organomegaly noted MSK: No e/c/c Skin: No rashes, bruises, ulcerations noted Neuro: A&O x 3, no focal deficits Psyc: Appropriate interaction and affect, calm/cooperative  Labs on Admission: I have personally reviewed following labs and imaging studies  CBC: Recent Labs  Lab 05/26/21 2255  WBC 8.4  NEUTROABS 4.1  HGB 11.0*  HCT 35.9*  MCV 86.7  PLT 343   Basic Metabolic Panel: Recent Labs  Lab 05/26/21 2255  NA 144  K 3.9  CL 108  CO2 29  GLUCOSE 103*  BUN 12  CREATININE 0.79  CALCIUM 9.3   GFR: Estimated Creatinine Clearance: 142.4 mL/min (by C-G formula based on SCr of 0.79 mg/dL). Liver Function Tests: No results for input(s): AST, ALT, ALKPHOS, BILITOT, PROT, ALBUMIN in the last 168 hours. No results for input(s): LIPASE, AMYLASE in the last 168 hours. No results for input(s): AMMONIA in the last 168 hours. Coagulation Profile: Recent Labs  Lab 05/27/21 0353  INR 1.0   Cardiac Enzymes: No results for input(s): CKTOTAL, CKMB, CKMBINDEX, TROPONINI in the last 168 hours. BNP (last 3 results) No results for input(s): PROBNP in the last 8760 hours. HbA1C: No results for input(s): HGBA1C in the last 72 hours. CBG: No results for input(s): GLUCAP in the last 168  hours. Lipid Profile: No results for input(s): CHOL, HDL, LDLCALC, TRIG, CHOLHDL, LDLDIRECT in the last 72 hours. Thyroid Function Tests: No results for input(s): TSH, T4TOTAL, FREET4, T3FREE, THYROIDAB in the last 72 hours. Anemia Panel: No results for input(s): VITAMINB12, FOLATE, FERRITIN, TIBC, IRON, RETICCTPCT in the last 72 hours. Urine analysis:    Component Value Date/Time   COLORURINE YELLOW 04/28/2021 0011   APPEARANCEUR CLEAR 04/28/2021 0011   LABSPEC 1.024 04/28/2021 0011   PHURINE 6.0 04/28/2021 0011   GLUCOSEU NEGATIVE 04/28/2021 0011   HGBUR NEGATIVE 04/28/2021 0011   BILIRUBINUR NEGATIVE 04/28/2021 0011   KETONESUR NEGATIVE 04/28/2021 0011   PROTEINUR NEGATIVE 04/28/2021 0011   UROBILINOGEN 0.2 12/04/2009 1301   NITRITE NEGATIVE 04/28/2021 0011   LEUKOCYTESUR TRACE (A) 04/28/2021 0011    Radiological Exams on Admission: DG Chest Port 1 View  Result Date: 05/26/2021 CLINICAL DATA:  30 year old female with cough and asthma. EXAM: PORTABLE CHEST 1 VIEW COMPARISON:  Chest radiograph dated 05/19/2021 FINDINGS: Shallow inspiration. No focal consolidation, pleural effusion, or pneumothorax. The cardiac silhouette is within limits. No acute osseous pathology. IMPRESSION: No active disease. Electronically Signed   By: Elgie Collard M.D.   On: 05/26/2021 22:54    EKG: None obtained in ED  Assessment/Plan COPD exacerbation     - admitted to inpt, tele     - continue steroids, nebs     - tessalon     - wean O2 as able  DM2     - SSI, A1c, glucose checks, DM diet  Bipolar     - continue home regimen  GERD     - protonix  OSA     - CPAP @ night  Morbid obesity     - she is working w/ [facility] bariatrics on potential surgery     - her coumadin has been held in anticipation of this surgery  Hx of PE/DVT      - last clot was in 2016      - she is holding her coumadin d/t recommendation of bariatrics w/ [facility] in anticipation  of surgery per her report  DVT  prophylaxis: lovenox  Code Status: FULL  Family Communication: None at bedside  Consults called: None   Status is: Inpatient  Remains inpatient appropriate because:Inpatient level of care appropriate due to severity of illness  Dispo: The patient is from: Home              Anticipated d/c is to: Home              Patient currently is not medically stable to d/c.   Difficult to place patient No  Time spent coordinating admission: 70 minutes  Kairon Shock A Jamarious Febo DO [facility]  If 7PM-7AM, please contact night-coverage [email or web address]  05/27/2021, 12:21 PM

## 2021-05-27 NOTE — ED Notes (Signed)
Pt stated that she feels ok at this time.

## 2021-05-27 NOTE — ED Notes (Signed)
2L [place] resumed on patient d/t desaturations (86%) while sleeping.

## 2021-05-27 NOTE — ED Notes (Signed)
Patient placed on 2LNC d/t desaturations while sleeping. SpO2 97%.

## 2021-05-27 NOTE — ED Notes (Signed)
Report given to [facility] 

## 2021-05-27 NOTE — ED Notes (Signed)
Per MD, RT ambulated patient on Room Air. SpO2 decreased to mid 80's, with dyspnea. Pt c/o dizziness. RN notified and helped assist patient back to room. Pt placed on 2LNC, currently 95%. Dr. Wilkie Aye notified.

## 2021-05-27 NOTE — ED Notes (Signed)
Patient states symptoms mildly improved following breathing txs. BBS diminished with mile expiratory wheezing. Strong forceful cough.

## 2021-05-27 NOTE — ED Notes (Signed)
[facility] at bedside 

## 2021-05-28 DIAGNOSIS — Z86711 Personal history of pulmonary embolism: Secondary | ICD-10-CM

## 2021-05-28 DIAGNOSIS — J45901 Unspecified asthma with (acute) exacerbation: Secondary | ICD-10-CM

## 2021-05-28 DIAGNOSIS — G4733 Obstructive sleep apnea (adult) (pediatric): Secondary | ICD-10-CM

## 2021-05-28 LAB — CBC
HCT: 38.6 % (ref 36.0–46.0)
Hemoglobin: 11.5 g/dL — ABNORMAL LOW (ref 12.0–15.0)
MCH: 26.7 pg (ref 26.0–34.0)
MCHC: 29.8 g/dL — ABNORMAL LOW (ref 30.0–36.0)
MCV: 89.8 fL (ref 80.0–100.0)
Platelets: 326 10*3/uL (ref 150–400)
RBC: 4.3 MIL/uL (ref 3.87–5.11)
RDW: 16.2 % — ABNORMAL HIGH (ref 11.5–15.5)
WBC: 13.3 10*3/uL — ABNORMAL HIGH (ref 4.0–10.5)
nRBC: 0 % (ref 0.0–0.2)

## 2021-05-28 LAB — COMPREHENSIVE METABOLIC PANEL
ALT: 18 U/L (ref 0–44)
AST: 15 U/L (ref 15–41)
Albumin: 3.3 g/dL — ABNORMAL LOW (ref 3.5–5.0)
Alkaline Phosphatase: 50 U/L (ref 38–126)
Anion gap: 6 (ref 5–15)
BUN: 14 mg/dL (ref 6–20)
CO2: 26 mmol/L (ref 22–32)
Calcium: 9.4 mg/dL (ref 8.9–10.3)
Chloride: 107 mmol/L (ref 98–111)
Creatinine, Ser: 0.82 mg/dL (ref 0.44–1.00)
GFR, Estimated: >60 mL/min (ref >60)
Glucose, Bld: 235 mg/dL — ABNORMAL HIGH (ref 70–99)
Potassium: 5 mmol/L (ref 3.5–5.1)
Sodium: 139 mmol/L (ref 135–145)
Total Bilirubin: 0.1 mg/dL — ABNORMAL LOW (ref 0.3–1.2)
Total Protein: 7.6 g/dL (ref 6.5–8.1)

## 2021-05-28 LAB — HEMOGLOBIN A1C
Hgb A1c MFr Bld: 6.9 % — ABNORMAL HIGH (ref 4.8–5.6)
Mean Plasma Glucose: 151 mg/dL

## 2021-05-28 LAB — GLUCOSE, CAPILLARY
Glucose-Capillary: 201 mg/dL — ABNORMAL HIGH (ref 70–99)
Glucose-Capillary: 201 mg/dL — ABNORMAL HIGH (ref 70–99)
Glucose-Capillary: 274 mg/dL — ABNORMAL HIGH (ref 70–99)

## 2021-05-28 MED ORDER — AZITHROMYCIN 250 MG PO TABS
250.0000 mg | ORAL_TABLET | Freq: Every day | ORAL | Status: DC
  Administered 2021-05-29: 250 mg via ORAL
  Filled 2021-05-28: qty 1

## 2021-05-28 MED ORDER — AZITHROMYCIN 250 MG PO TABS
500.0000 mg | ORAL_TABLET | Freq: Every day | ORAL | Status: AC
  Administered 2021-05-28: 500 mg via ORAL
  Filled 2021-05-28: qty 2

## 2021-05-28 NOTE — Plan of Care (Signed)
°  Problem: Nutrition: °Goal: Adequate nutrition will be maintained °Outcome: Progressing °  °Problem: Activity: °Goal: Risk for activity intolerance will decrease °Outcome: Progressing °  °Problem: Elimination: °Goal: Will not experience complications related to bowel motility °Outcome: Progressing °  °

## 2021-05-28 NOTE — Progress Notes (Signed)
PROGRESS NOTE    Felicia GoodnessShareka M Bradley  ZOX:[id number] DOB: [birth year] DOA: 05/26/2021 PCP: [facility], [facility]    Brief Narrative:  Felicia Bradley is a 30 y.o. female with medical history significant of DM2, COPD, bipolar, OSA. Presenting with dyspnea. She reports that she's had dyspnea for 2 weeks. She initially had productive cough of green sputum. She had fevers up to 103. She was seen in the ED for this and given abx and steroids. Since leaving the ED, her she still have symptoms. She states that her cough has changed to a more dry cough, but her breathing has not improved. She decided that to come back to the ED for help. She denies any other aggravating or alleviating factors  Chest x-ray negative  Consultants:    Procedures:   Antimicrobials:      Subjective: Holding nonproductive cough with feeling of congestion.  Feels just a little better but still not at baseline.  Objective: Vitals:   05/27/21 2247 05/28/21 0010 05/28/21 0751 05/28/21 1503  BP:  126/79    Pulse:  98    Resp: (!) 21 (!) 21    Temp:  98.3 F (36.8 C)    TempSrc:  Oral    SpO2:  100% 99% 98%  Weight:      Height:        Intake/Output Summary (Last 24 hours) at 05/28/2021 1713 Last data filed at 05/28/2021 1212 Gross per 24 hour  Intake 1908 ml  Output --  Net 1908 ml   Filed Weights   05/26/21 2219  Weight: (!) 140.6 kg    Examination:  General exam: Appears calm and comfortable  Respiratory system: End expiratory time wheezing cardiovascular system: S1 & S2 heard, RRR. No JVD, murmurs, rubs, gallops or clicks.  Gastrointestinal system: Abdomen is nondistended, soft and nontender. No organomegaly or masses felt. Normal bowel sounds heard. Central nervous system: Alert and oriented. No focal neurological deficits. Extremities: No edema Skin: Warm and dry Psychiatry: Judgement and insight appear normal. Mood & affect appropriate.     Data Reviewed: I have personally reviewed following  labs and imaging studies  CBC: Recent Labs  Lab 05/26/21 2255 05/27/21 1405 05/28/21 0521  WBC 8.4 7.6 13.3*  NEUTROABS 4.1  --   --   HGB 11.0* 12.2 11.5*  HCT 35.9* 40.5 38.6  MCV 86.7 88.0 89.8  PLT 343 365 326   Basic Metabolic Panel: Recent Labs  Lab 05/26/21 2255 05/27/21 1405 05/28/21 0521  NA 144  --  139  K 3.9  --  5.0  CL 108  --  107  CO2 29  --  26  GLUCOSE 103*  --  235*  BUN 12  --  14  CREATININE 0.79 0.85 0.82  CALCIUM 9.3  --  9.4   GFR: Estimated Creatinine Clearance: 138.9 mL/min (by C-G formula based on SCr of 0.82 mg/dL). Liver Function Tests: Recent Labs  Lab 05/28/21 0521  AST 15  ALT 18  ALKPHOS 50  BILITOT 0.1*  PROT 7.6  ALBUMIN 3.3*   No results for input(s): LIPASE, AMYLASE in the last 168 hours. No results for input(s): AMMONIA in the last 168 hours. Coagulation Profile: Recent Labs  Lab 05/27/21 0353  INR 1.0   Cardiac Enzymes: No results for input(s): CKTOTAL, CKMB, CKMBINDEX, TROPONINI in the last 168 hours. BNP (last 3 results) No results for input(s): PROBNP in the last 8760 hours. HbA1C: Recent Labs    05/27/21 1405  HGBA1C 6.9*   CBG: Recent Labs  Lab 05/27/21 2147 05/28/21 0803 05/28/21 1701  GLUCAP 279* 201* 201*   Lipid Profile: No results for input(s): CHOL, HDL, LDLCALC, TRIG, CHOLHDL, LDLDIRECT in the last 72 hours. Thyroid Function Tests: No results for input(s): TSH, T4TOTAL, FREET4, T3FREE, THYROIDAB in the last 72 hours. Anemia Panel: No results for input(s): VITAMINB12, FOLATE, FERRITIN, TIBC, IRON, RETICCTPCT in the last 72 hours. Sepsis Labs: Recent Labs  Lab 05/26/21 2255 05/27/21 0045  LATICACIDVEN 0.8 1.3    Recent Results (from the past 240 hour(s))  Group A Strep by PCR     Status: None   Collection Time: 05/19/21  3:29 PM   Specimen: Throat; Sterile Swab  Result Value Ref Range Status   Group A Strep by PCR NOT DETECTED NOT DETECTED Final    Comment: Performed at [facility], [address] [place], [place] [address]  Resp Panel by RT-PCR (Flu A&B, Covid) Nasopharyngeal Swab     Status: None   Collection Time: 05/19/21  3:54 PM   Specimen: Nasopharyngeal Swab; Nasopharyngeal(NP) swabs in vial transport medium  Result Value Ref Range Status   SARS Coronavirus 2 by RT PCR NEGATIVE NEGATIVE Final    Comment: (NOTE) SARS-CoV-2 target nucleic acids are NOT DETECTED.  The SARS-CoV-2 RNA is generally detectable in upper respiratory specimens during the acute phase of infection. The lowest concentration of SARS-CoV-2 viral copies this assay can detect is 138 copies/mL. A negative result does not preclude SARS-Cov-2 infection and should not be used as the sole basis for treatment or other patient management decisions. A negative result may occur with  improper specimen collection/handling, submission of specimen other than nasopharyngeal swab, presence of viral mutation(s) within the areas targeted by this assay, and inadequate number of viral copies(<138 copies/mL). A negative result must be combined with clinical observations, patient history, and epidemiological information. The expected result is Negative.  Fact Sheet for Patients:  BloggerCourse.com  Fact Sheet for Healthcare Providers:  SeriousBroker.it  This test is no t yet approved or cleared by the [place] FDA and  has been authorized for detection and/or diagnosis of SARS-CoV-2 by FDA under an Emergency Use Authorization (EUA). This EUA will remain  in effect (meaning this test can be used) for the duration of the COVID-19 declaration under Section 564(b)(1) of the Act, 21 U.S.C.section 360bbb-3(b)(1), unless the authorization is terminated  or revoked sooner.       Influenza A by PCR NEGATIVE NEGATIVE Final   Influenza B by PCR NEGATIVE NEGATIVE Final    Comment: (NOTE) The Xpert Xpress SARS-CoV-2/FLU/RSV plus  assay is intended as an aid in the diagnosis of influenza from Nasopharyngeal swab specimens and should not be used as a sole basis for treatment. Nasal washings and aspirates are unacceptable for Xpert Xpress SARS-CoV-2/FLU/RSV testing.  Fact Sheet for Patients: BloggerCourse.com  Fact Sheet for Healthcare Providers: SeriousBroker.it  This test is not yet approved or cleared by the [place] FDA and has been authorized for detection and/or diagnosis of SARS-CoV-2 by FDA under an Emergency Use Authorization (EUA). This EUA will remain in effect (meaning this test can be used) for the duration of the COVID-19 declaration under Section 564(b)(1) of the Act, 21 U.S.C. section 360bbb-3(b)(1), unless the authorization is terminated or revoked.  Performed at [facility], [address], [place], [place] [address]   Resp Panel by RT-PCR (Flu A&B, Covid) Nasopharyngeal Swab     Status: None  Collection Time: 05/27/21  3:44 AM   Specimen: Nasopharyngeal Swab; Nasopharyngeal(NP) swabs in vial transport medium  Result Value Ref Range Status   SARS Coronavirus 2 by RT PCR NEGATIVE NEGATIVE Final    Comment: (NOTE) SARS-CoV-2 target nucleic acids are NOT DETECTED.  The SARS-CoV-2 RNA is generally detectable in upper respiratory specimens during the acute phase of infection. The lowest concentration of SARS-CoV-2 viral copies this assay can detect is 138 copies/mL. A negative result does not preclude SARS-Cov-2 infection and should not be used as the sole basis for treatment or other patient management decisions. A negative result may occur with  improper specimen collection/handling, submission of specimen other than nasopharyngeal swab, presence of viral mutation(s) within the areas targeted by this assay, and inadequate number of viral copies(<138 copies/mL). A negative result must be combined with clinical  observations, patient history, and epidemiological information. The expected result is Negative.  Fact Sheet for Patients:  BloggerCourse.com  Fact Sheet for Healthcare Providers:  SeriousBroker.it  This test is no t yet approved or cleared by the [place] FDA and  has been authorized for detection and/or diagnosis of SARS-CoV-2 by FDA under an Emergency Use Authorization (EUA). This EUA will remain  in effect (meaning this test can be used) for the duration of the COVID-19 declaration under Section 564(b)(1) of the Act, 21 U.S.C.section 360bbb-3(b)(1), unless the authorization is terminated  or revoked sooner.       Influenza A by PCR NEGATIVE NEGATIVE Final   Influenza B by PCR NEGATIVE NEGATIVE Final    Comment: (NOTE) The Xpert Xpress SARS-CoV-2/FLU/RSV plus assay is intended as an aid in the diagnosis of influenza from Nasopharyngeal swab specimens and should not be used as a sole basis for treatment. Nasal washings and aspirates are unacceptable for Xpert Xpress SARS-CoV-2/FLU/RSV testing.  Fact Sheet for Patients: BloggerCourse.com  Fact Sheet for Healthcare Providers: SeriousBroker.it  This test is not yet approved or cleared by the [place] FDA and has been authorized for detection and/or diagnosis of SARS-CoV-2 by FDA under an Emergency Use Authorization (EUA). This EUA will remain in effect (meaning this test can be used) for the duration of the COVID-19 declaration under Section 564(b)(1) of the Act, 21 U.S.C. section 360bbb-3(b)(1), unless the authorization is terminated or revoked.  Performed at [facility], [address] [place], [place] [address]          Radiology Studies: DG Chest [place] 1 View  Result Date: 05/26/2021 CLINICAL DATA:  30 year old female with cough and asthma. EXAM: PORTABLE CHEST 1 VIEW COMPARISON:   Chest radiograph dated 05/19/2021 FINDINGS: Shallow inspiration. No focal consolidation, pleural effusion, or pneumothorax. The cardiac silhouette is within limits. No acute osseous pathology. IMPRESSION: No active disease. Electronically Signed   By: Elgie Collard M.D.   On: 05/26/2021 22:54        Scheduled Meds:  ALPRAZolam  0.5 mg Oral TID   ARIPiprazole  2 mg Oral Daily   aspirin EC  81 mg Oral Daily   azithromycin  500 mg Oral Daily   Followed by   Melene Muller ON 05/29/2021] azithromycin  250 mg Oral Daily   enoxaparin (LOVENOX) injection  60 mg Subcutaneous Q24H   insulin aspart  0-15 Units Subcutaneous TID WC   insulin aspart  0-5 Units Subcutaneous QHS   ipratropium-albuterol  3 mL Nebulization TID   pantoprazole  40 mg Oral Daily   predniSONE  40 mg Oral Q breakfast   Continuous Infusions:  Assessment & Plan:   Active Problems:   Acute asthma exacerbation   COPD exacerbation Still symptomatic not at baseline Continue steroids Will star warmt zpack Continue nebs   DM2 Continue R ISS    Bipolar  continue home regimen   GERD Continue PPI   OSA     - CPAP @ night   Morbid obesity     - she is working w/ [facility] bariatrics on potential surgery     - her coumadin has been held in anticipation of this surgery   Hx of PE/DVT      - last clot was in 2016      - she is holding her coumadin d/t recommendation of bariatrics w/ [facility] in anticipation of surgery per her report   DVT prophylaxis: Lovenox Code Status: Full Family Communication: None at bedside Disposition Plan:  Status is: Inpatient  Remains inpatient appropriate because:Inpatient level of care appropriate due to severity of illness  Dispo: The patient is from: Home              Anticipated d/c is to: Home              Patient currently is not medically stable to d/c.   Difficult to place patient No            LOS: 1 day   Time spent: 35 minutes with more than 50% on COC    Lynn Ito, MD [facility] Pager [phone]  If 7PM-7AM, please contact night-coverage 05/28/2021, 5:13 PM

## 2021-05-28 NOTE — Progress Notes (Signed)
   05/28/21 2157  Assess: MEWS Score  Resp 20  Level of Consciousness Alert  Patient Activity (if Appropriate) In bed  Assess: MEWS Score  MEWS Temp 0  MEWS Systolic 0  MEWS Pulse 1  MEWS RR 0  MEWS LOC 0  MEWS Score 1  MEWS Score Color Green  Assess: if the MEWS score is Yellow or Red  Were vital signs taken at a resting state? Yes (Pt had a breathing treatment at the time.)  Does the patient meet 2 or more of the SIRS criteria? No  MEWS guidelines implemented *See Row Information* No, vital signs rechecked  Treat  Pain Scale 0-10  Pain Score 0  Notify: Charge Nurse/RN  Name of Charge Nurse/RN Notified Tom, RN  Date Charge Nurse/RN Notified 05/28/21  Time Charge Nurse/RN Notified 2201  Document  Patient Outcome  (Pt is not yellow MEWS.)  Progress note created (see row info) Yes  Assess: SIRS CRITERIA  SIRS Temperature  0  SIRS Pulse 1  SIRS Respirations  0  SIRS WBC 0  SIRS Score Sum  1

## 2021-05-29 DIAGNOSIS — Z86718 Personal history of other venous thrombosis and embolism: Secondary | ICD-10-CM

## 2021-05-29 LAB — GLUCOSE, CAPILLARY
Glucose-Capillary: 141 mg/dL — ABNORMAL HIGH (ref 70–99)
Glucose-Capillary: 203 mg/dL — ABNORMAL HIGH (ref 70–99)

## 2021-05-29 LAB — POTASSIUM: Potassium: 4.5 mmol/L (ref 3.5–5.1)

## 2021-05-29 MED ORDER — PANTOPRAZOLE SODIUM 40 MG PO TBEC: 40.0000 mg | DELAYED_RELEASE_TABLET | Freq: Every day | ORAL | 0 refills | Status: DC

## 2021-05-29 MED ORDER — AZITHROMYCIN 250 MG PO TABS: 250.0000 mg | ORAL_TABLET | Freq: Every day | ORAL | 0 refills | Status: AC

## 2021-05-29 MED ORDER — PREDNISONE 20 MG PO TABS: 20.0000 mg | ORAL_TABLET | Freq: Every day | ORAL | 0 refills | Status: AC

## 2021-05-29 MED ORDER — IPRATROPIUM-ALBUTEROL 0.5-2.5 (3) MG/3ML IN SOLN: 3.0000 mL | Freq: Two times a day (BID) | RESPIRATORY_TRACT | Status: DC

## 2021-05-29 MED ORDER — BENZONATATE 100 MG PO CAPS: 100.0000 mg | ORAL_CAPSULE | Freq: Three times a day (TID) | ORAL | 0 refills | Status: AC | PRN

## 2021-05-29 NOTE — Plan of Care (Signed)

## 2021-05-29 NOTE — Progress Notes (Signed)
Discharge instructions given with stated understanding.  Patient waiting for transportation home at this time 

## 2021-05-29 NOTE — TOC Transition Note (Signed)
Transition of Care [facility]) - CM/SW Discharge Note   Patient Details  Name: Felicia Bradley MRN: [id number] Date of Birth: [birth year]  Transition of Care [facility]) CM/SW Contact:  Larrie Kass, LCSW Phone Number: 05/29/2021, 2:11 PM   Clinical Narrative:    CSW confirmed follow up appointment with patient's PCP to be July 6th at 9:15 am. CSW contacted patient and informed her of this information. TOC sign off.   Final next level of care: Home/Self Care Barriers to Discharge: No Barriers Identified   Patient Goals and CMS Choice        Discharge Placement                       Discharge Plan and Services                                     Social Determinants of Health (SDOH) Interventions     Readmission Risk Interventions Readmission Risk Prevention Plan 05/29/2021  Transportation Screening Complete  Medication Review [name] Complete  PCP or Specialist appointment within 3-5 days of discharge Complete  HRI or Home Care Consult Complete  SW Recovery Care/Counseling Consult Complete  Palliative Care Screening Not Applicable  Skilled Nursing Facility Not Applicable  Some recent data might be hidden

## 2021-05-29 NOTE — Discharge Summary (Signed)
Felicia Bradley HER:[id number] DOB: 1991-02-27 DOA: 05/26/2021  PCP: [facility]  Admit date: 05/26/2021 Discharge date: 05/29/2021  Admitted From: home Disposition:  home  Recommendations for Outpatient Follow-up:  Follow up with PCP in 1 week Please obtain BMP/CBC in one week       Discharge Condition:Stable CODE STATUS: Full Diet recommendation: carb control, low sodium Brief/Interim Summary: Per Felicia Bradley is a 30 y.o. female with medical history significant of DM2, COPD, bipolar, OSA. Presented with dyspnea. She reported that she's had dyspnea for 2 weeks. She initially had productive cough of green sputum. She had fevers up to 103. She was seen in the ED for this and given abx and steroids. Since leaving the ED, her she still had symptoms. She stated that her cough has changed to a more dry cough, but her breathing has not improved. She decided that to come back to the ED for help. She denied any other aggravating or alleviating factors   COPD exacerbation/bronchitis Started on IV steroids, duo nebs Has improved off of oxygen satting above 92% when I saw her Reports shortness of breath and cough has improved Will DC with steroid pack Continue finishing Z-Pak Inhalers  DM2 Continue home management     Bipolar  Continue home medications     GERD Continue PPI   OSA     - CPAP @ night   Morbid obesity     - she is working w/ [facility] bariatrics on potential surgery     - her coumadin has been held in anticipation of this surgery   Hx of PE/DVT      - last clot was in 2016      - she is holding her coumadin d/t recommendation of bariatrics w/ [facility] in anticipation of surgery per her report    Discharge Diagnoses:  Active Problems:   Acute asthma exacerbation    Discharge Instructions  Discharge Instructions     Call MD for:  difficulty breathing, headache or visual disturbances   Complete by: As directed    Diet - low sodium heart healthy    Complete by: As directed    Discharge instructions   Complete by: As directed    F/u with pcp in one week   Increase activity slowly   Complete by: As directed       Allergies as of 05/29/2021       Reactions   Contrast Media [iodinated Diagnostic Agents] Anaphylaxis   Dilaudid [hydromorphone] Anaphylaxis   Tramadol Itching   Apple Hives   Banana Hives   Coconut Oil Hives, Itching   Dexamethasone Nausea And Vomiting, Hives   "feels like my pubic hair is on fire"   Other Hives   Zucchini and squash   Solu-medrol [methylprednisolone] Nausea And Vomiting   Toradol [ketorolac Tromethamine] Hives, Itching        Medication List     STOP taking these medications    acetaminophen 500 MG tablet Commonly known as: TYLENOL   budesonide-formoterol 160-4.5 MCG/ACT inhaler Commonly known as: SYMBICORT   doxycycline 100 MG capsule Commonly known as: VIBRAMYCIN   enoxaparin 100 mg/mL Soln injection Commonly known as: Lovenox   folic acid 1 MG tablet Commonly known as: FOLVITE   guaiFENesin-dextromethorphan 100-10 MG/5ML syrup Commonly known as: ROBITUSSIN DM   omeprazole 20 MG capsule Commonly known as: PRILOSEC   ondansetron 4 MG tablet Commonly known as: ZOFRAN   senna-docusate 8.6-50 MG tablet Commonly known as: Senokot-S  TAKE these medications    albuterol 108 (90 Base) MCG/ACT inhaler Commonly known as: VENTOLIN HFA Inhale 1 puff into the lungs 2 (two) times daily as needed for wheezing or shortness of breath.   ALPRAZolam 0.5 MG tablet Commonly known as: XANAX Take 0.5 mg by mouth 3 (three) times daily.   ARIPiprazole 2 MG tablet Commonly known as: ABILIFY Take 2 mg by mouth daily.   aspirin EC 81 MG tablet Take 81 mg by mouth daily. Swallow whole.   azithromycin 250 MG tablet Commonly known as: Zithromax Take 1 tablet (250 mg total) by mouth daily for 4 days. Start taking on: May 30, 2021   benzonatate 100 MG capsule Commonly  known as: TESSALON Take 1 capsule (100 mg total) by mouth 3 (three) times daily as needed for up to 7 days for cough.   EPINEPHrine 0.3 mg/0.3 mL Soaj injection Commonly known as: EPI-PEN Inject 0.3 mg into the muscle as needed for anaphylaxis.   fluticasone-salmeterol 250-50 MCG/ACT Aepb Commonly known as: ADVAIR Inhale 1 puff into the lungs in the morning and at bedtime.   gabapentin 300 MG capsule Commonly known as: NEURONTIN Take 300 mg by mouth 3 (three) times daily.   ipratropium-albuterol 0.5-2.5 (3) MG/3ML Soln Commonly known as: DUONEB Take 3 mLs by nebulization in the morning, at noon, and at bedtime.   multivitamin with minerals Tabs tablet Take 1 tablet by mouth daily.   oxyCODONE-acetaminophen 10-325 MG tablet Commonly known as: PERCOCET Take 1 tablet by mouth 3 (three) times daily.   pantoprazole 40 MG tablet Commonly known as: PROTONIX Take 1 tablet (40 mg total) by mouth daily. Start taking on: May 30, 2021   predniSONE 20 MG tablet Commonly known as: DELTASONE Take 1 tablet (20 mg total) by mouth daily with breakfast for 5 doses. Start taking on: May 30, 2021       ASK your doctor about these medications    diclofenac Sodium 1 % Gel Commonly known as: VOLTAREN Apply 2 g topically 4 (four) times daily.        Follow-up Information     [place]. Go on 07/16/2021.   Specialty: Internal Medicine Why: Lucio Edward follow up appointment   Wed 3rd at  8:00am Contact information: [address] [place] [address] [phone]                Allergies  Allergen Reactions   Contrast Media [Iodinated Diagnostic Agents] Anaphylaxis   Dilaudid [Hydromorphone] Anaphylaxis   Tramadol Itching   Apple Hives   Banana Hives   Coconut Oil Hives and Itching   Dexamethasone Nausea And Vomiting and Hives    "feels like my pubic hair is on fire"   Other Hives    Zucchini and squash   Solu-Medrol [Methylprednisolone]  Nausea And Vomiting   Toradol [Ketorolac Tromethamine] Hives and Itching    Consultations:    Procedures/Studies: DG Chest 2 View  Result Date: 05/19/2021 CLINICAL DATA:  Shortness of breath with cough EXAM: CHEST - 2 VIEW COMPARISON:  Apr 19, 2021 FINDINGS: There is an area of ill-defined opacity in the right upper lobe anteriorly, a likely focus of pneumonia. Lungs elsewhere clear. Heart size and pulmonary vascularity are normal. No adenopathy. No bone lesions. IMPRESSION: Ill-defined airspace opacity right upper lobe anteriorly, likely a focus of pneumonia. Lungs elsewhere clear. Cardiac silhouette normal. Electronically Signed   By: Bretta Bang III M.D.   On: 05/19/2021 15:51  DG Chest Port 1 View  Result Date: 05/26/2021 CLINICAL DATA:  30 year old female with cough and asthma. EXAM: PORTABLE CHEST 1 VIEW COMPARISON:  Chest radiograph dated 05/19/2021 FINDINGS: Shallow inspiration. No focal consolidation, pleural effusion, or pneumothorax. The cardiac silhouette is within limits. No acute osseous pathology. IMPRESSION: No active disease. Electronically Signed   By: Elgie Collard M.D.   On: 05/26/2021 22:54      Subjective: Has no shortness of breath, chest pain.  Feels much better.  Discharge Exam: Vitals:   05/29/21 0055 05/29/21 0739  BP: 124/82   Pulse: 91   Resp: 20   Temp: 98.3 F (36.8 C)   SpO2: 97% 98%   Vitals:   05/28/21 2122 05/28/21 2157 05/29/21 0055 05/29/21 0739  BP:   124/82   Pulse:   91   Resp: (!) Temp:   98.3 F (36.8 C)   TempSrc:   Oral   SpO2:   97% 98%  Weight:      Height:        General: Pt is alert, awake, not in acute distress Cardiovascular: RRR, S1/S2 +, no rubs, no gallops Respiratory: CTA bilaterally, no wheezing, no rhonchi Abdominal: Soft, NT, ND, bowel sounds + Extremities: no edema    The results of significant diagnostics from this hospitalization (including imaging, microbiology, ancillary and  laboratory) are listed below for reference.     Microbiology: Recent Results (from the past 240 hour(s))  Group A Strep by PCR     Status: None   Collection Time: 05/19/21  3:29 PM   Specimen: Throat; Sterile Swab  Result Value Ref Range Status   Group A Strep by PCR NOT DETECTED NOT DETECTED Final    Comment: Performed at [facility], [address] [place], [place] [address]  Resp Panel by RT-PCR (Flu A&B, Covid) Nasopharyngeal Swab     Status: None   Collection Time: 05/19/21  3:54 PM   Specimen: Nasopharyngeal Swab; Nasopharyngeal(NP) swabs in vial transport medium  Result Value Ref Range Status   SARS Coronavirus 2 by RT PCR NEGATIVE NEGATIVE Final    Comment: (NOTE) SARS-CoV-2 target nucleic acids are NOT DETECTED.  The SARS-CoV-2 RNA is generally detectable in upper respiratory specimens during the acute phase of infection. The lowest concentration of SARS-CoV-2 viral copies this assay can detect is 138 copies/mL. A negative result does not preclude SARS-Cov-2 infection and should not be used as the sole basis for treatment or other patient management decisions. A negative result may occur with  improper specimen collection/handling, submission of specimen other than nasopharyngeal swab, presence of viral mutation(s) within the areas targeted by this assay, and inadequate number of viral copies(<138 copies/mL). A negative result must be combined with clinical observations, patient history, and epidemiological information. The expected result is Negative.  Fact Sheet for Patients:  BloggerCourse.com  Fact Sheet for Healthcare Providers:  SeriousBroker.it  This test is no t yet approved or cleared by the [place] FDA and  has been authorized for detection and/or diagnosis of SARS-CoV-2 by FDA under an Emergency Use Authorization (EUA). This EUA will remain  in effect (meaning this test can be  used) for the duration of the COVID-19 declaration under Section 564(b)(1) of the Act, 21 U.S.C.section 360bbb-3(b)(1), unless the authorization is terminated  or revoked sooner.       Influenza A by PCR NEGATIVE NEGATIVE Final   Influenza B by PCR NEGATIVE NEGATIVE Final  Comment: (NOTE) The Xpert Xpress SARS-CoV-2/FLU/RSV plus assay is intended as an aid in the diagnosis of influenza from Nasopharyngeal swab specimens and should not be used as a sole basis for treatment. Nasal washings and aspirates are unacceptable for Xpert Xpress SARS-CoV-2/FLU/RSV testing.  Fact Sheet for Patients: BloggerCourse.com  Fact Sheet for Healthcare Providers: SeriousBroker.it  This test is not yet approved or cleared by the [place] FDA and has been authorized for detection and/or diagnosis of SARS-CoV-2 by FDA under an Emergency Use Authorization (EUA). This EUA will remain in effect (meaning this test can be used) for the duration of the COVID-19 declaration under Section 564(b)(1) of the Act, 21 U.S.C. section 360bbb-3(b)(1), unless the authorization is terminated or revoked.  Performed at [facility], [address] [place], [place] [address]   Resp Panel by RT-PCR (Flu A&B, Covid) Nasopharyngeal Swab     Status: None   Collection Time: 05/27/21  3:44 AM   Specimen: Nasopharyngeal Swab; Nasopharyngeal(NP) swabs in vial transport medium  Result Value Ref Range Status   SARS Coronavirus 2 by RT PCR NEGATIVE NEGATIVE Final    Comment: (NOTE) SARS-CoV-2 target nucleic acids are NOT DETECTED.  The SARS-CoV-2 RNA is generally detectable in upper respiratory specimens during the acute phase of infection. The lowest concentration of SARS-CoV-2 viral copies this assay can detect is 138 copies/mL. A negative result does not preclude SARS-Cov-2 infection and should not be used as the sole basis for treatment or other  patient management decisions. A negative result may occur with  improper specimen collection/handling, submission of specimen other than nasopharyngeal swab, presence of viral mutation(s) within the areas targeted by this assay, and inadequate number of viral copies(<138 copies/mL). A negative result must be combined with clinical observations, patient history, and epidemiological information. The expected result is Negative.  Fact Sheet for Patients:  BloggerCourse.com  Fact Sheet for Healthcare Providers:  SeriousBroker.it  This test is no t yet approved or cleared by the [place] FDA and  has been authorized for detection and/or diagnosis of SARS-CoV-2 by FDA under an Emergency Use Authorization (EUA). This EUA will remain  in effect (meaning this test can be used) for the duration of the COVID-19 declaration under Section 564(b)(1) of the Act, 21 U.S.C.section 360bbb-3(b)(1), unless the authorization is terminated  or revoked sooner.       Influenza A by PCR NEGATIVE NEGATIVE Final   Influenza B by PCR NEGATIVE NEGATIVE Final    Comment: (NOTE) The Xpert Xpress SARS-CoV-2/FLU/RSV plus assay is intended as an aid in the diagnosis of influenza from Nasopharyngeal swab specimens and should not be used as a sole basis for treatment. Nasal washings and aspirates are unacceptable for Xpert Xpress SARS-CoV-2/FLU/RSV testing.  Fact Sheet for Patients: BloggerCourse.com  Fact Sheet for Healthcare Providers: SeriousBroker.it  This test is not yet approved or cleared by the [place] FDA and has been authorized for detection and/or diagnosis of SARS-CoV-2 by FDA under an Emergency Use Authorization (EUA). This EUA will remain in effect (meaning this test can be used) for the duration of the COVID-19 declaration under Section 564(b)(1) of the Act, 21 U.S.C. section  360bbb-3(b)(1), unless the authorization is terminated or revoked.  Performed at [facility], [address] [place], [place] [address]      Labs: BNP (last 3 results) Recent Labs    03/24/21 0240 03/30/21 2009  BNP 22.7 27.9   Basic Metabolic Panel: Recent Labs  Lab 05/26/21 2255 05/27/21 1405  05/28/21 0521 05/29/21 0506  NA 144  --  139  --   K 3.9  --  5.0 4.5  CL 108  --  107  --   CO2 29  --  26  --   GLUCOSE 103*  --  235*  --   BUN 12  --  14  --   CREATININE 0.79 0.85 0.82  --   CALCIUM 9.3  --  9.4  --    Liver Function Tests: Recent Labs  Lab 05/28/21 0521  AST 15  ALT 18  ALKPHOS 50  BILITOT 0.1*  PROT 7.6  ALBUMIN 3.3*   No results for input(s): LIPASE, AMYLASE in the last 168 hours. No results for input(s): AMMONIA in the last 168 hours. CBC: Recent Labs  Lab 05/26/21 2255 05/27/21 1405 05/28/21 0521  WBC 8.4 7.6 13.3*  NEUTROABS 4.1  --   --   HGB 11.0* 12.2 11.5*  HCT 35.9* 40.5 38.6  MCV 86.7 88.0 89.8  PLT 343 365 326   Cardiac Enzymes: No results for input(s): CKTOTAL, CKMB, CKMBINDEX, TROPONINI in the last 168 hours. BNP: Invalid input(s): POCBNP CBG: Recent Labs  Lab 05/28/21 0803 05/28/21 1701 05/28/21 2105 05/29/21 0727 05/29/21 1158  GLUCAP 201* 201* 274* 141* 203*   D-Dimer Recent Labs    05/27/21 0353  DDIMER <0.27   Hgb A1c Recent Labs    05/27/21 1405  HGBA1C 6.9*   Lipid Profile No results for input(s): CHOL, HDL, LDLCALC, TRIG, CHOLHDL, LDLDIRECT in the last 72 hours. Thyroid function studies No results for input(s): TSH, T4TOTAL, T3FREE, THYROIDAB in the last 72 hours.  Invalid input(s): FREET3 Anemia work up No results for input(s): VITAMINB12, FOLATE, FERRITIN, TIBC, IRON, RETICCTPCT in the last 72 hours. Urinalysis    Component Value Date/Time   COLORURINE YELLOW 04/28/2021 0011   APPEARANCEUR CLEAR 04/28/2021 0011   LABSPEC 1.024 04/28/2021 0011   PHURINE 6.0  04/28/2021 0011   GLUCOSEU NEGATIVE 04/28/2021 0011   HGBUR NEGATIVE 04/28/2021 0011   BILIRUBINUR NEGATIVE 04/28/2021 0011   KETONESUR NEGATIVE 04/28/2021 0011   PROTEINUR NEGATIVE 04/28/2021 0011   UROBILINOGEN 0.2 12/04/2009 1301   NITRITE NEGATIVE 04/28/2021 0011   LEUKOCYTESUR TRACE (A) 04/28/2021 0011   Sepsis Labs Invalid input(s): PROCALCITONIN,  WBC,  LACTICIDVEN Microbiology Recent Results (from the past 240 hour(s))  Group A Strep by PCR     Status: None   Collection Time: 05/19/21  3:29 PM   Specimen: Throat; Sterile Swab  Result Value Ref Range Status   Group A Strep by PCR NOT DETECTED NOT DETECTED Final    Comment: Performed at [facility], [address], [place], [place] [address]  Resp Panel by RT-PCR (Flu A&B, Covid) Nasopharyngeal Swab     Status: None   Collection Time: 05/19/21  3:54 PM   Specimen: Nasopharyngeal Swab; Nasopharyngeal(NP) swabs in vial transport medium  Result Value Ref Range Status   SARS Coronavirus 2 by RT PCR NEGATIVE NEGATIVE Final    Comment: (NOTE) SARS-CoV-2 target nucleic acids are NOT DETECTED.  The SARS-CoV-2 RNA is generally detectable in upper respiratory specimens during the acute phase of infection. The lowest concentration of SARS-CoV-2 viral copies this assay can detect is 138 copies/mL. A negative result does not preclude SARS-Cov-2 infection and should not be used as the sole basis for treatment or other patient management decisions. A negative result may occur with  improper specimen collection/handling, submission of specimen other than nasopharyngeal swab,  presence of viral mutation(s) within the areas targeted by this assay, and inadequate number of viral copies(<138 copies/mL). A negative result must be combined with clinical observations, patient history, and epidemiological information. The expected result is Negative.  Fact Sheet for Patients:   BloggerCourse.com  Fact Sheet for Healthcare Providers:  SeriousBroker.it  This test is no t yet approved or cleared by the [place] FDA and  has been authorized for detection and/or diagnosis of SARS-CoV-2 by FDA under an Emergency Use Authorization (EUA). This EUA will remain  in effect (meaning this test can be used) for the duration of the COVID-19 declaration under Section 564(b)(1) of the Act, 21 U.S.C.section 360bbb-3(b)(1), unless the authorization is terminated  or revoked sooner.       Influenza A by PCR NEGATIVE NEGATIVE Final   Influenza B by PCR NEGATIVE NEGATIVE Final    Comment: (NOTE) The Xpert Xpress SARS-CoV-2/FLU/RSV plus assay is intended as an aid in the diagnosis of influenza from Nasopharyngeal swab specimens and should not be used as a sole basis for treatment. Nasal washings and aspirates are unacceptable for Xpert Xpress SARS-CoV-2/FLU/RSV testing.  Fact Sheet for Patients: BloggerCourse.com  Fact Sheet for Healthcare Providers: SeriousBroker.it  This test is not yet approved or cleared by the [place] FDA and has been authorized for detection and/or diagnosis of SARS-CoV-2 by FDA under an Emergency Use Authorization (EUA). This EUA will remain in effect (meaning this test can be used) for the duration of the COVID-19 declaration under Section 564(b)(1) of the Act, 21 U.S.C. section 360bbb-3(b)(1), unless the authorization is terminated or revoked.  Performed at [facility], [address] [place], [place] [address]   Resp Panel by RT-PCR (Flu A&B, Covid) Nasopharyngeal Swab     Status: None   Collection Time: 05/27/21  3:44 AM   Specimen: Nasopharyngeal Swab; Nasopharyngeal(NP) swabs in vial transport medium  Result Value Ref Range Status   SARS Coronavirus 2 by RT PCR NEGATIVE NEGATIVE Final    Comment:  (NOTE) SARS-CoV-2 target nucleic acids are NOT DETECTED.  The SARS-CoV-2 RNA is generally detectable in upper respiratory specimens during the acute phase of infection. The lowest concentration of SARS-CoV-2 viral copies this assay can detect is 138 copies/mL. A negative result does not preclude SARS-Cov-2 infection and should not be used as the sole basis for treatment or other patient management decisions. A negative result may occur with  improper specimen collection/handling, submission of specimen other than nasopharyngeal swab, presence of viral mutation(s) within the areas targeted by this assay, and inadequate number of viral copies(<138 copies/mL). A negative result must be combined with clinical observations, patient history, and epidemiological information. The expected result is Negative.  Fact Sheet for Patients:  BloggerCourse.com  Fact Sheet for Healthcare Providers:  SeriousBroker.it  This test is no t yet approved or cleared by the [place] FDA and  has been authorized for detection and/or diagnosis of SARS-CoV-2 by FDA under an Emergency Use Authorization (EUA). This EUA will remain  in effect (meaning this test can be used) for the duration of the COVID-19 declaration under Section 564(b)(1) of the Act, 21 U.S.C.section 360bbb-3(b)(1), unless the authorization is terminated  or revoked sooner.       Influenza A by PCR NEGATIVE NEGATIVE Final   Influenza B by PCR NEGATIVE NEGATIVE Final    Comment: (NOTE) The Xpert Xpress SARS-CoV-2/FLU/RSV plus assay is intended as an aid in the diagnosis of influenza from Nasopharyngeal swab specimens and should not  be used as a sole basis for treatment. Nasal washings and aspirates are unacceptable for Xpert Xpress SARS-CoV-2/FLU/RSV testing.  Fact Sheet for Patients: BloggerCourse.com  Fact Sheet for Healthcare  Providers: SeriousBroker.it  This test is not yet approved or cleared by the [place] FDA and has been authorized for detection and/or diagnosis of SARS-CoV-2 by FDA under an Emergency Use Authorization (EUA). This EUA will remain in effect (meaning this test can be used) for the duration of the COVID-19 declaration under Section 564(b)(1) of the Act, 21 U.S.C. section 360bbb-3(b)(1), unless the authorization is terminated or revoked.  Performed at [facility], [address], [place], [place] [address]      Time coordinating discharge: Over 30 minutes  SIGNED:   Lynn Ito, MD  [facility] 05/29/2021, 1:21 PM Pager   If 7PM-7AM, please contact night-coverage [email or web address] Password [email or web address]

## 2021-06-03 LAB — GLUCOSE, CAPILLARY: Glucose-Capillary: 129 mg/dL — ABNORMAL HIGH (ref 70–99)

## 2021-07-02 ENCOUNTER — Emergency Department (HOSPITAL_COMMUNITY)
Admission: EM | Admit: 2021-07-02 | Discharge: 2021-07-02 | Disposition: A | Discharge status: Home / Self Care | Payer: Medicaid Other

## 2021-07-02 ENCOUNTER — Other Ambulatory Visit: Payer: Self-pay

## 2021-07-02 DIAGNOSIS — R079 Chest pain, unspecified: Secondary | ICD-10-CM | POA: Diagnosis not present

## 2021-07-02 DIAGNOSIS — Z5321 Procedure and treatment not carried out due to patient leaving prior to being seen by health care provider: Secondary | ICD-10-CM | POA: Insufficient documentation

## 2021-07-02 DIAGNOSIS — Z20822 Contact with and (suspected) exposure to covid-19: Secondary | ICD-10-CM | POA: Insufficient documentation

## 2021-07-02 DIAGNOSIS — I509 Heart failure, unspecified: Secondary | ICD-10-CM | POA: Insufficient documentation

## 2021-07-02 DIAGNOSIS — R0602 Shortness of breath: Secondary | ICD-10-CM | POA: Insufficient documentation

## 2021-07-03 ENCOUNTER — Emergency Department (HOSPITAL_COMMUNITY): Payer: Medicaid Other

## 2021-07-03 ENCOUNTER — Emergency Department (HOSPITAL_COMMUNITY)
Admission: EM | Admit: 2021-07-03 | Discharge: 2021-07-03 | Disposition: A | Discharge status: Home / Self Care | Payer: Medicaid Other | Attending: Emergency Medicine | Admitting: Emergency Medicine

## 2021-07-03 LAB — CBC WITH DIFFERENTIAL/PLATELET
Abs Immature Granulocytes: 0.02 10*3/uL (ref 0.00–0.07)
Basophils Absolute: 0.1 10*3/uL (ref 0.0–0.1)
Basophils Relative: 1 %
Eosinophils Absolute: 0.1 10*3/uL (ref 0.0–0.5)
Eosinophils Relative: 1 %
HCT: 36.6 % (ref 36.0–46.0)
Hemoglobin: 10.7 g/dL — ABNORMAL LOW (ref 12.0–15.0)
Immature Granulocytes: 0 %
Lymphocytes Relative: 47 %
Lymphs Abs: 3.6 10*3/uL (ref 0.7–4.0)
MCH: 26.4 pg (ref 26.0–34.0)
MCHC: 29.2 g/dL — ABNORMAL LOW (ref 30.0–36.0)
MCV: 90.1 fL (ref 80.0–100.0)
Monocytes Absolute: 0.4 10*3/uL (ref 0.1–1.0)
Monocytes Relative: 5 %
Neutro Abs: 3.6 10*3/uL (ref 1.7–7.7)
Neutrophils Relative %: 46 %
Platelets: 342 10*3/uL (ref 150–400)
RBC: 4.06 MIL/uL (ref 3.87–5.11)
RDW: 16.1 % — ABNORMAL HIGH (ref 11.5–15.5)
WBC: 7.8 10*3/uL (ref 4.0–10.5)
nRBC: 0 % (ref 0.0–0.2)

## 2021-07-03 LAB — COMPREHENSIVE METABOLIC PANEL
ALT: 15 U/L (ref 0–44)
AST: 15 U/L (ref 15–41)
Albumin: 3.1 g/dL — ABNORMAL LOW (ref 3.5–5.0)
Alkaline Phosphatase: 66 U/L (ref 38–126)
Anion gap: 7 (ref 5–15)
BUN: 10 mg/dL (ref 6–20)
CO2: 26 mmol/L (ref 22–32)
Calcium: 9 mg/dL (ref 8.9–10.3)
Chloride: 107 mmol/L (ref 98–111)
Creatinine, Ser: 0.72 mg/dL (ref 0.44–1.00)
GFR, Estimated: >60 mL/min (ref >60)
Glucose, Bld: 106 mg/dL — ABNORMAL HIGH (ref 70–99)
Potassium: 3.8 mmol/L (ref 3.5–5.1)
Sodium: 140 mmol/L (ref 135–145)
Total Bilirubin: 0.2 mg/dL — ABNORMAL LOW (ref 0.3–1.2)
Total Protein: 7 g/dL (ref 6.5–8.1)

## 2021-07-03 LAB — RESP PANEL BY RT-PCR (FLU A&B, COVID) ARPGX2
Influenza A by PCR: NEGATIVE
Influenza B by PCR: NEGATIVE
SARS Coronavirus 2 by RT PCR: NEGATIVE

## 2021-07-03 LAB — BRAIN NATRIURETIC PEPTIDE: B Natriuretic Peptide: 24.7 pg/mL (ref 0.0–100.0)

## 2021-07-03 LAB — TROPONIN I (HIGH SENSITIVITY)
Troponin I (High Sensitivity): 3 ng/L (ref <18)
Troponin I (High Sensitivity): 3 ng/L (ref <18)

## 2021-07-03 IMAGING — CR DG CHEST 2V
2 series · 2 of 2 positions shown · non-contrast
Comparison: [DATE]

CLINICAL DATA: Dyspnea

EXAM:
CHEST - 2 VIEW

[chest pa]
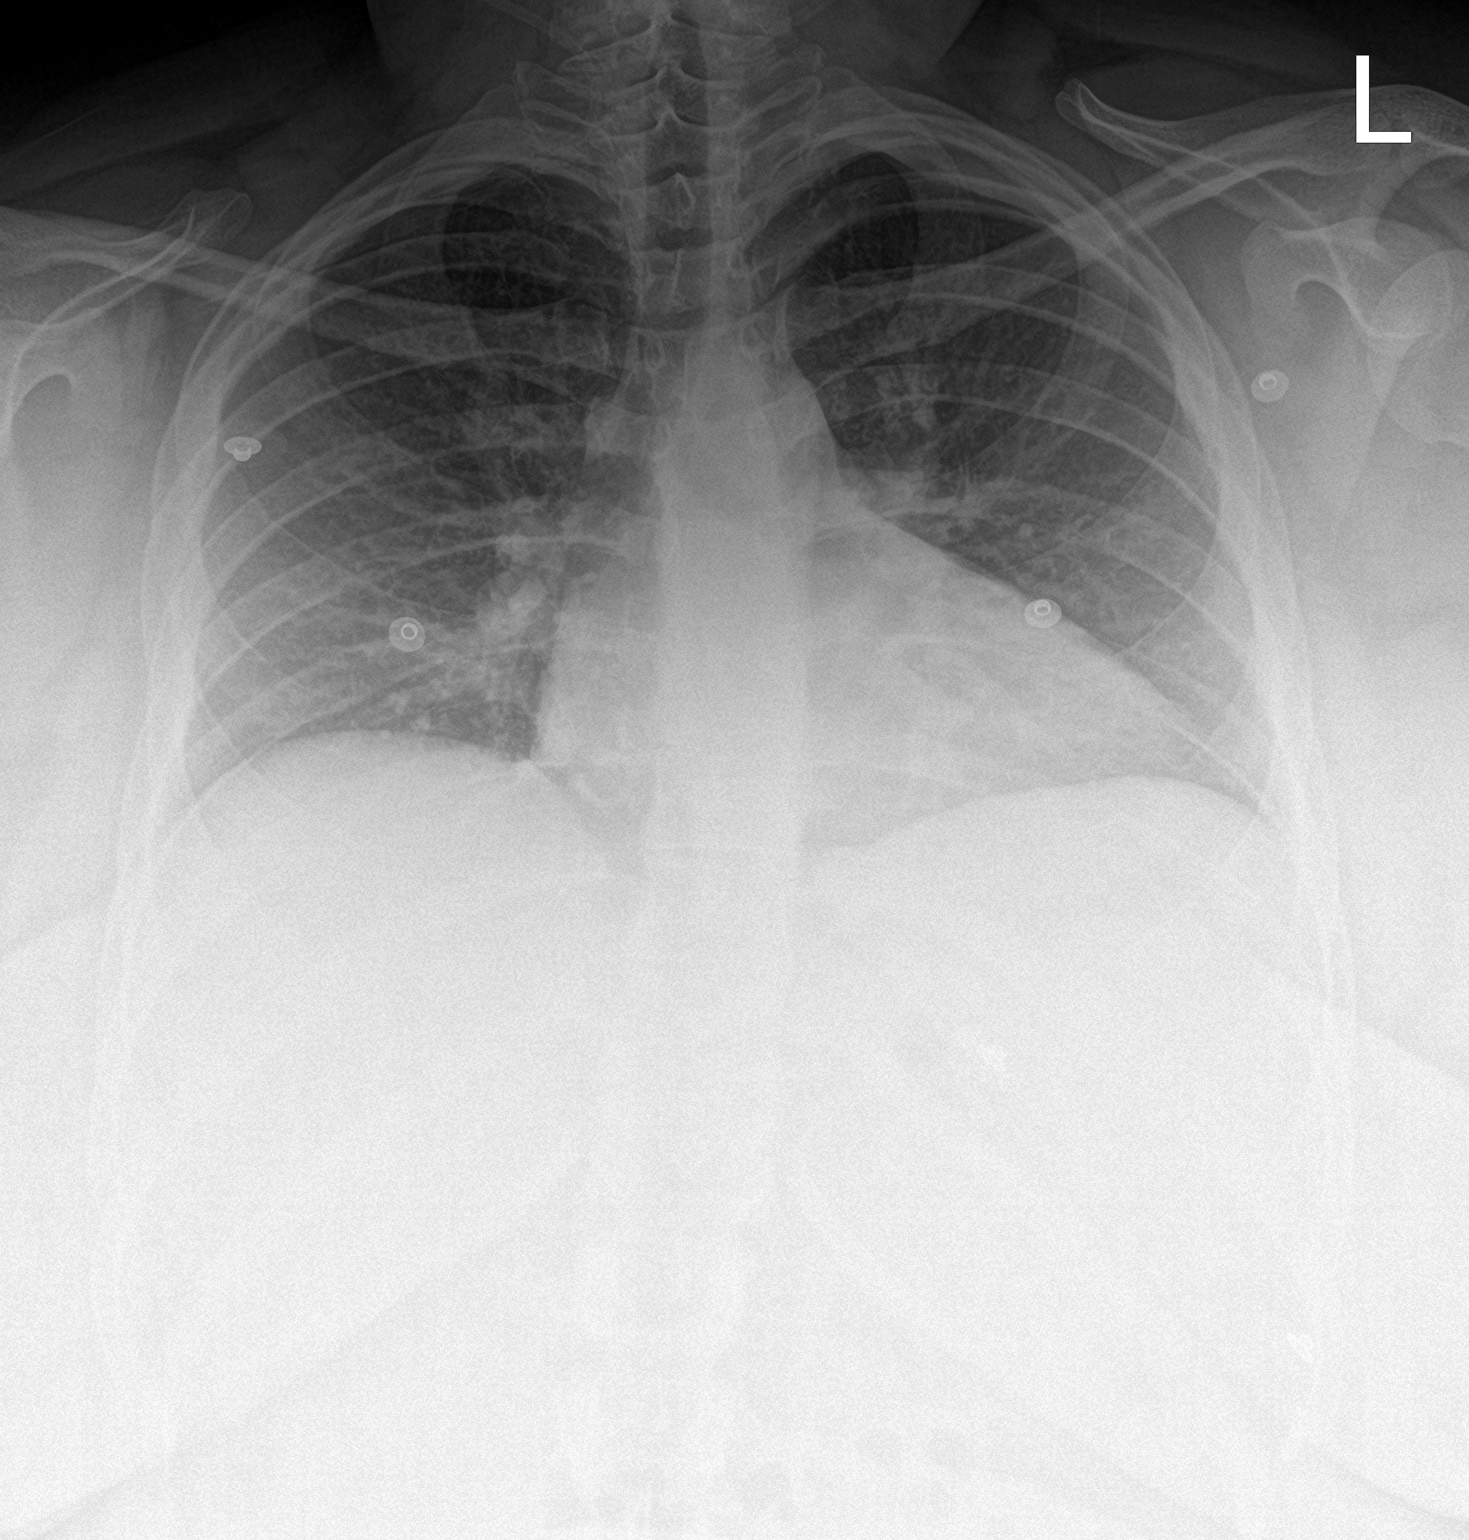

[chest lat]
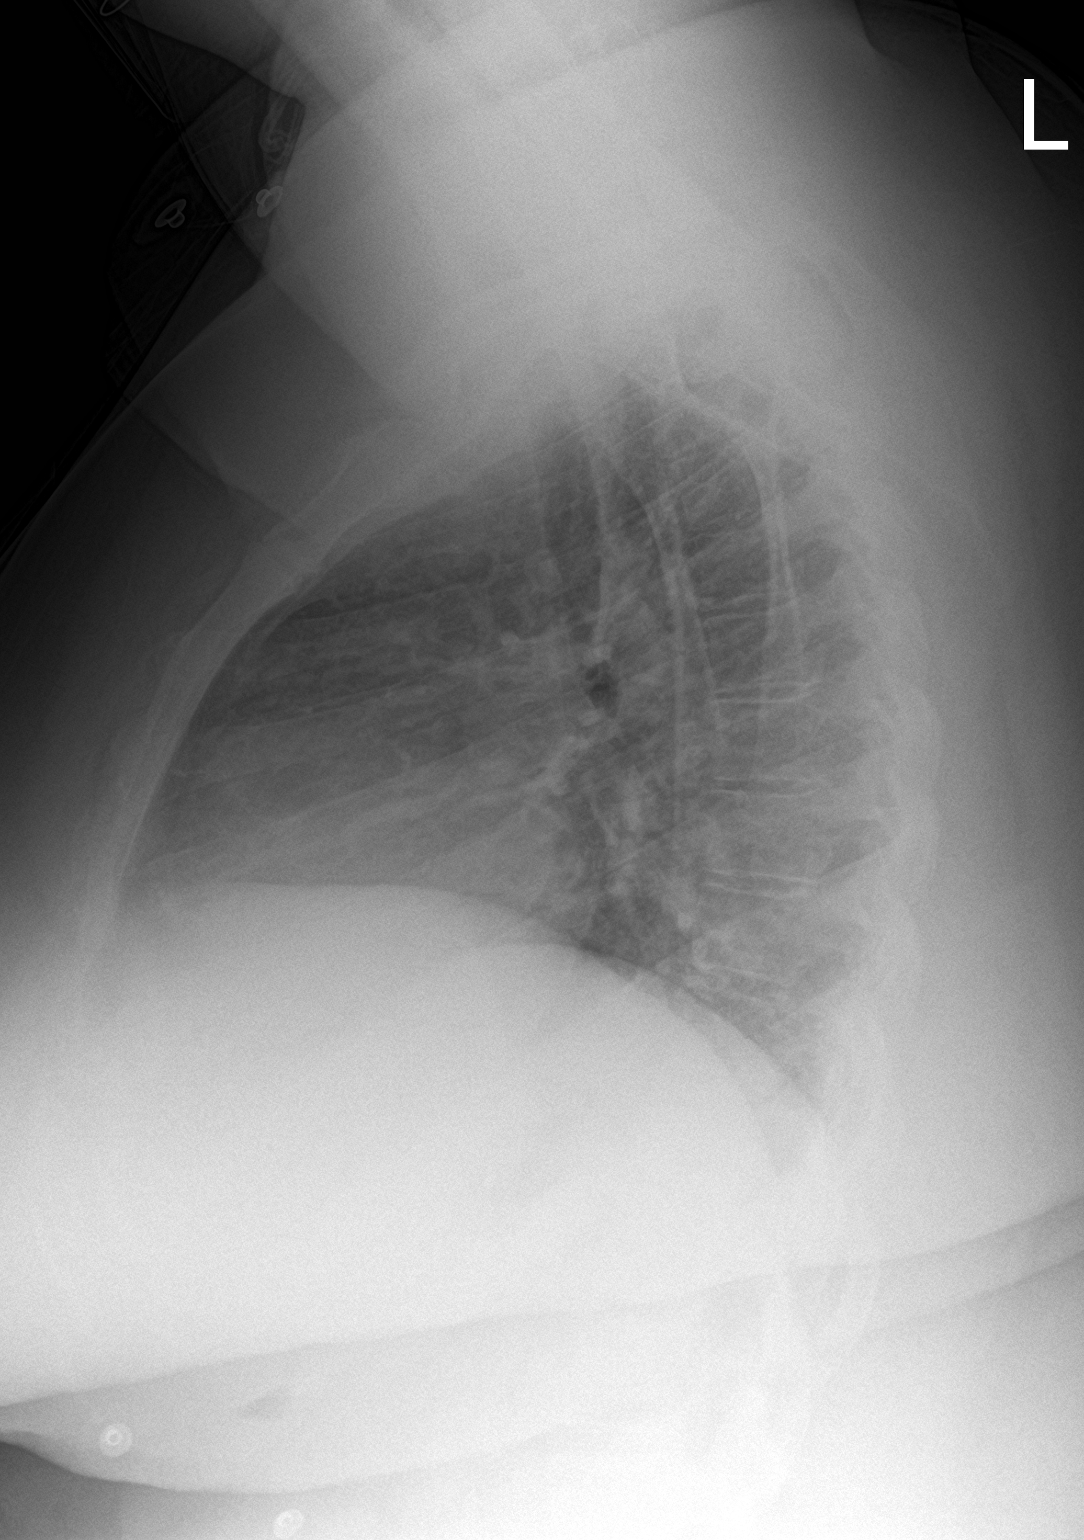

[2 of 2 positions shown; findings below may reference images not displayed]

FINDINGS: Lungs volumes are small, but are symmetric and are clear. Pulmonary
insufflation is stable since prior examination. No pneumothorax or
pleural effusion. Cardiac size within normal limits. Pulmonary
vascularity is normal. Osseous structures are age-appropriate. No
acute bone abnormality.
IMPRESSION: Stable pulmonary hypoinflation.  No active cardiopulmonary disease.

## 2021-07-03 NOTE — ED Triage Notes (Signed)
Chest pain and SOB started today. Pt was recently admitted. Hx of COPD and COVID 4x.   Usually on 3lpm via .

## 2021-07-03 NOTE — ED Notes (Signed)
Patient has left due to wait Time

## 2021-07-03 NOTE — ED Notes (Signed)
Patient states she is getting her significant other to bring her personal oxygen tank so she can leave d/t wait time

## 2021-07-03 NOTE — ED Provider Notes (Signed)
MSE was initiated and I personally evaluated the patient and placed orders (if any) at  12:12 AM on July 03, 2021.  Patient to ED with SOB and CP today. H/O CHF and feels this is what is going on. Compliant with medications. No fever. On 3L O2 continuously.   Today's Vitals   07/02/21 2335 07/03/21 0010  BP: (!) 124/101   Pulse: 94   Resp: (!) 24   Temp: 99.4 F (37.4 C)   TempSrc: Oral   SpO2: 96%   Weight:  (!) 140.6 kg  Height:  5\' 3"  (1.6 m)  PainSc:  8    Body mass index is 54.91 kg/m.  Appears SOB, O2 sat 96% on 3L Lungs without rales, ?wheezes on expiration.  Morbid obesity No LE edema  The patient appears stable so that the remainder of the MSE may be completed by another provider.   , PA-C 07/03/21 0014    07/05/21, MD 07/03/21 [phone]

## 2021-07-03 NOTE — ED Notes (Signed)
Pt oxygen tank changed 

## 2021-07-27 ENCOUNTER — Emergency Department (HOSPITAL_BASED_OUTPATIENT_CLINIC_OR_DEPARTMENT_OTHER): Payer: Medicaid Other

## 2021-07-27 ENCOUNTER — Emergency Department (HOSPITAL_BASED_OUTPATIENT_CLINIC_OR_DEPARTMENT_OTHER)
Admission: EM | Admit: 2021-07-27 | Discharge: 2021-07-27 | Disposition: A | Discharge status: Home / Self Care | Payer: Medicaid Other | Attending: Emergency Medicine | Admitting: Emergency Medicine

## 2021-07-27 ENCOUNTER — Other Ambulatory Visit: Payer: Self-pay

## 2021-07-27 ENCOUNTER — Encounter (HOSPITAL_BASED_OUTPATIENT_CLINIC_OR_DEPARTMENT_OTHER): Payer: Self-pay

## 2021-07-27 DIAGNOSIS — Z86711 Personal history of pulmonary embolism: Secondary | ICD-10-CM | POA: Insufficient documentation

## 2021-07-27 DIAGNOSIS — Z86718 Personal history of other venous thrombosis and embolism: Secondary | ICD-10-CM | POA: Diagnosis not present

## 2021-07-27 DIAGNOSIS — I129 Hypertensive chronic kidney disease with stage 1 through stage 4 chronic kidney disease, or unspecified chronic kidney disease: Secondary | ICD-10-CM | POA: Diagnosis not present

## 2021-07-27 DIAGNOSIS — E1122 Type 2 diabetes mellitus with diabetic chronic kidney disease: Secondary | ICD-10-CM | POA: Insufficient documentation

## 2021-07-27 DIAGNOSIS — Z79899 Other long term (current) drug therapy: Secondary | ICD-10-CM | POA: Diagnosis not present

## 2021-07-27 DIAGNOSIS — Z7982 Long term (current) use of aspirin: Secondary | ICD-10-CM | POA: Insufficient documentation

## 2021-07-27 DIAGNOSIS — N189 Chronic kidney disease, unspecified: Secondary | ICD-10-CM | POA: Diagnosis not present

## 2021-07-27 DIAGNOSIS — R109 Unspecified abdominal pain: Secondary | ICD-10-CM | POA: Diagnosis not present

## 2021-07-27 DIAGNOSIS — R112 Nausea with vomiting, unspecified: Secondary | ICD-10-CM

## 2021-07-27 DIAGNOSIS — Z8616 Personal history of COVID-19: Secondary | ICD-10-CM | POA: Diagnosis not present

## 2021-07-27 DIAGNOSIS — Z7951 Long term (current) use of inhaled steroids: Secondary | ICD-10-CM | POA: Diagnosis not present

## 2021-07-27 LAB — CBC
HCT: 37.6 % (ref 36.0–46.0)
Hemoglobin: 11.4 g/dL — ABNORMAL LOW (ref 12.0–15.0)
MCH: 26.6 pg (ref 26.0–34.0)
MCHC: 30.3 g/dL (ref 30.0–36.0)
MCV: 87.6 fL (ref 80.0–100.0)
Platelets: 369 10*3/uL (ref 150–400)
RBC: 4.29 MIL/uL (ref 3.87–5.11)
RDW: 16.7 % — ABNORMAL HIGH (ref 11.5–15.5)
WBC: 8.9 10*3/uL (ref 4.0–10.5)
nRBC: 0 % (ref 0.0–0.2)

## 2021-07-27 LAB — COMPREHENSIVE METABOLIC PANEL
ALT: 13 U/L (ref 0–44)
AST: 20 U/L (ref 15–41)
Albumin: 3.7 g/dL (ref 3.5–5.0)
Alkaline Phosphatase: 63 U/L (ref 38–126)
Anion gap: 7 (ref 5–15)
BUN: 14 mg/dL (ref 6–20)
CO2: 29 mmol/L (ref 22–32)
Calcium: 9 mg/dL (ref 8.9–10.3)
Chloride: 107 mmol/L (ref 98–111)
Creatinine, Ser: 0.9 mg/dL (ref 0.44–1.00)
GFR, Estimated: >60 mL/min (ref >60)
Glucose, Bld: 101 mg/dL — ABNORMAL HIGH (ref 70–99)
Potassium: 4.1 mmol/L (ref 3.5–5.1)
Sodium: 143 mmol/L (ref 135–145)
Total Bilirubin: 0.5 mg/dL (ref 0.3–1.2)
Total Protein: 7.9 g/dL (ref 6.5–8.1)

## 2021-07-27 LAB — URINALYSIS, ROUTINE W REFLEX MICROSCOPIC
Bilirubin Urine: NEGATIVE
Glucose, UA: NEGATIVE mg/dL
Ketones, ur: NEGATIVE mg/dL
Leukocytes,Ua: NEGATIVE
Nitrite: NEGATIVE
Protein, ur: NEGATIVE mg/dL
Specific Gravity, Urine: >1.03 — ABNORMAL HIGH (ref 1.005–1.030)
pH: 6 (ref 5.0–8.0)

## 2021-07-27 LAB — URINALYSIS, MICROSCOPIC (REFLEX)

## 2021-07-27 LAB — PREGNANCY, URINE: Preg Test, Ur: NEGATIVE

## 2021-07-27 LAB — LIPASE, BLOOD: Lipase: 27 U/L (ref 11–51)

## 2021-07-27 IMAGING — CT CT RENAL STONE PROTOCOL
2 of 4 series · 17 of 46 positions shown, 19 images · non-contrast
Comparison: [DATE]

CLINICAL DATA: Left flank pain x3 days.

EXAM:
CT ABDOMEN AND PELVIS WITHOUT CONTRAST
TECHNIQUE: Multidetector CT imaging of the abdomen and pelvis was performed
following the standard protocol without IV contrast.

[Series 2: axial st · axial · 0.98mm/px · z∈[-391,+59]mm · 14 of 98 slices shown, 16 images]
[im 4/98  soft-tissue]
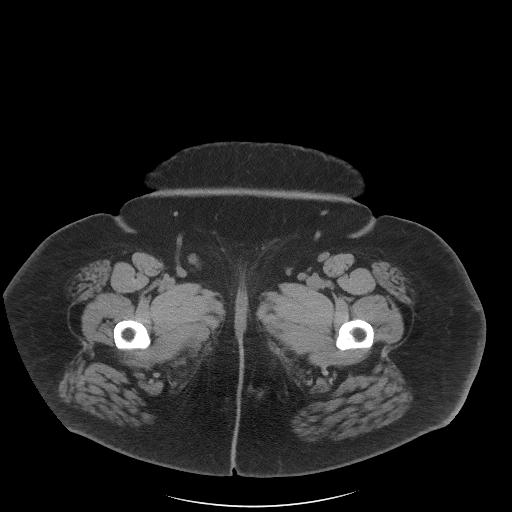
[im 4/98  bone]
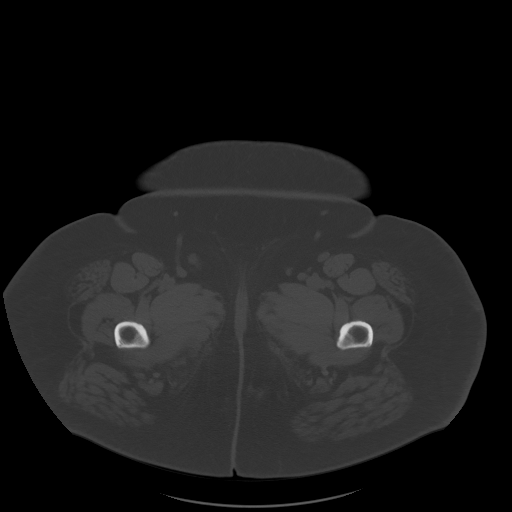
[im 12/98  soft-tissue]
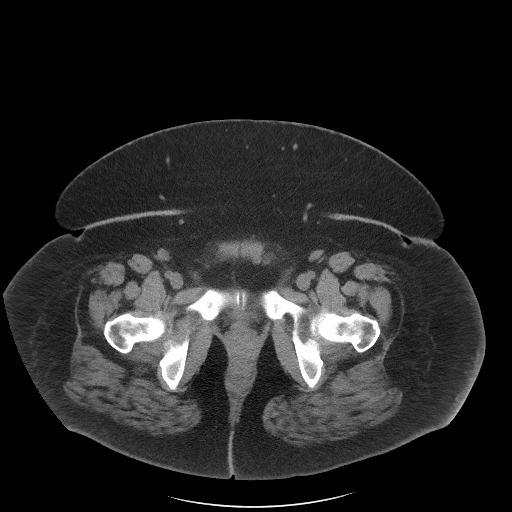
[im 20/98  soft-tissue]
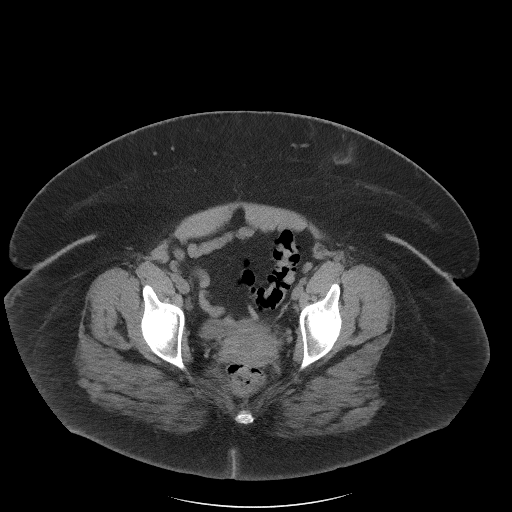
[im 28/98  soft-tissue]
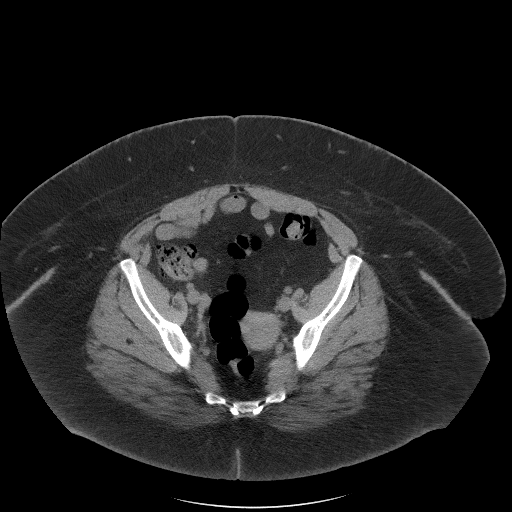
[im 32/98  soft-tissue]
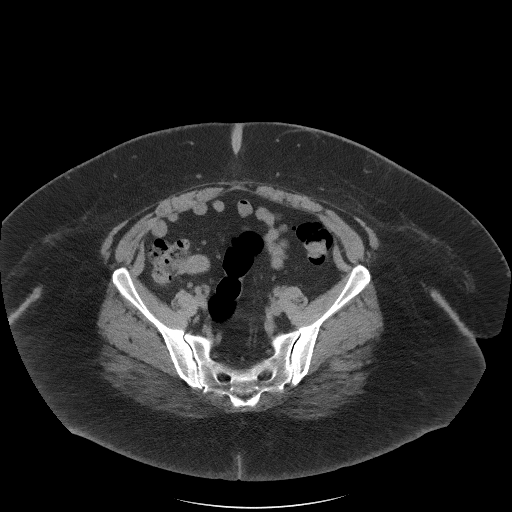
[im 39/98  soft-tissue]
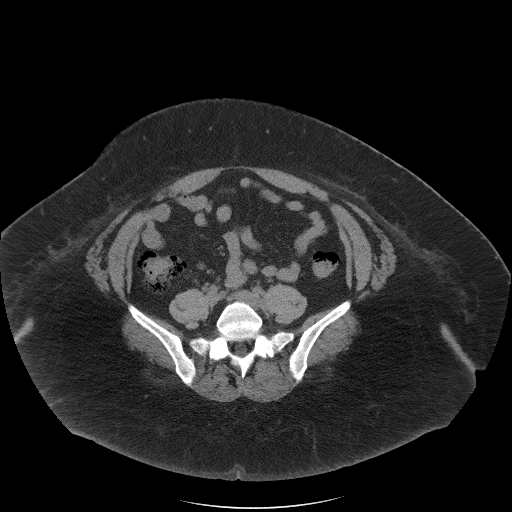
[im 47/98  soft-tissue]
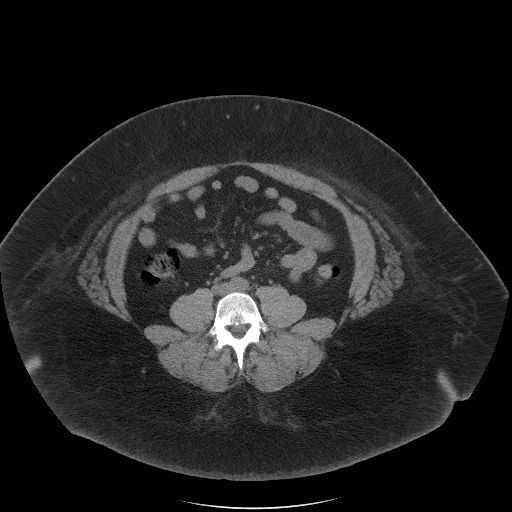
[im 51/98  soft-tissue]
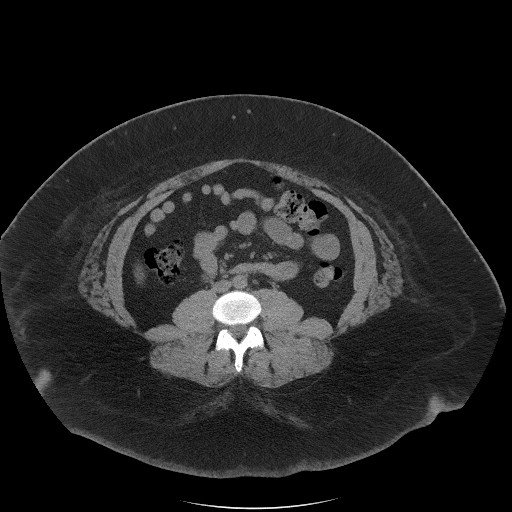
[im 59/98  soft-tissue]
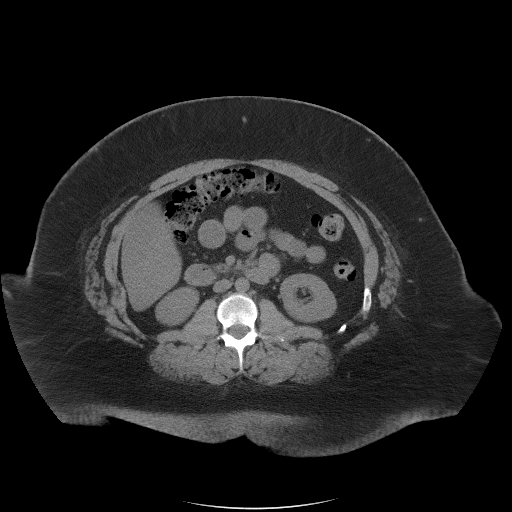
[im 59/98  bone]
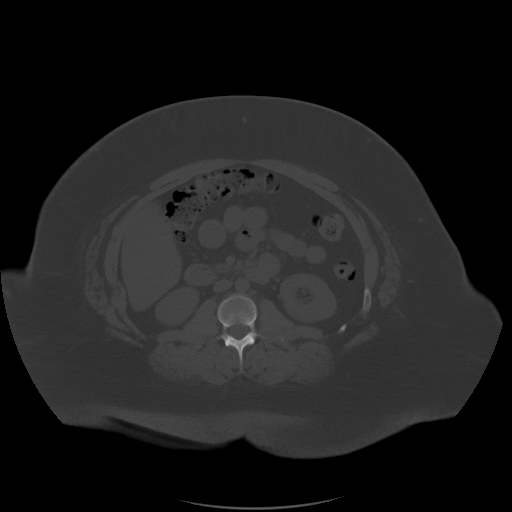
[im 66/98  soft-tissue]
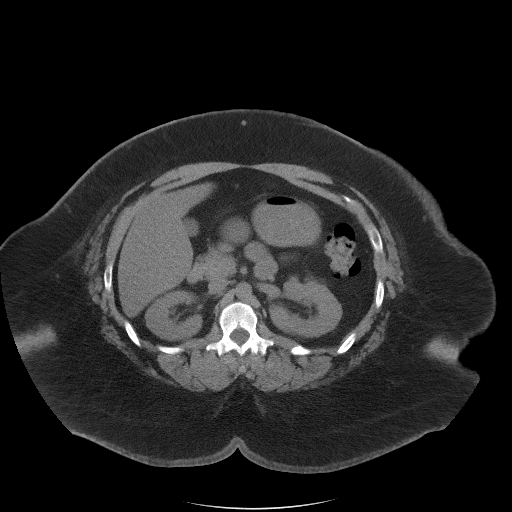
[im 74/98  soft-tissue]
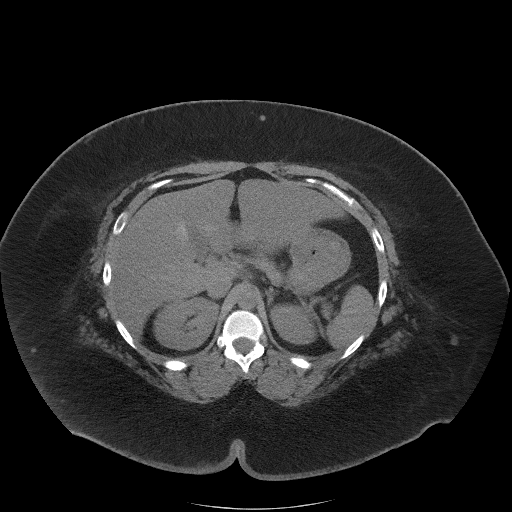
[im 78/98  soft-tissue]
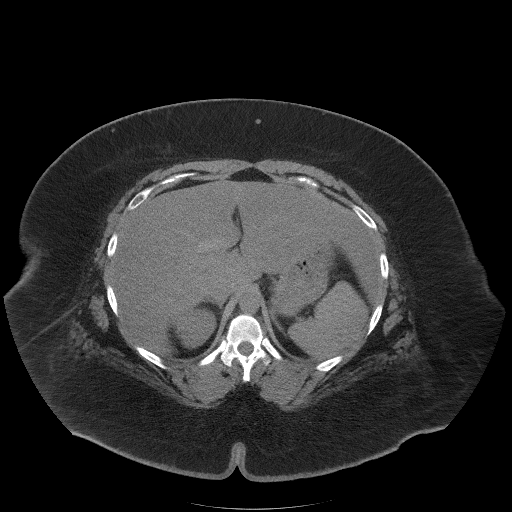
[im 86/98  soft-tissue]
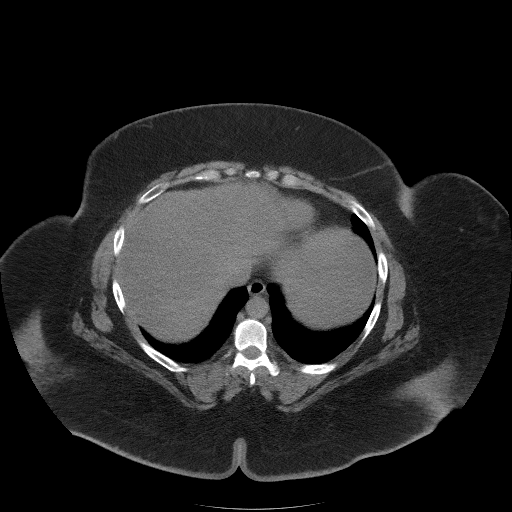
[im 94/98  soft-tissue]
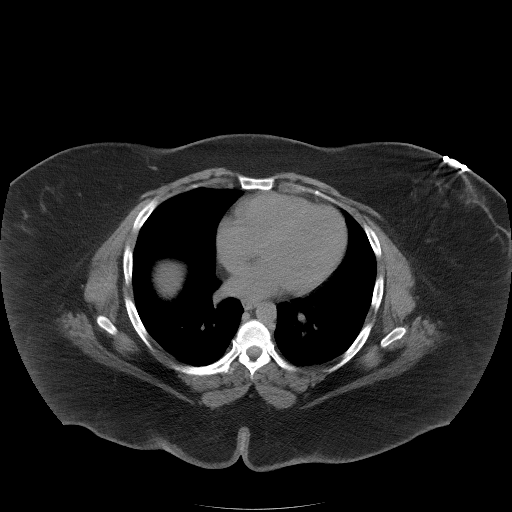

[Series 5: coronal st · coronal · 0.95mm/px · 3 of 123 slices shown]
[im 41/123  soft-tissue]
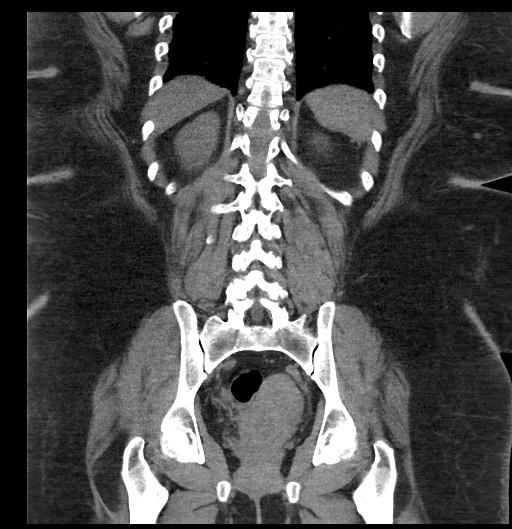
[im 55/123  soft-tissue]
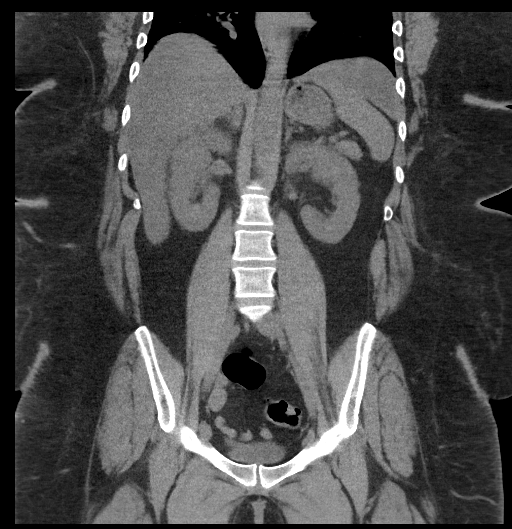
[im 68/123  soft-tissue]
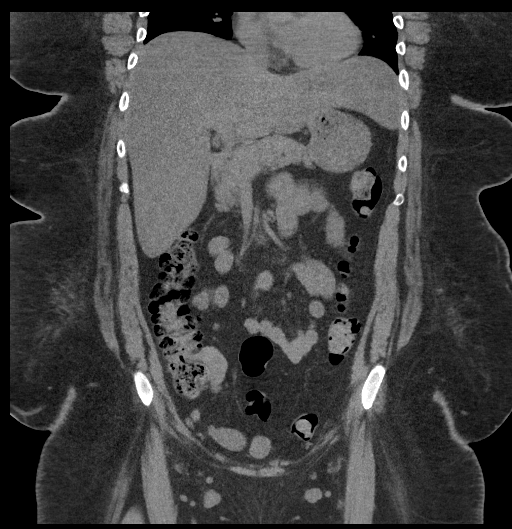

[17 of 46 positions shown; findings below may reference images not displayed]

FINDINGS: Lower chest: No acute abnormality.

Hepatobiliary: There is diffuse fatty infiltration of the liver
parenchyma. No focal liver abnormality is seen. No gallstones,
gallbladder wall thickening, or biliary dilatation.

Pancreas: Unremarkable. No pancreatic ductal dilatation or
surrounding inflammatory changes.

Spleen: Normal in size without focal abnormality.

Adrenals/Urinary Tract: Adrenal glands are unremarkable. Kidneys are
normal, without renal calculi, focal lesion, or hydronephrosis. The
urinary bladder is partially contracted and subsequently limited in
evaluation.

Stomach/Bowel: Stomach is within normal limits. Appendix appears
normal. No evidence of bowel dilatation. Noninflamed diverticula are
seen within the proximal to mid sigmoid colon.

Vascular/Lymphatic: No significant vascular findings are present. No
enlarged abdominal or pelvic lymph nodes.

Reproductive: Uterus and bilateral adnexa are unremarkable.

Other: No abdominal wall hernia or abnormality. No abdominopelvic
ascites.

Musculoskeletal: No acute or significant osseous findings.
IMPRESSION: 1. Fatty liver.
2. No acute or active process within the abdomen or pelvis.

## 2021-07-27 MED ORDER — MORPHINE SULFATE (PF) 4 MG/ML IV SOLN
4.0000 mg | Freq: Once | INTRAVENOUS | Status: AC
  Administered 2021-07-27: 4 mg via INTRAVENOUS
  Filled 2021-07-27: qty 1

## 2021-07-27 NOTE — ED Provider Notes (Signed)
[facility] EMERGENCY DEPARTMENT Provider Note   CSN: [id number] Arrival date & time: 07/27/21  0028     History Chief Complaint  Patient presents with   Abdominal Pain    Felicia Bradley is a 30 y.o. female.   Abdominal Pain Pain location:  L flank Pain quality: aching and sharp   Pain radiates to:  L flank Pain severity:  Mild Duration:  3 days Timing:  Constant Progression:  Worsening Chronicity:  New Context: not alcohol use   Associated symptoms: vomiting       Past Medical History:  Diagnosis Date   Asthma    Bipolar disorder (HCC)    Diabetes mellitus without complication (HCC)    type 2   DVT (deep venous thrombosis) (HCC) 2015   w/ recannulation per Care Everywhere   Hypertension    Obesity    Pulmonary embolism (HCC) 2015    Patient Active Problem List   Diagnosis Date Noted   Acute asthma exacerbation 05/27/2021   History of DVT (deep vein thrombosis) 03/23/2021   History of pulmonary embolus (PE) 03/23/2021   PTSD (post-traumatic stress disorder) 03/23/2021   Bipolar 1 disorder (HCC) 03/23/2021   BMI 50.0-59.9, adult (HCC) 03/23/2021   SIRS (systemic inflammatory response syndrome) (HCC) 03/19/2021   Chest pain 03/19/2021   Emesis 03/19/2021   Malingering 01/22/2020   Acute respiratory disease due to COVID-19 virus 01/18/2020   Asthma 01/18/2020   Asthma exacerbation 01/14/2020   Type 2 DM with CKD and hypertension (HCC) 01/14/2020   OSA (obstructive sleep apnea) 01/14/2020   Suspected COVID-19 virus infection     Past Surgical History:  Procedure Laterality Date   CESAREAN SECTION       OB History     Gravida  3   Para      Term      Preterm      AB  2   Living  0      SAB  1   IAB  1   Ectopic      Multiple      Live Births              Family History  Problem Relation Age of Onset   Diabetes Mother    Asthma Mother    Asthma Maternal Aunt    Diabetes Maternal Aunt     Social History    Tobacco Use   Smoking status: Never   Smokeless tobacco: Never  Substance Use Topics   Alcohol use: No   Drug use: No    Home Medications Prior to Admission medications   Medication Sig Start Date End Date Taking? Authorizing Provider  albuterol (VENTOLIN HFA) 108 (90 Base) MCG/ACT inhaler Inhale 1 puff into the lungs 2 (two) times daily as needed for wheezing or shortness of breath.  11/10/14   [provider]  ALPRAZolam Prudy Feeler) 0.5 MG tablet Take 0.5 mg by mouth 3 (three) times daily. 01/23/21   [provider]  ARIPiprazole (ABILIFY) 2 MG tablet Take 2 mg by mouth daily.    [provider]  aspirin EC 81 MG tablet Take 81 mg by mouth daily. Swallow whole.    [provider]  diclofenac Sodium (VOLTAREN) 1 % GEL Apply 2 g topically 4 (four) times daily. Patient taking differently: Apply 2 g topically 2 (two) times daily as needed (pain). 06/28/20   Hall-Potvin, [place], PA-C  EPINEPHrine 0.3 mg/0.3 mL IJ SOAJ injection Inject 0.3 mg into  the muscle as needed for anaphylaxis.  11/10/14   [provider]  fluticasone-salmeterol (ADVAIR) 250-50 MCG/ACT AEPB Inhale 1 puff into the lungs in the morning and at bedtime.    [provider]  gabapentin (NEURONTIN) 300 MG capsule Take 300 mg by mouth 3 (three) times daily.    [provider]  ipratropium-albuterol (DUONEB) 0.5-2.5 (3) MG/3ML SOLN Take 3 mLs by nebulization in the morning, at noon, and at bedtime. 07/19/19   [provider]  Multiple Vitamin (MULTIVITAMIN WITH MINERALS) TABS tablet Take 1 tablet by mouth daily.    [provider]  oxyCODONE-acetaminophen (PERCOCET) 10-325 MG tablet Take 1 tablet by mouth 3 (three) times daily. 02/24/21   [provider]  pantoprazole (PROTONIX) 40 MG tablet Take 1 tablet (40 mg total) by mouth daily. 05/30/21 06/29/21  Lynn Ito, MD    Allergies    Contrast media [iodinated diagnostic agents], Dilaudid  [hydromorphone], Tramadol, Apple, Banana, Coconut oil, Dexamethasone, Other, Solu-medrol [methylprednisolone], and Toradol [ketorolac tromethamine]  Review of Systems   Review of Systems  Gastrointestinal:  Positive for abdominal pain and vomiting.  All other systems reviewed and are negative.  Physical Exam Updated Vital Signs BP (!) 142/107   Pulse 96   Temp 98.5 F (36.9 C) (Oral)   Resp 20   Ht 5\' 3"  (1.6 m)   Wt (!) 142.9 kg   SpO2 100%   BMI 55.80 kg/m   Physical Exam Vitals and nursing note reviewed.  Constitutional:      Appearance: She is well-developed.  HENT:     Head: Normocephalic and atraumatic.     Mouth/Throat:     Mouth: Mucous membranes are moist.     Pharynx: Oropharynx is clear.  Eyes:     Pupils: Pupils are equal, round, and reactive to light.  Cardiovascular:     Rate and Rhythm: Normal rate and regular rhythm.  Pulmonary:     Effort: No respiratory distress.     Breath sounds: No stridor.  Abdominal:     General: There is no distension.     Palpations: Abdomen is soft.  Musculoskeletal:        General: Tenderness (left flank, no obvious rashes) present. No swelling. Normal range of motion.     Cervical back: Normal range of motion.  Skin:    General: Skin is warm and dry.  Neurological:     General: No focal deficit present.     Mental Status: She is alert.    ED Results / Procedures / Treatments   Labs (all labs ordered are listed, but only abnormal results are displayed) Labs Reviewed  COMPREHENSIVE METABOLIC PANEL - Abnormal; Notable for the following components:      Result Value   Glucose, Bld 101 (*)    All other components within normal limits  CBC - Abnormal; Notable for the following components:   Hemoglobin 11.4 (*)    RDW 16.7 (*)    All other components within normal limits  URINALYSIS, ROUTINE W REFLEX MICROSCOPIC - Abnormal; Notable for the following components:   APPearance HAZY (*)    Specific Gravity, Urine >1.030  (*)    Hgb urine dipstick LARGE (*)    All other components within normal limits  URINALYSIS, MICROSCOPIC (REFLEX) - Abnormal; Notable for the following components:   Bacteria, UA RARE (*)    All other components within normal limits  LIPASE, BLOOD  PREGNANCY, URINE    EKG None  Radiology CT  Renal Stone Study  Result Date: 07/27/2021 CLINICAL DATA:  Left flank pain x3 days. EXAM: CT ABDOMEN AND PELVIS WITHOUT CONTRAST TECHNIQUE: Multidetector CT imaging of the abdomen and pelvis was performed following the standard protocol without IV contrast. COMPARISON:  Apr 28, 2021 FINDINGS: Lower chest: No acute abnormality. Hepatobiliary: There is diffuse fatty infiltration of the liver parenchyma. No focal liver abnormality is seen. No gallstones, gallbladder wall thickening, or biliary dilatation. Pancreas: Unremarkable. No pancreatic ductal dilatation or surrounding inflammatory changes. Spleen: Normal in size without focal abnormality. Adrenals/Urinary Tract: Adrenal glands are unremarkable. Kidneys are normal, without renal calculi, focal lesion, or hydronephrosis. The urinary bladder is partially contracted and subsequently limited in evaluation. Stomach/Bowel: Stomach is within normal limits. Appendix appears normal. No evidence of bowel dilatation. Noninflamed diverticula are seen within the proximal to mid sigmoid colon. Vascular/Lymphatic: No significant vascular findings are present. No enlarged abdominal or pelvic lymph nodes. Reproductive: Uterus and bilateral adnexa are unremarkable. Other: No abdominal wall hernia or abnormality. No abdominopelvic ascites. Musculoskeletal: No acute or significant osseous findings. IMPRESSION: 1. Fatty liver. 2. No acute or active process within the abdomen or pelvis. Electronically Signed   By: Aram Candela M.D.   On: 07/27/2021 02:24    Procedures Procedures   Medications Ordered in ED Medications  morphine 4 MG/ML injection 4 mg (4 mg Intravenous  Given 07/27/21 0128)    ED Course  I have reviewed the triage vital signs and the nursing notes.  Pertinent labs & imaging results that were available during my care of the patient were reviewed by me and considered in my medical decision making (see chart for details).    MDM Rules/Calculators/A&P                         Seems msk however with hematuria and radiation/emesis, will need to rule out Stone  No stone. Likely MSK. Already on muscle relaxers. Stable for dc.   Final Clinical Impression(s) / ED Diagnoses Final diagnoses:  Flank pain  Non-intractable vomiting with nausea, unspecified vomiting type    Rx / DC Orders ED Discharge Orders     None        Rayvon Dakin, Barbara Cower, MD 07/27/21 [phone]

## 2021-07-27 NOTE — ED Triage Notes (Signed)
L flank pain x 3 days. Endorses vomiting x4

## 2021-08-24 ENCOUNTER — Encounter (HOSPITAL_BASED_OUTPATIENT_CLINIC_OR_DEPARTMENT_OTHER): Payer: Self-pay | Admitting: *Deleted

## 2021-08-24 ENCOUNTER — Emergency Department (HOSPITAL_BASED_OUTPATIENT_CLINIC_OR_DEPARTMENT_OTHER): Payer: Medicaid Other

## 2021-08-24 ENCOUNTER — Other Ambulatory Visit: Payer: Self-pay

## 2021-08-24 ENCOUNTER — Emergency Department (HOSPITAL_BASED_OUTPATIENT_CLINIC_OR_DEPARTMENT_OTHER)
Admission: EM | Admit: 2021-08-24 | Discharge: 2021-08-25 | Disposition: A | Discharge status: Home / Self Care | Payer: Medicaid Other | Attending: Emergency Medicine | Admitting: Emergency Medicine

## 2021-08-24 DIAGNOSIS — I10 Essential (primary) hypertension: Secondary | ICD-10-CM | POA: Diagnosis not present

## 2021-08-24 DIAGNOSIS — R0602 Shortness of breath: Secondary | ICD-10-CM | POA: Diagnosis present

## 2021-08-24 DIAGNOSIS — Z20822 Contact with and (suspected) exposure to covid-19: Secondary | ICD-10-CM | POA: Insufficient documentation

## 2021-08-24 DIAGNOSIS — E119 Type 2 diabetes mellitus without complications: Secondary | ICD-10-CM | POA: Insufficient documentation

## 2021-08-24 DIAGNOSIS — J45901 Unspecified asthma with (acute) exacerbation: Secondary | ICD-10-CM | POA: Insufficient documentation

## 2021-08-24 DIAGNOSIS — Z79899 Other long term (current) drug therapy: Secondary | ICD-10-CM | POA: Diagnosis not present

## 2021-08-24 DIAGNOSIS — Z7982 Long term (current) use of aspirin: Secondary | ICD-10-CM | POA: Insufficient documentation

## 2021-08-24 DIAGNOSIS — Z8616 Personal history of COVID-19: Secondary | ICD-10-CM | POA: Diagnosis not present

## 2021-08-24 DIAGNOSIS — R042 Hemoptysis: Secondary | ICD-10-CM | POA: Diagnosis not present

## 2021-08-24 DIAGNOSIS — E669 Obesity, unspecified: Secondary | ICD-10-CM | POA: Insufficient documentation

## 2021-08-24 DIAGNOSIS — R062 Wheezing: Secondary | ICD-10-CM

## 2021-08-24 LAB — CBC WITH DIFFERENTIAL/PLATELET
Abs Immature Granulocytes: 0.03 10*3/uL (ref 0.00–0.07)
Basophils Absolute: 0.1 10*3/uL (ref 0.0–0.1)
Basophils Relative: 1 %
Eosinophils Absolute: 0.1 10*3/uL (ref 0.0–0.5)
Eosinophils Relative: 1 %
HCT: 39.3 % (ref 36.0–46.0)
Hemoglobin: 12 g/dL (ref 12.0–15.0)
Immature Granulocytes: 0 %
Lymphocytes Relative: 40 %
Lymphs Abs: 3.7 10*3/uL (ref 0.7–4.0)
MCH: 26.2 pg (ref 26.0–34.0)
MCHC: 30.5 g/dL (ref 30.0–36.0)
MCV: 85.8 fL (ref 80.0–100.0)
Monocytes Absolute: 0.4 10*3/uL (ref 0.1–1.0)
Monocytes Relative: 5 %
Neutro Abs: 4.9 10*3/uL (ref 1.7–7.7)
Neutrophils Relative %: 53 %
Platelets: 361 10*3/uL (ref 150–400)
RBC: 4.58 MIL/uL (ref 3.87–5.11)
RDW: 16.4 % — ABNORMAL HIGH (ref 11.5–15.5)
WBC: 9.1 10*3/uL (ref 4.0–10.5)
nRBC: 0 % (ref 0.0–0.2)

## 2021-08-24 LAB — RESP PANEL BY RT-PCR (FLU A&B, COVID) ARPGX2
Influenza A by PCR: NEGATIVE
Influenza B by PCR: NEGATIVE
SARS Coronavirus 2 by RT PCR: NEGATIVE

## 2021-08-24 LAB — BASIC METABOLIC PANEL
Anion gap: 9 (ref 5–15)
BUN: 12 mg/dL (ref 6–20)
CO2: 25 mmol/L (ref 22–32)
Calcium: 9.3 mg/dL (ref 8.9–10.3)
Chloride: 106 mmol/L (ref 98–111)
Creatinine, Ser: 0.75 mg/dL (ref 0.44–1.00)
GFR, Estimated: >60 mL/min (ref >60)
Glucose, Bld: 140 mg/dL — ABNORMAL HIGH (ref 70–99)
Potassium: 3.7 mmol/L (ref 3.5–5.1)
Sodium: 140 mmol/L (ref 135–145)

## 2021-08-24 LAB — PROTIME-INR
INR: 0.9 (ref 0.8–1.2)
Prothrombin Time: 12.5 seconds (ref 11.4–15.2)

## 2021-08-24 LAB — BRAIN NATRIURETIC PEPTIDE: B Natriuretic Peptide: 8.2 pg/mL (ref 0.0–100.0)

## 2021-08-24 IMAGING — DX DG CHEST 1V
1 series · 1 of 1 positions shown · non-contrast
Comparison: [DATE]

CLINICAL DATA: Cough for the past 3 days. Left upper abdominal pain
today. Hemoptysis.

EXAM:
CHEST  1 VIEW

[chest]
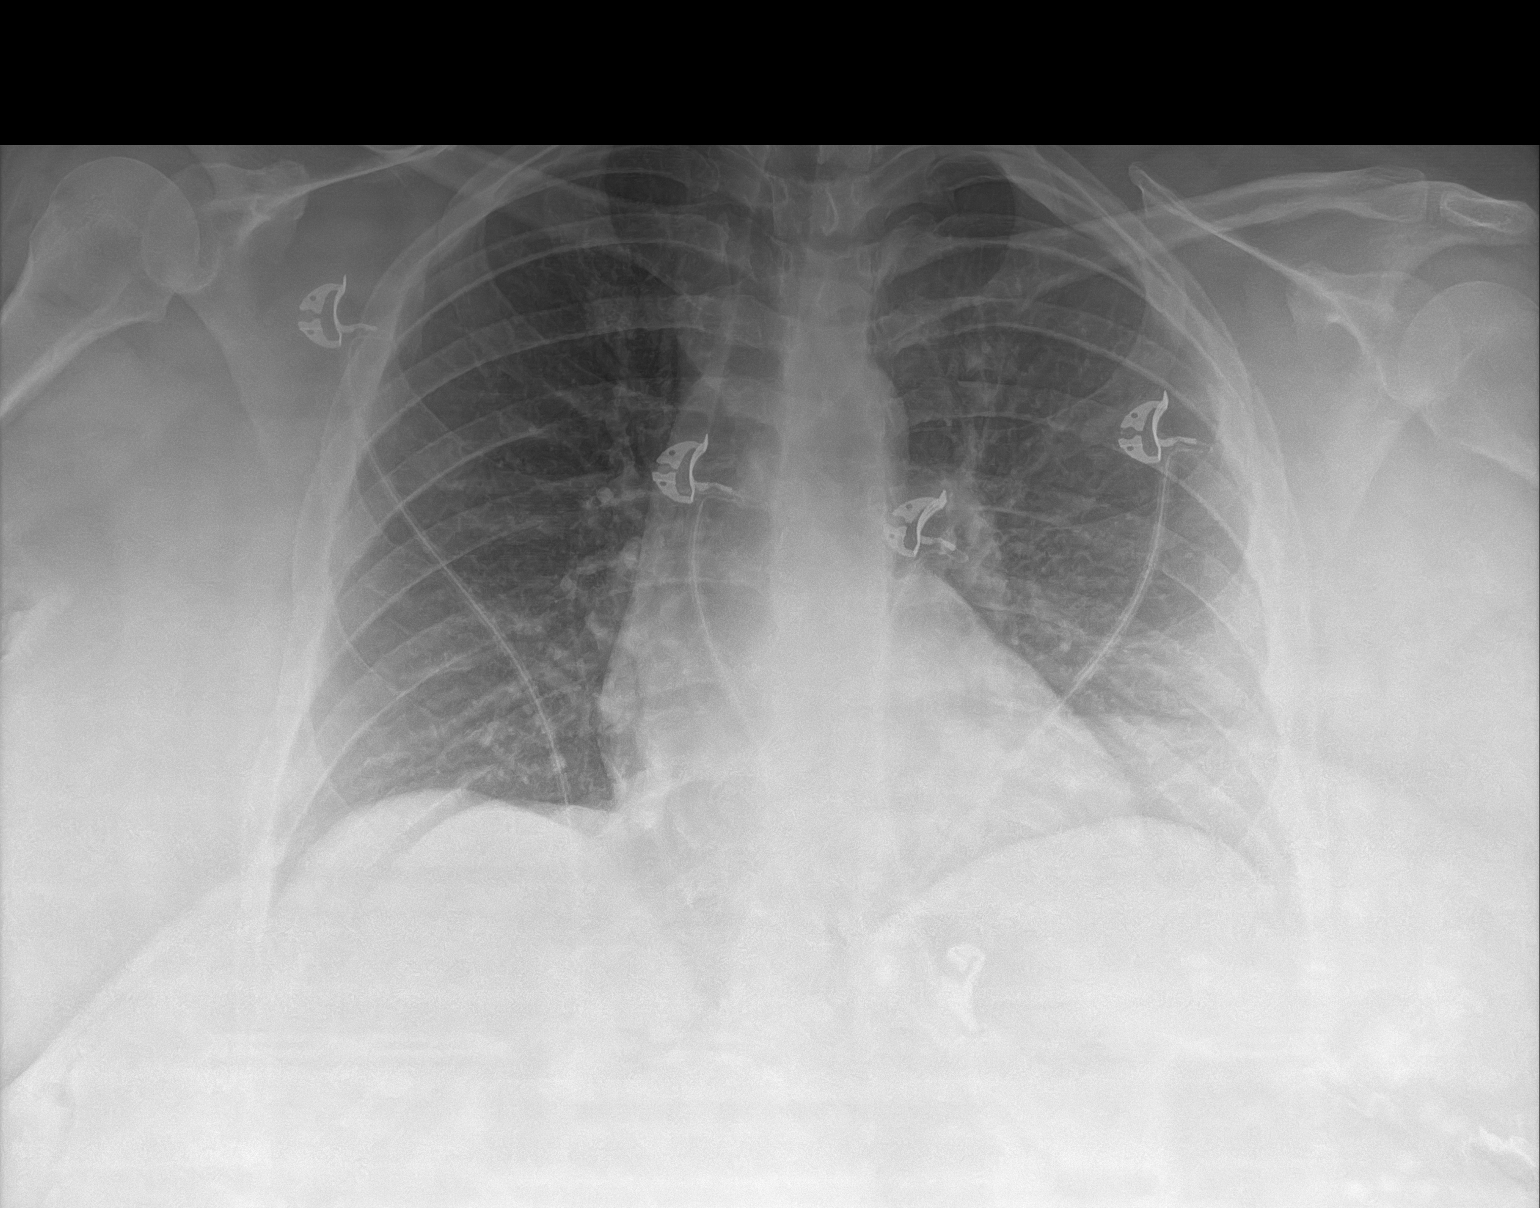

[1 of 1 positions shown; findings below may reference images not displayed]

FINDINGS: The heart size and mediastinal contours are within normal limits.
Both lungs are clear. The visualized skeletal structures are
unremarkable.
IMPRESSION: Normal examination.

## 2021-08-24 MED ORDER — OXYCODONE-ACETAMINOPHEN 5-325 MG PO TABS
1.0000 | ORAL_TABLET | Freq: Once | ORAL | Status: AC
  Administered 2021-08-24: 1 via ORAL
  Filled 2021-08-24: qty 1

## 2021-08-24 MED ORDER — IPRATROPIUM BROMIDE 0.02 % IN SOLN
1.0000 mg | Freq: Once | RESPIRATORY_TRACT | Status: AC
  Administered 2021-08-24: 1 mg via RESPIRATORY_TRACT
  Filled 2021-08-24: qty 5

## 2021-08-24 MED ORDER — MAGNESIUM SULFATE 2 GM/50ML IV SOLN
2.0000 g | Freq: Once | INTRAVENOUS | Status: AC
  Administered 2021-08-24: 2 g via INTRAVENOUS
  Filled 2021-08-24: qty 50

## 2021-08-24 MED ORDER — ALBUTEROL (5 MG/ML) CONTINUOUS INHALATION SOLN
10.0000 mg/h | INHALATION_SOLUTION | Freq: Once | RESPIRATORY_TRACT | Status: AC
  Administered 2021-08-24: 10 mg/h via RESPIRATORY_TRACT
  Filled 2021-08-24: qty 20

## 2021-08-24 MED ORDER — ONDANSETRON HCL 4 MG/2ML IJ SOLN
4.0000 mg | Freq: Once | INTRAMUSCULAR | Status: AC
  Administered 2021-08-24: 4 mg via INTRAVENOUS
  Filled 2021-08-24: qty 2

## 2021-08-24 MED ORDER — METHYLPREDNISOLONE SODIUM SUCC 125 MG IJ SOLR
125.0000 mg | Freq: Once | INTRAMUSCULAR | Status: AC
  Administered 2021-08-24: 125 mg via INTRAVENOUS
  Filled 2021-08-24: qty 2

## 2021-08-24 NOTE — ED Provider Notes (Signed)
[facility] EMERGENCY DEPT Provider Note   CSN: [id number] Arrival date & time: 08/24/21  1653     History Chief Complaint  Patient presents with   Shortness of Breath   Cough    Felicia Bradley is a 30 y.o. female with PMHx HTN, Diabetes, Asthma, DVT/PE (not anticoagulated at this time) who presents to the ED today with complaint of hemoptysis that began today. Pt reports that for the past 2-3 days she has had a productive cough with green sputum. Today she began coughing up clots. She also complains of atraumatic left "side" pain that worsens with deep breathing. Pt states she has been using her inhalers at home without relief. She denies fevers or chills. She does mention that she is no longer taking Coumadin and states she was taken off of it during her last admission - per chart review she was admitted in June initially for PID however during admission became dyspneic and hypoxic and found to have multifocal pneumonia vs pulmonary edema with BNP 100. She was given 40-60 mg IV Lasix and diuresed well. She was discharge on 40 mg Lasix. There is no mention of her being taken off of her coumadin at that time.   Per previous hospital admission in early June it was noted that she was holding her Coumadin due to anticipation for weight loss surgery (has not had this done). Last clot was reported in 2016.   The history is provided by the patient and medical records.      Past Medical History:  Diagnosis Date   Asthma    Bipolar disorder (HCC)    Diabetes mellitus without complication (HCC)    type 2   DVT (deep venous thrombosis) (HCC) 2015   w/ recannulation per Care Everywhere   Hypertension    Obesity    Pulmonary embolism (HCC) 2015    Patient Active Problem List   Diagnosis Date Noted   Acute asthma exacerbation 05/27/2021   History of DVT (deep vein thrombosis) 03/23/2021   History of pulmonary embolus (PE) 03/23/2021   PTSD (post-traumatic stress disorder)  03/23/2021   Bipolar 1 disorder (HCC) 03/23/2021   BMI 50.0-59.9, adult (HCC) 03/23/2021   SIRS (systemic inflammatory response syndrome) (HCC) 03/19/2021   Chest pain 03/19/2021   Emesis 03/19/2021   Malingering 01/22/2020   Acute respiratory disease due to COVID-19 virus 01/18/2020   Asthma 01/18/2020   Asthma exacerbation 01/14/2020   Type 2 DM with CKD and hypertension (HCC) 01/14/2020   OSA (obstructive sleep apnea) 01/14/2020   Suspected COVID-19 virus infection     Past Surgical History:  Procedure Laterality Date   CESAREAN SECTION       OB History     Gravida  3   Para      Term      Preterm      AB  2   Living  0      SAB  1   IAB  1   Ectopic      Multiple      Live Births              Family History  Problem Relation Age of Onset   Diabetes Mother    Asthma Mother    Asthma Maternal Aunt    Diabetes Maternal Aunt     Social History   Tobacco Use   Smoking status: Never   Smokeless tobacco: Never  Substance Use Topics   Alcohol use: No  Drug use: No    Home Medications Prior to Admission medications   Medication Sig Start Date End Date Taking? Authorizing Provider  albuterol (VENTOLIN HFA) 108 (90 Base) MCG/ACT inhaler Inhale 1 puff into the lungs 2 (two) times daily as needed for wheezing or shortness of breath.  11/10/14   [provider]  ALPRAZolam Prudy Feeler(XANAX) 0.5 MG tablet Take 0.5 mg by mouth 3 (three) times daily. 01/23/21   [provider]  ARIPiprazole (ABILIFY) 2 MG tablet Take 2 mg by mouth daily.    [provider]  aspirin EC 81 MG tablet Take 81 mg by mouth daily. Swallow whole.    [provider]  diclofenac Sodium (VOLTAREN) 1 % GEL Apply 2 g topically 4 (four) times daily. Patient taking differently: Apply 2 g topically 2 (two) times daily as needed (pain). 06/28/20   Hall-Potvin, [place], PA-C  EPINEPHrine 0.3 mg/0.3 mL IJ SOAJ injection Inject 0.3 mg into the muscle as needed  for anaphylaxis.  11/10/14   [provider]  fluticasone-salmeterol (ADVAIR) 250-50 MCG/ACT AEPB Inhale 1 puff into the lungs in the morning and at bedtime.    [provider]  gabapentin (NEURONTIN) 300 MG capsule Take 300 mg by mouth 3 (three) times daily.    [provider]  ipratropium-albuterol (DUONEB) 0.5-2.5 (3) MG/3ML SOLN Take 3 mLs by nebulization in the morning, at noon, and at bedtime. 07/19/19   [provider]  Multiple Vitamin (MULTIVITAMIN WITH MINERALS) TABS tablet Take 1 tablet by mouth daily.    [provider]  oxyCODONE-acetaminophen (PERCOCET) 10-325 MG tablet Take 1 tablet by mouth 3 (three) times daily. 02/24/21   [provider]  pantoprazole (PROTONIX) 40 MG tablet Take 1 tablet (40 mg total) by mouth daily. 05/30/21 06/29/21  Lynn ItoAmery, Sahar, MD    Allergies    Contrast media [iodinated diagnostic agents], Dilaudid [hydromorphone], Tramadol, Apple, Banana, Coconut oil, Dexamethasone, Other, Solu-medrol [methylprednisolone], and Toradol [ketorolac tromethamine]  Review of Systems   Review of Systems  Constitutional:  Negative for chills and fever.  Respiratory:         + hemoptysis  Gastrointestinal:  Negative for nausea and vomiting.  All other systems reviewed and are negative.  Physical Exam Updated Vital Signs BP (!) 136/91   Pulse (!) 112   Temp 99.3 F (37.4 C) (Oral)   Resp 16   Ht 5\' 3"  (1.6 m)   Wt (!) 138.3 kg   SpO2 100%   BMI 54.03 kg/m   Physical Exam Vitals and nursing note reviewed.  Constitutional:      Appearance: She is obese. She is not ill-appearing or diaphoretic.  HENT:     Head: Normocephalic and atraumatic.  Eyes:     Conjunctiva/sclera: Conjunctivae normal.  Cardiovascular:     Rate and Rhythm: Normal rate and regular rhythm.  Pulmonary:     Effort: Tachypnea present.     Breath sounds: Wheezing present. No decreased breath sounds, rhonchi or rales.     Comments: Actively  coughing. No blood appreciated at this time. Speaking in shorter sentences between cough spells. + tachypneic.  Chest:     Chest wall: Tenderness present.  Abdominal:     Palpations: Abdomen is soft.     Tenderness: There is no abdominal tenderness. There is no guarding or rebound.  Musculoskeletal:     Cervical back: Neck supple.  Skin:    General: Skin is warm and dry.  Neurological:     Mental  Status: She is alert.    ED Results / Procedures / Treatments   Labs (all labs ordered are listed, but only abnormal results are displayed) Labs Reviewed  CBC WITH DIFFERENTIAL/PLATELET - Abnormal; Notable for the following components:      Result Value   RDW 16.4 (*)    All other components within normal limits  BASIC METABOLIC PANEL - Abnormal; Notable for the following components:   Glucose, Bld 140 (*)    All other components within normal limits  RESP PANEL BY RT-PCR (FLU A&B, COVID) ARPGX2  BRAIN NATRIURETIC PEPTIDE  PROTIME-INR  PREGNANCY, URINE    EKG EKG Interpretation  Date/Time:  Sunday August 24 2021 17:25:29 EDT Ventricular Rate:  105 PR Interval:  138 QRS Duration: 84 QT Interval:  327 QTC Calculation: 433 R Axis:   101 Text Interpretation: Sinus tachycardia Borderline right axis deviation Borderline Q waves in lateral leads Nonspecific repol abnormality, inferior leads Borderline ST elevation, lateral leads When compared to prior, similar appearance with S1Q3T3. No STEMI Confirmed by Tegeler, Chris ([id number]) on 08/24/2021 5:27:04 PM  Radiology DG Chest 1 View  Result Date: 08/24/2021 CLINICAL DATA:  Cough for the past 3 days. Left upper abdominal pain today. Hemoptysis. EXAM: CHEST  1 VIEW COMPARISON:  07/03/2021 FINDINGS: The heart size and mediastinal contours are within normal limits. Both lungs are clear. The visualized skeletal structures are unremarkable. IMPRESSION: Normal examination. Electronically Signed   By: Steven  Reid M.D.   On: 08/24/2021 17:56     Procedures Procedures   Medications Ordered in ED Medications  methylPREDNISolone sodium succinate (SOLU-MEDROL) 125 mg/2 mL injection 125 mg (125 mg Intravenous Given 08/24/21 1736)  albuterol (PROVENTIL,VENTOLIN) solution continuous neb (10 mg/hr Nebulization Given 08/24/21 1810)  ipratropium (ATROVENT) nebulizer solution 1 mg (1 mg Nebulization Given 08/24/21 1810)  magnesium sulfate IVPB 2 g 50 mL (0 g Intravenous Stopped 08/24/21 1912)  ondansetron (ZOFRAN) injection 4 mg (4 mg Intravenous Given 08/24/21 1747)    ED Course  I have reviewed the triage vital signs and the nursing notes.  Pertinent labs & imaging results that were available during my care of the patient were reviewed by me and considered in my medical decision making (see chart for details).    MDM Rules/Calculators/A&P                           29  year old female presenting to the ED today with complaint of hemoptysis that began today - productive cough for the past 2-3 days with SOB and left sided chest/abdomen pain. On arrival to the ED pt is afebrile at 993, tachy in the 110s, mildly tachypneic. She originally came on 3L [place] which she wears at home however satting 100% on RA. Taken off of oxygen at this time. On my exam she appears to be in some respiratory distress. She is speaking in shorter sentences between coughing spells. She is wheezing throughout and actively coughing. No blood appreciated at this time. She has apparently required intubation in the past for asthma exacerbation. Will provide continuous neb, solumedrol, and mag. Will obtain CXR and labs. Pt will likely require admission for same. There is significant concern for PE at this time given hx of same. Unfortunately pt has a severe contrast allergy - will need VQ scan which we do not have capability for at this department.   CBC without leukocytosis. Hgb stable at 12.0 BMP with glucose 140. No other acute  findings.  CXR clear COVID and flu negative BNP  8.2 INR 0.9  On reevaluation after medications pt states some improvement. She continues to wheeze throughout. She is still tachycardic. I have discussed admission with her - unfortunately at this time she states she needs to go home and take care of childcare for her daughter before being admitted. It does not appear she can have VQ scan in the morning per nuclear medicine department as their cutoff time if 8:30 PM tonight (currently 7:37 PM) and pts need to be medicated for 1.5 hours beforehand. She did not have a troponin initially drawn as well however given she needs to go home and take care of her child do not feel it would change treatment course. I have lower suspicion for ACS at this time. We have talked about her going to [facility] to get VQ scan done vs possible admission for asthma exacerbation - pt states once she has solidified childcare she will go there directly. She understands she cannot have scan done until the AM however I would feel more comfortable with her being in the ED vs at home in case she decompensates. She is in agreement with plan at this time. She understands the risks of not being directly admitted and transferred via carelink. She will be discharged here at this time. I have discussed case with Dr. Particia Nearing in the [facility] ED who is accepting patient however understands that pt will not have VQ scan until the AM. VQ scan ordered here.   This note was prepared using Dragon voice recognition software and may include unintentional dictation errors due to the inherent limitations of voice recognition software.   Final Clinical Impression(s) / ED Diagnoses Final diagnoses:  SOB (shortness of breath)  Wheezing  Coughing up blood    Rx / DC Orders ED Discharge Orders     None        Tanda Rockers, PA-C 08/24/21 1942    Tegeler, Canary Brim, MD 08/24/21 2128

## 2021-08-24 NOTE — ED Triage Notes (Addendum)
Pt states she has had a cough for about 3 days, pain in left upper abd,/side started today. Pt states she is coughing up blood. Pt is on 3L [place] home 02. History of asthma and COPD. Also on monthly cycle and passing clots.

## 2021-08-24 NOTE — Discharge Instructions (Addendum)
Please go directly to [facility] Emergency Department for further evaluation of your shortness of breath and coughing up blood

## 2021-08-25 NOTE — ED Notes (Signed)
Patient removed from off the floor and discharged out due to not arriving at [facility] ED.

## 2021-09-09 ENCOUNTER — Other Ambulatory Visit: Payer: Self-pay

## 2021-09-09 ENCOUNTER — Ambulatory Visit
Admission: EM | Admit: 2021-09-09 | Discharge: 2021-09-09 | Disposition: A | Discharge status: Home / Self Care | Payer: Medicaid Other

## 2021-09-09 ENCOUNTER — Encounter: Payer: Self-pay | Admitting: Emergency Medicine

## 2021-09-09 DIAGNOSIS — Z3490 Encounter for supervision of normal pregnancy, unspecified, unspecified trimester: Secondary | ICD-10-CM

## 2021-09-09 DIAGNOSIS — M25561 Pain in right knee: Secondary | ICD-10-CM | POA: Diagnosis not present

## 2021-09-09 DIAGNOSIS — O1490 Unspecified pre-eclampsia, unspecified trimester: Secondary | ICD-10-CM

## 2021-09-09 NOTE — ED Notes (Signed)
Patient just received a phone call about her daughter at [facility] and states she needs to leave now. Patient discussed risks of BP with provider before leaving

## 2021-09-09 NOTE — ED Triage Notes (Signed)
Right knee "going out" on her and causing her to fall twice yesterday. Concerned about the falling due to being in her second trimester. 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

## 2021-09-09 NOTE — ED Provider Notes (Signed)
[facility]   MRN: [id number] DOB: 1991-02-17  Subjective:   Felicia Bradley is a 30 y.o. female presenting for having episodes of right knee buckling.  This started yesterday.  Patient is also pregnant, just found out this past week.  She has severely elevated blood pressure, reports a history of preeclampsia.  She also has a history of diabetes, pulmonary embolism.  Denies any active headache, confusion, weakness, numbness or tingling.  No current facility-administered medications for this encounter.  Current Outpatient Medications:    albuterol (VENTOLIN HFA) 108 (90 Base) MCG/ACT inhaler, Inhale 1 puff into the lungs 2 (two) times daily as needed for wheezing or shortness of breath. , Disp: , Rfl:    ALPRAZolam (XANAX) 0.5 MG tablet, Take 0.5 mg by mouth 3 (three) times daily., Disp: , Rfl:    ARIPiprazole (ABILIFY) 2 MG tablet, Take 2 mg by mouth daily., Disp: , Rfl:    aspirin EC 81 MG tablet, Take 81 mg by mouth daily. Swallow whole., Disp: , Rfl:    diclofenac Sodium (VOLTAREN) 1 % GEL, Apply 2 g topically 4 (four) times daily. (Patient taking differently: Apply 2 g topically 2 (two) times daily as needed (pain).), Disp: 100 g, Rfl: 0   EPINEPHrine 0.3 mg/0.3 mL IJ SOAJ injection, Inject 0.3 mg into the muscle as needed for anaphylaxis. , Disp: , Rfl:    fluticasone-salmeterol (ADVAIR) 250-50 MCG/ACT AEPB, Inhale 1 puff into the lungs in the morning and at bedtime., Disp: , Rfl:    gabapentin (NEURONTIN) 300 MG capsule, Take 300 mg by mouth 3 (three) times daily., Disp: , Rfl:    ipratropium-albuterol (DUONEB) 0.5-2.5 (3) MG/3ML SOLN, Take 3 mLs by nebulization in the morning, at noon, and at bedtime., Disp: , Rfl:    Multiple Vitamin (MULTIVITAMIN WITH MINERALS) TABS tablet, Take 1 tablet by mouth daily., Disp: , Rfl:    oxyCODONE-acetaminophen (PERCOCET) 10-325 MG tablet, Take 1 tablet by mouth 3 (three) times daily., Disp: , Rfl:    pantoprazole (PROTONIX) 40 MG  tablet, Take 1 tablet (40 mg total) by mouth daily., Disp: 30 tablet, Rfl: 0   Allergies  Allergen Reactions   Contrast Media [Iodinated Diagnostic Agents] Anaphylaxis   Dilaudid [Hydromorphone] Anaphylaxis   Tramadol Itching   Apple Hives   Banana Hives   Coconut Oil Hives and Itching   Dexamethasone Nausea And Vomiting and Hives    "feels like my pubic hair is on fire"   Other Hives    Zucchini and squash   Solu-Medrol [Methylprednisolone] Nausea And Vomiting   Toradol [Ketorolac Tromethamine] Hives and Itching    Past Medical History:  Diagnosis Date   Asthma    Bipolar disorder (HCC)    Diabetes mellitus without complication (HCC)    type 2   DVT (deep venous thrombosis) (HCC) 2015   w/ recannulation per Care Everywhere   Hypertension    Obesity    Pulmonary embolism (HCC) 2015     Past Surgical History:  Procedure Laterality Date   CESAREAN SECTION      Family History  Problem Relation Age of Onset   Diabetes Mother    Asthma Mother    Asthma Maternal Aunt    Diabetes Maternal Aunt     Social History   Tobacco Use   Smoking status: Never   Smokeless tobacco: Never  Substance Use Topics   Alcohol use: No   Drug use: No    ROS   Objective:  Vitals: BP (!) 172/117 (BP Location: Right Arm)   Pulse (!) 105   Temp 98 F (36.7 C) (Oral)   Resp 16   LMP 04/10/2021   SpO2 98%   Physical Exam Constitutional:      General: She is not in acute distress.    Appearance: Normal appearance. She is well-developed. She is not ill-appearing, toxic-appearing or diaphoretic.  HENT:     Head: Normocephalic and atraumatic.     Right Ear: External ear normal.     Left Ear: External ear normal.     Nose: Nose normal.     Mouth/Throat:     Mouth: Mucous membranes are moist.     Pharynx: Oropharynx is clear.  Eyes:     General: No scleral icterus.       Right eye: No discharge.        Left eye: No discharge.     Extraocular Movements: Extraocular  movements intact.     Conjunctiva/sclera: Conjunctivae normal.     Pupils: Pupils are equal, round, and reactive to light.  Cardiovascular:     Rate and Rhythm: Normal rate.  Pulmonary:     Effort: Pulmonary effort is normal.  Abdominal:     General: Bowel sounds are normal. There is no distension.     Palpations: Abdomen is soft. There is no mass.     Tenderness: There is no abdominal tenderness. There is no right CVA tenderness, left CVA tenderness, guarding or rebound.  Musculoskeletal:     Right knee: No swelling, deformity, effusion, erythema, ecchymosis, lacerations, bony tenderness or crepitus. Normal range of motion. Tenderness present over the medial joint line and lateral joint line. No patellar tendon tenderness. Normal alignment, normal meniscus and normal patellar mobility.  Skin:    General: Skin is warm and dry.     Findings: No rash.  Neurological:     General: No focal deficit present.     Mental Status: She is alert and oriented to person, place, and time.     Cranial Nerves: No cranial nerve deficit.     Motor: No weakness.     Coordination: Coordination normal.     Gait: Gait normal.     Deep Tendon Reflexes: Reflexes normal.  Psychiatric:        Mood and Affect: Mood normal.        Behavior: Behavior normal.        Thought Content: Thought content normal.        Judgment: Judgment normal.     Assessment and Plan :   PDMP not reviewed this encounter.  1. Acute pain of right knee   2. Pregnancy, unspecified gestational age   75. Pre-eclampsia, antepartum     Counseled against imaging as patient is pregnant.  Recommended that she follow-up with [facility] for further evaluation including consideration for obtaining an ultrasound of the right knee.  Use Tylenol for pain relief.  No NSAID use.  Applied a 4 inch Ace wrap to the right knee.  Regarding her blood pressure, it is severely elevated and given her pregnancy is not preeclampsia.  Emphasized  need for further evaluation in the maternal admission unit.  As there is no pregnancy test on file, attempted to do this.  Unfortunately, patient received a call while she was in the clinic that her daughter suffered a severe injury and she had to leave abruptly.  My recommendation start that patient should report to the maternal admission unit  due to her blood pressure in pregnancy.   Wallis Bamberg, PA-C 09/09/21 1130

## 2021-09-24 ENCOUNTER — Emergency Department (HOSPITAL_BASED_OUTPATIENT_CLINIC_OR_DEPARTMENT_OTHER)
Admission: EM | Admit: 2021-09-24 | Discharge: 2021-09-24 | Disposition: A | Discharge status: Home / Self Care | Payer: Medicaid Other | Attending: Emergency Medicine | Admitting: Emergency Medicine

## 2021-09-24 ENCOUNTER — Encounter (HOSPITAL_BASED_OUTPATIENT_CLINIC_OR_DEPARTMENT_OTHER): Payer: Self-pay | Admitting: Urology

## 2021-09-24 ENCOUNTER — Emergency Department (HOSPITAL_BASED_OUTPATIENT_CLINIC_OR_DEPARTMENT_OTHER): Payer: Medicaid Other

## 2021-09-24 ENCOUNTER — Other Ambulatory Visit: Payer: Self-pay

## 2021-09-24 DIAGNOSIS — R109 Unspecified abdominal pain: Secondary | ICD-10-CM | POA: Diagnosis not present

## 2021-09-24 DIAGNOSIS — R197 Diarrhea, unspecified: Secondary | ICD-10-CM | POA: Insufficient documentation

## 2021-09-24 DIAGNOSIS — Z7951 Long term (current) use of inhaled steroids: Secondary | ICD-10-CM | POA: Diagnosis not present

## 2021-09-24 DIAGNOSIS — J45909 Unspecified asthma, uncomplicated: Secondary | ICD-10-CM | POA: Insufficient documentation

## 2021-09-24 DIAGNOSIS — N189 Chronic kidney disease, unspecified: Secondary | ICD-10-CM | POA: Diagnosis not present

## 2021-09-24 DIAGNOSIS — I129 Hypertensive chronic kidney disease with stage 1 through stage 4 chronic kidney disease, or unspecified chronic kidney disease: Secondary | ICD-10-CM | POA: Insufficient documentation

## 2021-09-24 DIAGNOSIS — E1122 Type 2 diabetes mellitus with diabetic chronic kidney disease: Secondary | ICD-10-CM | POA: Insufficient documentation

## 2021-09-24 DIAGNOSIS — Z79899 Other long term (current) drug therapy: Secondary | ICD-10-CM | POA: Diagnosis not present

## 2021-09-24 DIAGNOSIS — Z7982 Long term (current) use of aspirin: Secondary | ICD-10-CM | POA: Diagnosis not present

## 2021-09-24 DIAGNOSIS — Z8616 Personal history of COVID-19: Secondary | ICD-10-CM | POA: Diagnosis not present

## 2021-09-24 LAB — CBC WITH DIFFERENTIAL/PLATELET
Abs Immature Granulocytes: 0.02 10*3/uL (ref 0.00–0.07)
Basophils Absolute: 0 10*3/uL (ref 0.0–0.1)
Basophils Relative: 0 %
Eosinophils Absolute: 0.1 10*3/uL (ref 0.0–0.5)
Eosinophils Relative: 2 %
HCT: 37.2 % (ref 36.0–46.0)
Hemoglobin: 11.5 g/dL — ABNORMAL LOW (ref 12.0–15.0)
Immature Granulocytes: 0 %
Lymphocytes Relative: 40 %
Lymphs Abs: 3.1 10*3/uL (ref 0.7–4.0)
MCH: 26.9 pg (ref 26.0–34.0)
MCHC: 30.9 g/dL (ref 30.0–36.0)
MCV: 87.1 fL (ref 80.0–100.0)
Monocytes Absolute: 0.4 10*3/uL (ref 0.1–1.0)
Monocytes Relative: 5 %
Neutro Abs: 3.9 10*3/uL (ref 1.7–7.7)
Neutrophils Relative %: 53 %
Platelets: 396 10*3/uL (ref 150–400)
RBC: 4.27 MIL/uL (ref 3.87–5.11)
RDW: 15.4 % (ref 11.5–15.5)
WBC: 7.6 10*3/uL (ref 4.0–10.5)
nRBC: 0 % (ref 0.0–0.2)

## 2021-09-24 LAB — COMPREHENSIVE METABOLIC PANEL
ALT: 17 U/L (ref 0–44)
AST: 18 U/L (ref 15–41)
Albumin: 3.5 g/dL (ref 3.5–5.0)
Alkaline Phosphatase: 58 U/L (ref 38–126)
Anion gap: 7 (ref 5–15)
BUN: 8 mg/dL (ref 6–20)
CO2: 26 mmol/L (ref 22–32)
Calcium: 8.8 mg/dL — ABNORMAL LOW (ref 8.9–10.3)
Chloride: 107 mmol/L (ref 98–111)
Creatinine, Ser: 0.58 mg/dL (ref 0.44–1.00)
GFR, Estimated: >60 mL/min (ref >60)
Glucose, Bld: 81 mg/dL (ref 70–99)
Potassium: 3.5 mmol/L (ref 3.5–5.1)
Sodium: 140 mmol/L (ref 135–145)
Total Bilirubin: 0.2 mg/dL — ABNORMAL LOW (ref 0.3–1.2)
Total Protein: 7.5 g/dL (ref 6.5–8.1)

## 2021-09-24 LAB — MAGNESIUM: Magnesium: 1.8 mg/dL (ref 1.7–2.4)

## 2021-09-24 LAB — PREGNANCY, URINE: Preg Test, Ur: NEGATIVE

## 2021-09-24 LAB — LIPASE, BLOOD: Lipase: 22 U/L (ref 11–51)

## 2021-09-24 IMAGING — CT CT ABD-PELV W/O CM
2 of 4 series · 16 of 46 positions shown, 18 images · non-contrast
Comparison: [DATE]

CLINICAL DATA: Right lower quadrant pain, appendicitis suspected

EXAM:
CT ABDOMEN AND PELVIS WITHOUT CONTRAST
TECHNIQUE: Multidetector CT imaging of the abdomen and pelvis was performed
following the standard protocol without IV contrast.

[Series 3: axial st · axial · 0.98mm/px · z∈[-585,-160]mm · 13 of 93 slices shown, 15 images]
[im 4/93  soft-tissue]
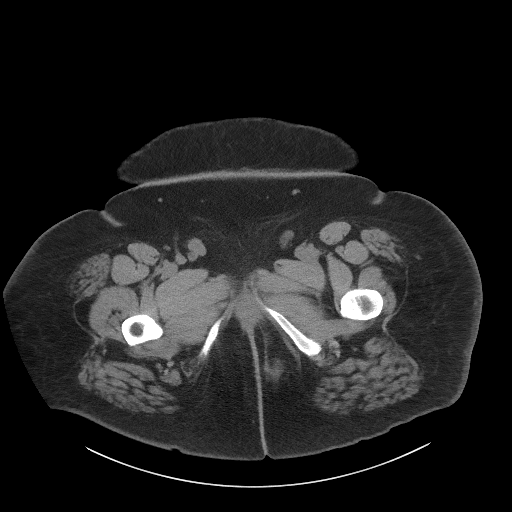
[im 4/93  bone]
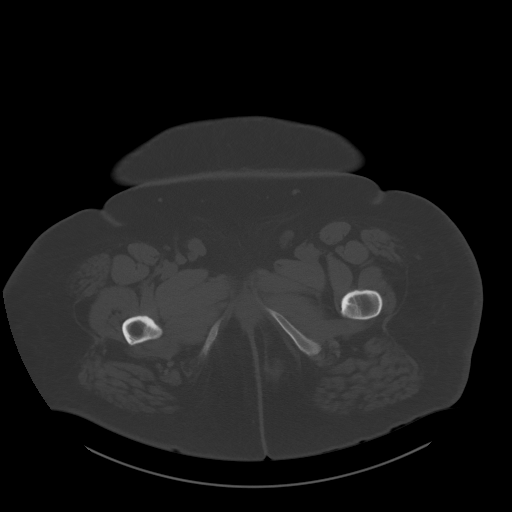
[im 12/93  soft-tissue]
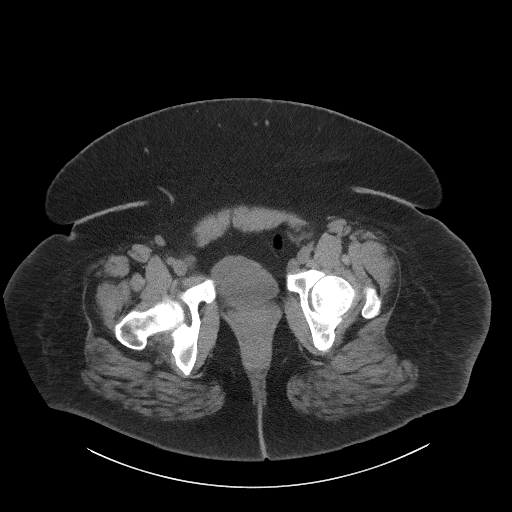
[im 19/93  soft-tissue]
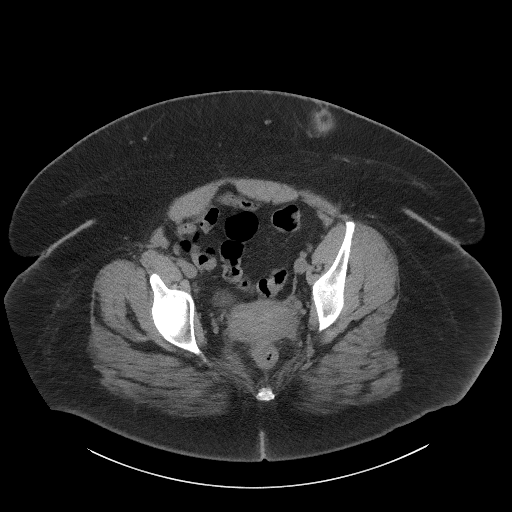
[im 26/93  soft-tissue]
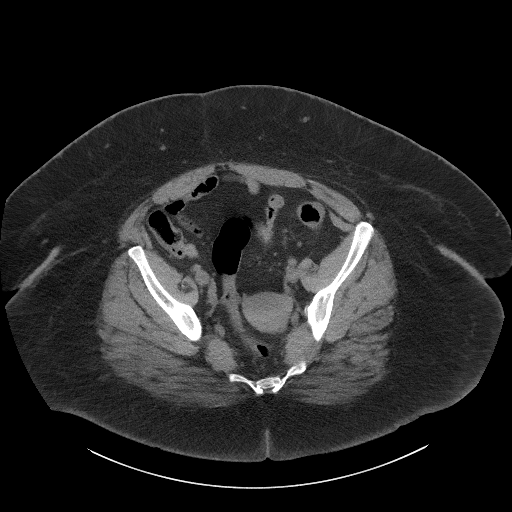
[im 34/93  soft-tissue]
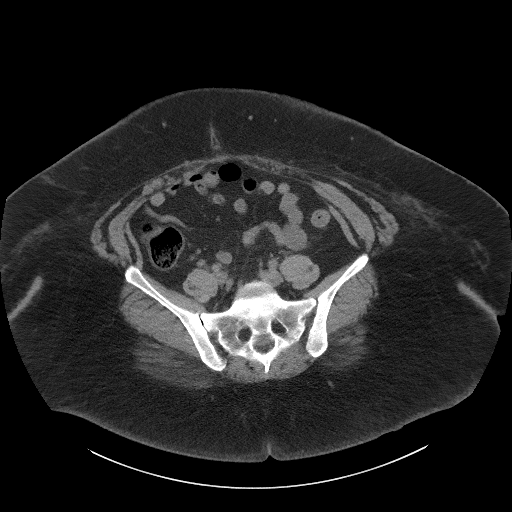
[im 41/93  soft-tissue]
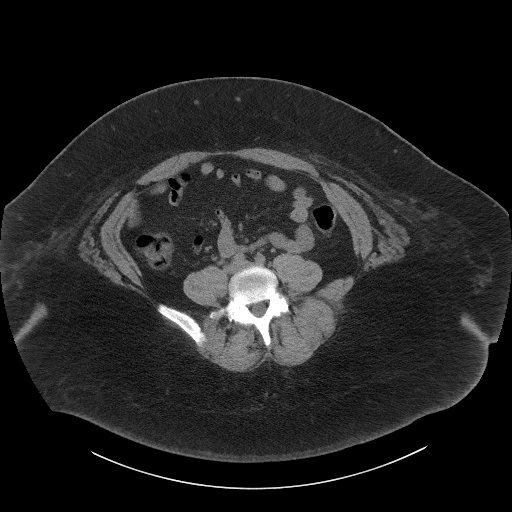
[im 48/93  soft-tissue]
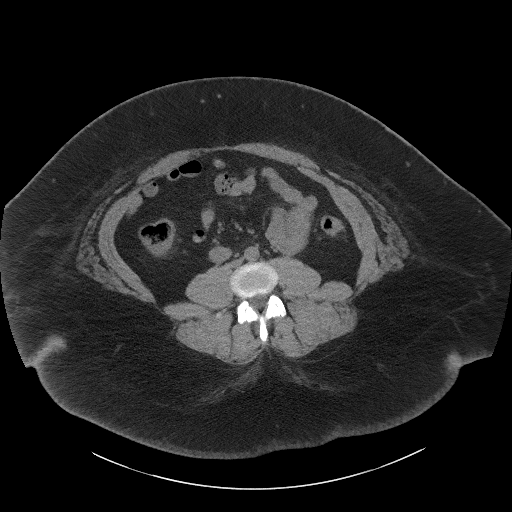
[im 52/93  soft-tissue]
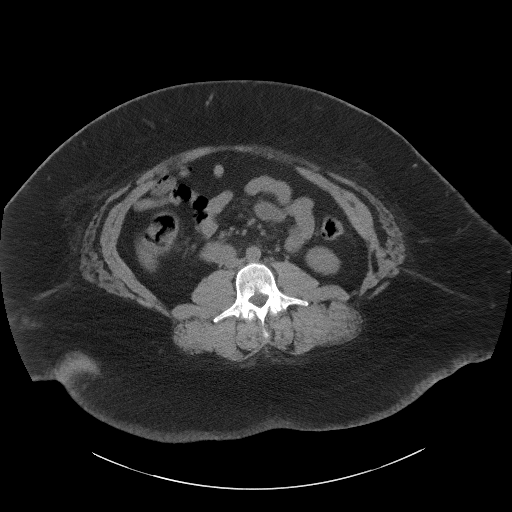
[im 59/93  soft-tissue]
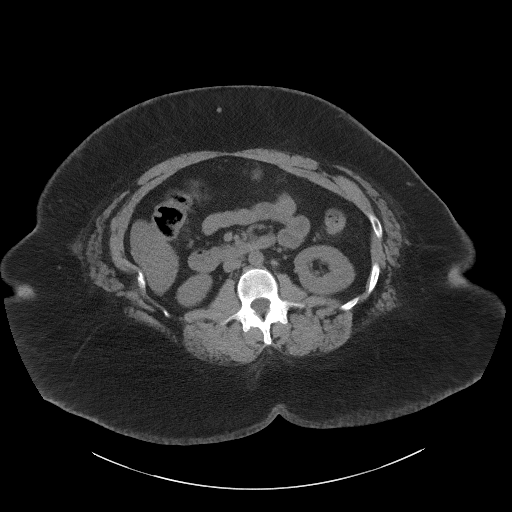
[im 59/93  bone]
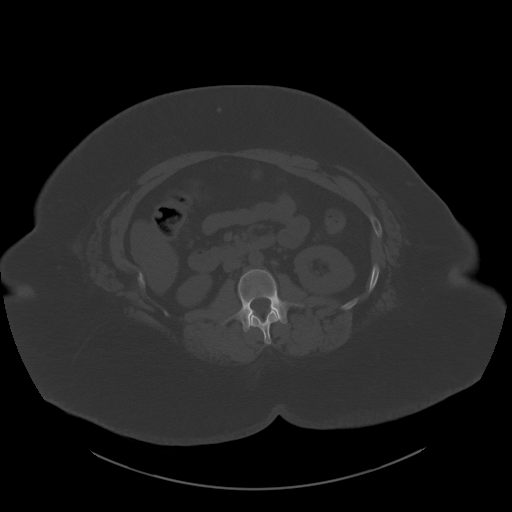
[im 67/93  soft-tissue]
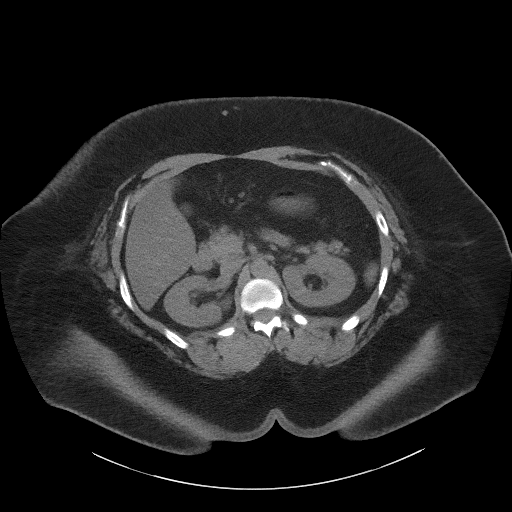
[im 74/93  soft-tissue]
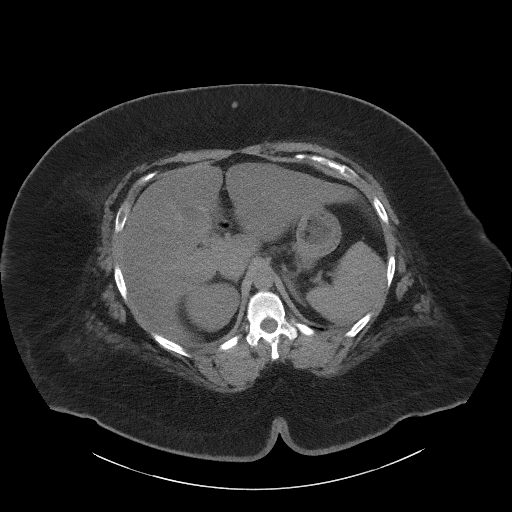
[im 81/93  soft-tissue]
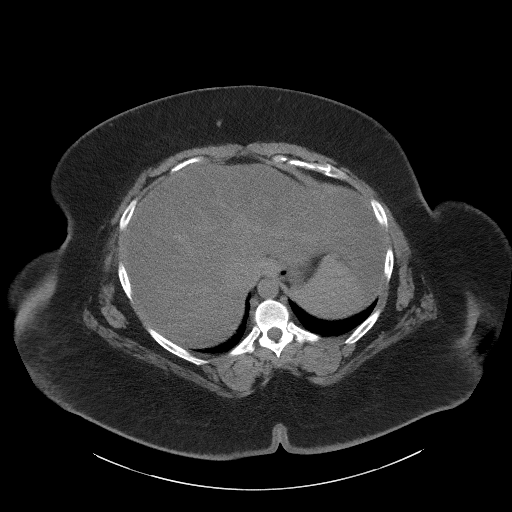
[im 89/93  soft-tissue]
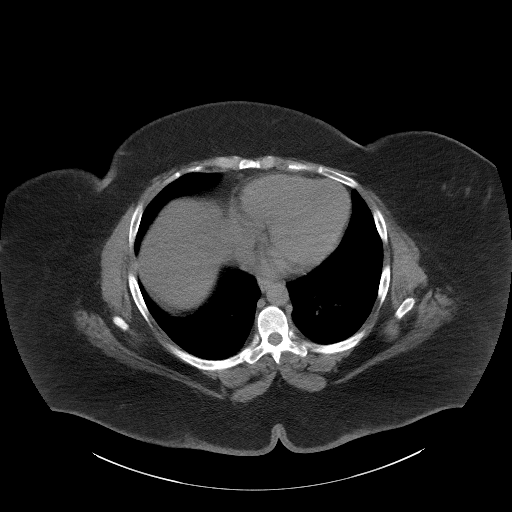

[Series 6: coronal st · coronal · 0.77mm/px · 3 of 101 slices shown]
[im 34/101  soft-tissue]
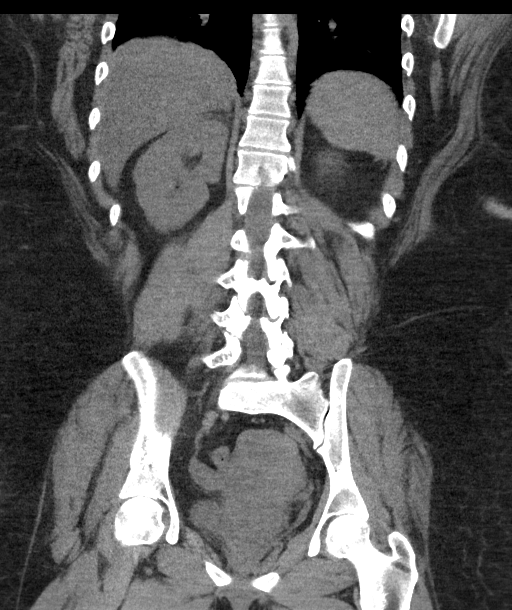
[im 45/101  soft-tissue]
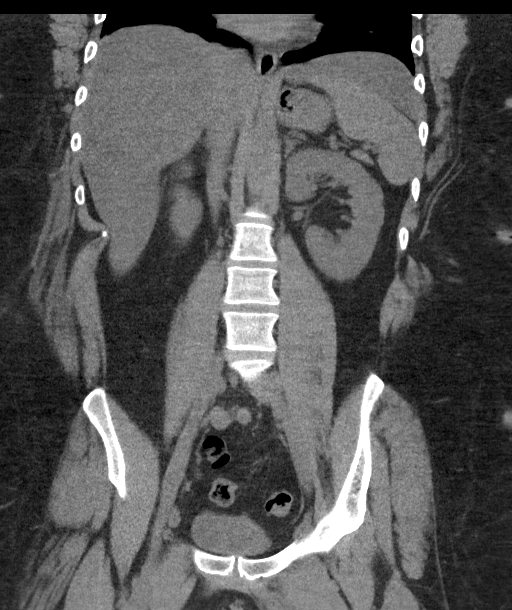
[im 56/101  soft-tissue]
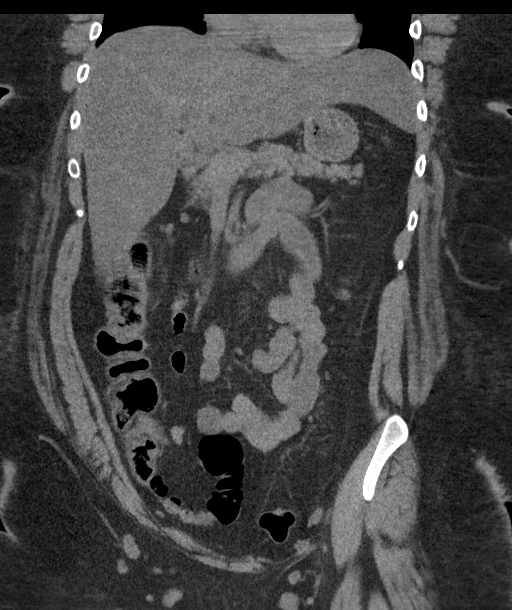

[16 of 46 positions shown; findings below may reference images not displayed]

FINDINGS: Lower chest: No acute abnormality.

Hepatobiliary: Hepatic steatosis. No focal liver abnormality. No
gallstones, gallbladder wall thickening, or biliary dilatation.

Pancreas: Unremarkable. No pancreatic ductal dilatation or
surrounding inflammatory changes.

Spleen: Normal in size without focal abnormality.

Adrenals/Urinary Tract: Adrenal glands are unremarkable. Kidneys are
normal, without renal calculi, focal lesion, or hydronephrosis.
Bladder is unremarkable.

Stomach/Bowel: Stomach is within normal limits. Appendix appears
normal. No evidence of bowel wall thickening, distention, or
inflammatory changes. Diverticulosis without evidence of
diverticulitis.

Vascular/Lymphatic: No significant vascular finding. Prominent
pelvic lymph nodes, the largest of which is a right external iliac
chain lymph node, measuring up to 1.0 cm in short axis (series 3,
image 77, unchanged compared to [DATE], possibly reactive.

Reproductive: The uterus is present. The right ovary is visualized.
The left ovary is not definitively visualized.

Other: No free fluid or free air in the abdomen or pelvis. No
abdominal wall hernia or abnormality.

Musculoskeletal: No acute osseous abnormality.
IMPRESSION: No evidence of appendicitis. No acute process in the abdomen or
pelvis.

Hepatic steatosis.

## 2021-09-24 MED ORDER — MORPHINE SULFATE (PF) 4 MG/ML IV SOLN
4.0000 mg | Freq: Once | INTRAVENOUS | Status: AC
  Administered 2021-09-24: 4 mg via INTRAVENOUS
  Filled 2021-09-24: qty 1

## 2021-09-24 MED ORDER — LOPERAMIDE HCL 2 MG PO CAPS: 2.0000 mg | ORAL_CAPSULE | Freq: Four times a day (QID) | ORAL | 0 refills | Status: DC | PRN

## 2021-09-24 MED ORDER — SODIUM CHLORIDE 0.9 % IV BOLUS
1000.0000 mL | Freq: Once | INTRAVENOUS | Status: AC
  Administered 2021-09-24: 1000 mL via INTRAVENOUS

## 2021-09-24 MED ORDER — ONDANSETRON HCL 4 MG/2ML IJ SOLN
4.0000 mg | Freq: Once | INTRAMUSCULAR | Status: AC
  Administered 2021-09-24: 4 mg via INTRAVENOUS
  Filled 2021-09-24: qty 2

## 2021-09-24 MED ORDER — DICYCLOMINE HCL 20 MG PO TABS: 20.0000 mg | ORAL_TABLET | Freq: Three times a day (TID) | ORAL | 0 refills | Status: DC | PRN

## 2021-09-24 NOTE — ED Notes (Signed)
Pt ambulated to restroom.  Two collection hats placed in commode.

## 2021-09-24 NOTE — ED Notes (Signed)
Patient transported to CT 

## 2021-09-24 NOTE — ED Triage Notes (Signed)
Diarrhea x 2 weeks, states weakness and dizziness.  States lower abdominal pain

## 2021-09-24 NOTE — ED Notes (Signed)
No success w/ collection hats.  Sample unable to be sent d/t urine and stool in same hat.

## 2021-09-24 NOTE — ED Provider Notes (Signed)
[facility] EMERGENCY DEPARTMENT Provider Note   CSN: [id number] Arrival date & time: 09/24/21  1145     History Chief Complaint  Patient presents with   Diarrhea    Felicia Bradley is a 30 y.o. female.  30 yo F with a chief complaints of diarrhea.  This been going on for a couple weeks.  The patient has tried both Pepto-Bismol and laxatives for this without improvement.  She denies fevers or chills.  Felt like her stool has been a little bit dark and was bloody at the beginning but the blood part has resolved.  Denies vaginal bleeding or discharge has had some lower abdominal discomfort usually before she needs to have a bowel movement.  Denies recent antibiotic use denies recent travel denies suspicious food intake denies anyone else near her with a similar illness.  The history is provided by the patient.  Diarrhea Quality:  Explosive and copious Severity:  Moderate Onset quality:  Gradual Duration:  2 weeks Timing:  Constant Progression:  Worsening Relieved by:  Nothing Worsened by:  Nothing Ineffective treatments:  None tried Associated symptoms: abdominal pain   Associated symptoms: no arthralgias, no chills, no fever, no headaches, no myalgias and no vomiting       Past Medical History:  Diagnosis Date   Asthma    Bipolar disorder (HCC)    Diabetes mellitus without complication (HCC)    type 2   DVT (deep venous thrombosis) (HCC) 2015   w/ recannulation per Care Everywhere   Hypertension    Obesity    Pulmonary embolism (HCC) 2015    Patient Active Problem List   Diagnosis Date Noted   Acute asthma exacerbation 05/27/2021   History of DVT (deep vein thrombosis) 03/23/2021   History of pulmonary embolus (PE) 03/23/2021   PTSD (post-traumatic stress disorder) 03/23/2021   Bipolar 1 disorder (HCC) 03/23/2021   BMI 50.0-59.9, adult (HCC) 03/23/2021   SIRS (systemic inflammatory response syndrome) (HCC) 03/19/2021   Chest pain 03/19/2021   Emesis  03/19/2021   Malingering 01/22/2020   Acute respiratory disease due to COVID-19 virus 01/18/2020   Asthma 01/18/2020   Asthma exacerbation 01/14/2020   Type 2 DM with CKD and hypertension 01/14/2020   OSA (obstructive sleep apnea) 01/14/2020   Suspected COVID-19 virus infection     Past Surgical History:  Procedure Laterality Date   CESAREAN SECTION       OB History     Gravida  4   Para      Term      Preterm      AB  2   Living  0      SAB  1   IAB  1   Ectopic      Multiple      Live Births              Family History  Problem Relation Age of Onset   Diabetes Mother    Asthma Mother    Asthma Maternal Aunt    Diabetes Maternal Aunt     Social History   Tobacco Use   Smoking status: Never   Smokeless tobacco: Never  Substance Use Topics   Alcohol use: No   Drug use: No    Home Medications Prior to Admission medications   Medication Sig Start Date End Date Taking? Authorizing Provider  albuterol (VENTOLIN HFA) 108 (90 Base) MCG/ACT inhaler Inhale 1 puff into the lungs 2 (two) times daily as needed  for wheezing or shortness of breath.  11/10/14   [provider]  ALPRAZolam Prudy Feeler) 0.5 MG tablet Take 0.5 mg by mouth 3 (three) times daily. 01/23/21   [provider]  ARIPiprazole (ABILIFY) 2 MG tablet Take 2 mg by mouth daily.    [provider]  aspirin EC 81 MG tablet Take 81 mg by mouth daily. Swallow whole.    [provider]  diclofenac Sodium (VOLTAREN) 1 % GEL Apply 2 g topically 4 (four) times daily. Patient taking differently: Apply 2 g topically 2 (two) times daily as needed (pain). 06/28/20   Hall-Potvin, [place], PA-C  EPINEPHrine 0.3 mg/0.3 mL IJ SOAJ injection Inject 0.3 mg into the muscle as needed for anaphylaxis.  11/10/14   [provider]  fluticasone-salmeterol (ADVAIR) 250-50 MCG/ACT AEPB Inhale 1 puff into the lungs in the morning and at bedtime.    [provider]   gabapentin (NEURONTIN) 300 MG capsule Take 300 mg by mouth 3 (three) times daily.    [provider]  ipratropium-albuterol (DUONEB) 0.5-2.5 (3) MG/3ML SOLN Take 3 mLs by nebulization in the morning, at noon, and at bedtime. 07/19/19   [provider]  Multiple Vitamin (MULTIVITAMIN WITH MINERALS) TABS tablet Take 1 tablet by mouth daily.    [provider]  oxyCODONE-acetaminophen (PERCOCET) 10-325 MG tablet Take 1 tablet by mouth 3 (three) times daily. 02/24/21   [provider]  pantoprazole (PROTONIX) 40 MG tablet Take 1 tablet (40 mg total) by mouth daily. 05/30/21 06/29/21  Lynn Ito, MD    Allergies    Contrast media [iodinated diagnostic agents], Dilaudid [hydromorphone], Tramadol, Apple, Banana, Coconut oil, Dexamethasone, Other, Solu-medrol [methylprednisolone], and Toradol [ketorolac tromethamine]  Review of Systems   Review of Systems  Constitutional:  Negative for chills and fever.  HENT:  Negative for congestion and rhinorrhea.   Eyes:  Negative for redness and visual disturbance.  Respiratory:  Negative for shortness of breath and wheezing.   Cardiovascular:  Negative for chest pain and palpitations.  Gastrointestinal:  Positive for abdominal pain, diarrhea and nausea. Negative for vomiting.  Genitourinary:  Negative for dysuria and urgency.  Musculoskeletal:  Negative for arthralgias and myalgias.  Skin:  Negative for pallor and wound.  Neurological:  Negative for dizziness and headaches.   Physical Exam Updated Vital Signs BP (!) 140/99 (BP Location: Right Arm)   Pulse (!) 107   Temp 99.1 F (37.3 C) (Oral)   Resp 18   Ht 5\' 3"  (1.6 m)   Wt 136.1 kg   LMP 06/13/2021 (Approximate)   SpO2 99%   Breastfeeding Unknown   BMI 53.14 kg/m   Physical Exam Vitals and nursing note reviewed.  Constitutional:      General: She is not in acute distress.    Appearance: She is well-developed. She is not diaphoretic.     Comments: BMI 53   HENT:     Head: Normocephalic and atraumatic.  Eyes:     Pupils: Pupils are equal, round, and reactive to light.  Cardiovascular:     Rate and Rhythm: Normal rate and regular rhythm.     Heart sounds: No murmur heard.   No friction rub. No gallop.  Pulmonary:     Effort: Pulmonary effort is normal.     Breath sounds: No wheezing or rales.  Abdominal:     General: There is no distension.     Palpations: Abdomen is soft.     Tenderness: There is no abdominal  tenderness.     Comments: Benign abdominal exam  Musculoskeletal:        General: No tenderness.     Cervical back: Normal range of motion and neck supple.  Skin:    General: Skin is warm and dry.  Neurological:     Mental Status: She is alert and oriented to person, place, and time.  Psychiatric:        Behavior: Behavior normal.    ED Results / Procedures / Treatments   Labs (all labs ordered are listed, but only abnormal results are displayed) Labs Reviewed  CBC WITH DIFFERENTIAL/PLATELET  COMPREHENSIVE METABOLIC PANEL  LIPASE, BLOOD  MAGNESIUM  PREGNANCY, URINE    EKG None  Radiology No results found.  Procedures Procedures   Medications Ordered in ED Medications  sodium chloride 0.9 % bolus 1,000 mL (has no administration in time range)  morphine 4 MG/ML injection 4 mg (has no administration in time range)  ondansetron (ZOFRAN) injection 4 mg (has no administration in time range)    ED Course  I have reviewed the triage vital signs and the nursing notes.  Pertinent labs & imaging results that were available during my care of the patient were reviewed by me and considered in my medical decision making (see chart for details).    MDM Rules/Calculators/A&P                           30 yo F with a chief complaints of diarrhea this is been going on for a couple weeks.  Patient tried laxatives as well as Pepto without improvement.  She has benign abdominal exam for me.  We will obtain laboratory  evaluation to assess for electrolyte dysfunction.  Treat pain and nausea.  Reassess.  Signed out to Dr. Rubin Payor, please see his note for further details of care in the ED.  The patients results and plan were reviewed and discussed.   Any x-rays performed were independently reviewed by myself.   Differential diagnosis were considered with the presenting HPI.  Medications  sodium chloride 0.9 % bolus 1,000 mL (0 mLs Intravenous Stopped 09/24/21 2001)  morphine 4 MG/ML injection 4 mg (4 mg Intravenous Given 09/24/21 1432)  ondansetron (ZOFRAN) injection 4 mg (4 mg Intravenous Given 09/24/21 1431)    Vitals:   09/24/21 1415 09/24/21 1715 09/24/21 1815 09/24/21 2001  BP: 118/82 120/76 97/72 (!) 116/93  Pulse: 96 (!) 108 99 (!) 108  Resp: 20 18 16 16   Temp:    98.9 F (37.2 C)  TempSrc:    Oral  SpO2: 97% 100% 98% 96%  Weight:      Height:        Final diagnoses:  Diarrhea, unspecified type  Abdominal pain, unspecified abdominal location    Final Clinical Impression(s) / ED Diagnoses Final diagnoses:  None    Rx / DC Orders ED Discharge Orders     None        , DO 09/27/21 0701

## 2021-09-24 NOTE — ED Notes (Signed)
Pt has only had 1 very small episode of diarrhea since she has arrived.

## 2021-09-24 NOTE — ED Notes (Signed)
Labs redrawn

## 2021-09-25 NOTE — ED Provider Notes (Signed)
  Physical Exam  BP (!) 116/93 (BP Location: Right Arm)   Pulse (!) 108   Temp 98.9 F (37.2 C) (Oral)   Resp 16   Ht 5\' 3"  (1.6 m)   Wt 136.1 kg   LMP 06/13/2021 (Approximate)   SpO2 96%   Breastfeeding Unknown   BMI 53.14 kg/m   Physical Exam  ED Course/Procedures     Procedures  MDM  Received patient in signout.  Diarrhea.  Stool samples have been ordered but in the time after the been ordered patient did not have any more bowel movements.  CT scan done due to tenderness and reassuring.  Discharge.  Outpatient follow-up as needed       08/14/2021, MD 09/25/21 2233

## 2021-10-15 ENCOUNTER — Encounter (HOSPITAL_BASED_OUTPATIENT_CLINIC_OR_DEPARTMENT_OTHER): Payer: Self-pay | Admitting: Emergency Medicine

## 2021-10-15 ENCOUNTER — Emergency Department (HOSPITAL_BASED_OUTPATIENT_CLINIC_OR_DEPARTMENT_OTHER)
Admission: EM | Admit: 2021-10-15 | Discharge: 2021-10-15 | Disposition: A | Discharge status: Home / Self Care | Payer: Medicaid Other | Attending: Emergency Medicine | Admitting: Emergency Medicine

## 2021-10-15 ENCOUNTER — Emergency Department (HOSPITAL_BASED_OUTPATIENT_CLINIC_OR_DEPARTMENT_OTHER): Payer: Medicaid Other

## 2021-10-15 ENCOUNTER — Other Ambulatory Visit: Payer: Self-pay

## 2021-10-15 DIAGNOSIS — I129 Hypertensive chronic kidney disease with stage 1 through stage 4 chronic kidney disease, or unspecified chronic kidney disease: Secondary | ICD-10-CM | POA: Insufficient documentation

## 2021-10-15 DIAGNOSIS — N189 Chronic kidney disease, unspecified: Secondary | ICD-10-CM | POA: Insufficient documentation

## 2021-10-15 DIAGNOSIS — F419 Anxiety disorder, unspecified: Secondary | ICD-10-CM | POA: Insufficient documentation

## 2021-10-15 DIAGNOSIS — Z7951 Long term (current) use of inhaled steroids: Secondary | ICD-10-CM | POA: Insufficient documentation

## 2021-10-15 DIAGNOSIS — E1122 Type 2 diabetes mellitus with diabetic chronic kidney disease: Secondary | ICD-10-CM | POA: Insufficient documentation

## 2021-10-15 DIAGNOSIS — R42 Dizziness and giddiness: Secondary | ICD-10-CM | POA: Diagnosis not present

## 2021-10-15 DIAGNOSIS — Z7982 Long term (current) use of aspirin: Secondary | ICD-10-CM | POA: Diagnosis not present

## 2021-10-15 DIAGNOSIS — R202 Paresthesia of skin: Secondary | ICD-10-CM | POA: Insufficient documentation

## 2021-10-15 DIAGNOSIS — R079 Chest pain, unspecified: Secondary | ICD-10-CM | POA: Diagnosis present

## 2021-10-15 DIAGNOSIS — Z87891 Personal history of nicotine dependence: Secondary | ICD-10-CM | POA: Diagnosis not present

## 2021-10-15 DIAGNOSIS — Z8616 Personal history of COVID-19: Secondary | ICD-10-CM | POA: Insufficient documentation

## 2021-10-15 DIAGNOSIS — J45909 Unspecified asthma, uncomplicated: Secondary | ICD-10-CM | POA: Insufficient documentation

## 2021-10-15 DIAGNOSIS — J40 Bronchitis, not specified as acute or chronic: Secondary | ICD-10-CM | POA: Diagnosis not present

## 2021-10-15 LAB — TROPONIN I (HIGH SENSITIVITY)
Troponin I (High Sensitivity): 2 ng/L (ref <18)
Troponin I (High Sensitivity): 2 ng/L (ref <18)

## 2021-10-15 LAB — BASIC METABOLIC PANEL
Anion gap: 6 (ref 5–15)
BUN: 9 mg/dL (ref 6–20)
CO2: 25 mmol/L (ref 22–32)
Calcium: 8.4 mg/dL — ABNORMAL LOW (ref 8.9–10.3)
Chloride: 109 mmol/L (ref 98–111)
Creatinine, Ser: 0.64 mg/dL (ref 0.44–1.00)
GFR, Estimated: >60 mL/min (ref >60)
Glucose, Bld: 96 mg/dL (ref 70–99)
Potassium: 3.9 mmol/L (ref 3.5–5.1)
Sodium: 140 mmol/L (ref 135–145)

## 2021-10-15 LAB — CBC
HCT: 37.7 % (ref 36.0–46.0)
Hemoglobin: 11.6 g/dL — ABNORMAL LOW (ref 12.0–15.0)
MCH: 27 pg (ref 26.0–34.0)
MCHC: 30.8 g/dL (ref 30.0–36.0)
MCV: 87.9 fL (ref 80.0–100.0)
Platelets: 385 10*3/uL (ref 150–400)
RBC: 4.29 MIL/uL (ref 3.87–5.11)
RDW: 15.2 % (ref 11.5–15.5)
WBC: 7 10*3/uL (ref 4.0–10.5)
nRBC: 0 % (ref 0.0–0.2)

## 2021-10-15 LAB — D-DIMER, QUANTITATIVE
D-Dimer, Quant: <0.27 ug/mL-FEU (ref 0.00–0.50)
D-Dimer, Quant: <0.27 ug/mL-FEU (ref 0.00–0.50)

## 2021-10-15 IMAGING — DX DG CHEST 2V
2 series · 3 of 3 positions shown · non-contrast
Comparison: [DATE]

CLINICAL DATA: Chest pain and cough

EXAM:
CHEST - 2 VIEW

[Series 1: chest pa · 0.14mm/px · 2 of 2 slices shown]
[im 1/2]
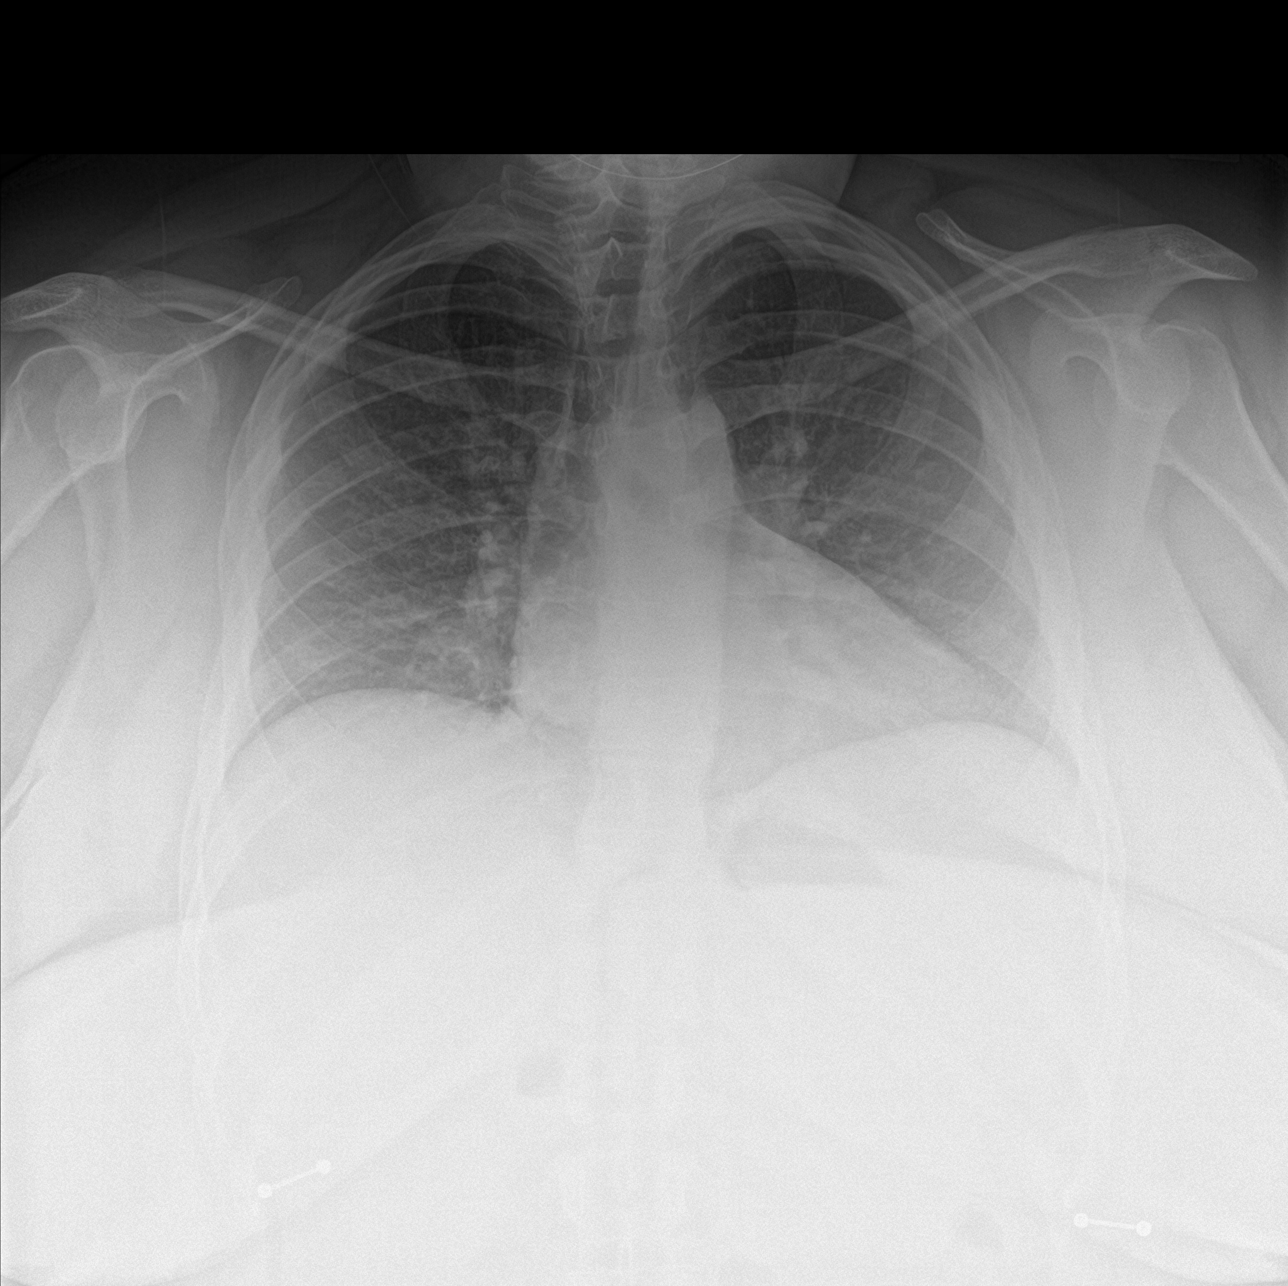
[im 2/2]
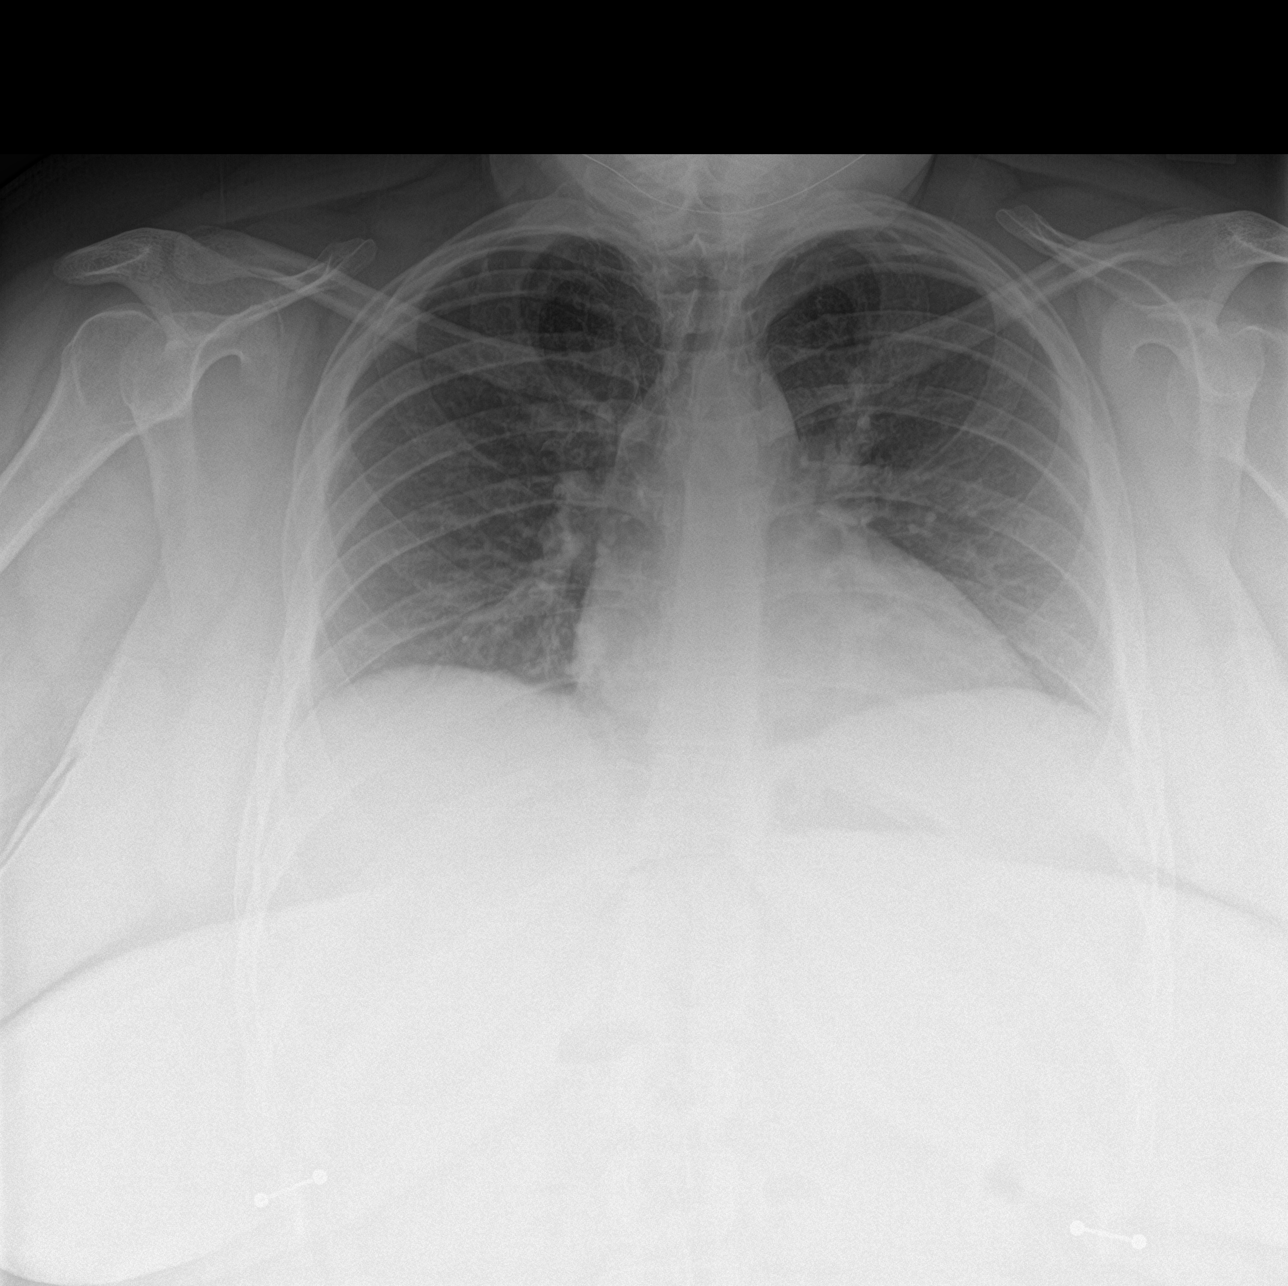

[chest lat]
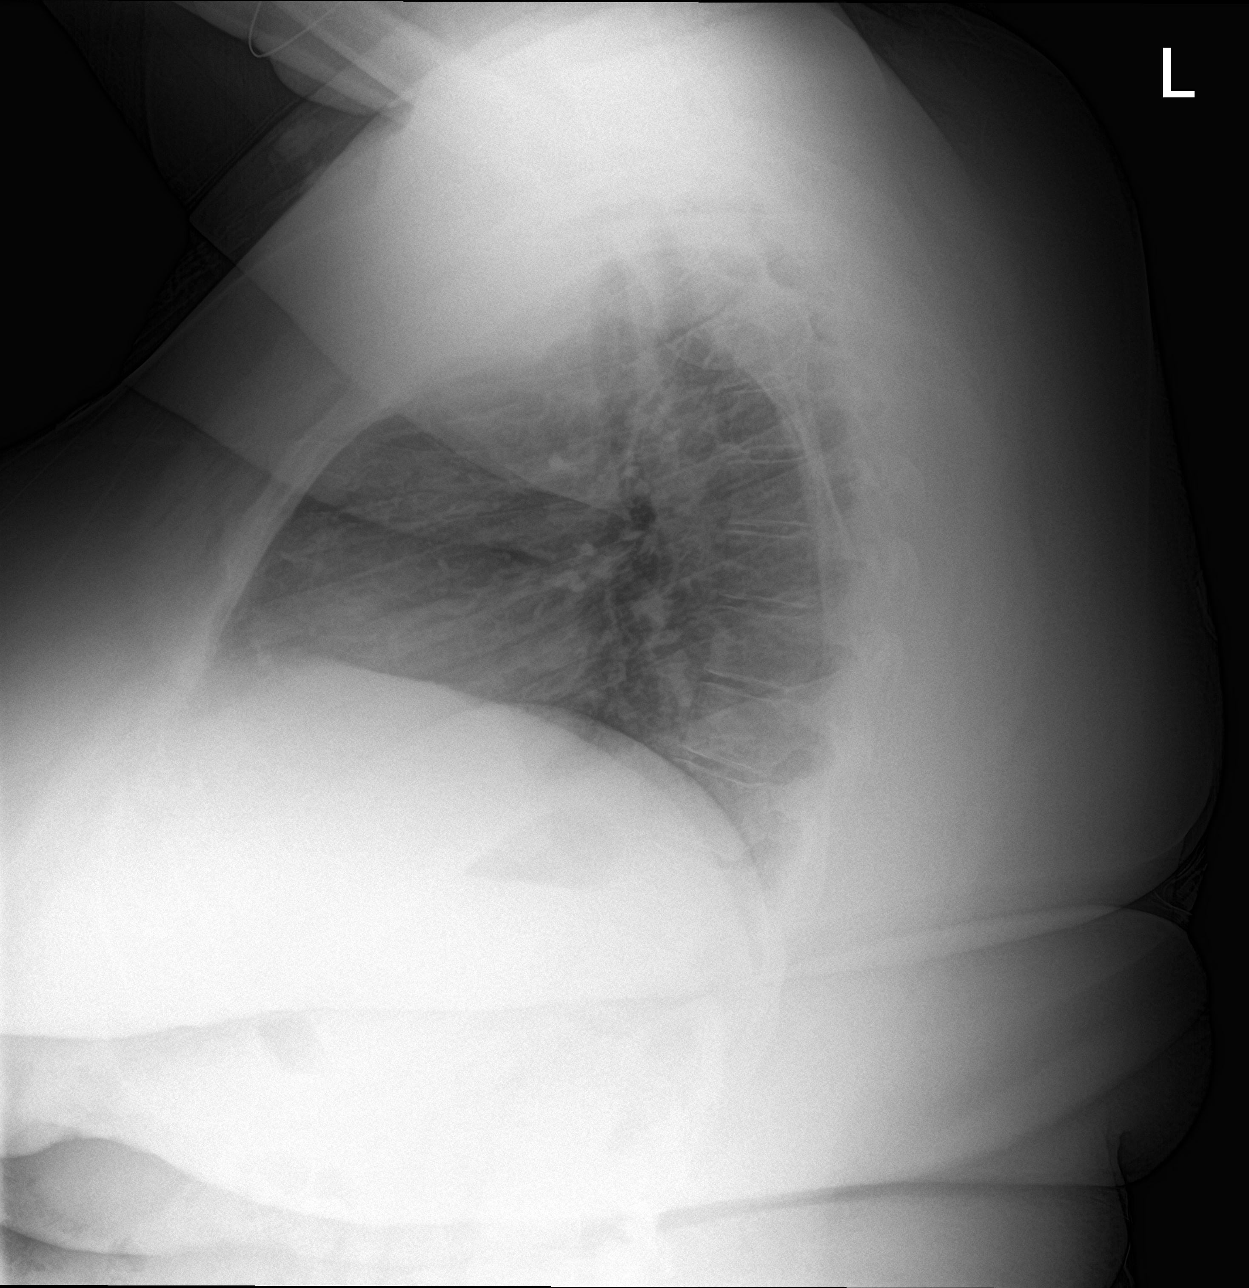

[3 of 3 positions shown; findings below may reference images not displayed]

FINDINGS: The heart size and mediastinal contours are within normal limits.
Both lungs are clear. The visualized skeletal structures are
unremarkable.
IMPRESSION: No active cardiopulmonary disease.

## 2021-10-15 MED ORDER — ALBUTEROL SULFATE HFA 108 (90 BASE) MCG/ACT IN AERS
1.0000 | INHALATION_SPRAY | Freq: Once | RESPIRATORY_TRACT | Status: AC
  Administered 2021-10-15: 1 via RESPIRATORY_TRACT
  Filled 2021-10-15: qty 6.7

## 2021-10-15 MED ORDER — PREDNISONE 20 MG PO TABS: 40.0000 mg | ORAL_TABLET | Freq: Every day | ORAL | 0 refills | Status: AC

## 2021-10-15 MED ORDER — ONDANSETRON HCL 4 MG/2ML IJ SOLN
4.0000 mg | Freq: Once | INTRAMUSCULAR | Status: AC
  Administered 2021-10-15: 4 mg via INTRAVENOUS
  Filled 2021-10-15: qty 2

## 2021-10-15 MED ORDER — METHYLPREDNISOLONE SODIUM SUCC 125 MG IJ SOLR
125.0000 mg | Freq: Once | INTRAMUSCULAR | Status: AC
  Administered 2021-10-15: 125 mg via INTRAVENOUS
  Filled 2021-10-15: qty 2

## 2021-10-15 NOTE — ED Provider Notes (Addendum)
[facility]ENCY DEPARTMENT Provider Note   CSN: [id number] Arrival date & time: 10/15/21  1343    History Chief Complaint  Patient presents with   Chest Pain    Felicia Bradley is a 30 y.o. female w/ PMHx T2DM, asthma, PE present today with 45 minutes of centralized chest pain with left arm numbness. She is having chest pain but no longer arm pain. It is a sharp pain over her left chest wall. For 5-10 mins she initially felt dizzy and as if she was going to pass out. It hurts when she breathes deeply. Has associated headache. Denies nausea, vomiting, pain radiating to jaw, and vision changes.   The history is provided by the patient.  Chest Pain Pain location:  L lateral chest Pain quality: sharp   Pain radiates to:  L shoulder Pain severity:  Severe Onset quality:  Sudden Duration:  5 minutes Timing:  Constant Progression:  Unchanged Chronicity:  New Context: breathing   Relieved by:  Nothing Ineffective treatments:  Aspirin Associated symptoms: anxiety, cough, headache and numbness   Associated symptoms: no abdominal pain, no dizziness, no fever, no nausea and no vomiting   Risk factors: diabetes mellitus, obesity and prior DVT/PE       Past Medical History:  Diagnosis Date   Asthma    Bipolar disorder ([place])    Diabetes mellitus without complication ([place])    type 2   DVT (deep venous thrombosis) ([place]) 2015   w/ recannulation per Care Everywhere   Hypertension    Obesity    Pulmonary embolism ([place]) 2015    Patient Active Problem List   Diagnosis Date Noted   Acute asthma exacerbation 05/27/2021   History of DVT (deep vein thrombosis) 03/23/2021   History of pulmonary embolus (PE) 03/23/2021   PTSD (post-traumatic stress disorder) 03/23/2021   Bipolar 1 disorder ([place]) 03/23/2021   BMI 50.0-59.9, adult ([place]) 03/23/2021   SIRS (systemic inflammatory response syndrome) ([place]) 03/19/2021   Chest pain 03/19/2021   Emesis 03/19/2021   Malingering  01/22/2020   Acute respiratory disease due to COVID-19 virus 01/18/2020   Asthma 01/18/2020   Asthma exacerbation 01/14/2020   Type 2 DM with CKD and hypertension 01/14/2020   OSA (obstructive sleep apnea) 01/14/2020   Suspected COVID-19 virus infection     Past Surgical History:  Procedure Laterality Date   CESAREAN SECTION       OB History     Gravida  4   Para      Term      Preterm      AB  2   Living  0      SAB  1   IAB  1   Ectopic      Multiple      Live Births              Family History  Problem Relation Age of Onset   Diabetes Mother    Asthma Mother    Asthma Maternal Aunt    Diabetes Maternal Aunt     Social History   Tobacco Use   Smoking status: Former    Types: Cigarettes   Smokeless tobacco: Never  Substance Use Topics   Alcohol use: No   Drug use: No    Home Medications Prior to Admission medications   Medication Sig Start Date End Date Taking? Authorizing Provider  albuterol (VENTOLIN HFA) 108 (90 Base) MCG/ACT inhaler Inhale 1 puff into the lungs 2 (two)  times daily as needed for wheezing or shortness of breath.  11/10/14   [provider]  ALPRAZolam [name]) 0.5 MG tablet Take 0.5 mg by mouth 3 (three) times daily. 01/23/21   [provider]  ARIPiprazole (ABILIFY) 2 MG tablet Take 2 mg by mouth daily.    [provider]  aspirin EC 81 MG tablet Take 81 mg by mouth daily. Swallow whole.    [provider]  diclofenac Sodium (VOLTAREN) 1 % GEL Apply 2 g topically 4 (four) times daily. Patient taking differently: Apply 2 g topically 2 (two) times daily as needed (pain). 06/28/20   Hall-Potvin, [place], PA-C  dicyclomine (BENTYL) 20 MG tablet Take 1 tablet (20 mg total) by mouth 3 (three) times daily as needed for spasms. 09/24/21   [name], MD  EPINEPHrine 0.3 mg/0.3 mL IJ SOAJ injection Inject 0.3 mg into the muscle as needed for anaphylaxis.  11/10/14   [provider]   fluticasone-salmeterol (ADVAIR) 250-50 MCG/ACT AEPB Inhale 1 puff into the lungs in the morning and at bedtime.    [provider]  gabapentin (NEURONTIN) 300 MG capsule Take 300 mg by mouth 3 (three) times daily.    [provider]  ipratropium-albuterol (DUONEB) 0.5-2.5 (3) MG/3ML SOLN Take 3 mLs by nebulization in the morning, at noon, and at bedtime. 07/19/19   [provider]  loperamide (IMODIUM) 2 MG capsule Take 1 capsule (2 mg total) by mouth 4 (four) times daily as needed for diarrhea or loose stools. 09/24/21   [name], MD  Multiple Vitamin (MULTIVITAMIN WITH MINERALS) TABS tablet Take 1 tablet by mouth daily.    [provider]  oxyCODONE-acetaminophen (PERCOCET) 10-325 MG tablet Take 1 tablet by mouth 3 (three) times daily. 02/24/21   [provider]  pantoprazole (PROTONIX) 40 MG tablet Take 1 tablet (40 mg total) by mouth daily. 05/30/21 06/29/21  [name], MD    Allergies    Contrast media [iodinated diagnostic agents], Dilaudid [hydromorphone], Tramadol, Apple, Banana, Coconut oil, Dexamethasone, Other, Solu-medrol [methylprednisolone], and Toradol [ketorolac tromethamine]  Review of Systems   Review of Systems  Constitutional:  Negative for activity change, appetite change and fever.  HENT:  Negative for congestion.   Eyes:  Negative for visual disturbance.  Respiratory:  Positive for cough.   Cardiovascular:  Positive for chest pain.  Gastrointestinal:  Negative for abdominal pain, nausea and vomiting.  Endocrine: Negative for polyuria.  Genitourinary:  Negative for decreased urine volume and difficulty urinating.  Neurological:  Positive for numbness and headaches. Negative for dizziness.  Psychiatric/Behavioral:  The patient is nervous/anxious.    Physical Exam Updated Vital Signs BP 113/85 (BP Location: Right Arm)   Pulse 79   Temp 98.6 F (37 C) (Oral)   Resp 20   Ht 5\' 3"  (1.6 m)   Wt 135.6 kg   LMP  10/06/2021   SpO2 99%   BMI 52.97 kg/m   Physical Exam Vitals reviewed.  Constitutional:      General: She is not in acute distress.    Appearance: She is obese. She is ill-appearing.  HENT:     Head: Normocephalic.  Eyes:     Extraocular Movements: Extraocular movements intact.     Pupils: Pupils are equal, round, and reactive to light.  Cardiovascular:     Rate and Rhythm: Normal rate and regular rhythm.     Heart sounds: Normal heart sounds. No murmur heard.   No S3 or S4 sounds.  Pulmonary:     Effort: Pulmonary effort is normal. No respiratory distress.     Breath sounds: Examination of the right-lower field reveals wheezing. Examination of the left-lower field reveals wheezing. Wheezing present.  Chest:     Chest wall: No tenderness or crepitus.  Abdominal:     General: Bowel sounds are normal. There is no abdominal bruit.     Palpations: Abdomen is soft.  Musculoskeletal:     Cervical back: Neck supple.  Neurological:     Mental Status: She is alert and oriented to person, place, and time.  Psychiatric:        Mood and Affect: Mood normal.        Behavior: Behavior normal.    ED Results / Procedures / Treatments   Labs (all labs ordered are listed, but only abnormal results are displayed) Labs Reviewed  CBC - Abnormal; Notable for the following components:      Result Value   Hemoglobin 11.6 (*)    All other components within normal limits  PREGNANCY, URINE  BASIC METABOLIC PANEL  D-DIMER, QUANTITATIVE  TROPONIN I (HIGH SENSITIVITY)  TROPONIN I (HIGH SENSITIVITY)    EKG None  Radiology DG Chest 2 View  Result Date: 10/15/2021 CLINICAL DATA:  Chest pain and cough EXAM: CHEST - 2 VIEW COMPARISON:  08/24/2021 FINDINGS: The heart size and mediastinal contours are within normal limits. Both lungs are clear. The visualized skeletal structures are unremarkable. IMPRESSION: No active cardiopulmonary disease. Electronically Signed   By: Nelson Chimes M.D.   On:  10/15/2021 14:32    Procedures Procedures   Medications Ordered in ED Medications  methylPREDNISolone sodium succinate (SOLU-MEDROL) 125 mg/2 mL injection 125 mg (125 mg Intravenous Given 10/15/21 1610)  ondansetron (ZOFRAN) injection 4 mg (4 mg Intravenous Given 10/15/21 1556)  albuterol (VENTOLIN HFA) 108 (90 Base) MCG/ACT inhaler 1 puff (1 puff Inhalation Given 10/15/21 1551)    ED Course  I have reviewed the triage vital signs and the nursing notes.  Pertinent labs & imaging results that were available during my care of the patient were reviewed by me and considered in my medical decision making (see chart for details).  1530-Reviewed CXR and EKG. Initially tachycardic upon presentation HR 132, now normal rate HR 79. Unremarkable to acute cardiac event. Patient continues to endorse chest pain mostly with deep breathing.   1545-Reviewed trop of 2. Lower suspicion for cardiac etiology. Discussed with Dr. Darl Householder and found to have lower respiratory wheezing. Plan to tx for bronchitis.   Prior to discharge d-dimer negative. Patient with improving wheezing. Discussed PCP follow up and continued steroid tx for additional 4 days.  Clinical Course as of 10/15/21 2248  Wed Oct 15, 2021  1522 Troponin I (High Sensitivity): 2 [SA]  1522 Troponin I (High Sensitivity) [SA]    Clinical Course User Index [SA] Autry-Lott, Naaman Plummer, DO    MDM Rules/Calculators/A&P                          30 yo F w/ PMHx  presenting with chest pain with deep breathing. Cardiac etiology ruled out with work up. Concern for bronchitis. Patient wheezing and chest discomfort improved s/p albuterol and solu-medrol x1. Discussed PCP follow up and return precautions with patient. She is agreeable to be discharged home with continue treatment with prednisone for additional 4 days.   Final Clinical Impression(s) / ED Diagnoses Final diagnoses:  Bronchitis    Rx / DC  Orders ED Discharge Orders          Ordered     predniSONE (DELTASONE) 20 MG tablet  Daily with breakfast        10/15/21 1711             Autry-Lott, Kennon Encinas, DO 10/15/21 2247    Autry-Lott, Randa Evens, DO 10/15/21 2248    Charlynne Pander, MD 10/16/21 [phone]

## 2021-10-15 NOTE — Discharge Instructions (Addendum)
Continue steroids for additional 4 days and albuterol as needed. Follow up with primary care doctor in 1-2 days.

## 2021-10-15 NOTE — ED Notes (Signed)
Pt vomited after administration of Solumedrol

## 2021-10-15 NOTE — ED Triage Notes (Signed)
Pt arrives pov with c/o left side CP and dizziness that started 45 mins pta. Pt denies N/V, reports radiation to back. Pt deneis cough or fever. Endorse 81 mg aspirin pta

## 2021-12-01 ENCOUNTER — Emergency Department (HOSPITAL_BASED_OUTPATIENT_CLINIC_OR_DEPARTMENT_OTHER): Payer: Medicaid Other

## 2021-12-01 ENCOUNTER — Emergency Department (HOSPITAL_BASED_OUTPATIENT_CLINIC_OR_DEPARTMENT_OTHER)
Admission: EM | Admit: 2021-12-01 | Discharge: 2021-12-01 | Disposition: A | Discharge status: Home / Self Care | Payer: Medicaid Other | Attending: Emergency Medicine | Admitting: Emergency Medicine

## 2021-12-01 ENCOUNTER — Encounter (HOSPITAL_BASED_OUTPATIENT_CLINIC_OR_DEPARTMENT_OTHER): Payer: Self-pay

## 2021-12-01 ENCOUNTER — Other Ambulatory Visit: Payer: Self-pay

## 2021-12-01 DIAGNOSIS — I129 Hypertensive chronic kidney disease with stage 1 through stage 4 chronic kidney disease, or unspecified chronic kidney disease: Secondary | ICD-10-CM | POA: Diagnosis not present

## 2021-12-01 DIAGNOSIS — E1122 Type 2 diabetes mellitus with diabetic chronic kidney disease: Secondary | ICD-10-CM | POA: Insufficient documentation

## 2021-12-01 DIAGNOSIS — R059 Cough, unspecified: Secondary | ICD-10-CM | POA: Diagnosis present

## 2021-12-01 DIAGNOSIS — Z8616 Personal history of COVID-19: Secondary | ICD-10-CM | POA: Insufficient documentation

## 2021-12-01 DIAGNOSIS — Z87891 Personal history of nicotine dependence: Secondary | ICD-10-CM | POA: Diagnosis not present

## 2021-12-01 DIAGNOSIS — Z7982 Long term (current) use of aspirin: Secondary | ICD-10-CM | POA: Insufficient documentation

## 2021-12-01 DIAGNOSIS — J45909 Unspecified asthma, uncomplicated: Secondary | ICD-10-CM | POA: Insufficient documentation

## 2021-12-01 DIAGNOSIS — N189 Chronic kidney disease, unspecified: Secondary | ICD-10-CM | POA: Insufficient documentation

## 2021-12-01 DIAGNOSIS — U071 COVID-19: Secondary | ICD-10-CM

## 2021-12-01 DIAGNOSIS — Z7951 Long term (current) use of inhaled steroids: Secondary | ICD-10-CM | POA: Insufficient documentation

## 2021-12-01 LAB — CBC WITH DIFFERENTIAL/PLATELET
Abs Immature Granulocytes: 0.02 10*3/uL (ref 0.00–0.07)
Basophils Absolute: 0 10*3/uL (ref 0.0–0.1)
Basophils Relative: 0 %
Eosinophils Absolute: 0.1 10*3/uL (ref 0.0–0.5)
Eosinophils Relative: 1 %
HCT: 35.9 % — ABNORMAL LOW (ref 36.0–46.0)
Hemoglobin: 11 g/dL — ABNORMAL LOW (ref 12.0–15.0)
Immature Granulocytes: 0 %
Lymphocytes Relative: 27 %
Lymphs Abs: 1.8 10*3/uL (ref 0.7–4.0)
MCH: 26.3 pg (ref 26.0–34.0)
MCHC: 30.6 g/dL (ref 30.0–36.0)
MCV: 85.9 fL (ref 80.0–100.0)
Monocytes Absolute: 0.3 10*3/uL (ref 0.1–1.0)
Monocytes Relative: 4 %
Neutro Abs: 4.7 10*3/uL (ref 1.7–7.7)
Neutrophils Relative %: 68 %
Platelets: 337 10*3/uL (ref 150–400)
RBC: 4.18 MIL/uL (ref 3.87–5.11)
RDW: 15.3 % (ref 11.5–15.5)
WBC: 6.8 10*3/uL (ref 4.0–10.5)
nRBC: 0 % (ref 0.0–0.2)

## 2021-12-01 LAB — BASIC METABOLIC PANEL
Anion gap: 6 (ref 5–15)
BUN: 9 mg/dL (ref 6–20)
CO2: 29 mmol/L (ref 22–32)
Calcium: 9.3 mg/dL (ref 8.9–10.3)
Chloride: 106 mmol/L (ref 98–111)
Creatinine, Ser: 0.58 mg/dL (ref 0.44–1.00)
GFR, Estimated: >60 mL/min (ref >60)
Glucose, Bld: 125 mg/dL — ABNORMAL HIGH (ref 70–99)
Potassium: 3.7 mmol/L (ref 3.5–5.1)
Sodium: 141 mmol/L (ref 135–145)

## 2021-12-01 LAB — RESP PANEL BY RT-PCR (FLU A&B, COVID) ARPGX2
Influenza A by PCR: NEGATIVE
Influenza B by PCR: NEGATIVE
SARS Coronavirus 2 by RT PCR: POSITIVE — AB

## 2021-12-01 LAB — D-DIMER, QUANTITATIVE: D-Dimer, Quant: <0.27 ug/mL-FEU (ref 0.00–0.50)

## 2021-12-01 IMAGING — DX DG CHEST 1V PORT
1 series · 1 of 1 positions shown · non-contrast
Comparison: [DATE]

CLINICAL DATA: Shortness of breath, cough, and chest pain.

EXAM:
PORTABLE CHEST 1 VIEW

[chest]
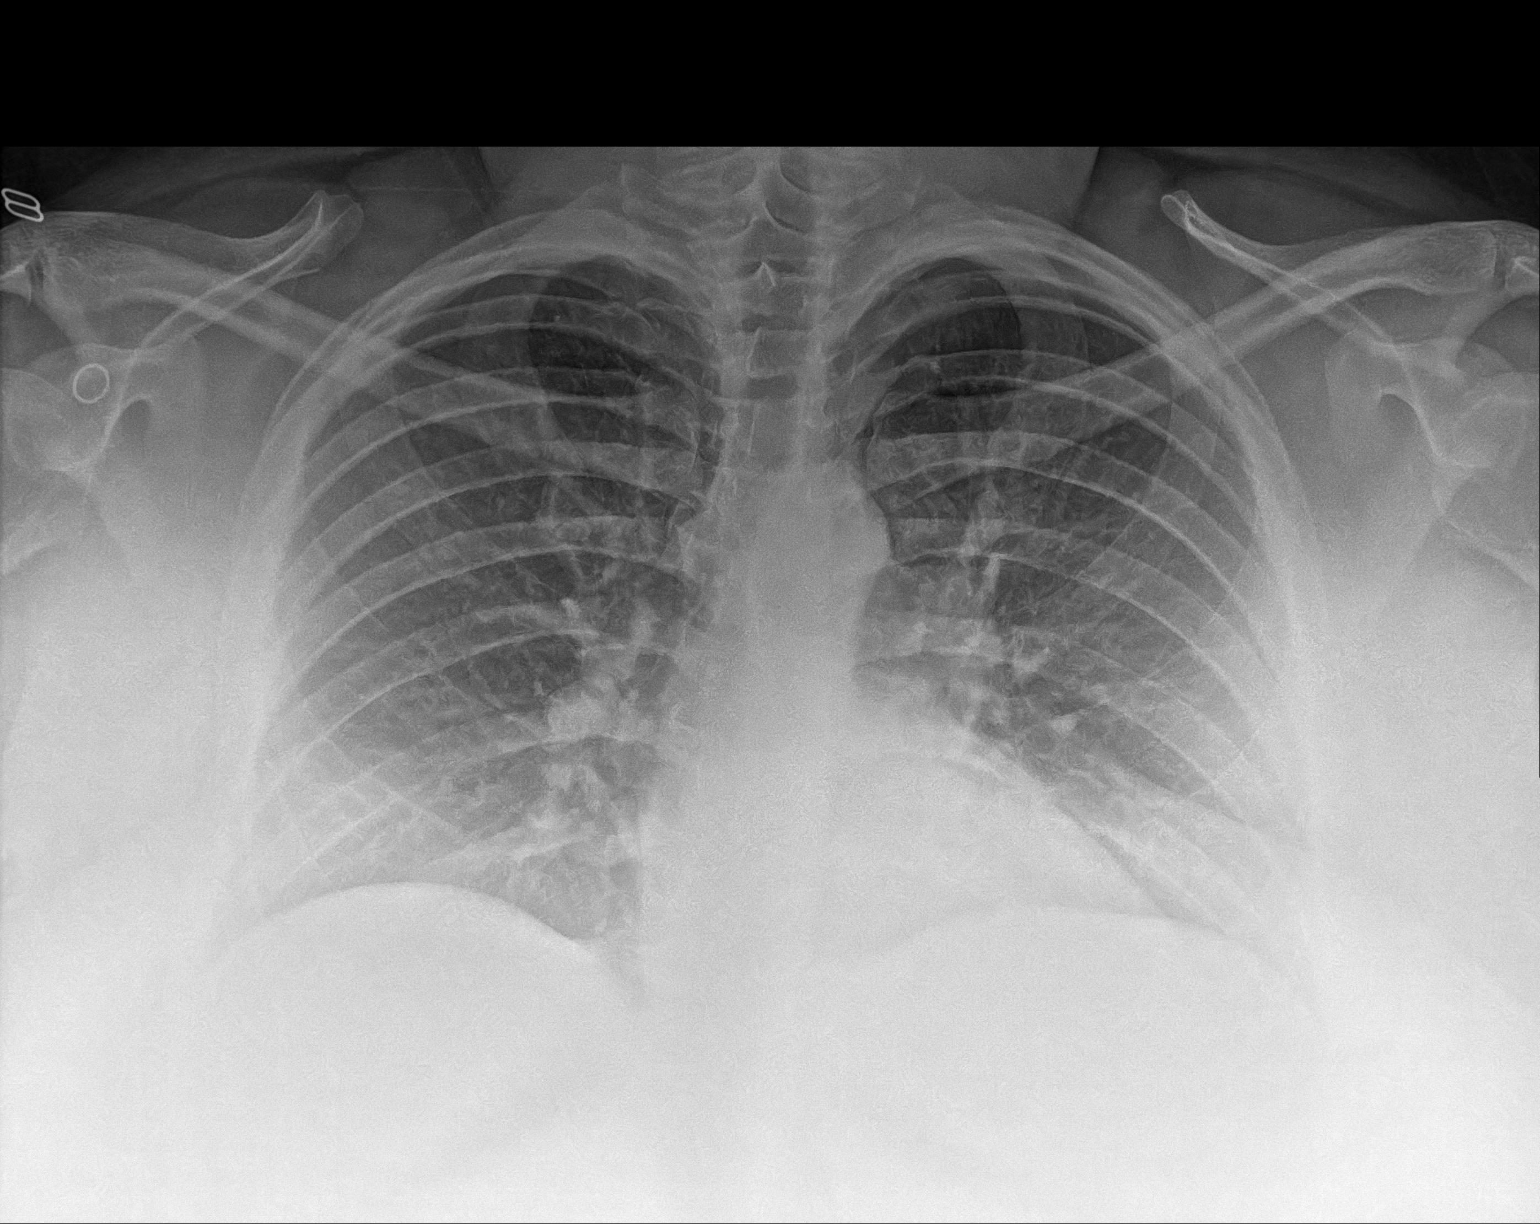

[1 of 1 positions shown; findings below may reference images not displayed]

FINDINGS: The cardiomediastinal silhouette is within normal limits. The lungs
are hypoinflated. No airspace consolidation, edema, pleural
effusion, or pneumothorax is identified. No acute osseous
abnormality is seen.
IMPRESSION: No active disease.

## 2021-12-01 MED ORDER — OXYCODONE-ACETAMINOPHEN 5-325 MG PO TABS
1.0000 | ORAL_TABLET | Freq: Once | ORAL | Status: AC
  Administered 2021-12-01: 20:00:00 1 via ORAL
  Filled 2021-12-01: qty 1

## 2021-12-01 MED ORDER — BENZONATATE 100 MG PO CAPS: 100.0000 mg | ORAL_CAPSULE | Freq: Three times a day (TID) | ORAL | 0 refills | Status: AC

## 2021-12-01 MED ORDER — MOLNUPIRAVIR EUA 200MG CAPSULE: 4.0000 | ORAL_CAPSULE | Freq: Two times a day (BID) | ORAL | 0 refills | Status: AC

## 2021-12-01 NOTE — ED Triage Notes (Signed)
Patient here POV from Home with SOB.  Patient states she has been having Body Aches, Productive Cough and Mid-Left CP for approximately 2-4 days.   NAD Noted during Triage. A&Ox4. GCS 15. Ambulatory.

## 2021-12-01 NOTE — ED Provider Notes (Signed)
[facility] DEPT Provider Note   CSN: [id number] Arrival date & time: 12/01/21  1552     History Chief Complaint  Patient presents with   Cough    Felicia Bradley is a 30 y.o. female.  HPI   Pt is a 30 y/o female with a h/o asthma, DM, VTE, HTN, obesity who presents to the ED today for eval of a cough that started 3 days ago. She has some pain with inspiration and some chest pain and some pleuritic pain. No hemoptysis reported. She was previously on o2 but not anymore. Denies fevers or other URI sxs.  Past Medical History:  Diagnosis Date   Asthma    Bipolar disorder ([place])    Diabetes mellitus without complication ([place])    type 2   DVT (deep venous thrombosis) ([place]) 2015   w/ recannulation per Care Everywhere   Hypertension    Obesity    Pulmonary embolism ([place]) 2015    Patient Active Problem List   Diagnosis Date Noted   Acute asthma exacerbation 05/27/2021   History of DVT (deep vein thrombosis) 03/23/2021   History of pulmonary embolus (PE) 03/23/2021   PTSD (post-traumatic stress disorder) 03/23/2021   Bipolar 1 disorder ([place]) 03/23/2021   BMI 50.0-59.9, adult ([place]) 03/23/2021   SIRS (systemic inflammatory response syndrome) ([place]) 03/19/2021   Chest pain 03/19/2021   Emesis 03/19/2021   Malingering 01/22/2020   Acute respiratory disease due to COVID-19 virus 01/18/2020   Asthma 01/18/2020   Asthma exacerbation 01/14/2020   Type 2 DM with CKD and hypertension 01/14/2020   OSA (obstructive sleep apnea) 01/14/2020   Suspected COVID-19 virus infection     Past Surgical History:  Procedure Laterality Date   CESAREAN SECTION       OB History     Gravida  4   Para      Term      Preterm      AB  2   Living  0      SAB  1   IAB  1   Ectopic      Multiple      Live Births              Family History  Problem Relation Age of Onset   Diabetes Mother    Asthma Mother    Asthma Maternal Aunt    Diabetes  Maternal Aunt     Social History   Tobacco Use   Smoking status: Former    Types: Cigarettes   Smokeless tobacco: Never  Substance Use Topics   Alcohol use: No   Drug use: No    Home Medications Prior to Admission medications   Medication Sig Start Date End Date Taking? Authorizing Provider  benzonatate (TESSALON) 100 MG capsule Take 1 capsule (100 mg total) by mouth every 8 (eight) hours for 5 days. 12/01/21 12/06/21 Yes Kayshawn Ozburn S, PA-C  molnupiravir EUA (LAGEVRIO) 200 mg CAPS capsule Take 4 capsules (800 mg total) by mouth 2 (two) times daily for 5 days. 12/01/21 12/06/21 Yes Viktorya Arguijo S, PA-C  albuterol (VENTOLIN HFA) 108 (90 Base) MCG/ACT inhaler Inhale 1 puff into the lungs 2 (two) times daily as needed for wheezing or shortness of breath.  11/10/14   [provider]  ALPRAZolam [name]) 0.5 MG tablet Take 0.5 mg by mouth 3 (three) times daily. 01/23/21   [provider]  ARIPiprazole (ABILIFY) 2 MG tablet Take 2 mg by mouth daily.  [provider]  aspirin EC 81 MG tablet Take 81 mg by mouth daily. Swallow whole.    [provider]  diclofenac Sodium (VOLTAREN) 1 % GEL Apply 2 g topically 4 (four) times daily. Patient taking differently: Apply 2 g topically 2 (two) times daily as needed (pain). 06/28/20   Hall-Potvin, [place], PA-C  dicyclomine (BENTYL) 20 MG tablet Take 1 tablet (20 mg total) by mouth 3 (three) times daily as needed for spasms. 09/24/21   Benjiman Core, MD  EPINEPHrine 0.3 mg/0.3 mL IJ SOAJ injection Inject 0.3 mg into the muscle as needed for anaphylaxis.  11/10/14   [provider]  fluticasone-salmeterol (ADVAIR) 250-50 MCG/ACT AEPB Inhale 1 puff into the lungs in the morning and at bedtime.    [provider]  gabapentin (NEURONTIN) 300 MG capsule Take 300 mg by mouth 3 (three) times daily.    [provider]  ipratropium-albuterol (DUONEB) 0.5-2.5 (3) MG/3ML SOLN Take 3 mLs by  nebulization in the morning, at noon, and at bedtime. 07/19/19   [provider]  loperamide (IMODIUM) 2 MG capsule Take 1 capsule (2 mg total) by mouth 4 (four) times daily as needed for diarrhea or loose stools. 09/24/21   Benjiman Core, MD  Multiple Vitamin (MULTIVITAMIN WITH MINERALS) TABS tablet Take 1 tablet by mouth daily.    [provider]  oxyCODONE-acetaminophen (PERCOCET) 10-325 MG tablet Take 1 tablet by mouth 3 (three) times daily. 02/24/21   [provider]  pantoprazole (PROTONIX) 40 MG tablet Take 1 tablet (40 mg total) by mouth daily. 05/30/21 06/29/21  Lynn Ito, MD    Allergies    Contrast media [iodinated diagnostic agents], Dilaudid [hydromorphone], Tramadol, Apple, Banana, Coconut oil, Dexamethasone, Other, Solu-medrol [methylprednisolone], and Toradol [ketorolac tromethamine]  Review of Systems   Review of Systems  Constitutional:  Negative for fever.  HENT:  Negative for ear pain and sore throat.   Eyes:  Negative for visual disturbance.  Respiratory:  Positive for cough. Negative for shortness of breath.   Cardiovascular:  Positive for chest pain.  Gastrointestinal:  Negative for abdominal pain, constipation, diarrhea, nausea and vomiting.  Genitourinary:  Negative for dysuria and hematuria.  Musculoskeletal:  Negative for myalgias.  Skin:  Negative for rash.  Neurological:  Negative for headaches.  All other systems reviewed and are negative.  Physical Exam Updated Vital Signs BP 129/82 (BP Location: Right Arm)    Pulse (!) 106    Temp 98.6 F (37 C) (Oral)    Resp 20    Ht 5\' 3"  (1.6 m)    Wt 135.6 kg    SpO2 97%    BMI 52.96 kg/m   Physical Exam Vitals and nursing note reviewed.  Constitutional:      General: She is not in acute distress.    Appearance: She is well-developed.  HENT:     Head: Normocephalic and atraumatic.  Eyes:     Conjunctiva/sclera: Conjunctivae normal.  Cardiovascular:     Rate and Rhythm: Regular  rhythm. Tachycardia present.     Heart sounds: Normal heart sounds. No murmur heard. Pulmonary:     Effort: Pulmonary effort is normal. No respiratory distress.     Breath sounds: Normal breath sounds. No wheezing, rhonchi or rales.  Abdominal:     General: Bowel sounds are normal.     Palpations: Abdomen is soft.     Tenderness: There is no abdominal tenderness.  Musculoskeletal:        General: No  swelling.     Cervical back: Neck supple.  Skin:    General: Skin is warm and dry.     Capillary Refill: Capillary refill takes less than 2 seconds.  Neurological:     Mental Status: She is alert.  Psychiatric:        Mood and Affect: Mood normal.    ED Results / Procedures / Treatments   Labs (all labs ordered are listed, but only abnormal results are displayed) Labs Reviewed  RESP PANEL BY RT-PCR (FLU A&B, COVID) ARPGX2 - Abnormal; Notable for the following components:      Result Value   SARS Coronavirus 2 by RT PCR POSITIVE (*)    All other components within normal limits  CBC WITH DIFFERENTIAL/PLATELET - Abnormal; Notable for the following components:   Hemoglobin 11.0 (*)    HCT 35.9 (*)    All other components within normal limits  BASIC METABOLIC PANEL - Abnormal; Notable for the following components:   Glucose, Bld 125 (*)    All other components within normal limits  D-DIMER, QUANTITATIVE    EKG None  Radiology DG Chest Portable 1 View  Result Date: 12/01/2021 CLINICAL DATA:  Shortness of breath, cough, and chest pain. EXAM: PORTABLE CHEST 1 VIEW COMPARISON:  10/15/2021 FINDINGS: The cardiomediastinal silhouette is within normal limits. The lungs are hypoinflated. No airspace consolidation, edema, pleural effusion, or pneumothorax is identified. No acute osseous abnormality is seen. IMPRESSION: No active disease. Electronically Signed   By: Logan Bores M.D.   On: 12/01/2021 16:37    Procedures Procedures   Medications Ordered in ED Medications   oxyCODONE-acetaminophen (PERCOCET/ROXICET) 5-325 MG per tablet 1 tablet (1 tablet Oral Given 12/01/21 2027)    ED Course  I have reviewed the triage vital signs and the nursing notes.  Pertinent labs & imaging results that were available during my care of the patient were reviewed by me and considered in my medical decision making (see chart for details).    MDM Rules/Calculators/A&P                          30 y/o female presents for evaluation of cough, chest pain.  Also with some pleuritic pain.  History of VTE.  COVID-positive here in the ED patient is tachycardic without a fever.  We will add labs and a D-dimer.  Chest x-ray is negative.  CBC unremarkable BMP unremarkable Ddimer neg Covid positive  Cxr reviewed/interpreted - No active disease  Doubt pe, suspect sxs due to covid. Rx for molnupirovir given. Pt reports she is not pregnant. Tessalon given. Advised on plan for f/u and strict return precautions. All questions answered, pt stable for discharge   Final Clinical Impression(s) / ED Diagnoses Final diagnoses:  COVID    Rx / DC Orders ED Discharge Orders          Ordered    benzonatate (TESSALON) 100 MG capsule  Every 8 hours        12/01/21 2122    molnupiravir EUA (LAGEVRIO) 200 mg CAPS capsule  2 times daily        12/01/21 2122             Rodney Booze, PA-C 12/01/21 2123    Jeanell Sparrow, DO 12/03/21 [phone]

## 2021-12-01 NOTE — Discharge Instructions (Signed)
Take molnupirovir as directed. Use protection with intercourse while taking this medication   Take tessalon as directed for cough   Please follow up with your primary care provider within 5-7 days for re-evaluation of your symptoms. If you do not have a primary care provider, information for a healthcare clinic has been provided for you to make arrangements for follow up care. Please return to the emergency department for any new or worsening symptoms.

## 2021-12-16 ENCOUNTER — Inpatient Hospital Stay (HOSPITAL_COMMUNITY): Payer: Medicaid Other

## 2021-12-16 ENCOUNTER — Encounter (HOSPITAL_COMMUNITY): Payer: Self-pay | Admitting: Emergency Medicine

## 2021-12-16 ENCOUNTER — Inpatient Hospital Stay (HOSPITAL_COMMUNITY)
Admission: EM | Admit: 2021-12-16 | Discharge: 2021-12-21 | DRG: 202 | Discharge status: Against Medical Advice | Payer: Medicaid Other | Attending: Internal Medicine | Admitting: Internal Medicine

## 2021-12-16 ENCOUNTER — Other Ambulatory Visit: Payer: Self-pay

## 2021-12-16 ENCOUNTER — Emergency Department (HOSPITAL_COMMUNITY): Payer: Medicaid Other

## 2021-12-16 DIAGNOSIS — Z91018 Allergy to other foods: Secondary | ICD-10-CM | POA: Diagnosis not present

## 2021-12-16 DIAGNOSIS — K219 Gastro-esophageal reflux disease without esophagitis: Secondary | ICD-10-CM | POA: Diagnosis present

## 2021-12-16 DIAGNOSIS — Z833 Family history of diabetes mellitus: Secondary | ICD-10-CM | POA: Diagnosis not present

## 2021-12-16 DIAGNOSIS — G4733 Obstructive sleep apnea (adult) (pediatric): Secondary | ICD-10-CM | POA: Diagnosis not present

## 2021-12-16 DIAGNOSIS — F319 Bipolar disorder, unspecified: Secondary | ICD-10-CM | POA: Diagnosis present

## 2021-12-16 DIAGNOSIS — Z885 Allergy status to narcotic agent status: Secondary | ICD-10-CM | POA: Diagnosis not present

## 2021-12-16 DIAGNOSIS — Z91199 Patient's noncompliance with other medical treatment and regimen due to unspecified reason: Secondary | ICD-10-CM

## 2021-12-16 DIAGNOSIS — Z888 Allergy status to other drugs, medicaments and biological substances status: Secondary | ICD-10-CM

## 2021-12-16 DIAGNOSIS — E662 Morbid (severe) obesity with alveolar hypoventilation: Secondary | ICD-10-CM | POA: Diagnosis present

## 2021-12-16 DIAGNOSIS — I10 Essential (primary) hypertension: Secondary | ICD-10-CM | POA: Diagnosis present

## 2021-12-16 DIAGNOSIS — Z825 Family history of asthma and other chronic lower respiratory diseases: Secondary | ICD-10-CM | POA: Diagnosis not present

## 2021-12-16 DIAGNOSIS — Z5329 Procedure and treatment not carried out because of patient's decision for other reasons: Secondary | ICD-10-CM | POA: Diagnosis not present

## 2021-12-16 DIAGNOSIS — Z7982 Long term (current) use of aspirin: Secondary | ICD-10-CM

## 2021-12-16 DIAGNOSIS — Z79899 Other long term (current) drug therapy: Secondary | ICD-10-CM | POA: Diagnosis not present

## 2021-12-16 DIAGNOSIS — E119 Type 2 diabetes mellitus without complications: Secondary | ICD-10-CM

## 2021-12-16 DIAGNOSIS — Z20822 Contact with and (suspected) exposure to covid-19: Secondary | ICD-10-CM | POA: Diagnosis present

## 2021-12-16 DIAGNOSIS — R42 Dizziness and giddiness: Secondary | ICD-10-CM | POA: Diagnosis present

## 2021-12-16 DIAGNOSIS — E876 Hypokalemia: Secondary | ICD-10-CM | POA: Diagnosis present

## 2021-12-16 DIAGNOSIS — G9341 Metabolic encephalopathy: Secondary | ICD-10-CM | POA: Diagnosis not present

## 2021-12-16 DIAGNOSIS — J4551 Severe persistent asthma with (acute) exacerbation: Secondary | ICD-10-CM | POA: Diagnosis present

## 2021-12-16 DIAGNOSIS — R0902 Hypoxemia: Secondary | ICD-10-CM | POA: Diagnosis present

## 2021-12-16 DIAGNOSIS — I5031 Acute diastolic (congestive) heart failure: Secondary | ICD-10-CM

## 2021-12-16 DIAGNOSIS — Z87891 Personal history of nicotine dependence: Secondary | ICD-10-CM

## 2021-12-16 DIAGNOSIS — J4552 Severe persistent asthma with status asthmaticus: Principal | ICD-10-CM | POA: Diagnosis present

## 2021-12-16 DIAGNOSIS — Z6841 Body Mass Index (BMI) 40.0 and over, adult: Secondary | ICD-10-CM

## 2021-12-16 DIAGNOSIS — Z86711 Personal history of pulmonary embolism: Secondary | ICD-10-CM | POA: Diagnosis not present

## 2021-12-16 DIAGNOSIS — Z86718 Personal history of other venous thrombosis and embolism: Secondary | ICD-10-CM

## 2021-12-16 DIAGNOSIS — H532 Diplopia: Secondary | ICD-10-CM | POA: Diagnosis present

## 2021-12-16 DIAGNOSIS — Z91041 Radiographic dye allergy status: Secondary | ICD-10-CM | POA: Diagnosis not present

## 2021-12-16 LAB — RESPIRATORY PANEL BY PCR

## 2021-12-16 LAB — CBG MONITORING, ED
Glucose-Capillary: 229 mg/dL — ABNORMAL HIGH (ref 70–99)
Glucose-Capillary: 219 mg/dL — ABNORMAL HIGH (ref 70–99)
Glucose-Capillary: 209 mg/dL — ABNORMAL HIGH (ref 70–99)
Glucose-Capillary: 136 mg/dL — ABNORMAL HIGH (ref 70–99)

## 2021-12-16 LAB — CBC WITH DIFFERENTIAL/PLATELET
Abs Immature Granulocytes: 0.06 10*3/uL (ref 0.00–0.07)
Basophils Absolute: 0 10*3/uL (ref 0.0–0.1)
Basophils Relative: 0 %
Eosinophils Absolute: 0 10*3/uL (ref 0.0–0.5)
Eosinophils Relative: 0 %
HCT: 35.8 % — ABNORMAL LOW (ref 36.0–46.0)
Hemoglobin: 10.8 g/dL — ABNORMAL LOW (ref 12.0–15.0)
Immature Granulocytes: 0 %
Lymphocytes Relative: 25 %
Lymphs Abs: 4.2 10*3/uL — ABNORMAL HIGH (ref 0.7–4.0)
MCH: 26.2 pg (ref 26.0–34.0)
MCHC: 30.2 g/dL (ref 30.0–36.0)
MCV: 86.9 fL (ref 80.0–100.0)
Monocytes Absolute: 0.8 10*3/uL (ref 0.1–1.0)
Monocytes Relative: 5 %
Neutro Abs: 11.4 10*3/uL — ABNORMAL HIGH (ref 1.7–7.7)
Neutrophils Relative %: 70 %
Platelets: 416 10*3/uL — ABNORMAL HIGH (ref 150–400)
RBC: 4.12 MIL/uL (ref 3.87–5.11)
RDW: 15.8 % — ABNORMAL HIGH (ref 11.5–15.5)
WBC: 16.5 10*3/uL — ABNORMAL HIGH (ref 4.0–10.5)
nRBC: 0 % (ref 0.0–0.2)

## 2021-12-16 LAB — BLOOD GAS, ARTERIAL
Acid-base deficit: 0.9 mmol/L (ref 0.0–2.0)
Bicarbonate: 24.6 mmol/L (ref 20.0–28.0)
Drawn by: 25770
O2 Saturation: 95.6 %
Patient temperature: 98.6
pCO2 arterial: 47.2 mmHg (ref 32.0–48.0)
pH, Arterial: 7.337 — ABNORMAL LOW (ref 7.350–7.450)
pO2, Arterial: 83.7 mmHg (ref 83.0–108.0)

## 2021-12-16 LAB — ECHOCARDIOGRAM COMPLETE
Area-P 1/2: 6.65 cm2
Height: 63 in
S' Lateral: 2.9 cm
Weight: 4768 oz

## 2021-12-16 LAB — RESP PANEL BY RT-PCR (FLU A&B, COVID) ARPGX2
Influenza A by PCR: NEGATIVE
Influenza B by PCR: NEGATIVE
SARS Coronavirus 2 by RT PCR: NEGATIVE

## 2021-12-16 LAB — C-REACTIVE PROTEIN: CRP: 1.3 mg/dL — ABNORMAL HIGH (ref <1.0)

## 2021-12-16 LAB — BASIC METABOLIC PANEL
Anion gap: 8 (ref 5–15)
BUN: 10 mg/dL (ref 6–20)
CO2: 25 mmol/L (ref 22–32)
Calcium: 8.6 mg/dL — ABNORMAL LOW (ref 8.9–10.3)
Chloride: 107 mmol/L (ref 98–111)
Creatinine, Ser: 0.75 mg/dL (ref 0.44–1.00)
GFR, Estimated: >60 mL/min (ref >60)
Glucose, Bld: 134 mg/dL — ABNORMAL HIGH (ref 70–99)
Potassium: 3.4 mmol/L — ABNORMAL LOW (ref 3.5–5.1)
Sodium: 140 mmol/L (ref 135–145)

## 2021-12-16 LAB — I-STAT BETA HCG BLOOD, ED (MC, WL, AP ONLY): I-stat hCG, quantitative: <5 m[IU]/mL (ref <5)

## 2021-12-16 LAB — LACTIC ACID, PLASMA: Lactic Acid, Venous: 1.5 mmol/L (ref 0.5–1.9)

## 2021-12-16 LAB — D-DIMER, QUANTITATIVE: D-Dimer, Quant: <0.27 ug/mL-FEU (ref 0.00–0.50)

## 2021-12-16 LAB — BRAIN NATRIURETIC PEPTIDE: B Natriuretic Peptide: 16.1 pg/mL (ref 0.0–100.0)

## 2021-12-16 LAB — TROPONIN I (HIGH SENSITIVITY): Troponin I (High Sensitivity): 3 ng/L (ref <18)

## 2021-12-16 LAB — PROCALCITONIN: Procalcitonin: <0.1 ng/mL

## 2021-12-16 IMAGING — DX DG CHEST 1V PORT
1 series · 1 of 1 positions shown · non-contrast
Comparison: [DATE]

CLINICAL DATA: Chest pain, cough

EXAM:
PORTABLE CHEST 1 VIEW

[chest ap]
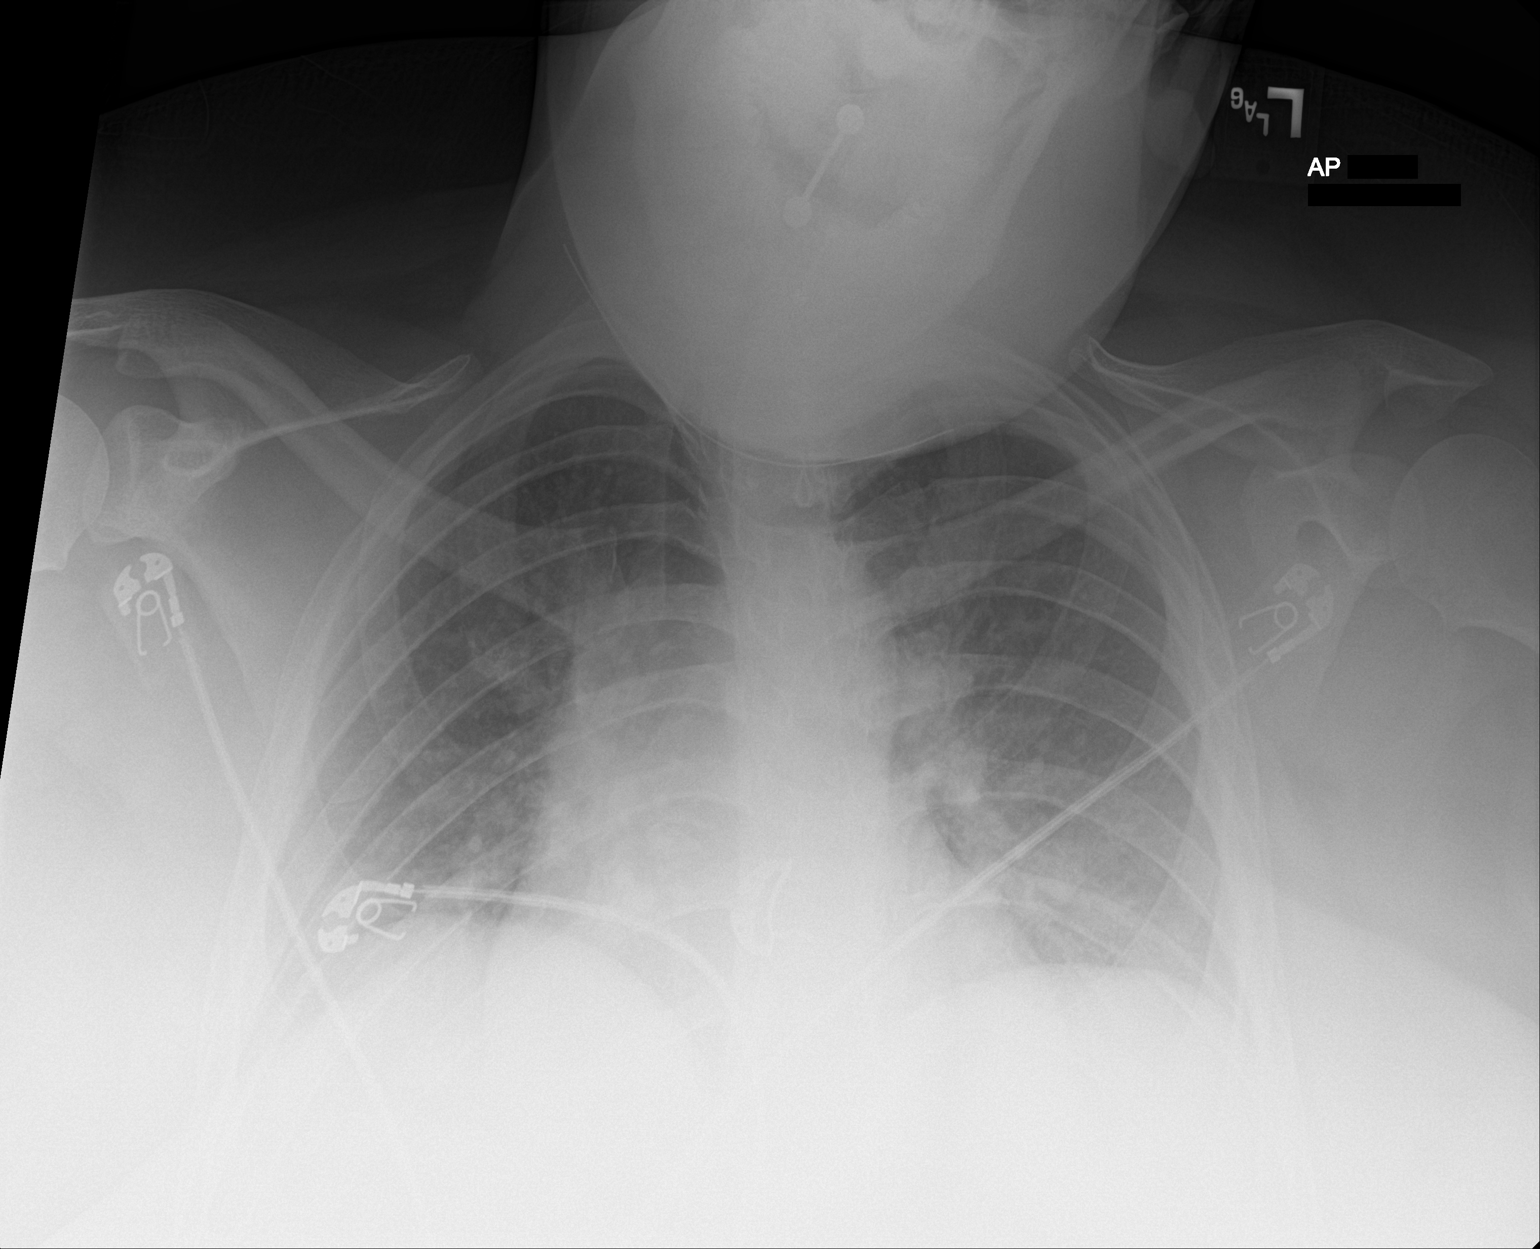

[1 of 1 positions shown; findings below may reference images not displayed]

FINDINGS: Lungs volumes are small, but are symmetric and are clear. No
pneumothorax or pleural effusion. Cardiac size within normal limits.
Vascular crowding at the hila. Osseous structures are
age-appropriate. No acute bone abnormality.
IMPRESSION: Pulmonary hypoinflation

## 2021-12-16 MED ORDER — ENOXAPARIN SODIUM 60 MG/0.6ML IJ SOSY
60.0000 mg | PREFILLED_SYRINGE | INTRAMUSCULAR | Status: DC
  Administered 2021-12-16 – 2021-12-18 (×3): 60 mg via SUBCUTANEOUS
  Filled 2021-12-16 (×7): qty 0.6

## 2021-12-16 MED ORDER — ACETAMINOPHEN 325 MG PO TABS
650.0000 mg | ORAL_TABLET | Freq: Four times a day (QID) | ORAL | Status: DC | PRN
  Administered 2021-12-19: 650 mg via ORAL
  Filled 2021-12-16: qty 2

## 2021-12-16 MED ORDER — POTASSIUM CHLORIDE CRYS ER 20 MEQ PO TBCR
40.0000 meq | EXTENDED_RELEASE_TABLET | Freq: Once | ORAL | Status: AC
  Administered 2021-12-16: 40 meq via ORAL
  Filled 2021-12-16: qty 2

## 2021-12-16 MED ORDER — ACETAMINOPHEN 650 MG RE SUPP: 650.0000 mg | Freq: Four times a day (QID) | RECTAL | Status: DC | PRN

## 2021-12-16 MED ORDER — GABAPENTIN 300 MG PO CAPS
300.0000 mg | ORAL_CAPSULE | Freq: Three times a day (TID) | ORAL | Status: DC
  Administered 2021-12-16 – 2021-12-21 (×16): 300 mg via ORAL
  Filled 2021-12-16 (×16): qty 1

## 2021-12-16 MED ORDER — POLYETHYLENE GLYCOL 3350 17 G PO PACK: 17.0000 g | PACK | Freq: Every day | ORAL | Status: DC | PRN

## 2021-12-16 MED ORDER — INSULIN ASPART 100 UNIT/ML IJ SOLN
0.0000 [IU] | Freq: Three times a day (TID) | INTRAMUSCULAR | Status: DC
  Administered 2021-12-16: 5 [IU] via SUBCUTANEOUS
  Administered 2021-12-16: 2 [IU] via SUBCUTANEOUS
  Administered 2021-12-16 (×2): 5 [IU] via SUBCUTANEOUS
  Administered 2021-12-17: 2 [IU] via SUBCUTANEOUS
  Administered 2021-12-17: 8 [IU] via SUBCUTANEOUS
  Administered 2021-12-17: 5 [IU] via SUBCUTANEOUS
  Administered 2021-12-17: 2 [IU] via SUBCUTANEOUS
  Administered 2021-12-18: 15 [IU] via SUBCUTANEOUS
  Administered 2021-12-18: 11 [IU] via SUBCUTANEOUS
  Administered 2021-12-19: 8 [IU] via SUBCUTANEOUS
  Administered 2021-12-19: 3 [IU] via SUBCUTANEOUS
  Administered 2021-12-19: 8 [IU] via SUBCUTANEOUS
  Administered 2021-12-19: 5 [IU] via SUBCUTANEOUS
  Administered 2021-12-20 (×2): 3 [IU] via SUBCUTANEOUS
  Administered 2021-12-20: 2 [IU] via SUBCUTANEOUS
  Filled 2021-12-16: qty 0.15

## 2021-12-16 MED ORDER — ALBUTEROL SULFATE (2.5 MG/3ML) 0.083% IN NEBU
10.0000 mg/h | INHALATION_SOLUTION | Freq: Once | RESPIRATORY_TRACT | Status: DC
  Filled 2021-12-16: qty 20

## 2021-12-16 MED ORDER — GUAIFENESIN-CODEINE 100-10 MG/5ML PO SOLN
10.0000 mL | Freq: Four times a day (QID) | ORAL | Status: DC | PRN
  Administered 2021-12-18 – 2021-12-19 (×4): 10 mL via ORAL
  Filled 2021-12-16 (×5): qty 10

## 2021-12-16 MED ORDER — ALBUTEROL SULFATE (2.5 MG/3ML) 0.083% IN NEBU
10.0000 mg | INHALATION_SOLUTION | Freq: Once | RESPIRATORY_TRACT | Status: AC
  Administered 2021-12-16: 10 mg via RESPIRATORY_TRACT
  Filled 2021-12-16: qty 12

## 2021-12-16 MED ORDER — ARIPIPRAZOLE 2 MG PO TABS
2.0000 mg | ORAL_TABLET | Freq: Every day | ORAL | Status: DC
  Filled 2021-12-16: qty 1

## 2021-12-16 MED ORDER — IPRATROPIUM-ALBUTEROL 0.5-2.5 (3) MG/3ML IN SOLN
3.0000 mL | Freq: Three times a day (TID) | RESPIRATORY_TRACT | Status: DC
  Administered 2021-12-17 – 2021-12-19 (×7): 3 mL via RESPIRATORY_TRACT
  Filled 2021-12-16 (×7): qty 3

## 2021-12-16 MED ORDER — ONDANSETRON HCL 4 MG/2ML IJ SOLN
4.0000 mg | Freq: Four times a day (QID) | INTRAMUSCULAR | Status: DC | PRN
  Administered 2021-12-17 – 2021-12-18 (×3): 4 mg via INTRAVENOUS
  Filled 2021-12-16 (×3): qty 2

## 2021-12-16 MED ORDER — OXYCODONE HCL 5 MG PO TABS
5.0000 mg | ORAL_TABLET | Freq: Three times a day (TID) | ORAL | Status: DC | PRN
  Administered 2021-12-16 – 2021-12-20 (×7): 5 mg via ORAL
  Filled 2021-12-16 (×7): qty 1

## 2021-12-16 MED ORDER — ALBUTEROL SULFATE (2.5 MG/3ML) 0.083% IN NEBU
INHALATION_SOLUTION | RESPIRATORY_TRACT | Status: AC
  Filled 2021-12-16: qty 12

## 2021-12-16 MED ORDER — ASPIRIN EC 81 MG PO TBEC
81.0000 mg | DELAYED_RELEASE_TABLET | Freq: Every day | ORAL | Status: DC
  Administered 2021-12-16 – 2021-12-21 (×6): 81 mg via ORAL
  Filled 2021-12-16 (×6): qty 1

## 2021-12-16 MED ORDER — IPRATROPIUM-ALBUTEROL 0.5-2.5 (3) MG/3ML IN SOLN
3.0000 mL | Freq: Four times a day (QID) | RESPIRATORY_TRACT | Status: DC
  Administered 2021-12-16 (×3): 3 mL via RESPIRATORY_TRACT
  Filled 2021-12-16 (×3): qty 3

## 2021-12-16 MED ORDER — ALPRAZOLAM 0.5 MG PO TABS
0.5000 mg | ORAL_TABLET | Freq: Three times a day (TID) | ORAL | Status: DC
  Administered 2021-12-16 – 2021-12-17 (×3): 0.5 mg via ORAL
  Filled 2021-12-16 (×3): qty 1

## 2021-12-16 MED ORDER — POTASSIUM CHLORIDE CRYS ER 20 MEQ PO TBCR: 40.0000 meq | EXTENDED_RELEASE_TABLET | Freq: Once | ORAL | Status: DC

## 2021-12-16 MED ORDER — PREDNISONE 50 MG PO TABS
50.0000 mg | ORAL_TABLET | Freq: Every day | ORAL | Status: DC
  Administered 2021-12-16 – 2021-12-17 (×2): 50 mg via ORAL
  Filled 2021-12-16 (×2): qty 1

## 2021-12-16 MED ORDER — OXYCODONE-ACETAMINOPHEN 5-325 MG PO TABS
1.0000 | ORAL_TABLET | Freq: Three times a day (TID) | ORAL | Status: DC | PRN
  Administered 2021-12-18 – 2021-12-19 (×3): 1 via ORAL
  Filled 2021-12-16 (×3): qty 1

## 2021-12-16 MED ORDER — LURASIDONE HCL 20 MG PO TABS
20.0000 mg | ORAL_TABLET | Freq: Every day | ORAL | Status: DC
  Administered 2021-12-16 – 2021-12-21 (×6): 20 mg via ORAL
  Filled 2021-12-16 (×6): qty 1

## 2021-12-16 MED ORDER — OXYCODONE-ACETAMINOPHEN 10-325 MG PO TABS: 1.0000 | ORAL_TABLET | Freq: Three times a day (TID) | ORAL | Status: DC | PRN

## 2021-12-16 MED ORDER — ONDANSETRON HCL 4 MG PO TABS: 4.0000 mg | ORAL_TABLET | Freq: Four times a day (QID) | ORAL | Status: DC | PRN

## 2021-12-16 MED ORDER — POTASSIUM CHLORIDE CRYS ER 20 MEQ PO TBCR
20.0000 meq | EXTENDED_RELEASE_TABLET | Freq: Once | ORAL | Status: AC
  Administered 2021-12-16: 20 meq via ORAL
  Filled 2021-12-16: qty 1

## 2021-12-16 MED ORDER — DICYCLOMINE HCL 20 MG PO TABS: 20.0000 mg | ORAL_TABLET | Freq: Three times a day (TID) | ORAL | Status: DC | PRN

## 2021-12-16 MED ORDER — PANTOPRAZOLE SODIUM 40 MG PO TBEC: 40.0000 mg | DELAYED_RELEASE_TABLET | Freq: Every day | ORAL | Status: DC

## 2021-12-16 MED ORDER — IPRATROPIUM-ALBUTEROL 0.5-2.5 (3) MG/3ML IN SOLN
3.0000 mL | RESPIRATORY_TRACT | Status: DC | PRN
  Administered 2021-12-17 – 2021-12-19 (×2): 3 mL via RESPIRATORY_TRACT
  Filled 2021-12-16 (×2): qty 3

## 2021-12-16 NOTE — Assessment & Plan Note (Signed)
·   Continuing home regimen of psychotropic medications

## 2021-12-16 NOTE — ED Notes (Signed)
Pt's oxygen level dropped from 96 to 86 when she fell asleep. Pt placed on 2L of oxygen by nasal cannula and her oxygen returned to 92

## 2021-12-16 NOTE — Progress Notes (Incomplete)
°  Echocardiogram 2D Echocardiogram has been performed.  Felicia Bradley M 12/16/2021, 2:36 PM

## 2021-12-16 NOTE — Assessment & Plan Note (Signed)
·   Replacing with potassium chloride °· Evaluating for concurrent hypomagnesemia  °· Monitoring potassium levels with serial chemistries. ° °

## 2021-12-16 NOTE — Assessment & Plan Note (Signed)
Continuing home regimen of daily PPI therapy.  

## 2021-12-16 NOTE — Assessment & Plan Note (Signed)
·   We will resume CPAP nightly if patient is compliant with this at home

## 2021-12-16 NOTE — ED Notes (Signed)
Pt called out stating that she needed her nurse. Upon entering room pt states " I am having trouble with my vision and I called for my nurse but nobody came." Pt endorses double vision. This writer attempted to look at pt's pupils and pt would not allow same to be performed. This Probation officer made ED provider aware as well as the hospitalist.

## 2021-12-16 NOTE — ED Provider Notes (Signed)
[facility]-EMERGENCY DEPT Provider Note   CSN: [id number] Arrival date & time: 12/16/21  0156     History  Chief Complaint  Patient presents with   Shortness of Breath    Felicia Bradley is a 31 y.o. female with PMHx HTN, Asthma, Diabetes, OSA, and hx DVT/PE (not currently anticoagulated) who presents to the ED today via EMS for SOB x 2 weeks.  Per chart review patient was seen in the ED on 12/19 and tested positive for COVID-19.  She was discharged home with manipulative year.  She states that about 2 weeks ago she began having persistent symptoms however for the past 2 days she has gotten worsening shortness of breath.  She also reports that she had a fever of 100.3 about 2 hours ago, last took Tylenol 2 hours ago.  She complains of cough, shortness of breath, wheezing, body aches, chest pain.  She was provided with IV mag, Solu-Medrol, DuoNeb as well as Zofran in route with EMS without much improvement.  Patient does mention that her boyfriend recently tested positive for COVID about 4 days ago.   The history is provided by the patient and medical records.      Home Medications Prior to Admission medications   Medication Sig Start Date End Date Taking? Authorizing Provider  albuterol (VENTOLIN HFA) 108 (90 Base) MCG/ACT inhaler Inhale 1 puff into the lungs 2 (two) times daily as needed for wheezing or shortness of breath.  11/10/14   [provider]  ALPRAZolam Prudy Feeler) 0.5 MG tablet Take 0.5 mg by mouth 3 (three) times daily. 01/23/21   [provider]  ARIPiprazole (ABILIFY) 2 MG tablet Take 2 mg by mouth daily.    [provider]  aspirin EC 81 MG tablet Take 81 mg by mouth daily. Swallow whole.    [provider]  diclofenac Sodium (VOLTAREN) 1 % GEL Apply 2 g topically 4 (four) times daily. Patient taking differently: Apply 2 g topically 2 (two) times daily as needed (pain). 06/28/20   Hall-Potvin, [place], PA-C  dicyclomine  (BENTYL) 20 MG tablet Take 1 tablet (20 mg total) by mouth 3 (three) times daily as needed for spasms. 09/24/21   Benjiman Core, MD  EPINEPHrine 0.3 mg/0.3 mL IJ SOAJ injection Inject 0.3 mg into the muscle as needed for anaphylaxis.  11/10/14   [provider]  fluticasone-salmeterol (ADVAIR) 250-50 MCG/ACT AEPB Inhale 1 puff into the lungs in the morning and at bedtime.    [provider]  gabapentin (NEURONTIN) 300 MG capsule Take 300 mg by mouth 3 (three) times daily.    [provider]  ipratropium-albuterol (DUONEB) 0.5-2.5 (3) MG/3ML SOLN Take 3 mLs by nebulization in the morning, at noon, and at bedtime. 07/19/19   [provider]  loperamide (IMODIUM) 2 MG capsule Take 1 capsule (2 mg total) by mouth 4 (four) times daily as needed for diarrhea or loose stools. 09/24/21   Benjiman Core, MD  Multiple Vitamin (MULTIVITAMIN WITH MINERALS) TABS tablet Take 1 tablet by mouth daily.    [provider]  oxyCODONE-acetaminophen (PERCOCET) 10-325 MG tablet Take 1 tablet by mouth 3 (three) times daily. 02/24/21   [provider]  pantoprazole (PROTONIX) 40 MG tablet Take 1 tablet (40 mg total) by mouth daily. 05/30/21 06/29/21  Lynn Ito, MD      Allergies    Contrast media [iodinated contrast media], Dilaudid [hydromorphone], Tramadol, Apple, Banana, Coconut oil, Dexamethasone, Other, Solu-medrol [methylprednisolone], and Toradol [employer]  tromethamine]    Review of Systems   Review of Systems  Constitutional:  Positive for chills, fatigue and fever.  Respiratory:  Positive for cough, shortness of breath and wheezing.   Cardiovascular:  Positive for chest pain.  Musculoskeletal:  Positive for myalgias.  Neurological:  Positive for weakness (generalized).  All other systems reviewed and are negative.  Physical Exam Updated Vital Signs BP 117/66 (BP Location: Right Arm)    Pulse (!) 131    Temp 99.3 F (37.4 C) (Oral)    Resp (!) 24     Ht 5\' 3"  (1.6 m)    Wt 135.2 kg    SpO2 95%    BMI 52.79 kg/m   Physical Exam Vitals and nursing note reviewed.  Constitutional:      Appearance: She is not ill-appearing.  HENT:     Head: Normocephalic and atraumatic.  Eyes:     Conjunctiva/sclera: Conjunctivae normal.  Cardiovascular:     Rate and Rhythm: Regular rhythm. Tachycardia present.     Pulses: Normal pulses.     Comments: HR 131 Pulmonary:     Effort: Tachypnea present.     Breath sounds: Decreased breath sounds and wheezing present.     Comments: Tachypneic. Speaking in shorter sentences. Satting 95% on RA. Diffuse end expiratory wheezing with diminished breath sounds throughout.  Abdominal:     Palpations: Abdomen is soft.     Tenderness: There is no abdominal tenderness. There is no guarding or rebound.  Musculoskeletal:     Cervical back: Neck supple.  Skin:    General: Skin is warm and dry.  Neurological:     Mental Status: She is alert.    ED Results / Procedures / Treatments   Labs (all labs ordered are listed, but only abnormal results are displayed) Labs Reviewed  BASIC METABOLIC PANEL - Abnormal; Notable for the following components:      Result Value   Potassium 3.4 (*)    Glucose, Bld 134 (*)    Calcium 8.6 (*)    All other components within normal limits  CBC WITH DIFFERENTIAL/PLATELET - Abnormal; Notable for the following components:   WBC 16.5 (*)    Hemoglobin 10.8 (*)    HCT 35.8 (*)    RDW 15.8 (*)    Platelets 416 (*)    Neutro Abs 11.4 (*)    Lymphs Abs 4.2 (*)    All other components within normal limits  RESP PANEL BY RT-PCR (FLU A&B, COVID) ARPGX2  CULTURE, BLOOD (ROUTINE X 2)  CULTURE, BLOOD (ROUTINE X 2)  D-DIMER, QUANTITATIVE  LACTIC ACID, PLASMA  I-STAT BETA HCG BLOOD, ED ([facility], [facility], [facility] ONLY)  TROPONIN I (HIGH SENSITIVITY)    EKG EKG Interpretation  Date/Time:  Tuesday December 16 2021 02:16:19 EST Ventricular Rate:  135 PR Interval:  139 QRS Duration: 83 QT  Interval:  296 QTC Calculation: 444 R Axis:   87 Text Interpretation: Sinus tachycardia Probable inferior infarct, old Confirmed by [id number] (Geoffery Lyons) on 12/16/2021 2:23:43 AM  Radiology DG Chest Port 1 View  Result Date: 12/16/2021 CLINICAL DATA:  Chest pain, cough EXAM: PORTABLE CHEST 1 VIEW COMPARISON:  12/01/2021 FINDINGS: Lungs volumes are small, but are symmetric and are clear. No pneumothorax or pleural effusion. Cardiac size within normal limits. Vascular crowding at the hila. Osseous structures are age-appropriate. No acute bone abnormality. IMPRESSION: Pulmonary hypoinflation Electronically Signed   By: 12/03/2021 M.D.   On: 12/16/2021 02:31    Procedures  Procedures    Medications Ordered in ED Medications  albuterol (PROVENTIL) (2.5 MG/3ML) 0.083% nebulizer solution (  Not Given 12/16/21 0231)  albuterol (PROVENTIL) (2.5 MG/3ML) 0.083% nebulizer solution 10 mg (10 mg Nebulization Given 12/16/21 0233)    ED Course/ Medical Decision Making/ A&P                           Medical Decision Making 31 year old female who presents to the ED today with complaint of shortness of breath x2 weeks, worsening over the past 2 days with complaint of fevers, chills, body aches, chest pain.  On arrival to the ED today patient's temperature is 99.3.  She reports last temp 100.3 about 2 hours ago prior to taking Tylenol.  Heart rate 131 and respirations of 24.  Satting 95% on room air.  Provided with mag, Solu-Medrol, DuoNeb, Zofran in route with EMS without much improvement.  History of asthma and sleep apnea.  On exam she is speaking in short sentences.  She has diminished breath sounds throughout with end expiratory wheezes.  She is tachypneic.  For asthma exacerbation at this time.  Will provide continuous neb today.  Per chart review she did test positive for COVID-19 on 12/19 however states that her boyfriend tested positive about 4 days ago.  I have very low suspicion that she could have a  recurrence of COVID in such a short period of time.  She does have a history of DVT/PE however is not currently anticoagulated.  She did have a D-dimer done during last ED visit which was negative.  She unfortunately does have a contrast allergy.  Will work-up for shortness of breath at this time with EKG, chest x-ray, labs including troponin and D-dimer.  Will review swab for COVID and flu as I do suspect patient may require admission given her current presentation after multiple medications by EMS.   Workup overall reassuring. No findings to suggest pneumonia, COVID, flu, ACS, or PE. Suspect symptoms related to asthma exacerbation. Given no improvement after multiple medications do feel pt requires admission at this time which she is in agreement to. Will call for admission.   Problems Addressed: Severe persistent asthma with exacerbation: acute illness or injury    Details: Treated with IV mag, solumedrol, duoneb treatment en route with EMS. Additional 1 hour continuous neb provided in the ED without improvement. Pt continues to be tachypneic with accessory muscle use. Feel she requires admission at this time.  Amount and/or Complexity of Data Reviewed Labs: ordered.    Details: CBC with leukocytosis of 16,500. No left shift. Hgb 10.8 (11.0 2 weeks ago).  BMP with potassium 3.4 and glucose 134. No other electrolyte abnormalities.  COVID and flu negative Lactic acid 1.5 Troponin 3 D dimer < 0.27 Radiology: ordered and independent interpretation performed. Decision-making details documented in ED Course.    Details: CXR without evidence of infiltrates. Radiologist with impression of pulmonary  hypoinflation. ECG/medicine tests: ordered. Decision-making details documented in ED Course. Discussion of management or test interpretation with external provider(s): Discussed case with [facility] Dr. Leafy HalfShalhoub who agrees to evaluate patient for admission  Risk Decision regarding  hospitalization.          Final Clinical Impression(s) / ED Diagnoses Final diagnoses:  Severe persistent asthma with exacerbation    Rx / DC Orders ED Discharge Orders     None         Tanda RockersVenter, Lasandra Batley, PA-C 12/16/21 [id number]  Geoffery Lyonselo, [place], MD 12/16/21 [id number]

## 2021-12-16 NOTE — Progress Notes (Signed)
ABG drawn by Ok Anis. Called Lab and notified LAB ABG is being sent.

## 2021-12-16 NOTE — Assessment & Plan Note (Addendum)
·   Patient presenting with progressively worsening shortness of breath and wheezing  Patient suffers from morbid obesity and likely some degree of obesity hypoventilation syndrome compounding patient's presentation of shortness of breath   Patient was diagnosed with COVID-19 via PCR 12/19 and was treated with a course of Molnupirivir at the time  That being said, COVID-19 and influenza PCR testing now are both negative.  I do not believe patient's current symptoms are related to residual COVID-19 infection and therefore do not believe that COVID-19 isolation is necessary  Chest x-ray does not reveal evidence of a definitive new pneumonia however the x-ray is limited due to patient's body habitus  D-dimer is unremarkable  Will obtain BNP and follow this up with an echocardiogram in case patient is developing some degree of cardiogenic pulmonary edema.  If BNP is elevated will initiate intravenous diuretics   Will obtain respiratory viral panel to assess for other contributing viruses.  We will additionally obtain ABG  Obtaining CRP and procalcitonin in case there is an underlying superimposed bacterial infectious process  Providing limited amounts of submental oxygen to maintain oxygen saturations of 90 to 96%  Treating with systemic steroids and aggressive bronchodilator therapy  Ordering as needed antitussives for patient's associated substantial cough which I am certain is contributing to her discomfort

## 2021-12-16 NOTE — ED Triage Notes (Signed)
Pt arrived via EMS from home. Pt has been sick for 2 weeks. 4 days ago her boyfriend tested positive for covid. Pt has wheezing and has been coughing and reports shortness of breath. Pt was given IV magnesium, solumedrol, zofran, and a duoneb inhaler.

## 2021-12-16 NOTE — H&P (Addendum)
History and Physical    Felicia Bradley [id number] DOB: [birth year] DOA: 12/16/2021  PCP: [name]  Patient coming from: Home via EMS   Chief Complaint:  Chief Complaint  Patient presents with   Shortness of Breath     HPI:    31 year old female with past medical history of diabetes mellitus type 2, bipolar disorder, obstructive sleep apnea, severe persistent asthma, gastroesophageal reflux disease, history of DVT/PE (2016) who presents to [facility] emergency department with complaints of cough and shortness of breath.  Patient explains that approximately 3 weeks ago she developed cough malaise and weakness.  After several days of worsening patient presented to [place] emergency department for evaluation on 12/19.  During that visit patient was diagnosed with COVID-19 via PCR testing and was discharged home on a course of Molnupirivir.    After going home patient did transiently improve but then began to worsen again.  Now the past several days patient's symptoms have become quite severe.  Patient is complaining of severe shortness of breath, worse with exertion and improved with rest.  Shortness of breath associated with ongoing severe cough that is nonproductive with associated wheezing generalized myalgias and chest pain with deep inspiration or cough.  Patient symptoms became so severe that patient eventually contacted EMS who promptly came to evaluate the patient and initiated treatment with intravenous magnesium, Solu-Medrol and duo nebs in the field prior to bring her into [facility] emergency department for evaluation.  Upon evaluation in the emergency department patient's presentation was felt to be consistent with an acute asthma exacerbation.  Patient was administered continued bronchodilator therapy for nearly an hour but despite this continued to have significant work of breathing.  Repeat COVID-19 PCR testing was found to be negative.   Due to status asthmaticus hospitalist group was then called to assess the patient for admission to the hospital.  Review of Systems:   Review of Systems  Constitutional:  Positive for chills.  Respiratory:  Positive for cough, shortness of breath and wheezing.   Cardiovascular:  Positive for chest pain.  Neurological:  Positive for weakness.  All other systems reviewed and are negative.  Past Medical History:  Diagnosis Date   Asthma    Bipolar disorder ([place])    Diabetes mellitus without complication ([place])    type 2   DVT (deep venous thrombosis) ([place]) 2015   w/ recannulation per Care Everywhere   Hypertension    Obesity    Pulmonary embolism ([place]) 2015    Past Surgical History:  Procedure Laterality Date   CESAREAN SECTION       reports that she has quit smoking. Her smoking use included cigarettes. She has never used smokeless tobacco. She reports that she does not drink alcohol and does not use drugs.  Allergies  Allergen Reactions   Contrast Media [Iodinated Contrast Media] Anaphylaxis   Dilaudid [Hydromorphone] Anaphylaxis   Tramadol Itching   Apple Hives   Banana Hives   Coconut Oil Hives and Itching   Dexamethasone Nausea And Vomiting and Hives    "feels like my pubic hair is on fire"   Other Hives    Zucchini and squash   Solu-Medrol [Methylprednisolone] Nausea And Vomiting   Toradol [Ketorolac Tromethamine] Hives and Itching    Family History  Problem Relation Age of Onset   Diabetes Mother    Asthma Mother    Asthma Maternal Aunt    Diabetes Maternal Aunt  Prior to Admission medications   Medication Sig Start Date End Date Taking? Authorizing Provider  albuterol (VENTOLIN HFA) 108 (90 Base) MCG/ACT inhaler Inhale 1 puff into the lungs 2 (two) times daily as needed for wheezing or shortness of breath.  11/10/14   [provider]  ALPRAZolam [name]) 0.5 MG tablet Take 0.5 mg by mouth 3 (three) times daily. 01/23/21   [provider]  ARIPiprazole (ABILIFY) 2 MG tablet Take 2 mg by mouth daily.    [provider]  aspirin EC 81 MG tablet Take 81 mg by mouth daily. Swallow whole.    [provider]  diclofenac Sodium (VOLTAREN) 1 % GEL Apply 2 g topically 4 (four) times daily. Patient taking differently: Apply 2 g topically 2 (two) times daily as needed (pain). 06/28/20   Hall-Potvin, [place], PA-C  dicyclomine (BENTYL) 20 MG tablet Take 1 tablet (20 mg total) by mouth 3 (three) times daily as needed for spasms. 09/24/21   [name], MD  EPINEPHrine 0.3 mg/0.3 mL IJ SOAJ injection Inject 0.3 mg into the muscle as needed for anaphylaxis.  11/10/14   [provider]  fluticasone-salmeterol (ADVAIR) 250-50 MCG/ACT AEPB Inhale 1 puff into the lungs in the morning and at bedtime.    [provider]  gabapentin (NEURONTIN) 300 MG capsule Take 300 mg by mouth 3 (three) times daily.    [provider]  ipratropium-albuterol (DUONEB) 0.5-2.5 (3) MG/3ML SOLN Take 3 mLs by nebulization in the morning, at noon, and at bedtime. 07/19/19   [provider]  loperamide (IMODIUM) 2 MG capsule Take 1 capsule (2 mg total) by mouth 4 (four) times daily as needed for diarrhea or loose stools. 09/24/21   [name], MD  Multiple Vitamin (MULTIVITAMIN WITH MINERALS) TABS tablet Take 1 tablet by mouth daily.    [provider]  oxyCODONE-acetaminophen (PERCOCET) 10-325 MG tablet Take 1 tablet by mouth 3 (three) times daily. 02/24/21   [provider]  pantoprazole (PROTONIX) 40 MG tablet Take 1 tablet (40 mg total) by mouth daily. 05/30/21 06/29/21  [name], MD    Physical Exam: Vitals:   12/16/21 0330 12/16/21 0428 12/16/21 0430 12/16/21 0830  BP: (!) 114/51  111/71 124/74  Pulse: (!) 107 (!) 107 (!) 106 100  Resp: (!) 27 (!) 25 (!) 25 13  Temp:      TempSrc:      SpO2: 97% 92% 96% 96%  Weight:      Height:        Constitutional: Awake alert and  oriented x3, is in mild respiratory distress Skin: no rashes, no lesions, good skin turgor noted. Eyes: Pupils are equally reactive to light.  No evidence of scleral icterus or conjunctival pallor.  ENMT: Moist mucous membranes noted.  Posterior pharynx clear of any exudate or lesions.   Neck: normal, supple, no masses, no thyromegaly.  Unable to assess for jugular venous pulse due to body habitus. Respiratory: Coarse breath sounds in all fields with expiratory wheezing heard.  Markedly diminished breath sounds at the bases.  Increased respiratory effort without accessory muscle use. Cardiovascular: Tachycardic rate with regular rhythm no murmurs / rubs / gallops. No extremity edema. 2+ pedal pulses. No carotid bruits.  Chest:   Nontender without crepitus or deformity.   Back:   Nontender without crepitus or deformity. Abdomen: Abdomen is soft and nontender.  No evidence of intra-abdominal masses.  Positive bowel sounds noted in all quadrants.   Musculoskeletal: No joint  deformity upper and lower extremities. Good ROM, no contractures. Normal muscle tone.  Neurologic: CN 2-12 grossly intact. Sensation intact.  Patient moving all 4 extremities spontaneously.  Patient is following all commands.  Patient is responsive to verbal stimuli.   Psychiatric: Patient exhibits normal mood with appropriate affect.  Patient seems to possess insight as to their current situation.     Labs on Admission: I have personally reviewed following labs and imaging studies -   CBC: Recent Labs  Lab 12/16/21 0214  WBC 16.5*  NEUTROABS 11.4*  HGB 10.8*  HCT 35.8*  MCV 86.9  PLT 123456*   Basic Metabolic Panel: Recent Labs  Lab 12/16/21 0214  NA 140  K 3.4*  CL 107  CO2 25  GLUCOSE 134*  BUN 10  CREATININE 0.75  CALCIUM 8.6*   GFR: Estimated Creatinine Clearance: 137.5 mL/min (by C-G formula based on SCr of 0.75 mg/dL). Liver Function Tests: No results for input(s): AST, ALT, ALKPHOS, BILITOT, PROT,  ALBUMIN in the last 168 hours. No results for input(s): LIPASE, AMYLASE in the last 168 hours. No results for input(s): AMMONIA in the last 168 hours. Coagulation Profile: No results for input(s): INR, PROTIME in the last 168 hours. Cardiac Enzymes: No results for input(s): CKTOTAL, CKMB, CKMBINDEX, TROPONINI in the last 168 hours. BNP (last 3 results) No results for input(s): PROBNP in the last 8760 hours. HbA1C: No results for input(s): HGBA1C in the last 72 hours. CBG: Recent Labs  Lab 12/16/21 0751  GLUCAP 229*   Lipid Profile: No results for input(s): CHOL, HDL, LDLCALC, TRIG, CHOLHDL, LDLDIRECT in the last 72 hours. Thyroid Function Tests: No results for input(s): TSH, T4TOTAL, FREET4, T3FREE, THYROIDAB in the last 72 hours. Anemia Panel: No results for input(s): VITAMINB12, FOLATE, FERRITIN, TIBC, IRON, RETICCTPCT in the last 72 hours. Urine analysis:    Component Value Date/Time   COLORURINE YELLOW 07/27/2021 0104   APPEARANCEUR HAZY (A) 07/27/2021 0104   LABSPEC >1.030 (H) 07/27/2021 0104   PHURINE 6.0 07/27/2021 0104   GLUCOSEU NEGATIVE 07/27/2021 0104   HGBUR LARGE (A) 07/27/2021 0104   BILIRUBINUR NEGATIVE 07/27/2021 0104   KETONESUR NEGATIVE 07/27/2021 0104   PROTEINUR NEGATIVE 07/27/2021 0104   UROBILINOGEN 0.2 12/04/2009 1301   NITRITE NEGATIVE 07/27/2021 0104   LEUKOCYTESUR NEGATIVE 07/27/2021 0104    Radiological Exams on Admission - Personally Reviewed: DG Chest Port 1 View  Result Date: 12/16/2021 CLINICAL DATA:  Chest pain, cough EXAM: PORTABLE CHEST 1 VIEW COMPARISON:  12/01/2021 FINDINGS: Lungs volumes are small, but are symmetric and are clear. No pneumothorax or pleural effusion. Cardiac size within normal limits. Vascular crowding at the hila. Osseous structures are age-appropriate. No acute bone abnormality. IMPRESSION: Pulmonary hypoinflation Electronically Signed   By: Fidela Salisbury M.D.   On: 12/16/2021 02:31    EKG: Personally reviewed.   Rhythm is sinus tachycardia with heart rate of 135 bpm.  No dynamic ST segment changes appreciated.  Assessment/Plan  * Severe persistent asthma with exacerbation and Hypoxia - (present on admission) Patient presenting with progressively worsening shortness of breath and wheezing Patient suffers from morbid obesity and likely some degree of obesity hypoventilation syndrome compounding patient's presentation of shortness of breath  Patient was diagnosed with COVID-19 via PCR 12/19 and was treated with a course of Molnupirivir at the time That being said, COVID-19 and influenza PCR testing now are both negative.  I do not believe patient's current symptoms are related to residual COVID-19 infection and therefore do not  believe that COVID-19 isolation is necessary Chest x-ray does not reveal evidence of a definitive new pneumonia however the x-ray is limited due to patient's body habitus D-dimer is unremarkable Will obtain BNP and follow this up with an echocardiogram in case patient is developing some degree of cardiogenic pulmonary edema.  If BNP is elevated will initiate intravenous diuretics  Will obtain respiratory viral panel to assess for other contributing viruses.  We will additionally obtain ABG Obtaining CRP and procalcitonin in case there is an underlying superimposed bacterial infectious process SpO2 89% on arrival necessitating initiation of supplemental oxygen therapy.  Titrating supplemental oxygen to saturations of 90 to 96% Treating with systemic steroids and aggressive bronchodilator therapy Ordering as needed antitussives for patient's associated substantial cough which I am certain is contributing to her discomfort  Hypokalemia- (present on admission) Replacing with potassium chloride Evaluating for concurrent hypomagnesemia  Monitoring potassium levels with serial chemistries.   Type 2 diabetes mellitus without complication, without long-term current use of insulin  (HCC) Patient been placed on Accu-Cheks before every meal and nightly with sliding scale insulin Holding home regimen of hypoglycemics Hemoglobin A1C ordered Diabetic Diet   Bipolar 1 disorder ([place])- (present on admission) Continuing home regimen of psychotropic medications  GERD without esophagitis- (present on admission) Continuing home regimen of daily PPI therapy.   OSA (obstructive sleep apnea)- (present on admission) We will resume CPAP nightly if patient is compliant with this at home      Code Status:  Full code  code status decision has been confirmed with: patient Family Communication: deferred   Status is: Observation  The patient remains OBS appropriate and will d/c before 2 midnights.       Vernelle Emerald MD [facility] Pager [phone]  If 7PM-7AM, please contact night-coverage [email or web address] Use universal [place] password for that web site. If you do not have the password, please call the hospital operator.  12/16/2021, 9:12 AM

## 2021-12-16 NOTE — Assessment & Plan Note (Signed)
.   Patient been placed on Accu-Cheks before every meal and nightly with sliding scale insulin . Holding home regimen of hypoglycemics . Hemoglobin A1C ordered . Diabetic Diet  

## 2021-12-17 ENCOUNTER — Inpatient Hospital Stay (HOSPITAL_COMMUNITY): Payer: Medicaid Other

## 2021-12-17 DIAGNOSIS — J4551 Severe persistent asthma with (acute) exacerbation: Secondary | ICD-10-CM | POA: Diagnosis not present

## 2021-12-17 DIAGNOSIS — F319 Bipolar disorder, unspecified: Secondary | ICD-10-CM | POA: Diagnosis not present

## 2021-12-17 DIAGNOSIS — G4733 Obstructive sleep apnea (adult) (pediatric): Secondary | ICD-10-CM | POA: Diagnosis not present

## 2021-12-17 DIAGNOSIS — E876 Hypokalemia: Secondary | ICD-10-CM | POA: Diagnosis not present

## 2021-12-17 LAB — GLUCOSE, CAPILLARY
Glucose-Capillary: 249 mg/dL — ABNORMAL HIGH (ref 70–99)
Glucose-Capillary: 259 mg/dL — ABNORMAL HIGH (ref 70–99)

## 2021-12-17 LAB — COMPREHENSIVE METABOLIC PANEL
ALT: 16 U/L (ref 0–44)
AST: 15 U/L (ref 15–41)
Albumin: 3.2 g/dL — ABNORMAL LOW (ref 3.5–5.0)
Alkaline Phosphatase: 46 U/L (ref 38–126)
Anion gap: 6 (ref 5–15)
BUN: 14 mg/dL (ref 6–20)
CO2: 28 mmol/L (ref 22–32)
Calcium: 9 mg/dL (ref 8.9–10.3)
Chloride: 107 mmol/L (ref 98–111)
Creatinine, Ser: 0.72 mg/dL (ref 0.44–1.00)
GFR, Estimated: >60 mL/min (ref >60)
Glucose, Bld: 127 mg/dL — ABNORMAL HIGH (ref 70–99)
Potassium: 4.5 mmol/L (ref 3.5–5.1)
Sodium: 141 mmol/L (ref 135–145)
Total Bilirubin: 0.4 mg/dL (ref 0.3–1.2)
Total Protein: 7.5 g/dL (ref 6.5–8.1)

## 2021-12-17 LAB — CBC WITH DIFFERENTIAL/PLATELET
Abs Immature Granulocytes: 0.06 10*3/uL (ref 0.00–0.07)
Basophils Absolute: 0 10*3/uL (ref 0.0–0.1)
Basophils Relative: 0 %
Eosinophils Absolute: 0 10*3/uL (ref 0.0–0.5)
Eosinophils Relative: 0 %
HCT: 35.6 % — ABNORMAL LOW (ref 36.0–46.0)
Hemoglobin: 10.7 g/dL — ABNORMAL LOW (ref 12.0–15.0)
Immature Granulocytes: 0 %
Lymphocytes Relative: 14 %
Lymphs Abs: 2 10*3/uL (ref 0.7–4.0)
MCH: 26.6 pg (ref 26.0–34.0)
MCHC: 30.1 g/dL (ref 30.0–36.0)
MCV: 88.6 fL (ref 80.0–100.0)
Monocytes Absolute: 0.9 10*3/uL (ref 0.1–1.0)
Monocytes Relative: 6 %
Neutro Abs: 11.4 10*3/uL — ABNORMAL HIGH (ref 1.7–7.7)
Neutrophils Relative %: 80 %
Platelets: 412 10*3/uL — ABNORMAL HIGH (ref 150–400)
RBC: 4.02 MIL/uL (ref 3.87–5.11)
RDW: 15.9 % — ABNORMAL HIGH (ref 11.5–15.5)
WBC: 14.4 10*3/uL — ABNORMAL HIGH (ref 4.0–10.5)
nRBC: 0 % (ref 0.0–0.2)

## 2021-12-17 LAB — PHOSPHORUS: Phosphorus: 3.5 mg/dL (ref 2.5–4.6)

## 2021-12-17 LAB — HEMOGLOBIN A1C
Hgb A1c MFr Bld: 6.1 % — ABNORMAL HIGH (ref 4.8–5.6)
Mean Plasma Glucose: 128.37 mg/dL

## 2021-12-17 LAB — CBG MONITORING, ED
Glucose-Capillary: 124 mg/dL — ABNORMAL HIGH (ref 70–99)
Glucose-Capillary: 122 mg/dL — ABNORMAL HIGH (ref 70–99)

## 2021-12-17 LAB — MAGNESIUM: Magnesium: 2.4 mg/dL (ref 1.7–2.4)

## 2021-12-17 IMAGING — MR MR HEAD WO/W CM
14 series · 48 of 48 positions shown · IV contrast (gadavist)
Comparison: CT head [DATE]

CLINICAL DATA: Neuro deficit, headache, stroke suspected

EXAM:
MRI HEAD WITHOUT AND WITH CONTRAST
TECHNIQUE: Multiplanar, multiecho pulse sequences of the brain and surrounding
structures were obtained without and with intravenous contrast.
CONTRAST:  10mL GADAVIST GADOBUTROL 1 MMOL/ML IV SOLN

[Series 5: DWI · axial · 3.0mm · 1.36mm/px · z∈[-13,+120]mm · 6 of 96 slices shown (1 of 2)]
[im 1/96]
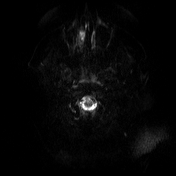
[im 20/96]
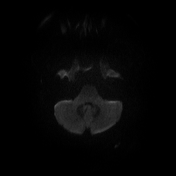
[im 39/96]
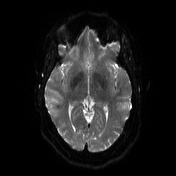
[im 58/96]
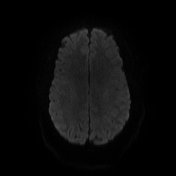
[im 77/96]
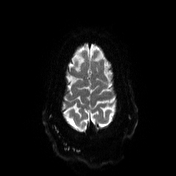
[im 96/96]
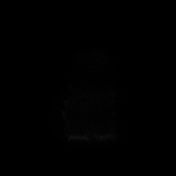

[Series 6: DWI · axial · 3.0mm · 1.36mm/px · z∈[-13,+120]mm · 3 of 48 slices shown (2 of 2)]
[im 1/48]
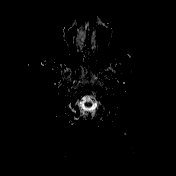
[im 24/48]
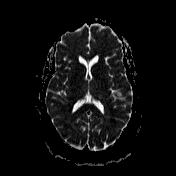
[im 48/48]
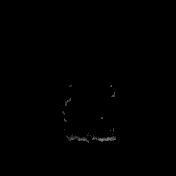

[Series 7: T1 · sagittal · 5.0mm · 0.75mm/px · 1 of 24 slices shown (1 of 2)]
[im 1/24]
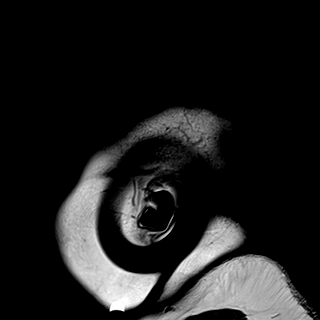

[Series 8: T2 · axial · 5.0mm · 0.62mm/px · 1 of 22 slices shown]
[im 1/22]
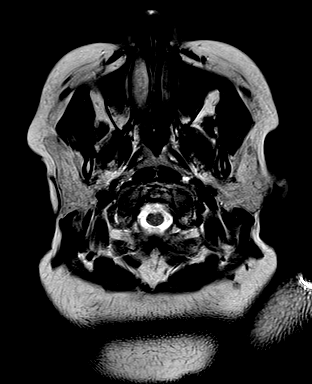

[Series 9: swi_images · axial · 3.0mm · 0.75mm/px · z∈[-52,+149]mm · 4 of 72 slices shown]
[im 1/72]
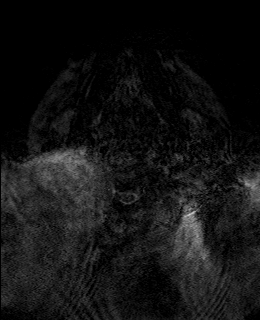
[im 24/72]
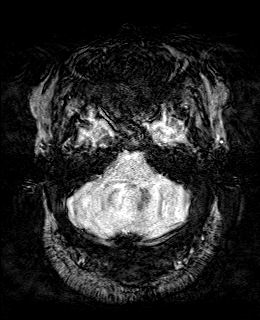
[im 48/72]
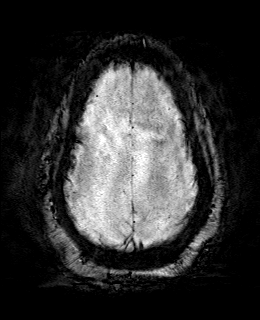
[im 72/72]
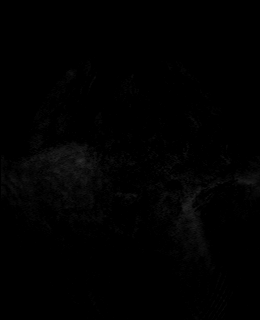

[Series 11: FLAIR · axial · 3.0mm · 0.75mm/px · z∈[-24,+120]mm · 3 of 52 slices shown]
[im 1/52]
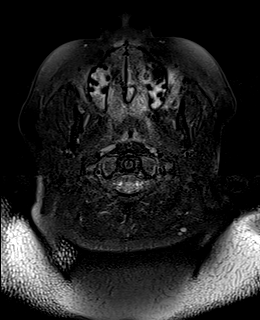
[im 26/52]
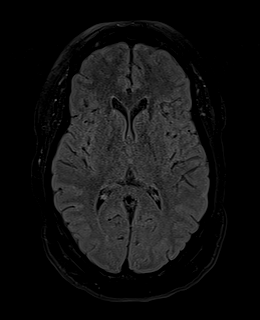
[im 52/52]
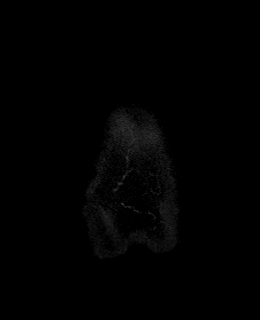

[Series 12: T1 · axial · 1.0mm · 0.94mm/px · z∈[-15,+119]mm · 9 of 144 slices shown (2 of 2)]
[im 1/144]
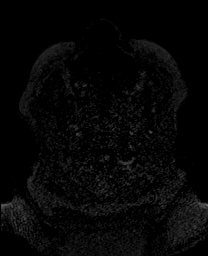
[im 18/144]
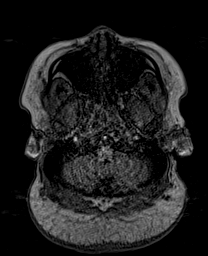
[im 36/144]
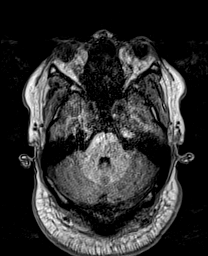
[im 54/144]
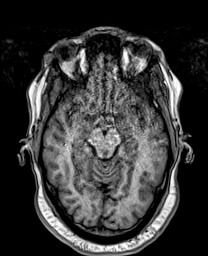
[im 72/144]
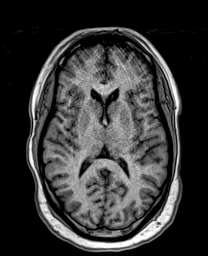
[im 90/144]
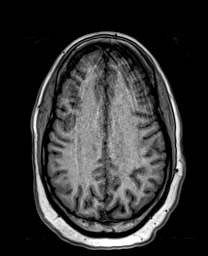
[im 108/144]
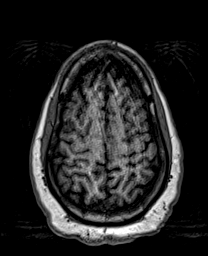
[im 126/144]
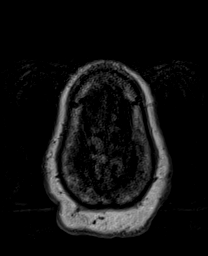
[im 144/144]
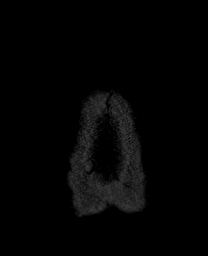

[Series 13: cor dwi_tracew · coronal · 5.0mm · 1.53mm/px · 4 of 58 slices shown]
[im 1/58]
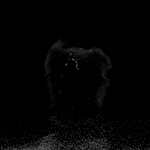
[im 20/58]
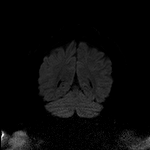
[im 39/58]
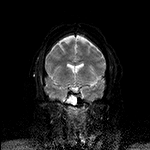
[im 58/58]
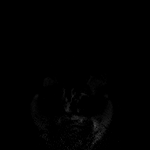

[Series 14: cor dwi_adc · coronal · 5.0mm · 1.53mm/px · 2 of 29 slices shown]
[im 1/29]
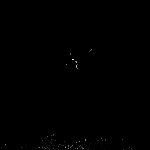
[im 29/29]
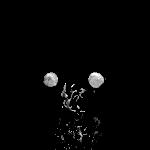

[Series 15: T2 post-contrast · coronal · 5.0mm · 0.57mm/px · 2 of 29 slices shown]
[im 1/29]
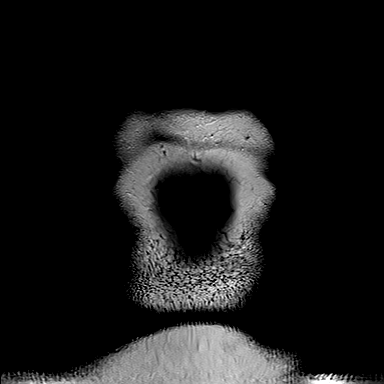
[im 29/29]
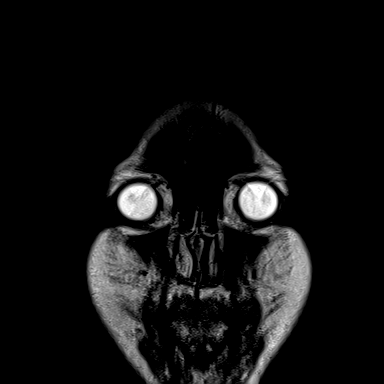

[Series 16: T1 post-contrast · axial · 1.0mm · 0.94mm/px · z∈[-15,+119]mm · 9 of 144 slices shown (1 of 4)]
[im 1/144]
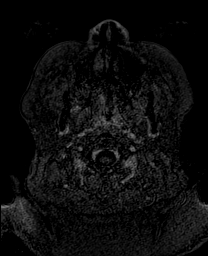
[im 18/144]
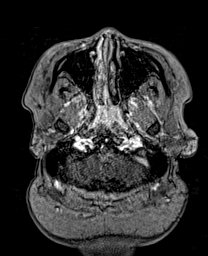
[im 36/144]
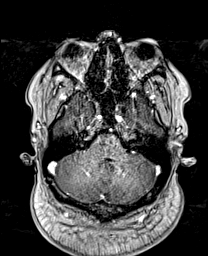
[im 54/144]
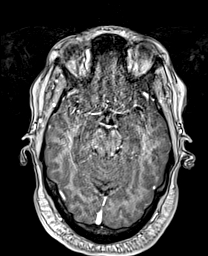
[im 72/144]
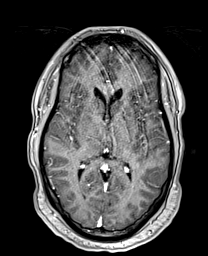
[im 90/144]
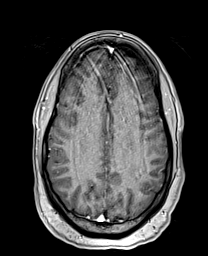
[im 108/144]
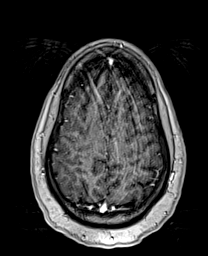
[im 126/144]
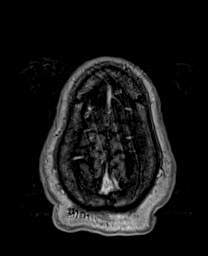
[im 144/144]
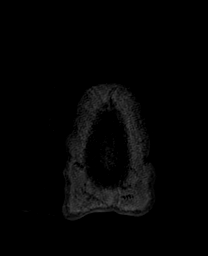

[Series 17: T1 post-contrast · coronal · 5.0mm · 0.43mm/px · 2 of 29 slices shown (2 of 4)]
[im 1/29]
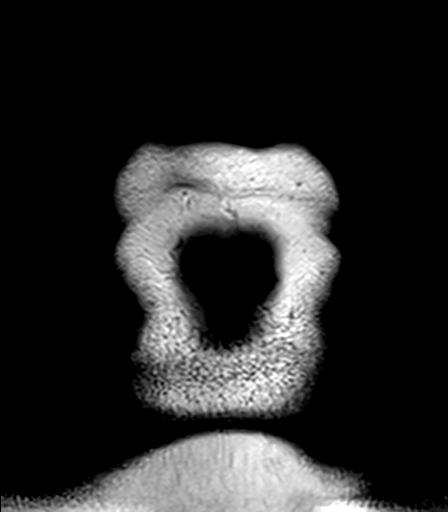
[im 29/29]
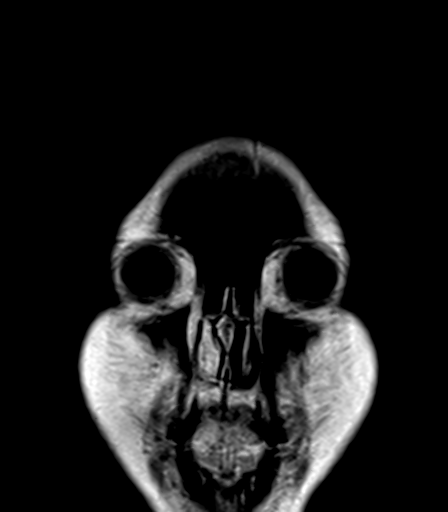

[Series 18: T1 post-contrast · sagittal · 5.0mm · 0.94mm/px · 1 of 24 slices shown (3 of 4)]
[im 1/24]
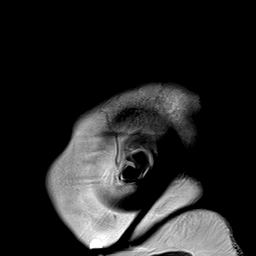

[Series 19: T1 post-contrast · sagittal · 5.0mm · 0.94mm/px · 1 of 24 slices shown (4 of 4)]
[im 1/24]
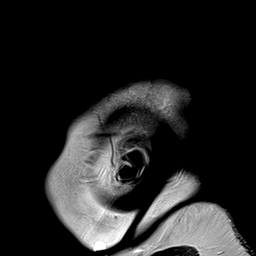

[48 of 48 positions shown; findings below may reference images not displayed]

FINDINGS: Brain: There is no evidence of acute intracranial hemorrhage,
extra-axial fluid collection, or acute infarct.

Parenchymal volume is normal. There is no parenchymal signal
abnormality. The ventricles are normal in size.

There is no abnormal enhancement. There is no mass lesion. There is
no midline shift.

Vascular: Normal flow voids.

Skull and upper cervical spine: Choose

Sinuses/Orbits: There is a mucous retention cyst in the right
sphenoid sinus. The globes and orbits are unremarkable.

Other: None.
IMPRESSION: Normal appearance of the brain with no acute intracranial pathology.

## 2021-12-17 MED ORDER — BUDESONIDE 0.25 MG/2ML IN SUSP
0.2500 mg | Freq: Two times a day (BID) | RESPIRATORY_TRACT | Status: DC
  Administered 2021-12-17 – 2021-12-21 (×8): 0.25 mg via RESPIRATORY_TRACT
  Filled 2021-12-17 (×9): qty 2

## 2021-12-17 MED ORDER — ALPRAZOLAM 0.5 MG PO TABS
0.5000 mg | ORAL_TABLET | Freq: Three times a day (TID) | ORAL | Status: DC | PRN
  Administered 2021-12-18: 0.5 mg via ORAL
  Filled 2021-12-17: qty 1

## 2021-12-17 MED ORDER — METHYLPREDNISOLONE SODIUM SUCC 125 MG IJ SOLR
60.0000 mg | Freq: Two times a day (BID) | INTRAMUSCULAR | Status: DC
  Administered 2021-12-17 – 2021-12-19 (×5): 60 mg via INTRAVENOUS
  Filled 2021-12-17 (×5): qty 2

## 2021-12-17 MED ORDER — GADOBUTROL 1 MMOL/ML IV SOLN
10.0000 mL | Freq: Once | INTRAVENOUS | Status: AC | PRN
  Administered 2021-12-17: 10 mL via INTRAVENOUS

## 2021-12-17 NOTE — ED Notes (Signed)
Staff offered to assist patient to take out lip ring so she could go to MRI, patient refused.

## 2021-12-17 NOTE — Progress Notes (Signed)
PROGRESS NOTE    Felicia Bradley  TFT:[id number] DOB: Apr 17, 1991 DOA: 12/16/2021 PCP: [facility]    Brief Narrative:  31 year old female with a history of diabetes, bipolar disorder, obstructive sleep apnea, asthma, previous history of DVT/PE in 2016, presents to the hospital with cough, wheezing and shortness of breath.  Found to have asthma exacerbation and started on steroids.  She tested negative for COVID-19 and influenza.  Interestingly, she did test positive for COVID on 12/19 and was treated with a course of molnupiravir.  She is admitted with asthma exacerbation and started on steroids and bronchodilators.  She also complains of bilateral diplopia and left arm tingling.  She is undergoing further work-up with MRI brain.   Assessment & Plan:   Principal Problem:   Severe persistent asthma with exacerbation Active Problems:   Type 2 diabetes mellitus without complication, without long-term current use of insulin (HCC)   OSA (obstructive sleep apnea)   Hypokalemia   Bipolar 1 disorder (HCC)   GERD without esophagitis   Asthma exacerbation -Continues to have shortness of breath and wheezing -Continue on IV steroids -Continue bronchodilators -BNP normal -Chest x-ray without evidence of pneumonia -Procalcitonin is negative -COVID-19 and influenza negative -ABG shows pH is 7.33, PCO2 47, PO2 83  Bilateral diplopia -Symptoms appear to be present since last night -She also endorses left arm tingling -Review of prior notes indicate that the symptoms were present on her last ER visit on 12/19 -Discussed with neurology, Dr. Selina Cooley, will check MRI brain with and without contrast  Hypokalemia -Replace  Type 2 diabetes -She is on semaglutide at home -Continue on sliding scale insulin -A1c 6.1  Obstructive sleep apnea -Continue on CPAP  Bipolar 1 disorder -Continued on home psychotropics  GERD -Continue on PPI  Morbid obesity -BMI 52.7 -Counseled on  importance of diet and exercise   DVT prophylaxis:   Code Status: Full code Family Communication: Discussed with patient Disposition Plan: Status is: Inpatient  Remains inpatient appropriate because: Continued work-up of neurodeficits and further treatment of asthma exacerbation with IV steroids.       Consultants:    Procedures:    Antimicrobials:      Subjective: Reports continued shortness of breath, although mildly better than yesterday.  She does have a cough which is productive.  Reports that she has been having bilateral diplopia.  She says this is been present for the last few hours, although I note from yesterday at 8:30 PM indicates that it was present at that time.  She reports a right-sided headache.  Reports that she has had migraines in the past, but current headache is not similar to previous migraines.  She describes some tingling in her left arm.  Objective: Vitals:   12/17/21 0430 12/17/21 0756 12/17/21 1106 12/17/21 1107  BP: 101/83 121/68 (!) 117/92 (!) 117/92  Pulse: 86 80  94  Resp: 18 19 18  (!) 22  Temp:      TempSrc:      SpO2: 99% 100%  100%  Weight:      Height:       No intake or output data in the 24 hours ending 12/17/21 1155 Filed Weights   12/16/21 0202  Weight: 135.2 kg    Examination:  General exam: Appears calm and comfortable  Respiratory system: bilateral wheezing. Respiratory effort normal. Cardiovascular system: S1 & S2 heard, RRR. No JVD, murmurs, rubs, gallops or clicks. No pedal edema. Gastrointestinal system: Abdomen is nondistended, soft and nontender. No  organomegaly or masses felt. Normal bowel sounds heard. Central nervous system: Alert and oriented. Decreased grip strength in left hand, strength equal in LE bilaterally, eyes roll up into head on eye exam making checking pupils challenging. Extremities: Symmetric 5 x 5 power. Skin: No rashes, lesions or ulcers Psychiatry: Judgement and insight appear normal. Mood &  affect appropriate.     Data Reviewed: I have personally reviewed following labs and imaging studies  CBC: Recent Labs  Lab 12/16/21 0214 12/17/21 0610  WBC 16.5* 14.4*  NEUTROABS 11.4* 11.4*  HGB 10.8* 10.7*  HCT 35.8* 35.6*  MCV 86.9 88.6  PLT 416* 412*   Basic Metabolic Panel: Recent Labs  Lab 12/16/21 0214 12/17/21 0610  NA 140 141  K 3.4* 4.5  CL 107 107  CO2 25 28  GLUCOSE 134* 127*  BUN 10 14  CREATININE 0.75 0.72  CALCIUM 8.6* 9.0  MG  --  2.4  PHOS  --  3.5   GFR: Estimated Creatinine Clearance: 137.5 mL/min (by C-G formula based on SCr of 0.72 mg/dL). Liver Function Tests: Recent Labs  Lab 12/17/21 0610  AST 15  ALT 16  ALKPHOS 46  BILITOT 0.4  PROT 7.5  ALBUMIN 3.2*   No results for input(s): LIPASE, AMYLASE in the last 168 hours. No results for input(s): AMMONIA in the last 168 hours. Coagulation Profile: No results for input(s): INR, PROTIME in the last 168 hours. Cardiac Enzymes: No results for input(s): CKTOTAL, CKMB, CKMBINDEX, TROPONINI in the last 168 hours. BNP (last 3 results) No results for input(s): PROBNP in the last 8760 hours. HbA1C: Recent Labs    12/17/21 0610  HGBA1C 6.1*   CBG: Recent Labs  Lab 12/16/21 0751 12/16/21 1209 12/16/21 1645 12/16/21 2120 12/17/21 0750  GLUCAP 229* 219* 209* 136* 124*   Lipid Profile: No results for input(s): CHOL, HDL, LDLCALC, TRIG, CHOLHDL, LDLDIRECT in the last 72 hours. Thyroid Function Tests: No results for input(s): TSH, T4TOTAL, FREET4, T3FREE, THYROIDAB in the last 72 hours. Anemia Panel: No results for input(s): VITAMINB12, FOLATE, FERRITIN, TIBC, IRON, RETICCTPCT in the last 72 hours. Sepsis Labs: Recent Labs  Lab 12/16/21 0214 12/16/21 0250  PROCALCITON <0.10  --   LATICACIDVEN  --  1.5    Recent Results (from the past 240 hour(s))  Resp Panel by RT-PCR (Flu A&B, Covid) Nasopharyngeal Swab     Status: None   Collection Time: 12/16/21  2:21 AM   Specimen:  Nasopharyngeal Swab; Nasopharyngeal(NP) swabs in vial transport medium  Result Value Ref Range Status   SARS Coronavirus 2 by RT PCR NEGATIVE NEGATIVE Final    Comment: (NOTE) SARS-CoV-2 target nucleic acids are NOT DETECTED.  The SARS-CoV-2 RNA is generally detectable in upper respiratory specimens during the acute phase of infection. The lowest concentration of SARS-CoV-2 viral copies this assay can detect is 138 copies/mL. A negative result does not preclude SARS-Cov-2 infection and should not be used as the sole basis for treatment or other patient management decisions. A negative result may occur with  improper specimen collection/handling, submission of specimen other than nasopharyngeal swab, presence of viral mutation(s) within the areas targeted by this assay, and inadequate number of viral copies(<138 copies/mL). A negative result must be combined with clinical observations, patient history, and epidemiological information. The expected result is Negative.  Fact Sheet for Patients:  BloggerCourse.comhttps://www.fda.gov/media/152166/download  Fact Sheet for Healthcare Providers:  SeriousBroker.ithttps://www.fda.gov/media/152162/download  This test is no t yet approved or cleared by the [place] FDA and  has been authorized for detection and/or diagnosis of SARS-CoV-2 by FDA under an Emergency Use Authorization (EUA). This EUA will remain  in effect (meaning this test can be used) for the duration of the COVID-19 declaration under Section 564(b)(1) of the Act, 21 U.S.C.section 360bbb-3(b)(1), unless the authorization is terminated  or revoked sooner.       Influenza A by PCR NEGATIVE NEGATIVE Final   Influenza B by PCR NEGATIVE NEGATIVE Final    Comment: (NOTE) The Xpert Xpress SARS-CoV-2/FLU/RSV plus assay is intended as an aid in the diagnosis of influenza from Nasopharyngeal swab specimens and should not be used as a sole basis for treatment. Nasal washings and aspirates are unacceptable for  Xpert Xpress SARS-CoV-2/FLU/RSV testing.  Fact Sheet for Patients: BloggerCourse.comhttps://www.fda.gov/media/152166/download  Fact Sheet for Healthcare Providers: SeriousBroker.ithttps://www.fda.gov/media/152162/download  This test is not yet approved or cleared by the [place] FDA and has been authorized for detection and/or diagnosis of SARS-CoV-2 by FDA under an Emergency Use Authorization (EUA). This EUA will remain in effect (meaning this test can be used) for the duration of the COVID-19 declaration under Section 564(b)(1) of the Act, 21 U.S.C. section 360bbb-3(b)(1), unless the authorization is terminated or revoked.  Performed at [facility], [address] [place], [place] [address]   Culture, blood (routine x 2)     Status: None (Preliminary result)   Collection Time: 12/16/21  2:50 AM   Specimen: BLOOD  Result Value Ref Range Status   Specimen Description   Final    BLOOD RIGHT ANTECUBITAL Performed at [facility], [address] [place], [place] [address]    Special Requests   Final    BOTTLES DRAWN AEROBIC AND ANAEROBIC Blood Culture adequate volume Performed at [facility], [address] [place], [place] [address]    Culture   Final    NO GROWTH 1 DAY Performed at [facility], [address] [place], [place] [address]    Report Status PENDING  Incomplete  Culture, blood (routine x 2)     Status: None (Preliminary result)   Collection Time: 12/16/21  2:50 AM   Specimen: BLOOD  Result Value Ref Range Status   Specimen Description   Final    BLOOD BLOOD RIGHT FOREARM Performed at [facility], [address] [place], [place] [address]    Special Requests   Final    BOTTLES DRAWN AEROBIC AND ANAEROBIC Blood Culture adequate volume Performed at [facility], [address] [place], [place] [address]    Culture   Final    NO GROWTH 1 DAY Performed at [facility],  [address] [place], [place] [address]    Report Status PENDING  Incomplete  Respiratory (~20 pathogens) panel by PCR     Status: None   Collection Time: 12/16/21 10:03 AM   Specimen: Nasopharyngeal Swab; Respiratory  Result Value Ref Range Status   Adenovirus NOT DETECTED NOT DETECTED Final   Coronavirus 229E NOT DETECTED NOT DETECTED Final    Comment: (NOTE) The Coronavirus on the Respiratory Panel, DOES NOT test for the novel  Coronavirus (2019 nCoV)    Coronavirus HKU1 NOT DETECTED NOT DETECTED Final   Coronavirus NL63 NOT DETECTED NOT DETECTED Final   Coronavirus OC43 NOT DETECTED NOT DETECTED Final   Metapneumovirus NOT DETECTED NOT DETECTED Final   Rhinovirus / Enterovirus NOT DETECTED NOT DETECTED Final   Influenza A NOT DETECTED NOT DETECTED Final  Influenza B NOT DETECTED NOT DETECTED Final   Parainfluenza Virus 1 NOT DETECTED NOT DETECTED Final   Parainfluenza Virus 2 NOT DETECTED NOT DETECTED Final   Parainfluenza Virus 3 NOT DETECTED NOT DETECTED Final   Parainfluenza Virus 4 NOT DETECTED NOT DETECTED Final   Respiratory Syncytial Virus NOT DETECTED NOT DETECTED Final   Bordetella pertussis NOT DETECTED NOT DETECTED Final   Bordetella Parapertussis NOT DETECTED NOT DETECTED Final   Chlamydophila pneumoniae NOT DETECTED NOT DETECTED Final   Mycoplasma pneumoniae NOT DETECTED NOT DETECTED Final    Comment: Performed at [facility], [address], [place], [place] [address]         Radiology Studies: DG Chest Port 1 View  Result Date: 12/16/2021 CLINICAL DATA:  Chest pain, cough EXAM: PORTABLE CHEST 1 VIEW COMPARISON:  12/01/2021 FINDINGS: Lungs volumes are small, but are symmetric and are clear. No pneumothorax or pleural effusion. Cardiac size within normal limits. Vascular crowding at the hila. Osseous structures are age-appropriate. No acute bone abnormality. IMPRESSION: Pulmonary hypoinflation Electronically Signed   By: Helyn Numbers M.D.   On:  12/16/2021 02:31   ECHOCARDIOGRAM COMPLETE  Result Date: 12/16/2021    ECHOCARDIOGRAM REPORT   Patient Name:   Felicia Bradley Brahmbhatt Date of Exam: 12/16/2021 Medical Rec #:  [id number]      Height:       63.0 in Accession #:    [id number]     Weight:       298.0 lb Date of Birth:  10-20-1991     BSA:          2.291 m Patient Age:    30 years       BP:           106/80 mmHg Patient Gender: F              HR:           83 bpm. Exam Location:  Inpatient Procedure: 2D Echo, Color Doppler and Cardiac Doppler Indications:    CHF-Acute Diastolic I50.31  History:        Patient has prior history of Echocardiogram examinations, most                 recent 02/17/2021. Signs/Symptoms:Shortness of Breath; Risk                 Factors:Diabetes and Sleep Apnea. History of DVT/PE 2016. Severe                 asthma.  Sonographer:    Leta Jungling RDCS Referring Phys: [id number] Deno Lunger [facility]  Sonographer Comments: Pastient unable to follow commands. Very sleepy through exam. IMPRESSIONS  1. Left ventricular ejection fraction, by estimation, is 60 to 65%. The left ventricle has normal function. The left ventricle has no regional wall motion abnormalities. Left ventricular diastolic parameters were normal.  2. Right ventricular systolic function is normal. The right ventricular size is normal. There is mildly elevated pulmonary artery systolic pressure.  3. The mitral valve is normal in structure. No evidence of mitral valve regurgitation. No evidence of mitral stenosis.  4. The aortic valve is normal in structure. Aortic valve regurgitation is not visualized. No aortic stenosis is present.  5. The inferior vena cava is normal in size with greater than 50% respiratory variability, suggesting right atrial pressure of 3 mmHg. FINDINGS  Left Ventricle: Left ventricular ejection fraction, by estimation, is 60 to 65%. The left ventricle has normal function.  The left ventricle has no regional wall motion abnormalities. The left ventricular  internal cavity size was normal in size. There is  no left ventricular hypertrophy. Left ventricular diastolic parameters were normal. Right Ventricle: The right ventricular size is normal. No increase in right ventricular wall thickness. Right ventricular systolic function is normal. There is mildly elevated pulmonary artery systolic pressure. The tricuspid regurgitant velocity is 2.85  m/s, and with an assumed right atrial pressure of 8 mmHg, the estimated right ventricular systolic pressure is 40.5 mmHg. Left Atrium: Left atrial size was normal in size. Right Atrium: Right atrial size was normal in size. Pericardium: There is no evidence of pericardial effusion. Mitral Valve: The mitral valve is normal in structure. No evidence of mitral valve regurgitation. No evidence of mitral valve stenosis. Tricuspid Valve: The tricuspid valve is normal in structure. Tricuspid valve regurgitation is trivial. No evidence of tricuspid stenosis. Aortic Valve: The aortic valve is normal in structure. Aortic valve regurgitation is not visualized. No aortic stenosis is present. Pulmonic Valve: The pulmonic valve was normal in structure. Pulmonic valve regurgitation is not visualized. No evidence of pulmonic stenosis. Aorta: The aortic root is normal in size and structure. Venous: The inferior vena cava is normal in size with greater than 50% respiratory variability, suggesting right atrial pressure of 3 mmHg. IAS/Shunts: No atrial level shunt detected by color flow Doppler.  LEFT VENTRICLE PLAX 2D LVIDd:         5.30 cm   Diastology LVIDs:         2.90 cm   LV e' medial:    7.40 cm/s LV PW:         0.90 cm   LV E/e' medial:  11.7 LV IVS:        0.90 cm   LV e' lateral:   5.66 cm/s LVOT diam:     2.00 cm   LV E/e' lateral: 15.3 LV SV:         54 LV SV Index:   24 LVOT Area:     3.14 cm  RIGHT VENTRICLE RV S prime:     22.80 cm/s TAPSE (M-mode): 2.1 cm LEFT ATRIUM             Index        RIGHT ATRIUM           Index LA diam:         3.60 cm 1.57 cm/m   RA Area:     12.30 cm LA Vol (A2C):   32.2 ml 14.06 ml/m  RA Volume:   27.30 ml  11.92 ml/m LA Vol (A4C):   40.5 ml 17.68 ml/m LA Biplane Vol: 36.2 ml 15.80 ml/m  AORTIC VALVE LVOT Vmax:   90.80 cm/s LVOT Vmean:  67.700 cm/s LVOT VTI:    0.173 m  AORTA Ao Root diam: 3.00 cm Ao Asc diam:  2.80 cm MITRAL VALVE               TRICUSPID VALVE MV Area (PHT): 6.65 cm    TR Peak grad:   32.5 mmHg MV Decel Time: 114 msec    TR Vmax:        285.00 cm/s MV E velocity: 86.50 cm/s MV A velocity: 59.10 cm/s  SHUNTS MV E/A ratio:  1.46        Systemic VTI:  0.17 m  Systemic Diam: 2.00 cm Kardie Tobb DO Electronically signed by Thomasene Ripple DO Signature Date/Time: 12/16/2021/2:47:43 PM    Final         Scheduled Meds:  ALPRAZolam  0.5 mg Oral TID   aspirin EC  81 mg Oral Daily   enoxaparin (LOVENOX) injection  60 mg Subcutaneous Q24H   gabapentin  300 mg Oral TID   insulin aspart  0-15 Units Subcutaneous TID AC & HS   ipratropium-albuterol  3 mL Nebulization TID   lurasidone  20 mg Oral Daily   predniSONE  50 mg Oral Q breakfast   Continuous Infusions:   LOS: 1 day    Time spent:    Erick Blinks, MD [facility]   If 7PM-7AM, please contact night-coverage [email or web address]  12/17/2021, 11:55 AM

## 2021-12-18 DIAGNOSIS — E876 Hypokalemia: Secondary | ICD-10-CM | POA: Diagnosis not present

## 2021-12-18 DIAGNOSIS — G4733 Obstructive sleep apnea (adult) (pediatric): Secondary | ICD-10-CM | POA: Diagnosis not present

## 2021-12-18 DIAGNOSIS — F319 Bipolar disorder, unspecified: Secondary | ICD-10-CM | POA: Diagnosis not present

## 2021-12-18 DIAGNOSIS — J4551 Severe persistent asthma with (acute) exacerbation: Secondary | ICD-10-CM | POA: Diagnosis not present

## 2021-12-18 LAB — GLUCOSE, CAPILLARY
Glucose-Capillary: 193 mg/dL — ABNORMAL HIGH (ref 70–99)
Glucose-Capillary: 270 mg/dL — ABNORMAL HIGH (ref 70–99)
Glucose-Capillary: 326 mg/dL — ABNORMAL HIGH (ref 70–99)
Glucose-Capillary: 402 mg/dL — ABNORMAL HIGH (ref 70–99)

## 2021-12-18 NOTE — Progress Notes (Signed)
PROGRESS NOTE    Felicia Bradley  VHQ:[id number] DOB: Jun 10, 1991 DOA: 12/16/2021 PCP: [facility]    Brief Narrative:  31 year old female with a history of diabetes, bipolar disorder, obstructive sleep apnea, asthma, previous history of DVT/PE in 2016, presents to the hospital with cough, wheezing and shortness of breath.  Found to have asthma exacerbation and started on steroids.  She tested negative for COVID-19 and influenza.  Interestingly, she did test positive for COVID on 12/19 and was treated with a course of molnupiravir.  She is admitted with asthma exacerbation and started on steroids and bronchodilators.  She also complains of bilateral diplopia and left arm tingling.  She is undergoing further work-up with MRI brain.   Assessment & Plan:   Principal Problem:   Severe persistent asthma with exacerbation Active Problems:   Type 2 diabetes mellitus without complication, without long-term current use of insulin (HCC)   OSA (obstructive sleep apnea)   Hypokalemia   Bipolar 1 disorder (HCC)   GERD without esophagitis   Asthma exacerbation -Continues to have shortness of breath and wheezing -Continue on IV steroids -Continue bronchodilators -BNP normal -Chest x-ray without evidence of pneumonia -Procalcitonin is negative -COVID-19 and influenza negative -ABG shows pH is 7.33, PCO2 47, PO2 83 -Reports that she was supposed to be on 3 L of oxygen at home, prescribed by her pulmonologist at [facility].  She discontinued this on her own.  We will likely need to discharge with supplemental oxygen through advanced home care.  Bilateral diplopia -Symptoms appear to be present since last night -She also endorses left arm tingling -Review of prior notes indicate that the symptoms were present on her last ER visit on 12/19 -Discussed with neurology, Dr. Selina Cooley, will check MRI brain with and without contrast -MRI brain performed and did not show any acute findings.   No further inpatient neuro work-up per neurology.  Hypokalemia -Replace  Type 2 diabetes -She is on semaglutide at home -Continue on sliding scale insulin -A1c 6.1  Obstructive sleep apnea -Continue on CPAP  Bipolar 1 disorder -Continued on home psychotropics  GERD -Continue on PPI  Morbid obesity -BMI 52.7 -Counseled on importance of diet and exercise   DVT prophylaxis:   Code Status: Full code Family Communication: Discussed with patient Disposition Plan: Status is: Inpatient  Remains inpatient appropriate because: Continued work-up of neurodeficits and further treatment of asthma exacerbation with IV steroids.       Consultants:    Procedures:    Antimicrobials:      Subjective: Reports continued cough which is mostly nonproductive.  She becomes very short of breath on minimal exertion.  Feels overall diplopia is doing better today.  Objective: Vitals:   12/18/21 0930 12/18/21 1327 12/18/21 2014 12/18/21 2032  BP: 130/72 (!) 136/121 136/77   Pulse: (!) 103 85 81   Resp: 20 20 18    Temp: 98.4 F (36.9 C) 99.3 F (37.4 C) 98.4 F (36.9 C)   TempSrc: Oral Oral Oral   SpO2: 100% 100% 100% 100%  Weight:      Height:        Intake/Output Summary (Last 24 hours) at 12/18/2021 2143 Last data filed at 12/18/2021 0900 Gross per 24 hour  Intake 360 ml  Output 200 ml  Net 160 ml   Filed Weights   12/16/21 0202 12/17/21 1326  Weight: 135.2 kg 131.1 kg    Examination:  General exam: Alert, awake, oriented x 3 Respiratory system: Wheezing bilaterally. Respiratory  effort normal. Cardiovascular system:RRR. No murmurs, rubs, gallops. Gastrointestinal system: Abdomen is nondistended, soft and nontender. No organomegaly or masses felt. Normal bowel sounds heard. Central nervous system: Alert and oriented. No focal neurological deficits. Extremities: No C/C/E, +pedal pulses Skin: No rashes, lesions or ulcers Psychiatry: Judgement and insight appear  normal. Mood & affect appropriate.      Data Reviewed: I have personally reviewed following labs and imaging studies  CBC: Recent Labs  Lab 12/16/21 0214 12/17/21 0610  WBC 16.5* 14.4*  NEUTROABS 11.4* 11.4*  HGB 10.8* 10.7*  HCT 35.8* 35.6*  MCV 86.9 88.6  PLT 416* 412*   Basic Metabolic Panel: Recent Labs  Lab 12/16/21 0214 12/17/21 0610  NA 140 141  K 3.4* 4.5  CL 107 107  CO2 25 28  GLUCOSE 134* 127*  BUN 10 14  CREATININE 0.75 0.72  CALCIUM 8.6* 9.0  MG  --  2.4  PHOS  --  3.5   GFR: Estimated Creatinine Clearance: 135 mL/min (by C-G formula based on SCr of 0.72 mg/dL). Liver Function Tests: Recent Labs  Lab 12/17/21 0610  AST 15  ALT 16  ALKPHOS 46  BILITOT 0.4  PROT 7.5  ALBUMIN 3.2*   No results for input(s): LIPASE, AMYLASE in the last 168 hours. No results for input(s): AMMONIA in the last 168 hours. Coagulation Profile: No results for input(s): INR, PROTIME in the last 168 hours. Cardiac Enzymes: No results for input(s): CKTOTAL, CKMB, CKMBINDEX, TROPONINI in the last 168 hours. BNP (last 3 results) No results for input(s): PROBNP in the last 8760 hours. HbA1C: Recent Labs    12/17/21 0610  HGBA1C 6.1*   CBG: Recent Labs  Lab 12/17/21 2058 12/18/21 0732 12/18/21 1158 12/18/21 1719 12/18/21 2107  GLUCAP 259* 193* 270* 326* 402*   Lipid Profile: No results for input(s): CHOL, HDL, LDLCALC, TRIG, CHOLHDL, LDLDIRECT in the last 72 hours. Thyroid Function Tests: No results for input(s): TSH, T4TOTAL, FREET4, T3FREE, THYROIDAB in the last 72 hours. Anemia Panel: No results for input(s): VITAMINB12, FOLATE, FERRITIN, TIBC, IRON, RETICCTPCT in the last 72 hours. Sepsis Labs: Recent Labs  Lab 12/16/21 0214 12/16/21 0250  PROCALCITON <0.10  --   LATICACIDVEN  --  1.5    Recent Results (from the past 240 hour(s))  Resp Panel by RT-PCR (Flu A&B, Covid) Nasopharyngeal Swab     Status: None   Collection Time: 12/16/21  2:21 AM    Specimen: Nasopharyngeal Swab; Nasopharyngeal(NP) swabs in vial transport medium  Result Value Ref Range Status   SARS Coronavirus 2 by RT PCR NEGATIVE NEGATIVE Final    Comment: (NOTE) SARS-CoV-2 target nucleic acids are NOT DETECTED.  The SARS-CoV-2 RNA is generally detectable in upper respiratory specimens during the acute phase of infection. The lowest concentration of SARS-CoV-2 viral copies this assay can detect is 138 copies/mL. A negative result does not preclude SARS-Cov-2 infection and should not be used as the sole basis for treatment or other patient management decisions. A negative result may occur with  improper specimen collection/handling, submission of specimen other than nasopharyngeal swab, presence of viral mutation(s) within the areas targeted by this assay, and inadequate number of viral copies(<138 copies/mL). A negative result must be combined with clinical observations, patient history, and epidemiological information. The expected result is Negative.  Fact Sheet for Patients:  BloggerCourse.com  Fact Sheet for Healthcare Providers:  SeriousBroker.it  This test is no t yet approved or cleared by the [place] FDA and  has  been authorized for detection and/or diagnosis of SARS-CoV-2 by FDA under an Emergency Use Authorization (EUA). This EUA will remain  in effect (meaning this test can be used) for the duration of the COVID-19 declaration under Section 564(b)(1) of the Act, 21 U.S.C.section 360bbb-3(b)(1), unless the authorization is terminated  or revoked sooner.       Influenza A by PCR NEGATIVE NEGATIVE Final   Influenza B by PCR NEGATIVE NEGATIVE Final    Comment: (NOTE) The Xpert Xpress SARS-CoV-2/FLU/RSV plus assay is intended as an aid in the diagnosis of influenza from Nasopharyngeal swab specimens and should not be used as a sole basis for treatment. Nasal washings and aspirates are  unacceptable for Xpert Xpress SARS-CoV-2/FLU/RSV testing.  Fact Sheet for Patients: BloggerCourse.comhttps://www.fda.gov/media/152166/download  Fact Sheet for Healthcare Providers: SeriousBroker.ithttps://www.fda.gov/media/152162/download  This test is not yet approved or cleared by the [place] FDA and has been authorized for detection and/or diagnosis of SARS-CoV-2 by FDA under an Emergency Use Authorization (EUA). This EUA will remain in effect (meaning this test can be used) for the duration of the COVID-19 declaration under Section 564(b)(1) of the Act, 21 U.S.C. section 360bbb-3(b)(1), unless the authorization is terminated or revoked.  Performed at [facility], [address] [place], [place] [address]   Culture, blood (routine x 2)     Status: None (Preliminary result)   Collection Time: 12/16/21  2:50 AM   Specimen: BLOOD  Result Value Ref Range Status   Specimen Description   Final    BLOOD RIGHT ANTECUBITAL Performed at [facility], [address] [place], [place] [address]    Special Requests   Final    BOTTLES DRAWN AEROBIC AND ANAEROBIC Blood Culture adequate volume Performed at [facility], [address] [place], [place] [address]    Culture   Final    NO GROWTH 2 DAYS Performed at [facility], [address] [place], [place] [address]    Report Status PENDING  Incomplete  Culture, blood (routine x 2)     Status: None (Preliminary result)   Collection Time: 12/16/21  2:50 AM   Specimen: BLOOD  Result Value Ref Range Status   Specimen Description   Final    BLOOD BLOOD RIGHT FOREARM Performed at [facility], [address] [place], [place] [address]    Special Requests   Final    BOTTLES DRAWN AEROBIC AND ANAEROBIC Blood Culture adequate volume Performed at [facility], [address] [place], [place] [address]    Culture   Final    NO GROWTH 2 DAYS Performed at [facility], [address] [place], [place] [address]    Report Status PENDING  Incomplete  Respiratory (~20 pathogens) panel by PCR     Status: None   Collection Time: 12/16/21 10:03 AM   Specimen: Nasopharyngeal Swab; Respiratory  Result Value Ref Range Status   Adenovirus NOT DETECTED NOT DETECTED Final   Coronavirus 229E NOT DETECTED NOT DETECTED Final    Comment: (NOTE) The Coronavirus on the Respiratory Panel, DOES NOT test for the novel  Coronavirus (2019 nCoV)    Coronavirus HKU1 NOT DETECTED NOT DETECTED Final   Coronavirus NL63 NOT DETECTED NOT DETECTED Final   Coronavirus OC43 NOT DETECTED NOT DETECTED Final   Metapneumovirus NOT DETECTED NOT DETECTED Final   Rhinovirus / Enterovirus NOT DETECTED NOT DETECTED Final   Influenza A NOT DETECTED NOT DETECTED Final  Influenza B NOT DETECTED NOT DETECTED Final   Parainfluenza Virus 1 NOT DETECTED NOT DETECTED Final   Parainfluenza Virus 2 NOT DETECTED NOT DETECTED Final   Parainfluenza Virus 3 NOT DETECTED NOT DETECTED Final   Parainfluenza Virus 4 NOT DETECTED NOT DETECTED Final   Respiratory Syncytial Virus NOT DETECTED NOT DETECTED Final   Bordetella pertussis NOT DETECTED NOT DETECTED Final   Bordetella Parapertussis NOT DETECTED NOT DETECTED Final   Chlamydophila pneumoniae NOT DETECTED NOT DETECTED Final   Mycoplasma pneumoniae NOT DETECTED NOT DETECTED Final    Comment: Performed at [facility], [address] [place], [place] [address]         Radiology Studies: MR BRAIN W WO CONTRAST  Result Date: 12/17/2021 CLINICAL DATA:  Neuro deficit, headache, stroke suspected EXAM: MRI HEAD WITHOUT AND WITH CONTRAST TECHNIQUE: Multiplanar, multiecho pulse sequences of the brain and surrounding structures were obtained without and with intravenous contrast. CONTRAST:  10mL GADAVIST GADOBUTROL 1 MMOL/ML IV SOLN COMPARISON:  CT head 10/26/2020 FINDINGS: Brain: There is no evidence of acute intracranial hemorrhage,  extra-axial fluid collection, or acute infarct. Parenchymal volume is normal. There is no parenchymal signal abnormality. The ventricles are normal in size. There is no abnormal enhancement. There is no mass lesion. There is no midline shift. Vascular: Normal flow voids. Skull and upper cervical spine: Choose Sinuses/Orbits: There is a mucous retention cyst in the right sphenoid sinus. The globes and orbits are unremarkable. Other: None. IMPRESSION: Normal appearance of the brain with no acute intracranial pathology. Electronically Signed   By: Lesia HausenPeter  Noone M.D.   On: 12/17/2021 17:00        Scheduled Meds:  aspirin EC  81 mg Oral Daily   budesonide (PULMICORT) nebulizer solution  0.25 mg Nebulization BID   enoxaparin (LOVENOX) injection  60 mg Subcutaneous Q24H   gabapentin  300 mg Oral TID   insulin aspart  0-15 Units Subcutaneous TID AC & HS   ipratropium-albuterol  3 mL Nebulization TID   lurasidone  20 mg Oral Daily   methylPREDNISolone (SOLU-MEDROL) injection  60 mg Intravenous BID   Continuous Infusions:   LOS: 2 days    Time spent: 35mins    Felicia BlinksJehanzeb Stevon Gough, MD [facility]   If 7PM-7AM, please contact night-coverage [email or web address]  12/18/2021, 9:43 PM

## 2021-12-19 DIAGNOSIS — F319 Bipolar disorder, unspecified: Secondary | ICD-10-CM | POA: Diagnosis not present

## 2021-12-19 DIAGNOSIS — G4733 Obstructive sleep apnea (adult) (pediatric): Secondary | ICD-10-CM | POA: Diagnosis not present

## 2021-12-19 DIAGNOSIS — E876 Hypokalemia: Secondary | ICD-10-CM | POA: Diagnosis not present

## 2021-12-19 DIAGNOSIS — J4551 Severe persistent asthma with (acute) exacerbation: Secondary | ICD-10-CM | POA: Diagnosis not present

## 2021-12-19 LAB — GLUCOSE, CAPILLARY
Glucose-Capillary: 234 mg/dL — ABNORMAL HIGH (ref 70–99)
Glucose-Capillary: 174 mg/dL — ABNORMAL HIGH (ref 70–99)
Glucose-Capillary: 288 mg/dL — ABNORMAL HIGH (ref 70–99)
Glucose-Capillary: 232 mg/dL — ABNORMAL HIGH (ref 70–99)
Glucose-Capillary: 254 mg/dL — ABNORMAL HIGH (ref 70–99)

## 2021-12-19 MED ORDER — IPRATROPIUM-ALBUTEROL 0.5-2.5 (3) MG/3ML IN SOLN
3.0000 mL | Freq: Two times a day (BID) | RESPIRATORY_TRACT | Status: DC
  Administered 2021-12-19 – 2021-12-21 (×3): 3 mL via RESPIRATORY_TRACT
  Filled 2021-12-19 (×4): qty 3

## 2021-12-19 MED ORDER — LABETALOL HCL 5 MG/ML IV SOLN
10.0000 mg | Freq: Once | INTRAVENOUS | Status: AC
  Administered 2021-12-19: 10 mg via INTRAVENOUS
  Filled 2021-12-19: qty 4

## 2021-12-19 MED ORDER — MECLIZINE HCL 25 MG PO TABS
25.0000 mg | ORAL_TABLET | Freq: Three times a day (TID) | ORAL | Status: DC
  Administered 2021-12-19 – 2021-12-21 (×6): 25 mg via ORAL
  Filled 2021-12-19 (×6): qty 1

## 2021-12-19 MED ORDER — PREDNISONE 20 MG PO TABS
40.0000 mg | ORAL_TABLET | Freq: Every day | ORAL | Status: DC
  Administered 2021-12-20 – 2021-12-21 (×2): 40 mg via ORAL
  Filled 2021-12-19 (×2): qty 2

## 2021-12-19 MED ORDER — HYDRALAZINE HCL 20 MG/ML IJ SOLN
10.0000 mg | Freq: Four times a day (QID) | INTRAMUSCULAR | Status: DC | PRN
  Administered 2021-12-19: 10 mg via INTRAVENOUS
  Filled 2021-12-19: qty 1

## 2021-12-19 MED ORDER — HYDROCODONE BIT-HOMATROP MBR 5-1.5 MG/5ML PO SOLN
5.0000 mL | ORAL | Status: DC | PRN
  Administered 2021-12-19 – 2021-12-20 (×3): 5 mL via ORAL
  Filled 2021-12-19 (×3): qty 5

## 2021-12-19 MED ORDER — GUAIFENESIN ER 600 MG PO TB12
600.0000 mg | ORAL_TABLET | Freq: Two times a day (BID) | ORAL | Status: DC
  Administered 2021-12-19 – 2021-12-20 (×4): 600 mg via ORAL
  Filled 2021-12-19 (×4): qty 1

## 2021-12-19 MED ORDER — MORPHINE SULFATE (PF) 2 MG/ML IV SOLN
1.0000 mg | Freq: Once | INTRAVENOUS | Status: AC
  Administered 2021-12-19: 1 mg via INTRAVENOUS
  Filled 2021-12-19: qty 1

## 2021-12-19 MED ORDER — INSULIN ASPART 100 UNIT/ML IJ SOLN
4.0000 [IU] | Freq: Three times a day (TID) | INTRAMUSCULAR | Status: DC
  Administered 2021-12-19 – 2021-12-20 (×2): 4 [IU] via SUBCUTANEOUS

## 2021-12-19 NOTE — Progress Notes (Signed)
PROGRESS NOTE    Felicia Bradley  ZOX:[id number] DOB: 05-Oct-1991 DOA: 12/16/2021 PCP: [facility]    Brief Narrative:  31 year old female with a history of diabetes, bipolar disorder, obstructive sleep apnea, asthma, previous history of DVT/PE in 2016, presents to the hospital with cough, wheezing and shortness of breath.  Found to have asthma exacerbation and started on steroids.  She tested negative for COVID-19 and influenza.  Interestingly, she did test positive for COVID on 12/19 and was treated with a course of molnupiravir.  She is admitted with asthma exacerbation and started on steroids and bronchodilators.  She also complains of bilateral diplopia and left arm tingling.  She is undergoing further work-up with MRI brain.   Assessment & Plan:   Principal Problem:   Severe persistent asthma with exacerbation Active Problems:   Type 2 diabetes mellitus without complication, without long-term current use of insulin (HCC)   OSA (obstructive sleep apnea)   Hypokalemia   Bipolar 1 disorder (HCC)   GERD without esophagitis   Asthma exacerbation -Continues to have shortness of breath and wheezing -Continue on IV steroids -Continue bronchodilators -BNP normal -Chest x-ray without evidence of pneumonia -Procalcitonin is negative -COVID-19 and influenza negative -ABG shows pH is 7.33, PCO2 47, PO2 83 -Reports that she was supposed to be on 3 L of oxygen at home, prescribed by her pulmonologist at [facility].  She discontinued this on her own.  We will likely need to discharge with supplemental oxygen through advanced home care. -change cough medicine to hycodan  Bilateral diplopia -Symptoms appear to be present since last night -She also endorses left arm tingling -Review of prior notes indicate that the symptoms were present on her last ER visit on 12/19 -Discussed with neurology, Dr. Selina CooleyStack, will check MRI brain with and without contrast -MRI brain performed and  did not show any acute findings.  No further inpatient neuro work-up per neurology.  Dizziness -suspect this is positional vertigo, start meclizine -check orthostatics  Elevated blood pressure -use hydralazine prn  Hypokalemia -Replace  Type 2 diabetes -She is on semaglutide at home -Continue on sliding scale insulin -A1c 6.1  Obstructive sleep apnea -Continue on CPAP  Bipolar 1 disorder -Continued on home psychotropics  GERD -Continue on PPI  Morbid obesity -BMI 52.7 -Counseled on importance of diet and exercise   DVT prophylaxis:   Code Status: Full code Family Communication: Discussed with patient Disposition Plan: Status is: Inpatient  Remains inpatient appropriate because: Continued work-up of neurodeficits and further treatment of asthma exacerbation with IV steroids.       Consultants:    Procedures:    Antimicrobials:      Subjective: Feels dizzy on standing, but also says dizziness is worse with changing position of head. Continues to have significant cough which can induce vomiting/dry heaves.  Objective: Vitals:   12/19/21 2020 12/19/21 2026 12/19/21 2027 12/19/21 2032  BP: (!) 169/87 (!) 176/135 (!) 148/102 (!) 139/99  Pulse: 86 94 72 96  Resp:      Temp: 99.7 F (37.6 C)     TempSrc: Oral     SpO2: 99%     Weight:      Height:        Intake/Output Summary (Last 24 hours) at 12/19/2021 2314 Last data filed at 12/19/2021 0351 Gross per 24 hour  Intake 240 ml  Output 300 ml  Net -60 ml   Filed Weights   12/16/21 0202 12/17/21 1326  Weight: 135.2 kg  131.1 kg    Examination:  General exam: Alert, awake, oriented x 3 Respiratory system: Wheezing bilaterally. Respiratory effort normal. Cardiovascular system:RRR. No murmurs, rubs, gallops. Gastrointestinal system: Abdomen is nondistended, soft and nontender. No organomegaly or masses felt. Normal bowel sounds heard. Central nervous system: Alert and oriented. No focal  neurological deficits. Extremities: No C/C/E, +pedal pulses Skin: No rashes, lesions or ulcers Psychiatry: Judgement and insight appear normal. Mood & affect appropriate.      Data Reviewed: I have personally reviewed following labs and imaging studies  CBC: Recent Labs  Lab 12/16/21 0214 12/17/21 0610  WBC 16.5* 14.4*  NEUTROABS 11.4* 11.4*  HGB 10.8* 10.7*  HCT 35.8* 35.6*  MCV 86.9 88.6  PLT 416* 412*   Basic Metabolic Panel: Recent Labs  Lab 12/16/21 0214 12/17/21 0610  NA 140 141  K 3.4* 4.5  CL 107 107  CO2 25 28  GLUCOSE 134* 127*  BUN 10 14  CREATININE 0.75 0.72  CALCIUM 8.6* 9.0  MG  --  2.4  PHOS  --  3.5   GFR: Estimated Creatinine Clearance: 135 mL/min (by C-G formula based on SCr of 0.72 mg/dL). Liver Function Tests: Recent Labs  Lab 12/17/21 0610  AST 15  ALT 16  ALKPHOS 46  BILITOT 0.4  PROT 7.5  ALBUMIN 3.2*   No results for input(s): LIPASE, AMYLASE in the last 168 hours. No results for input(s): AMMONIA in the last 168 hours. Coagulation Profile: No results for input(s): INR, PROTIME in the last 168 hours. Cardiac Enzymes: No results for input(s): CKTOTAL, CKMB, CKMBINDEX, TROPONINI in the last 168 hours. BNP (last 3 results) No results for input(s): PROBNP in the last 8760 hours. HbA1C: Recent Labs    12/17/21 0610  HGBA1C 6.1*   CBG: Recent Labs  Lab 12/19/21 0150 12/19/21 0741 12/19/21 1136 12/19/21 1645 12/19/21 2131  GLUCAP 234* 174* 288* 232* 254*   Lipid Profile: No results for input(s): CHOL, HDL, LDLCALC, TRIG, CHOLHDL, LDLDIRECT in the last 72 hours. Thyroid Function Tests: No results for input(s): TSH, T4TOTAL, FREET4, T3FREE, THYROIDAB in the last 72 hours. Anemia Panel: No results for input(s): VITAMINB12, FOLATE, FERRITIN, TIBC, IRON, RETICCTPCT in the last 72 hours. Sepsis Labs: Recent Labs  Lab 12/16/21 0214 12/16/21 0250  PROCALCITON <0.10  --   LATICACIDVEN  --  1.5    Recent Results (from  the past 240 hour(s))  Resp Panel by RT-PCR (Flu A&B, Covid) Nasopharyngeal Swab     Status: None   Collection Time: 12/16/21  2:21 AM   Specimen: Nasopharyngeal Swab; Nasopharyngeal(NP) swabs in vial transport medium  Result Value Ref Range Status   SARS Coronavirus 2 by RT PCR NEGATIVE NEGATIVE Final    Comment: (NOTE) SARS-CoV-2 target nucleic acids are NOT DETECTED.  The SARS-CoV-2 RNA is generally detectable in upper respiratory specimens during the acute phase of infection. The lowest concentration of SARS-CoV-2 viral copies this assay can detect is 138 copies/mL. A negative result does not preclude SARS-Cov-2 infection and should not be used as the sole basis for treatment or other patient management decisions. A negative result may occur with  improper specimen collection/handling, submission of specimen other than nasopharyngeal swab, presence of viral mutation(s) within the areas targeted by this assay, and inadequate number of viral copies(<138 copies/mL). A negative result must be combined with clinical observations, patient history, and epidemiological information. The expected result is Negative.  Fact Sheet for Patients:  BloggerCourse.com  Fact Sheet for Healthcare Providers:  SeriousBroker.ithttps://www.fda.gov/media/152162/download  This test is no t yet approved or cleared by the [place] FDA and  has been authorized for detection and/or diagnosis of SARS-CoV-2 by FDA under an Emergency Use Authorization (EUA). This EUA will remain  in effect (meaning this test can be used) for the duration of the COVID-19 declaration under Section 564(b)(1) of the Act, 21 U.S.C.section 360bbb-3(b)(1), unless the authorization is terminated  or revoked sooner.       Influenza A by PCR NEGATIVE NEGATIVE Final   Influenza B by PCR NEGATIVE NEGATIVE Final    Comment: (NOTE) The Xpert Xpress SARS-CoV-2/FLU/RSV plus assay is intended as an aid in the diagnosis of  influenza from Nasopharyngeal swab specimens and should not be used as a sole basis for treatment. Nasal washings and aspirates are unacceptable for Xpert Xpress SARS-CoV-2/FLU/RSV testing.  Fact Sheet for Patients: BloggerCourse.comhttps://www.fda.gov/media/152166/download  Fact Sheet for Healthcare Providers: SeriousBroker.ithttps://www.fda.gov/media/152162/download  This test is not yet approved or cleared by the [place] FDA and has been authorized for detection and/or diagnosis of SARS-CoV-2 by FDA under an Emergency Use Authorization (EUA). This EUA will remain in effect (meaning this test can be used) for the duration of the COVID-19 declaration under Section 564(b)(1) of the Act, 21 U.S.C. section 360bbb-3(b)(1), unless the authorization is terminated or revoked.  Performed at [facility], [address] [place], [place] [address]   Culture, blood (routine x 2)     Status: None (Preliminary result)   Collection Time: 12/16/21  2:50 AM   Specimen: BLOOD  Result Value Ref Range Status   Specimen Description   Final    BLOOD RIGHT ANTECUBITAL Performed at [facility], [address] [place], [place] [address]    Special Requests   Final    BOTTLES DRAWN AEROBIC AND ANAEROBIC Blood Culture adequate volume Performed at [facility], [address] [place], [place] [address]    Culture   Final    NO GROWTH 3 DAYS Performed at [facility], [address] [place], [place] [address]    Report Status PENDING  Incomplete  Culture, blood (routine x 2)     Status: None (Preliminary result)   Collection Time: 12/16/21  2:50 AM   Specimen: BLOOD  Result Value Ref Range Status   Specimen Description   Final    BLOOD BLOOD RIGHT FOREARM Performed at [facility], [address] [place], [place] [address]    Special Requests   Final    BOTTLES DRAWN AEROBIC AND ANAEROBIC Blood Culture adequate volume Performed at  [facility], [address] [place], [place] [address]    Culture   Final    NO GROWTH 3 DAYS Performed at [facility], [address] [place], [place] [address]    Report Status PENDING  Incomplete  Respiratory (~20 pathogens) panel by PCR     Status: None   Collection Time: 12/16/21 10:03 AM   Specimen: Nasopharyngeal Swab; Respiratory  Result Value Ref Range Status   Adenovirus NOT DETECTED NOT DETECTED Final   Coronavirus 229E NOT DETECTED NOT DETECTED Final    Comment: (NOTE) The Coronavirus on the Respiratory Panel, DOES NOT test for the novel  Coronavirus (2019 nCoV)    Coronavirus HKU1 NOT DETECTED NOT DETECTED Final   Coronavirus NL63 NOT DETECTED NOT DETECTED Final   Coronavirus OC43 NOT DETECTED NOT DETECTED Final   Metapneumovirus NOT DETECTED NOT DETECTED Final  Rhinovirus / Enterovirus NOT DETECTED NOT DETECTED Final   Influenza A NOT DETECTED NOT DETECTED Final   Influenza B NOT DETECTED NOT DETECTED Final   Parainfluenza Virus 1 NOT DETECTED NOT DETECTED Final   Parainfluenza Virus 2 NOT DETECTED NOT DETECTED Final   Parainfluenza Virus 3 NOT DETECTED NOT DETECTED Final   Parainfluenza Virus 4 NOT DETECTED NOT DETECTED Final   Respiratory Syncytial Virus NOT DETECTED NOT DETECTED Final   Bordetella pertussis NOT DETECTED NOT DETECTED Final   Bordetella Parapertussis NOT DETECTED NOT DETECTED Final   Chlamydophila pneumoniae NOT DETECTED NOT DETECTED Final   Mycoplasma pneumoniae NOT DETECTED NOT DETECTED Final    Comment: Performed at [facility], [address] [place], [place] [address]         Radiology Studies: No results found.      Scheduled Meds:  aspirin EC  81 mg Oral Daily   budesonide (PULMICORT) nebulizer solution  0.25 mg Nebulization BID   enoxaparin (LOVENOX) injection  60 mg Subcutaneous Q24H   gabapentin  300 mg Oral TID   guaiFENesin  600 mg Oral BID   insulin aspart  0-15 Units Subcutaneous  TID AC & HS   insulin aspart  4 Units Subcutaneous TID WC   ipratropium-albuterol  3 mL Nebulization BID   lurasidone  20 mg Oral Daily   meclizine  25 mg Oral TID   [START ON 12/20/2021] predniSONE  40 mg Oral Q breakfast   Continuous Infusions:   LOS: 3 days    Time spent:    Erick Blinks, MD [facility]   If 7PM-7AM, please contact night-coverage [email or web address]  12/19/2021, 11:14 PM

## 2021-12-19 NOTE — Progress Notes (Signed)
Inpatient Diabetes Program Recommendations  AACE/ADA: New Consensus Statement on Inpatient Glycemic Control (2015)  Target Ranges:  Prepandial:   less than 140 mg/dL      Peak postprandial:   less than 180 mg/dL (1-2 hours)      Critically ill patients:  140 - 180 mg/dL   Lab Results  Component Value Date   GLUCAP 174 (H) 12/19/2021   HGBA1C 6.1 (H) 12/17/2021    Review of Glycemic Control  Latest Reference Range & Units 12/18/21 07:32 12/18/21 11:58 12/18/21 17:19 12/18/21 21:07 12/19/21 01:50 12/19/21 07:41  Glucose-Capillary 70 - 99 mg/dL 867 (H) 619 (H) 509 (H)  Novolog 11 units 402 (H)  Novolog 15 units 234 (H) 174 (H)  Novolog 3 units   Asthma exacerbation Diabetes history: DM 2 Outpatient Diabetes medications: Ozempic 0.25 mg weekly Current orders for Inpatient glycemic control:  Novolog 0-15 units 4x/day  A1c 6.1% on 1/4 Solumedrol 60 mg Q12  Inpatient Diabetes Program Recommendations:    -  Add Novolog 4 units tid meal coverage if eating >50% of meals  Thanks,  Christena Deem RN, MSN, BC-ADM Inpatient Diabetes Coordinator Team Pager [phone] (8a-5p)

## 2021-12-20 ENCOUNTER — Inpatient Hospital Stay (HOSPITAL_COMMUNITY): Payer: Medicaid Other

## 2021-12-20 DIAGNOSIS — K219 Gastro-esophageal reflux disease without esophagitis: Secondary | ICD-10-CM | POA: Diagnosis not present

## 2021-12-20 DIAGNOSIS — G4733 Obstructive sleep apnea (adult) (pediatric): Secondary | ICD-10-CM | POA: Diagnosis not present

## 2021-12-20 DIAGNOSIS — J4551 Severe persistent asthma with (acute) exacerbation: Secondary | ICD-10-CM | POA: Diagnosis not present

## 2021-12-20 DIAGNOSIS — F319 Bipolar disorder, unspecified: Secondary | ICD-10-CM | POA: Diagnosis not present

## 2021-12-20 LAB — COMPREHENSIVE METABOLIC PANEL
ALT: 15 U/L (ref 0–44)
AST: 15 U/L (ref 15–41)
Albumin: 3.2 g/dL — ABNORMAL LOW (ref 3.5–5.0)
Alkaline Phosphatase: 60 U/L (ref 38–126)
Anion gap: 9 (ref 5–15)
BUN: 17 mg/dL (ref 6–20)
CO2: 30 mmol/L (ref 22–32)
Calcium: 8.8 mg/dL — ABNORMAL LOW (ref 8.9–10.3)
Chloride: 100 mmol/L (ref 98–111)
Creatinine, Ser: 0.77 mg/dL (ref 0.44–1.00)
GFR, Estimated: >60 mL/min (ref >60)
Glucose, Bld: 167 mg/dL — ABNORMAL HIGH (ref 70–99)
Potassium: 4 mmol/L (ref 3.5–5.1)
Sodium: 139 mmol/L (ref 135–145)
Total Bilirubin: 0.3 mg/dL (ref 0.3–1.2)
Total Protein: 6.9 g/dL (ref 6.5–8.1)

## 2021-12-20 LAB — GLUCOSE, CAPILLARY
Glucose-Capillary: 150 mg/dL — ABNORMAL HIGH (ref 70–99)
Glucose-Capillary: 99 mg/dL (ref 70–99)
Glucose-Capillary: 181 mg/dL — ABNORMAL HIGH (ref 70–99)
Glucose-Capillary: 200 mg/dL — ABNORMAL HIGH (ref 70–99)

## 2021-12-20 LAB — BLOOD GAS, ARTERIAL
Acid-Base Excess: 5.2 mmol/L — ABNORMAL HIGH (ref 0.0–2.0)
Bicarbonate: 31.8 mmol/L — ABNORMAL HIGH (ref 20.0–28.0)
O2 Saturation: 99.2 %
Patient temperature: 98.6
pCO2 arterial: 59.8 mmHg — ABNORMAL HIGH (ref 32.0–48.0)
pH, Arterial: 7.345 — ABNORMAL LOW (ref 7.350–7.450)
pO2, Arterial: 142 mmHg — ABNORMAL HIGH (ref 83.0–108.0)

## 2021-12-20 LAB — CBC
HCT: 38.3 % (ref 36.0–46.0)
Hemoglobin: 11.3 g/dL — ABNORMAL LOW (ref 12.0–15.0)
MCH: 26.6 pg (ref 26.0–34.0)
MCHC: 29.5 g/dL — ABNORMAL LOW (ref 30.0–36.0)
MCV: 90.1 fL (ref 80.0–100.0)
Platelets: 344 10*3/uL (ref 150–400)
RBC: 4.25 MIL/uL (ref 3.87–5.11)
RDW: 15.8 % — ABNORMAL HIGH (ref 11.5–15.5)
WBC: 13 10*3/uL — ABNORMAL HIGH (ref 4.0–10.5)
nRBC: 0 % (ref 0.0–0.2)

## 2021-12-20 IMAGING — CT CT CHEST W/O CM
2 of 4 series · 15 of 36 positions shown, 18 images · non-contrast
Comparison: Chest radiograph, [DATE] and older exams.

CLINICAL DATA: Short of breath.

EXAM:
CT CHEST WITHOUT CONTRAST
TECHNIQUE: Multidetector CT imaging of the chest was performed following the
standard protocol without IV contrast.

[Series 2: thorax · axial · 0.70mm/px · z∈[-198,+6]mm · 12 of 122 slices shown, 15 images]
[im 10/122  mediastinal]
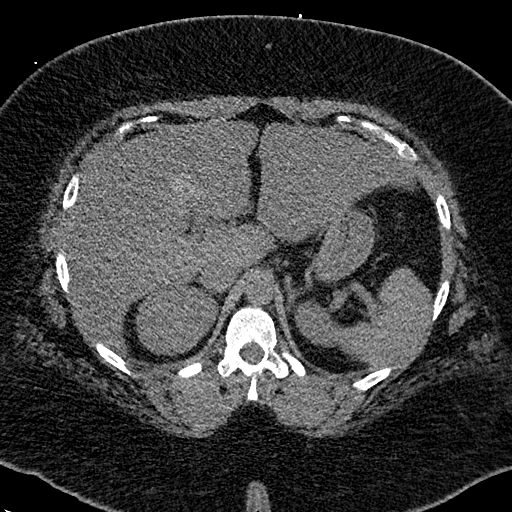
[im 10/122  lung]
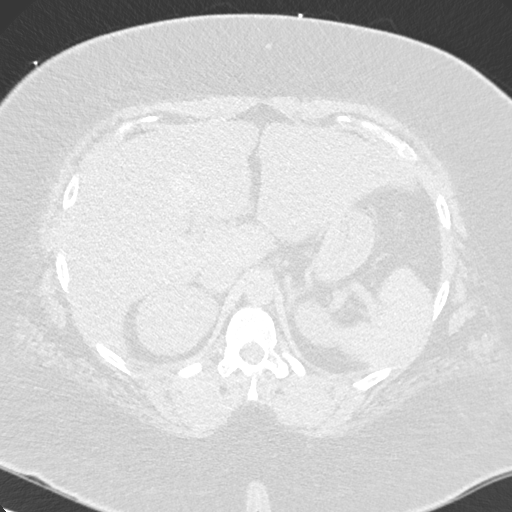
[im 19/122  lung]
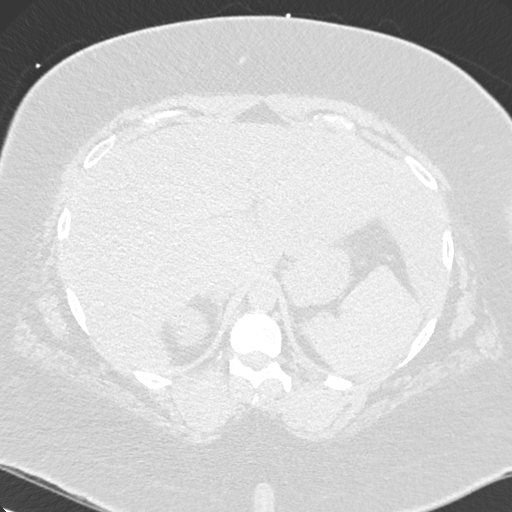
[im 28/122  lung]
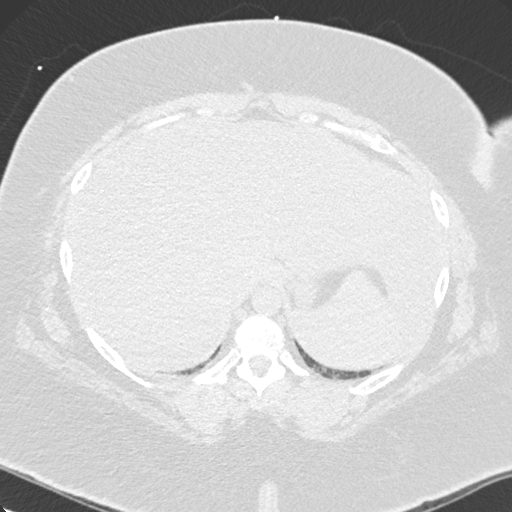
[im 38/122  lung]
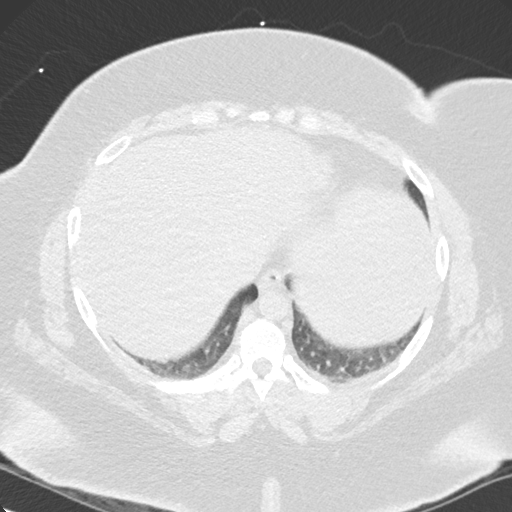
[im 47/122  mediastinal]
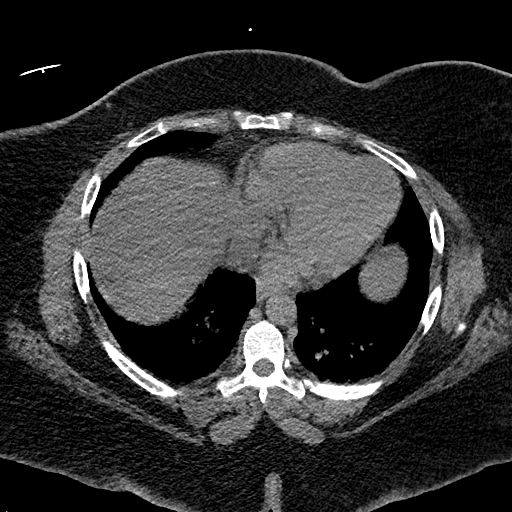
[im 47/122  lung]
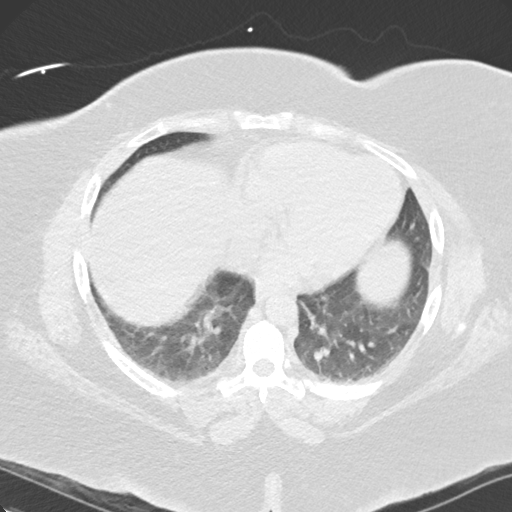
[im 56/122  lung]
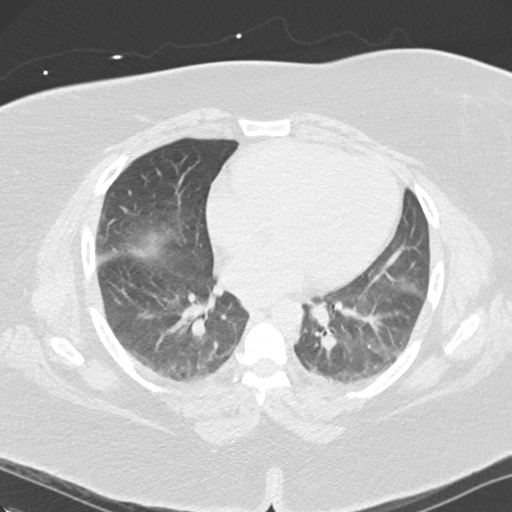
[im 66/122  lung]
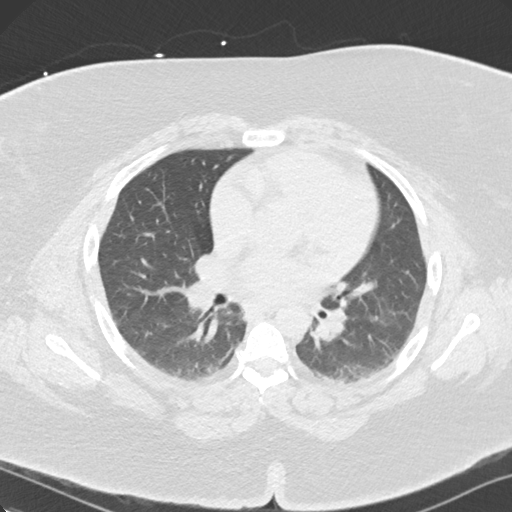
[im 75/122  lung]
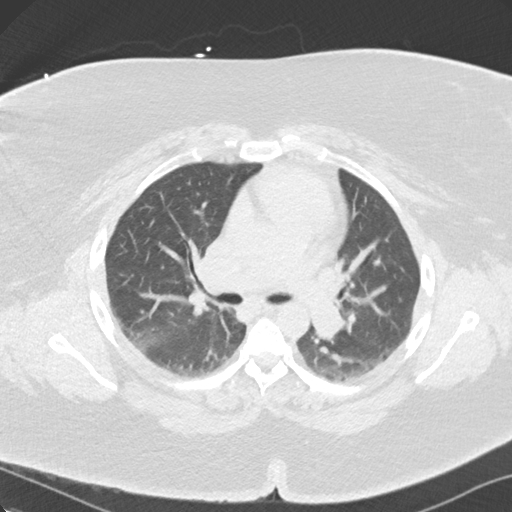
[im 84/122  mediastinal]
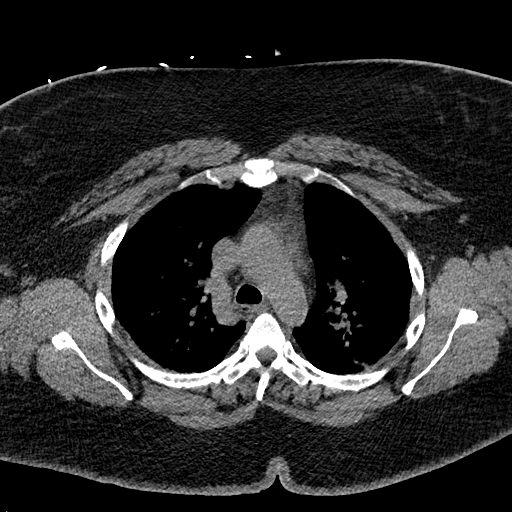
[im 84/122  lung]
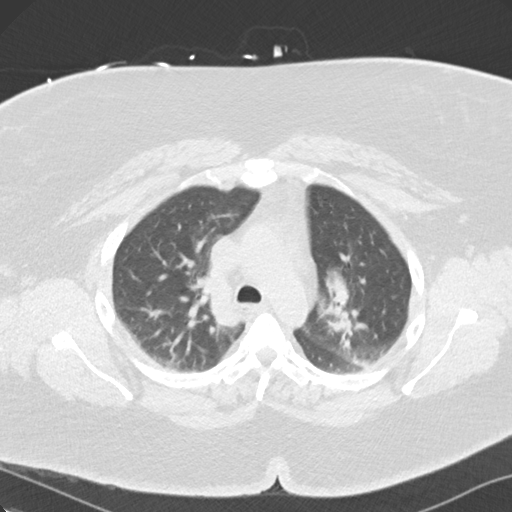
[im 94/122  lung]
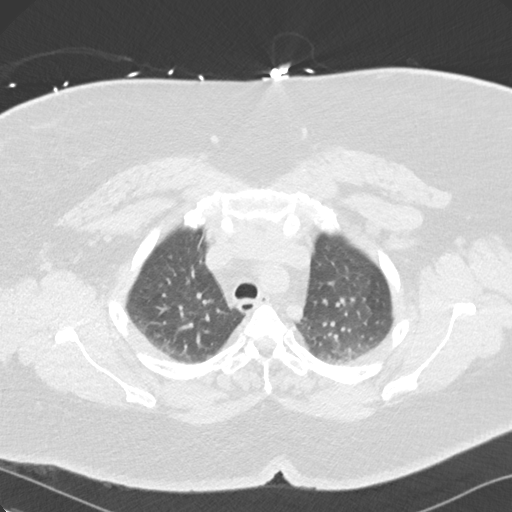
[im 103/122  lung]
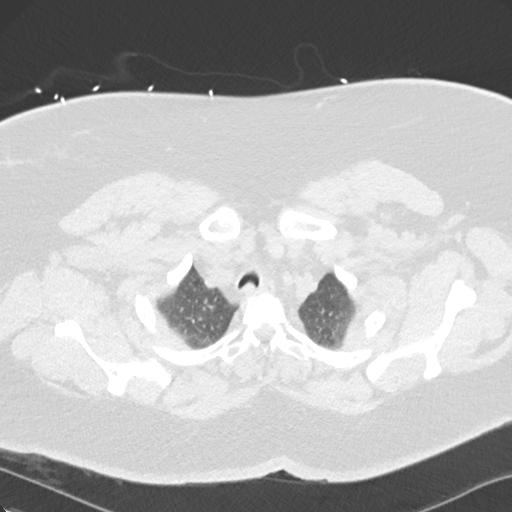
[im 112/122  lung]
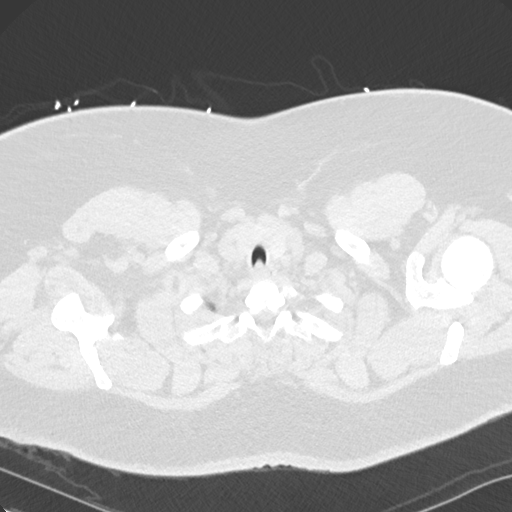

[Series 4: coronal · coronal · 0.47mm/px · 3 of 136 slices shown]
[im 28/136  lung]
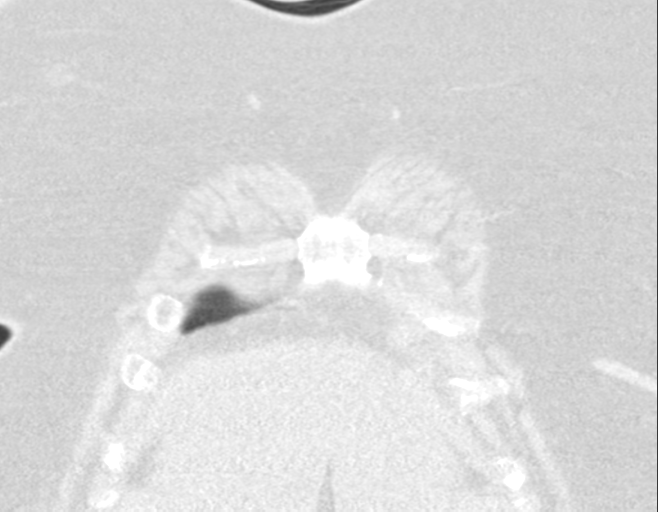
[im 55/136  lung]
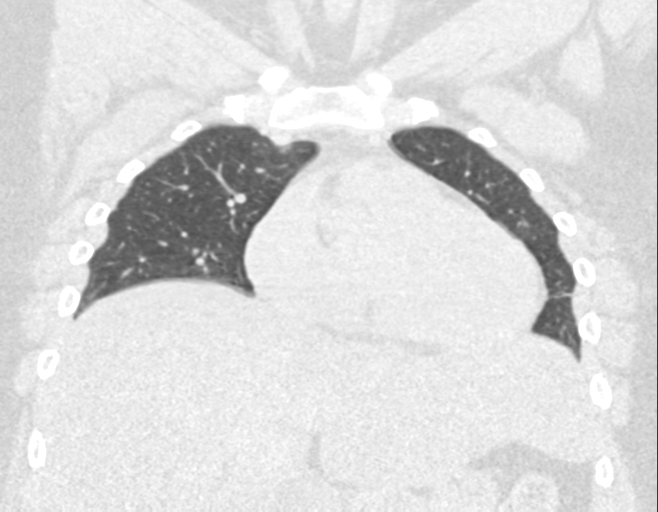
[im 82/136  lung]
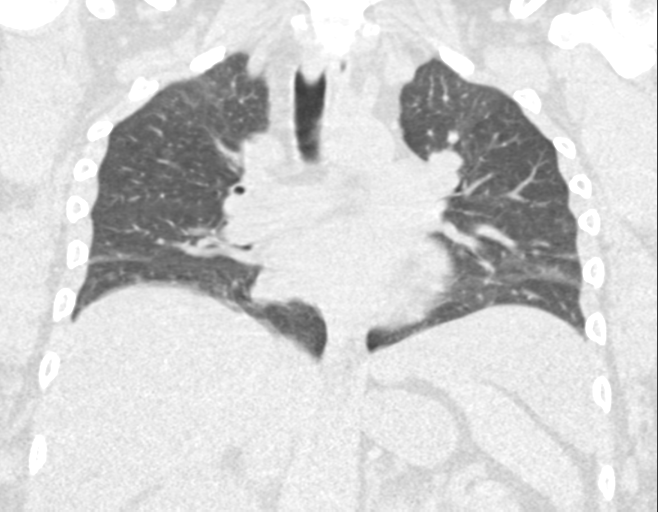

[15 of 36 positions shown; findings below may reference images not displayed]

FINDINGS: Cardiovascular: Heart normal in size and configuration. No
pericardial effusion. Great vessels are within normal limits.

Mediastinum/Nodes: No enlarged mediastinal or axillary lymph nodes.
Thyroid gland, trachea, and esophagus demonstrate no significant
findings.

Lungs/Pleura: Expiratory exam with low lung volumes. Mild dependent
linear and ground-glass opacities consistent with atelectasis. Lungs
otherwise clear. No pleural effusion or pneumothorax.

Upper Abdomen: Liver mildly enlarged with diffuse decreased
attenuation consistent with fatty infiltration. No acute findings in
the visualized upper abdomen.

Musculoskeletal: No chest wall mass or suspicious bone lesions
identified.
IMPRESSION: 1. No acute findings.
2. Lungs demonstrate mild dependent atelectasis accentuated by the
low lung volumes. No evidence of pneumonia or pulmonary edema.
3. Hepatic steatosis and mild hepatomegaly.

## 2021-12-20 MED ORDER — PANTOPRAZOLE SODIUM 40 MG IV SOLR
40.0000 mg | Freq: Two times a day (BID) | INTRAVENOUS | Status: DC
  Administered 2021-12-20 (×2): 40 mg via INTRAVENOUS
  Filled 2021-12-20 (×3): qty 40

## 2021-12-20 MED ORDER — ALUM & MAG HYDROXIDE-SIMETH 200-200-20 MG/5ML PO SUSP
30.0000 mL | Freq: Once | ORAL | Status: AC
  Administered 2021-12-20: 30 mL via ORAL
  Filled 2021-12-20: qty 30

## 2021-12-20 MED ORDER — LIDOCAINE VISCOUS HCL 2 % MT SOLN
15.0000 mL | Freq: Once | OROMUCOSAL | Status: AC
  Administered 2021-12-20: 15 mL via ORAL
  Filled 2021-12-20: qty 15

## 2021-12-20 MED ORDER — MORPHINE SULFATE (PF) 2 MG/ML IV SOLN
1.0000 mg | Freq: Once | INTRAVENOUS | Status: AC
  Administered 2021-12-20: 1 mg via INTRAVENOUS
  Filled 2021-12-20: qty 1

## 2021-12-20 NOTE — Progress Notes (Signed)
PROGRESS NOTE    Felicia Bradley  ZOX:[id number] DOB: 05-15-1991 DOA: 12/16/2021 PCP: [facility], [facility]    Brief Narrative:  31 year old female with a history of diabetes, bipolar disorder, obstructive sleep apnea, asthma, previous history of DVT/PE in 2016, presents to the hospital with cough, wheezing and shortness of breath.  Found to have asthma exacerbation and started on steroids.  She tested negative for COVID-19 and influenza.  Interestingly, she did test positive for COVID on 12/19 and was treated with a course of molnupiravir.  She is admitted with asthma exacerbation and started on steroids and bronchodilators.  She also complains of bilateral diplopia and left arm tingling.  She is undergoing further work-up with MRI brain.   Assessment & Plan:   Principal Problem:   Severe persistent asthma with exacerbation Active Problems:   Type 2 diabetes mellitus without complication, without long-term current use of insulin (HCC)   OSA (obstructive sleep apnea)   Hypokalemia   Bipolar 1 disorder (HCC)   GERD without esophagitis   Asthma exacerbation -Continues to have shortness of breath and wheezing -She was started on IV steroids, since her wheezing has improved, she is being transitioned to prednisone -Continue bronchodilators -BNP normal -Chest x-ray without evidence of pneumonia -Procalcitonin is negative -COVID-19 and influenza negative -ABG shows pH is 7.33, PCO2 47, PO2 83 -Reports that she was supposed to be on 3 L of oxygen at home, prescribed by [facility] pulmonologist at [facility].  She discontinued this on her own.  We will likely need to discharge with supplemental oxygen through advanced home care. -Due to persistent cough, will check CT chest for any developing pneumonia -Started on Protonix for any element of GERD contributing cough  Metabolic encephalopathy -Increasingly somnolent today -She did not wear her CPAP overnight due to cough -She did receive  morphine overnight, is already on oxycodone and has received Hycodan for cough -ABG was checked and PCO2 noted to be rising from 47 on admission to 59 today -Hycodan has been discontinued -Patient was placed on CPAP when she sleeps and has been advised to use CPAP at night as well -Lethargy reportedly improved after several hours on CPAP  Bilateral diplopia -Symptoms appear to be present since last night -She also endorses left arm tingling -Review of prior notes indicate that the symptoms were present on her last ER visit on 12/19 -Discussed with neurology, Dr. Selina CooleyStack, will check MRI brain with and without contrast -MRI brain performed and did not show any acute findings.  No further inpatient neuro work-up per neurology.  Dizziness -suspect this is positional vertigo, start meclizine -Orthostatics negative  Elevated blood pressure -use hydralazine prn  Hypokalemia -Replace  Type 2 diabetes -She is on semaglutide at home -Continue on sliding scale insulin -A1c 6.1  Obstructive sleep apnea -Continue on CPAP  Bipolar 1 disorder -Continued on home psychotropics  GERD -Continue on PPI  Morbid obesity -BMI 52.7 -Counseled on importance of diet and exercise   DVT prophylaxis:   Lovenox  Code Status: Full code Family Communication: Discussed with patient Disposition Plan: Status is: Inpatient  Remains inpatient appropriate because: Continued work-up of neurodeficits and further treatment of asthma exacerbation with IV steroids.       Consultants:    Procedures:    Antimicrobials:      Subjective: Reports her dizziness is mildly better with meclizine.  Continues to have cough and associated chest pain with cough.  Still feels short of breath on exertion.  She has  been increasingly somnolent today.  Objective: Vitals:   12/20/21 0538 12/20/21 0749 12/20/21 0752 12/20/21 2007  BP: (!) 133/111 (!) 146/82  (!) 152/101  Pulse: (!) 107 86  92  Resp: 20 18   20   Temp: 99.4 F (37.4 C) 98 F (36.7 C)  98.5 F (36.9 C)  TempSrc: Oral Oral  Oral  SpO2: 100%  99% 97%  Weight:      Height:        Intake/Output Summary (Last 24 hours) at 12/20/2021 2057 Last data filed at 12/20/2021 2057 Gross per 24 hour  Intake --  Output 1000 ml  Net -1000 ml   Filed Weights   12/16/21 0202 12/17/21 1326  Weight: 135.2 kg 131.1 kg    Examination:  General exam: Somnolent, but does wake up to voice Respiratory system: Clear to auscultation. Respiratory effort normal. Cardiovascular system:RRR. No murmurs, rubs, gallops. Gastrointestinal system: Abdomen is nondistended, soft and nontender. No organomegaly or masses felt. Normal bowel sounds heard. Central nervous system:  No focal neurological deficits. Extremities: No C/C/E, +pedal pulses Skin: No rashes, lesions or ulcers Psychiatry: Judgement and insight appear normal. Mood & affect appropriate.      Data Reviewed: I have personally reviewed following labs and imaging studies  CBC: Recent Labs  Lab 12/16/21 0214 12/17/21 0610 12/20/21 0547  WBC 16.5* 14.4* 13.0*  NEUTROABS 11.4* 11.4*  --   HGB 10.8* 10.7* 11.3*  HCT 35.8* 35.6* 38.3  MCV 86.9 88.6 90.1  PLT 416* 412* 344   Basic Metabolic Panel: Recent Labs  Lab 12/16/21 0214 12/17/21 0610 12/20/21 0547  NA 140 141 139  K 3.4* 4.5 4.0  CL 107 107 100  CO2 25 28 30   GLUCOSE 134* 127* 167*  BUN 10 14 17   CREATININE 0.75 0.72 0.77  CALCIUM 8.6* 9.0 8.8*  MG  --  2.4  --   PHOS  --  3.5  --    GFR: Estimated Creatinine Clearance: 135 mL/min (by C-G formula based on SCr of 0.77 mg/dL). Liver Function Tests: Recent Labs  Lab 12/17/21 0610 12/20/21 0547  AST 15 15  ALT 16 15  ALKPHOS 46 60  BILITOT 0.4 0.3  PROT 7.5 6.9  ALBUMIN 3.2* 3.2*   No results for input(s): LIPASE, AMYLASE in the last 168 hours. No results for input(s): AMMONIA in the last 168 hours. Coagulation Profile: No results for input(s): INR,  PROTIME in the last 168 hours. Cardiac Enzymes: No results for input(s): CKTOTAL, CKMB, CKMBINDEX, TROPONINI in the last 168 hours. BNP (last 3 results) No results for input(s): PROBNP in the last 8760 hours. HbA1C: No results for input(s): HGBA1C in the last 72 hours.  CBG: Recent Labs  Lab 12/19/21 1645 12/19/21 2131 12/20/21 0741 12/20/21 1146 12/20/21 1652  GLUCAP 232* 254* 150* 99 181*   Lipid Profile: No results for input(s): CHOL, HDL, LDLCALC, TRIG, CHOLHDL, LDLDIRECT in the last 72 hours. Thyroid Function Tests: No results for input(s): TSH, T4TOTAL, FREET4, T3FREE, THYROIDAB in the last 72 hours. Anemia Panel: No results for input(s): VITAMINB12, FOLATE, FERRITIN, TIBC, IRON, RETICCTPCT in the last 72 hours. Sepsis Labs: Recent Labs  Lab 12/16/21 0214 12/16/21 0250  PROCALCITON <0.10  --   LATICACIDVEN  --  1.5    Recent Results (from the past 240 hour(s))  Resp Panel by RT-PCR (Flu A&B, Covid) Nasopharyngeal Swab     Status: None   Collection Time: 12/16/21  2:21 AM   Specimen: Nasopharyngeal Swab;  Nasopharyngeal(NP) swabs in vial transport medium  Result Value Ref Range Status   SARS Coronavirus 2 by RT PCR NEGATIVE NEGATIVE Final    Comment: (NOTE) SARS-CoV-2 target nucleic acids are NOT DETECTED.  The SARS-CoV-2 RNA is generally detectable in upper respiratory specimens during the acute phase of infection. The lowest concentration of SARS-CoV-2 viral copies this assay can detect is 138 copies/mL. A negative result does not preclude SARS-Cov-2 infection and should not be used as the sole basis for treatment or other patient management decisions. A negative result may occur with  improper specimen collection/handling, submission of specimen other than nasopharyngeal swab, presence of viral mutation(s) within the areas targeted by this assay, and inadequate number of viral copies(<138 copies/mL). A negative result must be combined with clinical  observations, patient history, and epidemiological information. The expected result is Negative.  Fact Sheet for Patients:  BloggerCourse.com  Fact Sheet for Healthcare Providers:  SeriousBroker.it  This test is no t yet approved or cleared by the [place] FDA and  has been authorized for detection and/or diagnosis of SARS-CoV-2 by FDA under an Emergency Use Authorization (EUA). This EUA will remain  in effect (meaning this test can be used) for the duration of the COVID-19 declaration under Section 564(b)(1) of the Act, 21 U.S.C.section 360bbb-3(b)(1), unless the authorization is terminated  or revoked sooner.       Influenza A by PCR NEGATIVE NEGATIVE Final   Influenza B by PCR NEGATIVE NEGATIVE Final    Comment: (NOTE) The Xpert Xpress SARS-CoV-2/FLU/RSV plus assay is intended as an aid in the diagnosis of influenza from Nasopharyngeal swab specimens and should not be used as a sole basis for treatment. Nasal washings and aspirates are unacceptable for Xpert Xpress SARS-CoV-2/FLU/RSV testing.  Fact Sheet for Patients: BloggerCourse.com  Fact Sheet for Healthcare Providers: SeriousBroker.it  This test is not yet approved or cleared by the [place] FDA and has been authorized for detection and/or diagnosis of SARS-CoV-2 by FDA under an Emergency Use Authorization (EUA). This EUA will remain in effect (meaning this test can be used) for the duration of the COVID-19 declaration under Section 564(b)(1) of the Act, 21 U.S.C. section 360bbb-3(b)(1), unless the authorization is terminated or revoked.  Performed at [facility], [address] [place], [place] [address]   Culture, blood (routine x 2)     Status: None (Preliminary result)   Collection Time: 12/16/21  2:50 AM   Specimen: BLOOD  Result Value Ref Range Status   Specimen Description    Final    BLOOD RIGHT ANTECUBITAL Performed at [facility], [address], [place], [place] [address]    Special Requests   Final    BOTTLES DRAWN AEROBIC AND ANAEROBIC Blood Culture adequate volume Performed at [facility], [address] [place], [place] [address]    Culture   Final    NO GROWTH 4 DAYS Performed at [facility], [address] [place], [place] [address]    Report Status PENDING  Incomplete  Culture, blood (routine x 2)     Status: None (Preliminary result)   Collection Time: 12/16/21  2:50 AM   Specimen: BLOOD  Result Value Ref Range Status   Specimen Description   Final    BLOOD BLOOD RIGHT FOREARM Performed at [facility], [address] [place], [place] [address]    Special Requests   Final    BOTTLES DRAWN AEROBIC AND ANAEROBIC Blood  Culture adequate volume Performed at [facility], [address] [place], [place] [address]    Culture   Final    NO GROWTH 4 DAYS Performed at [facility], [address] [place], [place] [address]    Report Status PENDING  Incomplete  Respiratory (~20 pathogens) panel by PCR     Status: None   Collection Time: 12/16/21 10:03 AM   Specimen: Nasopharyngeal Swab; Respiratory  Result Value Ref Range Status   Adenovirus NOT DETECTED NOT DETECTED Final   Coronavirus 229E NOT DETECTED NOT DETECTED Final    Comment: (NOTE) The Coronavirus on the Respiratory Panel, DOES NOT test for the novel  Coronavirus (2019 nCoV)    Coronavirus HKU1 NOT DETECTED NOT DETECTED Final   Coronavirus NL63 NOT DETECTED NOT DETECTED Final   Coronavirus OC43 NOT DETECTED NOT DETECTED Final   Metapneumovirus NOT DETECTED NOT DETECTED Final   Rhinovirus / Enterovirus NOT DETECTED NOT DETECTED Final   Influenza A NOT DETECTED NOT DETECTED Final   Influenza B NOT DETECTED NOT DETECTED Final   Parainfluenza Virus 1 NOT DETECTED NOT DETECTED Final    Parainfluenza Virus 2 NOT DETECTED NOT DETECTED Final   Parainfluenza Virus 3 NOT DETECTED NOT DETECTED Final   Parainfluenza Virus 4 NOT DETECTED NOT DETECTED Final   Respiratory Syncytial Virus NOT DETECTED NOT DETECTED Final   Bordetella pertussis NOT DETECTED NOT DETECTED Final   Bordetella Parapertussis NOT DETECTED NOT DETECTED Final   Chlamydophila pneumoniae NOT DETECTED NOT DETECTED Final   Mycoplasma pneumoniae NOT DETECTED NOT DETECTED Final    Comment: Performed at [facility], [address] [place], [place] [address]         Radiology Studies: CT CHEST WO CONTRAST  Result Date: 12/20/2021 CLINICAL DATA:  Short of breath. EXAM: CT CHEST WITHOUT CONTRAST TECHNIQUE: Multidetector CT imaging of the chest was performed following the standard protocol without IV contrast. COMPARISON:  Chest radiograph, 12/16/2021 and older exams. FINDINGS: Cardiovascular: Heart normal in size and configuration. No pericardial effusion. Great vessels are within normal limits. Mediastinum/Nodes: No enlarged mediastinal or axillary lymph nodes. Thyroid gland, trachea, and esophagus demonstrate no significant findings. Lungs/Pleura: Expiratory exam with low lung volumes. Mild dependent linear and ground-glass opacities consistent with atelectasis. Lungs otherwise clear. No pleural effusion or pneumothorax. Upper Abdomen: Liver mildly enlarged with diffuse decreased attenuation consistent with fatty infiltration. No acute findings in the visualized upper abdomen. Musculoskeletal: No chest wall mass or suspicious bone lesions identified. IMPRESSION: 1. No acute findings. 2. Lungs demonstrate mild dependent atelectasis accentuated by the low lung volumes. No evidence of pneumonia or pulmonary edema. 3. Hepatic steatosis and mild hepatomegaly. Electronically Signed   By: Amie Portland M.D.   On: 12/20/2021 15:19        Scheduled Meds:  aspirin EC  81 mg Oral Daily   budesonide (PULMICORT) nebulizer  solution  0.25 mg Nebulization BID   enoxaparin (LOVENOX) injection  60 mg Subcutaneous Q24H   gabapentin  300 mg Oral TID   guaiFENesin  600 mg Oral BID   insulin aspart  0-15 Units Subcutaneous TID AC & HS   ipratropium-albuterol  3 mL Nebulization BID   lurasidone  20 mg Oral Daily   meclizine  25 mg Oral TID   pantoprazole (PROTONIX) IV  40 mg Intravenous Q12H   predniSONE  40 mg Oral Q breakfast   Continuous Infusions:   LOS: 4 days    Time spent:  Erick BlinksJehanzeb Ihsan Nomura, MD [facility]   If 7PM-7AM, please contact night-coverage [email or web address]  12/20/2021, 8:57 PM

## 2021-12-20 NOTE — Progress Notes (Signed)
Pt continues to c/o pain, mid chest, 10/10.  RN has given PRN medications when available but minimal relief.  On call paged, new order for 1x morphine, see MAR.

## 2021-12-20 NOTE — Progress Notes (Signed)
Pt states she will call when ready for CPAP QHS.  

## 2021-12-21 DIAGNOSIS — J4551 Severe persistent asthma with (acute) exacerbation: Secondary | ICD-10-CM | POA: Diagnosis not present

## 2021-12-21 DIAGNOSIS — F319 Bipolar disorder, unspecified: Secondary | ICD-10-CM | POA: Diagnosis not present

## 2021-12-21 DIAGNOSIS — E876 Hypokalemia: Secondary | ICD-10-CM | POA: Diagnosis not present

## 2021-12-21 DIAGNOSIS — K219 Gastro-esophageal reflux disease without esophagitis: Secondary | ICD-10-CM | POA: Diagnosis not present

## 2021-12-21 LAB — CULTURE, BLOOD (ROUTINE X 2)
Culture: NO GROWTH
Culture: NO GROWTH
Special Requests: ADEQUATE
Special Requests: ADEQUATE

## 2021-12-21 LAB — GLUCOSE, CAPILLARY: Glucose-Capillary: 109 mg/dL — ABNORMAL HIGH (ref 70–99)

## 2021-12-21 MED ORDER — PANTOPRAZOLE SODIUM 40 MG PO TBEC: 40.0000 mg | DELAYED_RELEASE_TABLET | Freq: Every day | ORAL | 0 refills | Status: AC

## 2021-12-21 MED ORDER — PANTOPRAZOLE SODIUM 40 MG PO TBEC: 40.0000 mg | DELAYED_RELEASE_TABLET | Freq: Two times a day (BID) | ORAL | Status: DC

## 2021-12-21 MED ORDER — BENZONATATE 100 MG PO CAPS: 200.0000 mg | ORAL_CAPSULE | Freq: Two times a day (BID) | ORAL | Status: DC | PRN

## 2021-12-21 MED ORDER — GUAIFENESIN 100 MG/5ML PO LIQD
5.0000 mL | ORAL | Status: DC | PRN
  Administered 2021-12-21 (×2): 5 mL via ORAL
  Filled 2021-12-21 (×2): qty 10

## 2021-12-21 MED ORDER — ALPRAZOLAM 0.5 MG PO TABS: 0.5000 mg | ORAL_TABLET | Freq: Three times a day (TID) | ORAL | 0 refills | Status: DC | PRN

## 2021-12-21 MED ORDER — PREDNISONE 20 MG PO TABS: 40.0000 mg | ORAL_TABLET | Freq: Every day | ORAL | 0 refills | Status: DC

## 2021-12-21 NOTE — Discharge Summary (Signed)
Physician Discharge Summary  Felicia Bradley ZOX:[id number] DOB: Nov 28, 1991 DOA: 12/16/2021  PCP: [facility]  Admit date: 12/16/2021 Discharge date: 12/21/2021  Admitted From: Home Disposition: Home  Recommendations for Outpatient Follow-up:  Patient left the hospital without receiving discharge paperwork.  She did not wish to discuss further discharge plan/medications.  When explained this was important to her discharge process and outpatient care, she insisted on leaving.  Discharge Condition: Stable CODE STATUS: Full code Diet recommendation: Heart healthy, carb modified  Brief/Interim Summary: 31 year old female with a history of diabetes, morbid obesity, obstructive sleep apnea, asthma, bipolar disorder, presented to the hospital with cough, wheezing and shortness of breath.  She was initially felt to have asthma exacerbation and started on steroids.  She tested negative for COVID-19 and influenza.  Discharge Diagnoses:  Principal Problem:   Severe persistent asthma with exacerbation Active Problems:   Type 2 diabetes mellitus without complication, without long-term current use of insulin (HCC)   OSA (obstructive sleep apnea)   Hypokalemia   Bipolar 1 disorder (HCC)   GERD without esophagitis  Asthma exacerbation -Continues to have shortness of breath and wheezing -She was started on IV steroids, since her wheezing has improved, she is being transitioned to prednisone -Continue bronchodilators -BNP normal -Chest x-ray without evidence of pneumonia -Procalcitonin is negative -COVID-19 and influenza negative -ABG shows pH is 7.33, PCO2 47, PO2 83 -Reports that she was supposed to be on 3 L of oxygen at home, prescribed by her pulmonologist at [facility].  She discontinued this on her own.  She was monitored on room air in the hospital, at rest and on exertion and was not noted to be hypoxic. -Due to persistent cough, CT chest was checked and did not show any  acute findings -Started on Protonix for any element of GERD contributing cough   Metabolic encephalopathy -She was noted to be increasingly somnolent during her hospital stay -She was not wearing her CPAP overnight -She is chronically on oxycodone for pain and was also receiving Hycodan for cough -ABG was checked and PCO2 noted to be rising from 47 on admission to 59 today -She was noted to be increasingly somnolent during the day and fell asleep during conversation -Her Hycodan was subsequently discontinued and she was placed on CPAP during the day -Lethargy reportedly improved after several hours on CPAP -Unfortunately, she continued to refuse her CPAP overnight -It was explained to her that her sleep apnea and likely obesity hypoventilation will be exacerbated by continued opiate use, making using CPAP increasingly important   Bilateral diplopia -Symptoms appear to be present since last night -She also endorses left arm tingling -Review of prior notes indicate that the symptoms were present on her last ER visit on 12/19 -Discussed with neurology, Dr. Selina Cooley, will check MRI brain with and without contrast -MRI brain performed and did not show any acute findings.  No further inpatient neuro work-up per neurology.   Dizziness -suspect this is positional vertigo, start meclizine -Orthostatics negative -Patient did not answer when asked if she noticed any improvement with meclizine.   Elevated blood pressure -use hydralazine prn   Hypokalemia -Replace   Type 2 diabetes -She is on semaglutide at home -Continue on sliding scale insulin -A1c 6.1   Obstructive sleep apnea -Patient was started on CPAP nightly here in the hospital -Unfortunately, she was noncompliant with this -She reports that she does not have a CPAP machine at home, although she has had several sleep studies in the  past that have shown she has sleep apnea -She has another sleep study scheduled for 1/19 -She has  been advised to keep this appointment   Bipolar 1 disorder -Continued on home psychotropics   GERD -Continue on PPI   Morbid obesity -BMI 52.7 -Counseled on importance of diet and exercise  Noncompliance -Patient did not wear CPAP during the night, even though the risk of hypercapnia and concurrent opiate use were explained. -Several staff members reported that she was routinely verbally abusive towards them -On the day of discharge, patient insisted on being discharged since she did not feel that she was making any progress or that anything was being done for her -Her care was explained in detail and why certain tests and medications were ordered/discontinued. -Patient did not engage at that point and repeatedly asked to be discharged  Discharge Instructions   Allergies as of 12/21/2021       Reactions   Contrast Media [iodinated Contrast Media] Anaphylaxis   Dilaudid [hydromorphone] Anaphylaxis   Tramadol Itching   Apple Hives   Banana Hives   Coconut Oil Hives, Itching   Dexamethasone Nausea And Vomiting, Hives   "feels like my pubic hair is on fire"   Other Hives   Zucchini and squash   Solu-medrol [methylprednisolone] Nausea And Vomiting   Toradol [ketorolac Tromethamine] Hives, Itching        Medication List     STOP taking these medications    oxyCODONE-acetaminophen 10-325 MG tablet Commonly known as: PERCOCET       TAKE these medications    albuterol 108 (90 Base) MCG/ACT inhaler Commonly known as: VENTOLIN HFA Inhale 1 puff into the lungs 2 (two) times daily as needed for wheezing or shortness of breath.   ALPRAZolam 0.5 MG tablet Commonly known as: XANAX Take 1 tablet (0.5 mg total) by mouth 3 (three) times daily as needed for anxiety. What changed:  when to take this reasons to take this   ARIPiprazole 2 MG tablet Commonly known as: ABILIFY Take 2 mg by mouth daily.   aspirin EC 81 MG tablet Take 81 mg by mouth daily. Swallow whole.    celecoxib 100 MG capsule Commonly known as: CELEBREX Take 100 mg by mouth 2 (two) times daily as needed for pain.   diclofenac Sodium 1 % Gel Commonly known as: VOLTAREN Apply 2 g topically 4 (four) times daily. What changed:  when to take this reasons to take this   dicyclomine 20 MG tablet Commonly known as: BENTYL Take 1 tablet (20 mg total) by mouth 3 (three) times daily as needed for spasms.   EPINEPHrine 0.3 mg/0.3 mL Soaj injection Commonly known as: EPI-PEN Inject 0.3 mg into the muscle as needed for anaphylaxis.   fluticasone-salmeterol 250-50 MCG/ACT Aepb Commonly known as: ADVAIR Inhale 1 puff into the lungs in the morning and at bedtime.   gabapentin 300 MG capsule Commonly known as: NEURONTIN Take 300 mg by mouth 3 (three) times daily.   ipratropium-albuterol 0.5-2.5 (3) MG/3ML Soln Commonly known as: DUONEB Take 3 mLs by nebulization in the morning, at noon, and at bedtime.   Latuda 20 MG Tabs tablet Generic drug: lurasidone Take 20 mg by mouth daily.   loperamide 2 MG capsule Commonly known as: IMODIUM Take 1 capsule (2 mg total) by mouth 4 (four) times daily as needed for diarrhea or loose stools.   multivitamin with minerals Tabs tablet Take 1 tablet by mouth daily.   pantoprazole 40 MG tablet Commonly known  as: PROTONIX Take 1 tablet (40 mg total) by mouth daily.   predniSONE 20 MG tablet Commonly known as: DELTASONE Take 2 tablets (40 mg total) by mouth daily with breakfast. Start taking on: December 22, 2021   Semaglutide(0.25 or 0.5MG /DOS) 2 MG/1.5ML Sopn Inject 0.25 mg into the skin once a week. Thursday's   Vitamin D (Ergocalciferol) 1.25 MG (50000 UNIT) Caps capsule Commonly known as: DRISDOL Take 50,000 Units by mouth once a week. Thursday's        Allergies  Allergen Reactions   Contrast Media [Iodinated Contrast Media] Anaphylaxis   Dilaudid [Hydromorphone] Anaphylaxis   Tramadol Itching   Apple Hives   Banana Hives    Coconut Oil Hives and Itching   Dexamethasone Nausea And Vomiting and Hives    "feels like my pubic hair is on fire"   Other Hives    Zucchini and squash   Solu-Medrol [Methylprednisolone] Nausea And Vomiting   Toradol [Ketorolac Tromethamine] Hives and Itching    Consultations:    Procedures/Studies: CT CHEST WO CONTRAST  Result Date: 12/20/2021 CLINICAL DATA:  Short of breath. EXAM: CT CHEST WITHOUT CONTRAST TECHNIQUE: Multidetector CT imaging of the chest was performed following the standard protocol without IV contrast. COMPARISON:  Chest radiograph, 12/16/2021 and older exams. FINDINGS: Cardiovascular: Heart normal in size and configuration. No pericardial effusion. Great vessels are within normal limits. Mediastinum/Nodes: No enlarged mediastinal or axillary lymph nodes. Thyroid gland, trachea, and esophagus demonstrate no significant findings. Lungs/Pleura: Expiratory exam with low lung volumes. Mild dependent linear and ground-glass opacities consistent with atelectasis. Lungs otherwise clear. No pleural effusion or pneumothorax. Upper Abdomen: Liver mildly enlarged with diffuse decreased attenuation consistent with fatty infiltration. No acute findings in the visualized upper abdomen. Musculoskeletal: No chest wall mass or suspicious bone lesions identified. IMPRESSION: 1. No acute findings. 2. Lungs demonstrate mild dependent atelectasis accentuated by the low lung volumes. No evidence of pneumonia or pulmonary edema. 3. Hepatic steatosis and mild hepatomegaly. Electronically Signed   By: Amie Portland M.D.   On: 12/20/2021 15:19   MR BRAIN W WO CONTRAST  Result Date: 12/17/2021 CLINICAL DATA:  Neuro deficit, headache, stroke suspected EXAM: MRI HEAD WITHOUT AND WITH CONTRAST TECHNIQUE: Multiplanar, multiecho pulse sequences of the brain and surrounding structures were obtained without and with intravenous contrast. CONTRAST:  10mL GADAVIST GADOBUTROL 1 MMOL/ML IV SOLN COMPARISON:  CT  head 10/26/2020 FINDINGS: Brain: There is no evidence of acute intracranial hemorrhage, extra-axial fluid collection, or acute infarct. Parenchymal volume is normal. There is no parenchymal signal abnormality. The ventricles are normal in size. There is no abnormal enhancement. There is no mass lesion. There is no midline shift. Vascular: Normal flow voids. Skull and upper cervical spine: Choose Sinuses/Orbits: There is a mucous retention cyst in the right sphenoid sinus. The globes and orbits are unremarkable. Other: None. IMPRESSION: Normal appearance of the brain with no acute intracranial pathology. Electronically Signed   By: Lesia Hausen M.D.   On: 12/17/2021 17:00   DG Chest Port 1 View  Result Date: 12/16/2021 CLINICAL DATA:  Chest pain, cough EXAM: PORTABLE CHEST 1 VIEW COMPARISON:  12/01/2021 FINDINGS: Lungs volumes are small, but are symmetric and are clear. No pneumothorax or pleural effusion. Cardiac size within normal limits. Vascular crowding at the hila. Osseous structures are age-appropriate. No acute bone abnormality. IMPRESSION: Pulmonary hypoinflation Electronically Signed   By: Helyn Numbers M.D.   On: 12/16/2021 02:31   DG Chest Portable 1 View  Result Date: 12/01/2021  CLINICAL DATA:  Shortness of breath, cough, and chest pain. EXAM: PORTABLE CHEST 1 VIEW COMPARISON:  10/15/2021 FINDINGS: The cardiomediastinal silhouette is within normal limits. The lungs are hypoinflated. No airspace consolidation, edema, pleural effusion, or pneumothorax is identified. No acute osseous abnormality is seen. IMPRESSION: No active disease. Electronically Signed   By: Sebastian AcheAllen  Grady M.D.   On: 12/01/2021 16:37   ECHOCARDIOGRAM COMPLETE  Result Date: 12/16/2021    ECHOCARDIOGRAM REPORT   Patient Name:   Felicia Bradley Date of Exam: 12/16/2021 Medical Rec #:  [id number]      Height:       63.0 in Accession #:    [id number]     Weight:       298.0 lb Date of Birth:  May 20, 1991     BSA:          2.291 m  Patient Age:    30 years       BP:           106/80 mmHg Patient Gender: F              HR:           83 bpm. Exam Location:  Inpatient Procedure: 2D Echo, Color Doppler and Cardiac Doppler Indications:    CHF-Acute Diastolic I50.31  History:        Patient has prior history of Echocardiogram examinations, most                 recent 02/17/2021. Signs/Symptoms:Shortness of Breath; Risk                 Factors:Diabetes and Sleep Apnea. History of DVT/PE 2016. Severe                 asthma.  Sonographer:    Leta Junglingiffany Cooper RDCS Referring Phys: [id number] Deno LungerGEORGE J Jackson Surgical Center LLCHALHOUB  Sonographer Comments: Pastient unable to follow commands. Very sleepy through exam. IMPRESSIONS  1. Left ventricular ejection fraction, by estimation, is 60 to 65%. The left ventricle has normal function. The left ventricle has no regional wall motion abnormalities. Left ventricular diastolic parameters were normal.  2. Right ventricular systolic function is normal. The right ventricular size is normal. There is mildly elevated pulmonary artery systolic pressure.  3. The mitral valve is normal in structure. No evidence of mitral valve regurgitation. No evidence of mitral stenosis.  4. The aortic valve is normal in structure. Aortic valve regurgitation is not visualized. No aortic stenosis is present.  5. The inferior vena cava is normal in size with greater than 50% respiratory variability, suggesting right atrial pressure of 3 mmHg. FINDINGS  Left Ventricle: Left ventricular ejection fraction, by estimation, is 60 to 65%. The left ventricle has normal function. The left ventricle has no regional wall motion abnormalities. The left ventricular internal cavity size was normal in size. There is  no left ventricular hypertrophy. Left ventricular diastolic parameters were normal. Right Ventricle: The right ventricular size is normal. No increase in right ventricular wall thickness. Right ventricular systolic function is normal. There is mildly elevated  pulmonary artery systolic pressure. The tricuspid regurgitant velocity is 2.85  m/s, and with an assumed right atrial pressure of 8 mmHg, the estimated right ventricular systolic pressure is 40.5 mmHg. Left Atrium: Left atrial size was normal in size. Right Atrium: Right atrial size was normal in size. Pericardium: There is no evidence of pericardial effusion. Mitral Valve: The mitral valve is normal in structure. No evidence of mitral valve regurgitation.  No evidence of mitral valve stenosis. Tricuspid Valve: The tricuspid valve is normal in structure. Tricuspid valve regurgitation is trivial. No evidence of tricuspid stenosis. Aortic Valve: The aortic valve is normal in structure. Aortic valve regurgitation is not visualized. No aortic stenosis is present. Pulmonic Valve: The pulmonic valve was normal in structure. Pulmonic valve regurgitation is not visualized. No evidence of pulmonic stenosis. Aorta: The aortic root is normal in size and structure. Venous: The inferior vena cava is normal in size with greater than 50% respiratory variability, suggesting right atrial pressure of 3 mmHg. IAS/Shunts: No atrial level shunt detected by color flow Doppler.  LEFT VENTRICLE PLAX 2D LVIDd:         5.30 cm   Diastology LVIDs:         2.90 cm   LV e' medial:    7.40 cm/s LV PW:         0.90 cm   LV E/e' medial:  11.7 LV IVS:        0.90 cm   LV e' lateral:   5.66 cm/s LVOT diam:     2.00 cm   LV E/e' lateral: 15.3 LV SV:         54 LV SV Index:   24 LVOT Area:     3.14 cm  RIGHT VENTRICLE RV S prime:     22.80 cm/s TAPSE (M-mode): 2.1 cm LEFT ATRIUM             Index        RIGHT ATRIUM           Index LA diam:        3.60 cm 1.57 cm/m   RA Area:     12.30 cm LA Vol (A2C):   32.2 ml 14.06 ml/m  RA Volume:   27.30 ml  11.92 ml/m LA Vol (A4C):   40.5 ml 17.68 ml/m LA Biplane Vol: 36.2 ml 15.80 ml/m  AORTIC VALVE LVOT Vmax:   90.80 cm/s LVOT Vmean:  67.700 cm/s LVOT VTI:    0.173 m  AORTA Ao Root diam: 3.00 cm Ao Asc  diam:  2.80 cm MITRAL VALVE               TRICUSPID VALVE MV Area (PHT): 6.65 cm    TR Peak grad:   32.5 mmHg MV Decel Time: 114 msec    TR Vmax:        285.00 cm/s MV E velocity: 86.50 cm/s MV A velocity: 59.10 cm/s  SHUNTS MV E/A ratio:  1.46        Systemic VTI:  0.17 m                            Systemic Diam: 2.00 cm Kardie Tobb DO Electronically signed by Thomasene RippleKardie Tobb DO Signature Date/Time: 12/16/2021/2:47:43 PM    Final       Subjective: She reports continued cough.  She did not wear her CPAP overnight.  Continues to feel weak and tired.  Discharge Exam: Vitals:   12/20/21 0749 12/20/21 0752 12/20/21 2007 12/21/21 0542  BP: (!) 146/82  (!) 152/101 (!) 140/124  Pulse: 86  92 84  Resp: 18  20 16   Temp: 98 F (36.7 C)  98.5 F (36.9 C) 97.7 F (36.5 C)  TempSrc: Oral  Oral Oral  SpO2:  99% 97% 100%  Weight:      Height:  General: Pt is alert, awake, not in acute distress Cardiovascular: RRR, S1/S2 +, no rubs, no gallops Respiratory: CTA bilaterally, no wheezing, no rhonchi Abdominal: Soft, NT, ND, bowel sounds + Extremities: no edema, no cyanosis    The results of significant diagnostics from this hospitalization (including imaging, microbiology, ancillary and laboratory) are listed below for reference.     Microbiology: Recent Results (from the past 240 hour(s))  Resp Panel by RT-PCR (Flu A&B, Covid) Nasopharyngeal Swab     Status: None   Collection Time: 12/16/21  2:21 AM   Specimen: Nasopharyngeal Swab; Nasopharyngeal(NP) swabs in vial transport medium  Result Value Ref Range Status   SARS Coronavirus 2 by RT PCR NEGATIVE NEGATIVE Final    Comment: (NOTE) SARS-CoV-2 target nucleic acids are NOT DETECTED.  The SARS-CoV-2 RNA is generally detectable in upper respiratory specimens during the acute phase of infection. The lowest concentration of SARS-CoV-2 viral copies this assay can detect is 138 copies/mL. A negative result does not preclude  SARS-Cov-2 infection and should not be used as the sole basis for treatment or other patient management decisions. A negative result may occur with  improper specimen collection/handling, submission of specimen other than nasopharyngeal swab, presence of viral mutation(s) within the areas targeted by this assay, and inadequate number of viral copies(<138 copies/mL). A negative result must be combined with clinical observations, patient history, and epidemiological information. The expected result is Negative.  Fact Sheet for Patients:  BloggerCourse.com  Fact Sheet for Healthcare Providers:  SeriousBroker.it  This test is no t yet approved or cleared by the [place] FDA and  has been authorized for detection and/or diagnosis of SARS-CoV-2 by FDA under an Emergency Use Authorization (EUA). This EUA will remain  in effect (meaning this test can be used) for the duration of the COVID-19 declaration under Section 564(b)(1) of the Act, 21 U.S.C.section 360bbb-3(b)(1), unless the authorization is terminated  or revoked sooner.       Influenza A by PCR NEGATIVE NEGATIVE Final   Influenza B by PCR NEGATIVE NEGATIVE Final    Comment: (NOTE) The Xpert Xpress SARS-CoV-2/FLU/RSV plus assay is intended as an aid in the diagnosis of influenza from Nasopharyngeal swab specimens and should not be used as a sole basis for treatment. Nasal washings and aspirates are unacceptable for Xpert Xpress SARS-CoV-2/FLU/RSV testing.  Fact Sheet for Patients: BloggerCourse.com  Fact Sheet for Healthcare Providers: SeriousBroker.it  This test is not yet approved or cleared by the [place] FDA and has been authorized for detection and/or diagnosis of SARS-CoV-2 by FDA under an Emergency Use Authorization (EUA). This EUA will remain in effect (meaning this test can be used) for the duration of  the COVID-19 declaration under Section 564(b)(1) of the Act, 21 U.S.C. section 360bbb-3(b)(1), unless the authorization is terminated or revoked.  Performed at [facility], [address] [place], [place] [address]   Culture, blood (routine x 2)     Status: None   Collection Time: 12/16/21  2:50 AM   Specimen: BLOOD  Result Value Ref Range Status   Specimen Description   Final    BLOOD RIGHT ANTECUBITAL Performed at [facility], [address], [place], [place] [address]    Special Requests   Final    BOTTLES DRAWN AEROBIC AND ANAEROBIC Blood Culture adequate volume Performed at [facility], [address] [place], [place] [address]    Culture   Final    NO GROWTH 5 DAYS  Performed at [facility], [address] [place], [place] [address]    Report Status 12/21/2021 FINAL  Final  Culture, blood (routine x 2)     Status: None   Collection Time: 12/16/21  2:50 AM   Specimen: BLOOD  Result Value Ref Range Status   Specimen Description   Final    BLOOD BLOOD RIGHT FOREARM Performed at [facility], [address], [place], [place] [address]    Special Requests   Final    BOTTLES DRAWN AEROBIC AND ANAEROBIC Blood Culture adequate volume Performed at [facility], [address], [place], [place] [address]    Culture   Final    NO GROWTH 5 DAYS Performed at [facility], [address], [place], [place] [address]    Report Status 12/21/2021 FINAL  Final  Respiratory (~20 pathogens) panel by PCR     Status: None   Collection Time: 12/16/21 10:03 AM   Specimen: Nasopharyngeal Swab; Respiratory  Result Value Ref Range Status   Adenovirus NOT DETECTED NOT DETECTED Final   Coronavirus 229E NOT DETECTED NOT DETECTED Final    Comment: (NOTE) The Coronavirus on the Respiratory Panel, DOES NOT test for the novel  Coronavirus (2019 nCoV)    Coronavirus HKU1 NOT DETECTED  NOT DETECTED Final   Coronavirus NL63 NOT DETECTED NOT DETECTED Final   Coronavirus OC43 NOT DETECTED NOT DETECTED Final   Metapneumovirus NOT DETECTED NOT DETECTED Final   Rhinovirus / Enterovirus NOT DETECTED NOT DETECTED Final   Influenza A NOT DETECTED NOT DETECTED Final   Influenza B NOT DETECTED NOT DETECTED Final   Parainfluenza Virus 1 NOT DETECTED NOT DETECTED Final   Parainfluenza Virus 2 NOT DETECTED NOT DETECTED Final   Parainfluenza Virus 3 NOT DETECTED NOT DETECTED Final   Parainfluenza Virus 4 NOT DETECTED NOT DETECTED Final   Respiratory Syncytial Virus NOT DETECTED NOT DETECTED Final   Bordetella pertussis NOT DETECTED NOT DETECTED Final   Bordetella Parapertussis NOT DETECTED NOT DETECTED Final   Chlamydophila pneumoniae NOT DETECTED NOT DETECTED Final   Mycoplasma pneumoniae NOT DETECTED NOT DETECTED Final    Comment: Performed at [facility], [address] [place], [place] [address]     Labs: BNP (last 3 results) Recent Labs    07/03/21 0029 08/24/21 1845 12/16/21 0214  BNP 24.7 8.2 16.1   Basic Metabolic Panel: Recent Labs  Lab 12/16/21 0214 12/17/21 0610 12/20/21 0547  NA 140 141 139  K 3.4* 4.5 4.0  CL 107 107 100  CO2 25 28 30   GLUCOSE 134* 127* 167*  BUN 10 14 17   CREATININE 0.75 0.72 0.77  CALCIUM 8.6* 9.0 8.8*  MG  --  2.4  --   PHOS  --  3.5  --    Liver Function Tests: Recent Labs  Lab 12/17/21 0610 12/20/21 0547  AST 15 15  ALT 16 15  ALKPHOS 46 60  BILITOT 0.4 0.3  PROT 7.5 6.9  ALBUMIN 3.2* 3.2*   No results for input(s): LIPASE, AMYLASE in the last 168 hours. No results for input(s): AMMONIA in the last 168 hours. CBC: Recent Labs  Lab 12/16/21 0214 12/17/21 0610 12/20/21 0547  WBC 16.5* 14.4* 13.0*  NEUTROABS 11.4* 11.4*  --   HGB 10.8* 10.7* 11.3*  HCT 35.8* 35.6* 38.3  MCV 86.9 88.6 90.1  PLT 416* 412* 344   Cardiac Enzymes: No results for input(s): CKTOTAL, CKMB, CKMBINDEX, TROPONINI in  the last 168  hours. BNP: Invalid input(s): POCBNP CBG: Recent Labs  Lab 12/20/21 0741 12/20/21 1146 12/20/21 1652 12/20/21 2143 12/21/21 0811  GLUCAP 150* 99 181* 200* 109*   D-Dimer No results for input(s): DDIMER in the last 72 hours. Hgb A1c No results for input(s): HGBA1C in the last 72 hours. Lipid Profile No results for input(s): CHOL, HDL, LDLCALC, TRIG, CHOLHDL, LDLDIRECT in the last 72 hours. Thyroid function studies No results for input(s): TSH, T4TOTAL, T3FREE, THYROIDAB in the last 72 hours.  Invalid input(s): FREET3 Anemia work up No results for input(s): VITAMINB12, FOLATE, FERRITIN, TIBC, IRON, RETICCTPCT in the last 72 hours. Urinalysis    Component Value Date/Time   COLORURINE YELLOW 07/27/2021 0104   APPEARANCEUR HAZY (A) 07/27/2021 0104   LABSPEC >1.030 (H) 07/27/2021 0104   PHURINE 6.0 07/27/2021 0104   GLUCOSEU NEGATIVE 07/27/2021 0104   HGBUR LARGE (A) 07/27/2021 0104   BILIRUBINUR NEGATIVE 07/27/2021 0104   KETONESUR NEGATIVE 07/27/2021 0104   PROTEINUR NEGATIVE 07/27/2021 0104   UROBILINOGEN 0.2 12/04/2009 1301   NITRITE NEGATIVE 07/27/2021 0104   LEUKOCYTESUR NEGATIVE 07/27/2021 0104   Sepsis Labs Invalid input(s): PROCALCITONIN,  WBC,  LACTICIDVEN Microbiology Recent Results (from the past 240 hour(s))  Resp Panel by RT-PCR (Flu A&B, Covid) Nasopharyngeal Swab     Status: None   Collection Time: 12/16/21  2:21 AM   Specimen: Nasopharyngeal Swab; Nasopharyngeal(NP) swabs in vial transport medium  Result Value Ref Range Status   SARS Coronavirus 2 by RT PCR NEGATIVE NEGATIVE Final    Comment: (NOTE) SARS-CoV-2 target nucleic acids are NOT DETECTED.  The SARS-CoV-2 RNA is generally detectable in upper respiratory specimens during the acute phase of infection. The lowest concentration of SARS-CoV-2 viral copies this assay can detect is 138 copies/mL. A negative result does not preclude SARS-Cov-2 infection and should not be used as the sole basis for  treatment or other patient management decisions. A negative result may occur with  improper specimen collection/handling, submission of specimen other than nasopharyngeal swab, presence of viral mutation(s) within the areas targeted by this assay, and inadequate number of viral copies(<138 copies/mL). A negative result must be combined with clinical observations, patient history, and epidemiological information. The expected result is Negative.  Fact Sheet for Patients:  BloggerCourse.com  Fact Sheet for Healthcare Providers:  SeriousBroker.it  This test is no t yet approved or cleared by the [place] FDA and  has been authorized for detection and/or diagnosis of SARS-CoV-2 by FDA under an Emergency Use Authorization (EUA). This EUA will remain  in effect (meaning this test can be used) for the duration of the COVID-19 declaration under Section 564(b)(1) of the Act, 21 U.S.C.section 360bbb-3(b)(1), unless the authorization is terminated  or revoked sooner.       Influenza A by PCR NEGATIVE NEGATIVE Final   Influenza B by PCR NEGATIVE NEGATIVE Final    Comment: (NOTE) The Xpert Xpress SARS-CoV-2/FLU/RSV plus assay is intended as an aid in the diagnosis of influenza from Nasopharyngeal swab specimens and should not be used as a sole basis for treatment. Nasal washings and aspirates are unacceptable for Xpert Xpress SARS-CoV-2/FLU/RSV testing.  Fact Sheet for Patients: BloggerCourse.com  Fact Sheet for Healthcare Providers: SeriousBroker.it  This test is not yet approved or cleared by the [place] FDA and has been authorized for detection and/or diagnosis of SARS-CoV-2 by FDA under an Emergency Use Authorization (EUA). This EUA will remain in effect (meaning this test can be used) for  the duration of the COVID-19 declaration under Section 564(b)(1) of the Act, 21  U.S.C. section 360bbb-3(b)(1), unless the authorization is terminated or revoked.  Performed at [facility], [address] [place], [place] [address]   Culture, blood (routine x 2)     Status: None   Collection Time: 12/16/21  2:50 AM   Specimen: BLOOD  Result Value Ref Range Status   Specimen Description   Final    BLOOD RIGHT ANTECUBITAL Performed at [facility], [address], [place], [place] [address]    Special Requests   Final    BOTTLES DRAWN AEROBIC AND ANAEROBIC Blood Culture adequate volume Performed at [facility], [address], [place], [place] [address]    Culture   Final    NO GROWTH 5 DAYS Performed at [facility], [address], [place], [place] [address]    Report Status 12/21/2021 FINAL  Final  Culture, blood (routine x 2)     Status: None   Collection Time: 12/16/21  2:50 AM   Specimen: BLOOD  Result Value Ref Range Status   Specimen Description   Final    BLOOD BLOOD RIGHT FOREARM Performed at [facility], [address] [place], [place] [address]    Special Requests   Final    BOTTLES DRAWN AEROBIC AND ANAEROBIC Blood Culture adequate volume Performed at [facility], [address], [place], [place] [address]    Culture   Final    NO GROWTH 5 DAYS Performed at [facility], [address] [place], [place] [address]    Report Status 12/21/2021 FINAL  Final  Respiratory (~20 pathogens) panel by PCR     Status: None   Collection Time: 12/16/21 10:03 AM   Specimen: Nasopharyngeal Swab; Respiratory  Result Value Ref Range Status   Adenovirus NOT DETECTED NOT DETECTED Final   Coronavirus 229E NOT DETECTED NOT DETECTED Final    Comment: (NOTE) The Coronavirus on the Respiratory Panel, DOES NOT test for the novel  Coronavirus (2019 nCoV)    Coronavirus HKU1 NOT DETECTED NOT DETECTED Final   Coronavirus NL63 NOT DETECTED NOT DETECTED  Final   Coronavirus OC43 NOT DETECTED NOT DETECTED Final   Metapneumovirus NOT DETECTED NOT DETECTED Final   Rhinovirus / Enterovirus NOT DETECTED NOT DETECTED Final   Influenza A NOT DETECTED NOT DETECTED Final   Influenza B NOT DETECTED NOT DETECTED Final   Parainfluenza Virus 1 NOT DETECTED NOT DETECTED Final   Parainfluenza Virus 2 NOT DETECTED NOT DETECTED Final   Parainfluenza Virus 3 NOT DETECTED NOT DETECTED Final   Parainfluenza Virus 4 NOT DETECTED NOT DETECTED Final   Respiratory Syncytial Virus NOT DETECTED NOT DETECTED Final   Bordetella pertussis NOT DETECTED NOT DETECTED Final   Bordetella Parapertussis NOT DETECTED NOT DETECTED Final   Chlamydophila pneumoniae NOT DETECTED NOT DETECTED Final   Mycoplasma pneumoniae NOT DETECTED NOT DETECTED Final    Comment: Performed at [facility], [address] [place], [place] [address]     Time coordinating discharge:  SIGNED:   Erick Blinks, MD  [facility] 12/21/2021, 10:10 PM   If 7PM-7AM, please contact night-coverage [email or web address]

## 2021-12-21 NOTE — Progress Notes (Signed)

## 2021-12-21 NOTE — Progress Notes (Signed)
Pts oxygen saturation is 100% on room air while at rest. Pts oxygen saturation is 97% while ambulating.

## 2021-12-21 NOTE — Progress Notes (Signed)
Pt expressed her concerns with nursing staff about wanting to go home. Pt became agitated and pt and pt's daughter became verbally disrespectful to all nursing staff. MD made aware. Charge RN made aware.   MD informed RN that the pt was going to be discharged to home today. Pt refused to wait to receive all discharge paperwork and instructions from RN. Pt pulled out her IV. RN informed pt that without receiving discharge paperwork that she would be leaving AMA. Pt refused to sign AMA paperwork. MD made aware.

## 2022-02-21 ENCOUNTER — Emergency Department (HOSPITAL_COMMUNITY)
Admission: EM | Admit: 2022-02-21 | Discharge: 2022-02-22 | Disposition: A | Discharge status: Home / Self Care | Payer: Medicaid Other | Attending: Emergency Medicine | Admitting: Emergency Medicine

## 2022-02-21 ENCOUNTER — Emergency Department (HOSPITAL_COMMUNITY): Payer: Medicaid Other

## 2022-02-21 ENCOUNTER — Encounter (HOSPITAL_COMMUNITY): Payer: Self-pay | Admitting: Emergency Medicine

## 2022-02-21 ENCOUNTER — Other Ambulatory Visit: Payer: Self-pay

## 2022-02-21 DIAGNOSIS — M545 Low back pain, unspecified: Secondary | ICD-10-CM | POA: Insufficient documentation

## 2022-02-21 DIAGNOSIS — I10 Essential (primary) hypertension: Secondary | ICD-10-CM | POA: Insufficient documentation

## 2022-02-21 DIAGNOSIS — Z87891 Personal history of nicotine dependence: Secondary | ICD-10-CM | POA: Diagnosis not present

## 2022-02-21 DIAGNOSIS — E119 Type 2 diabetes mellitus without complications: Secondary | ICD-10-CM | POA: Insufficient documentation

## 2022-02-21 DIAGNOSIS — R102 Pelvic and perineal pain: Secondary | ICD-10-CM | POA: Diagnosis not present

## 2022-02-21 DIAGNOSIS — O209 Hemorrhage in early pregnancy, unspecified: Secondary | ICD-10-CM | POA: Diagnosis not present

## 2022-02-21 DIAGNOSIS — J45909 Unspecified asthma, uncomplicated: Secondary | ICD-10-CM | POA: Diagnosis not present

## 2022-02-21 DIAGNOSIS — Z3A00 Weeks of gestation of pregnancy not specified: Secondary | ICD-10-CM | POA: Insufficient documentation

## 2022-02-21 MED ORDER — ACETAMINOPHEN 500 MG PO TABS
1000.0000 mg | ORAL_TABLET | Freq: Once | ORAL | Status: AC
  Administered 2022-02-21: 1000 mg via ORAL
  Filled 2022-02-21: qty 2

## 2022-02-21 NOTE — ED Triage Notes (Addendum)
Pt BIB EMS from home c/o lower right sided back pain for a few weeks. Also c/o vaginal cramping and vaginal bleeding that started on 3/8 after intercourse. Denies trauma. LMP 1/3. Positive pregnancy test on 3/8. ?

## 2022-02-21 NOTE — ED Provider Notes (Signed)
?[facility]-EMERGENCY DEPT ?[facility] Emergency Department ?Provider Note ?MRN:  [id number]  ?Arrival date & time: 02/22/22    ? ?Chief Complaint   ?Back Pain and Vaginal Pain ?  ?History of Present Illness   ?Felicia Bradley is a 31 y.o. year-old female with a history of diabetes, hypertension, PE presenting to the ED with chief complaint of back pain and vaginal pain. ? ?Patient endorsing right lower lumbar cramping back pain as well as lower abdominal cramping similar to menstrual cramps.  She is also having some light vaginal bleeding.  Recent positive pregnancy test at home.  Denies fever, no chest pain or shortness of breath. ? ?Review of Systems  ?A thorough review of systems was obtained and all systems are negative except as noted in the HPI and PMH.  ? ?Patient's Health History   ? ?Past Medical History:  ?Diagnosis Date  ? Asthma   ? Bipolar disorder (HCC)   ? Diabetes mellitus without complication (HCC)   ? type 2  ? DVT (deep venous thrombosis) (HCC) 2015  ? w/ recannulation per Care Everywhere  ? Hypertension   ? Obesity   ? Pulmonary embolism (HCC) 2015  ?  ?Past Surgical History:  ?Procedure Laterality Date  ? CESAREAN SECTION    ?  ?Family History  ?Problem Relation Age of Onset  ? Diabetes Mother   ? Asthma Mother   ? Asthma Maternal Aunt   ? Diabetes Maternal Aunt   ?  ?Social History  ? ?Socioeconomic History  ? Marital status: Single  ?  Spouse name: Not on file  ? Number of children: Not on file  ? Years of education: Not on file  ? Highest education level: Not on file  ?Occupational History  ? Occupation: Brewing technologist  ?  Employer: [employer]  ?Tobacco Use  ? Smoking status: Former  ?  Types: Cigarettes  ? Smokeless tobacco: Never  ?Substance and Sexual Activity  ? Alcohol use: No  ? Drug use: No  ? Sexual activity: Not on file  ?Other Topics Concern  ? Not on file  ?Social History Narrative  ? Not on file  ? ?Social Determinants of Health  ? ?Financial Resource Strain: Not on file  ?Food  Insecurity: Not on file  ?Transportation Needs: Not on file  ?Physical Activity: Not on file  ?Stress: Not on file  ?Social Connections: Not on file  ?Intimate Partner Violence: Not on file  ?  ? ?Physical Exam  ? ?Vitals:  ? 02/22/22 0159 02/22/22 0300  ?BP: 123/70 123/70  ?Pulse: 92 92  ?Resp: 20 20  ?Temp:    ?SpO2: 100% 100%  ?  ?CONSTITUTIONAL: Well-appearing, NAD ?NEURO/PSYCH:  Alert and oriented x 3, no focal deficits ?EYES:  eyes equal and reactive ?ENT/NECK:  no LAD, no JVD ?CARDIO: Regular rate, well-perfused, normal S1 and S2 ?PULM:  CTAB no wheezing or rhonchi ?GI/GU:  non-distended, non-tender ?MSK/SPINE:  No gross deformities, no edema ?SKIN:  no rash, atraumatic ? ? ?*Additional and/or pertinent findings included in MDM below ? ?Diagnostic and Interventional Summary  ? ? EKG Interpretation ? ?Date/Time:    ?Ventricular Rate:    ?PR Interval:    ?QRS Duration:   ?QT Interval:    ?QTC Calculation:   ?R Axis:     ?Text Interpretation:   ?  ? ?  ? ?Labs Reviewed  ?CBC - Abnormal; Notable for the following components:  ?    Result Value  ? Hemoglobin 10.7 (*)   ?  HCT 34.8 (*)   ? RDW 16.6 (*)   ? All other components within normal limits  ?COMPREHENSIVE METABOLIC PANEL - Abnormal; Notable for the following components:  ? Glucose, Bld 103 (*)   ? Albumin 3.4 (*)   ? AST 12 (*)   ? Total Bilirubin 0.2 (*)   ? All other components within normal limits  ?HCG, QUANTITATIVE, PREGNANCY - Abnormal; Notable for the following components:  ? hCG, Beta Chain, Quant, S 252 (*)   ? All other components within normal limits  ?URINALYSIS, ROUTINE W REFLEX MICROSCOPIC - Abnormal; Notable for the following components:  ? APPearance HAZY (*)   ? Hgb urine dipstick SMALL (*)   ? Leukocytes,Ua LARGE (*)   ? Bacteria, UA FEW (*)   ? All other components within normal limits  ?URINE CULTURE  ?ABO/RH  ?  ?US OB Comp < 14 Wks  ?Final Result  ?  ?US OB Transvaginal  ?Final Result  ?  ?  ?Medications  ?acetaminophen (TYLENOL) tablet  1,000 mg (1,000 mg Oral Given 02/21/22 2343)  ?  ? ?Procedures  /  Critical Care ?Procedures ? ?ED Course and Medical Decision Making  ?Initial Impression and Ddx ?Differential diagnosis includes vaginal bleeding in early pregnancy, miscarriage, ectopic pregnancy.  Back pain could be related to uterine cramps or MSK.  No red flag symptoms to suggest myelopathy.  Awaiting labs, urine sample, ultrasound. ? ?Past medical/surgical history that increases complexity of ED encounter: None ? ?Interpretation of Diagnostics ?I personally reviewed the laboratory assessment and my interpretation is as follows: No significant blood count or electrolyte disturbance ?   ?hCG is in the 200s, consistent with a pregnancy of only 2 weeks duration.  Ultrasound is largely noncontributory, unable to visualize anything meaningful ? ?Patient Reassessment and Ultimate Disposition/Management ?Patient continues to look and feel well, she is made aware of the lack of information on ultrasound, how early her pregnancy is.  She agrees to return to the Emergency Department if symptoms worsen, agrees to try to follow-up with OB/GYN within the next 1 to 2 weeks. ? ?Patient management required discussion with the following services or consulting groups:  None ? ?Complexity of Problems Addressed ?Acute illness or injury that poses threat of life of bodily function ? ?Additional Data Reviewed and Analyzed ?Further history obtained from: ?None ? ?Additional Factors Impacting ED Encounter Risk ?Consideration of hospitalization ? ?Elmer Sow. Pilar Plate, MD ?[facility] Emergency Medicine ?[facility] [email or web address] ? ?Final Clinical Impressions(s) / ED Diagnoses  ? ?  ICD-10-CM   ?1. Bleeding in early pregnancy  O20.9   ?  ?  ?ED Discharge Orders   ? ? None  ? ?  ?  ? ?Discharge Instructions Discussed with and Provided to Patient:  ? ? ? ?Discharge Instructions   ? ?  ?You were evaluated in the Emergency Department and after careful  evaluation, we did not find any emergent condition requiring admission or further testing in the hospital. ? ?Your exam/testing today was overall reassuring.  It is too early in your pregnancy to see anything on your ultrasound.  Recommend close follow-up with your OB/GYN for repeat evaluation and ultrasound.  If you cannot follow-up with your OB/GYN within the next 1 to 2 weeks, please return to the emergency department for repeat evaluation. ? ?Please return to the Emergency Department if you experience any worsening of your condition.  Thank you for allowing [place] to be a part of your care. ? ? ? ? ? ?  ?  Sabas SousBero, Dayne Chait M, MD ?02/22/22 [phone] ? ?

## 2022-02-21 NOTE — ED Notes (Signed)
ED Provider at bedside. 

## 2022-02-22 ENCOUNTER — Emergency Department (HOSPITAL_COMMUNITY): Payer: Medicaid Other

## 2022-02-22 LAB — URINALYSIS, ROUTINE W REFLEX MICROSCOPIC
Bilirubin Urine: NEGATIVE
Glucose, UA: NEGATIVE mg/dL
Ketones, ur: NEGATIVE mg/dL
Nitrite: NEGATIVE
Protein, ur: NEGATIVE mg/dL
Specific Gravity, Urine: 1.028 (ref 1.005–1.030)
pH: 5 (ref 5.0–8.0)

## 2022-02-22 LAB — COMPREHENSIVE METABOLIC PANEL
ALT: 12 U/L (ref 0–44)
AST: 12 U/L — ABNORMAL LOW (ref 15–41)
Albumin: 3.4 g/dL — ABNORMAL LOW (ref 3.5–5.0)
Alkaline Phosphatase: 61 U/L (ref 38–126)
Anion gap: 5 (ref 5–15)
BUN: 16 mg/dL (ref 6–20)
CO2: 26 mmol/L (ref 22–32)
Calcium: 9.1 mg/dL (ref 8.9–10.3)
Chloride: 107 mmol/L (ref 98–111)
Creatinine, Ser: 0.62 mg/dL (ref 0.44–1.00)
GFR, Estimated: >60 mL/min (ref >60)
Glucose, Bld: 103 mg/dL — ABNORMAL HIGH (ref 70–99)
Potassium: 3.5 mmol/L (ref 3.5–5.1)
Sodium: 138 mmol/L (ref 135–145)
Total Bilirubin: 0.2 mg/dL — ABNORMAL LOW (ref 0.3–1.2)
Total Protein: 7.4 g/dL (ref 6.5–8.1)

## 2022-02-22 LAB — CBC
HCT: 34.8 % — ABNORMAL LOW (ref 36.0–46.0)
Hemoglobin: 10.7 g/dL — ABNORMAL LOW (ref 12.0–15.0)
MCH: 27.2 pg (ref 26.0–34.0)
MCHC: 30.7 g/dL (ref 30.0–36.0)
MCV: 88.5 fL (ref 80.0–100.0)
Platelets: 394 10*3/uL (ref 150–400)
RBC: 3.93 MIL/uL (ref 3.87–5.11)
RDW: 16.6 % — ABNORMAL HIGH (ref 11.5–15.5)
WBC: 9 10*3/uL (ref 4.0–10.5)
nRBC: 0 % (ref 0.0–0.2)

## 2022-02-22 LAB — ABO/RH: ABO/RH(D): O POS

## 2022-02-22 LAB — HCG, QUANTITATIVE, PREGNANCY: hCG, Beta Chain, Quant, S: 252 m[IU]/mL — ABNORMAL HIGH (ref <5)

## 2022-02-22 IMAGING — US US OB COMP LESS 14 WK
1 series · 13 of 28 positions shown · non-contrast
Comparison: None available.

CLINICAL DATA: Initial evaluation for acute pelvic cramping,
vaginal bleeding. Early pregnancy.

EXAM:
OBSTETRIC <14 WK US AND TRANSVAGINAL OB US
TECHNIQUE: Both transabdominal and transvaginal ultrasound examinations were
performed for complete evaluation of the gestation as well as the
maternal uterus, adnexal regions, and pelvic cul-de-sac.
Transvaginal technique was performed to assess early pregnancy.

[Series 1: us ob comp less 14 wk · 13 of 54 slices shown]
[im 2/54]
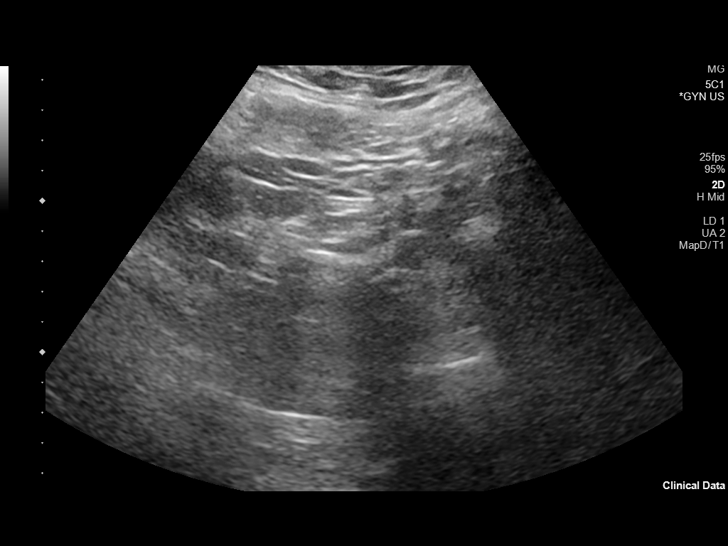
[im 6/54]
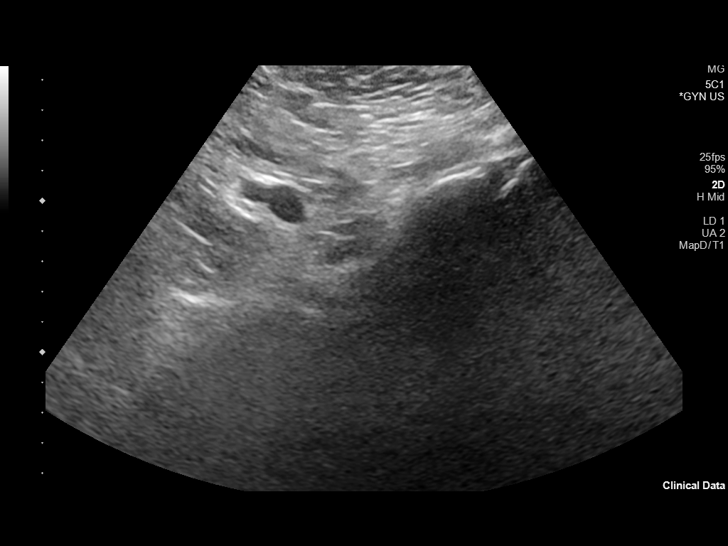
[im 10/54]
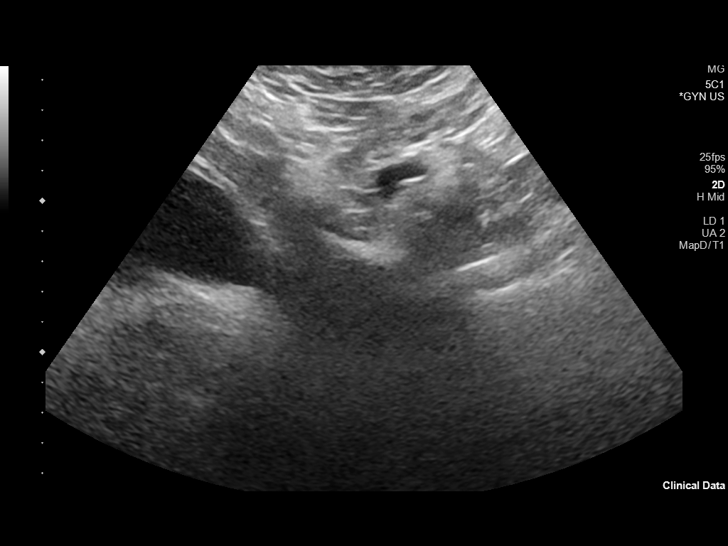
[im 14/54]
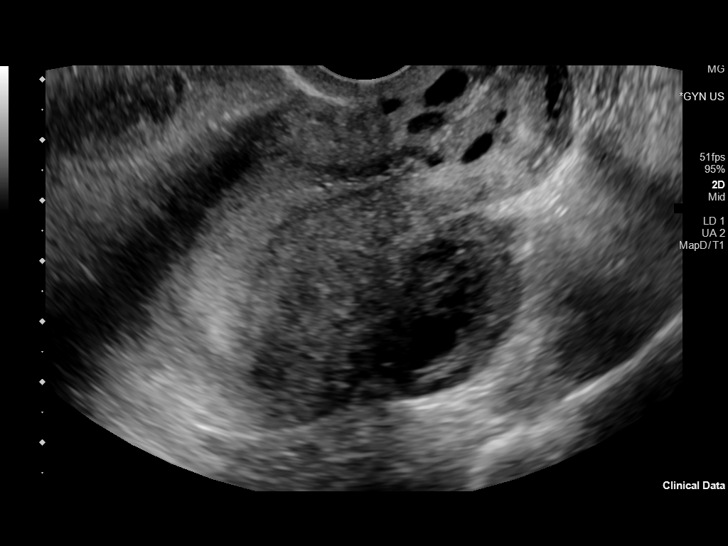
[im 18/54]
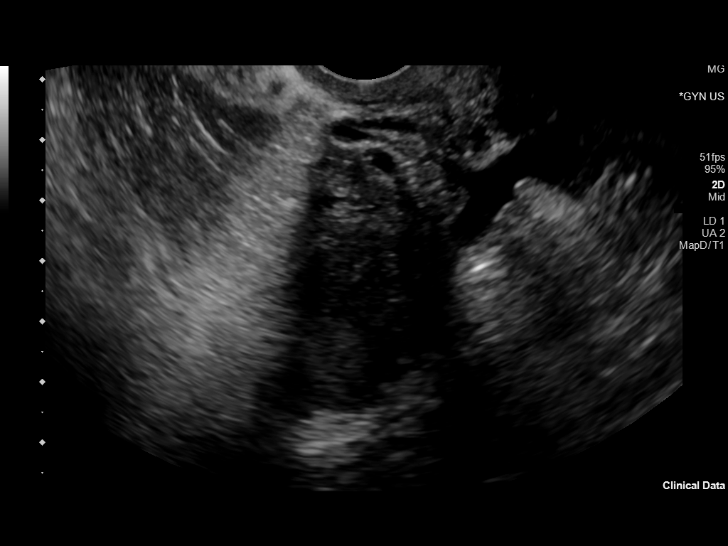
[im 22/54]
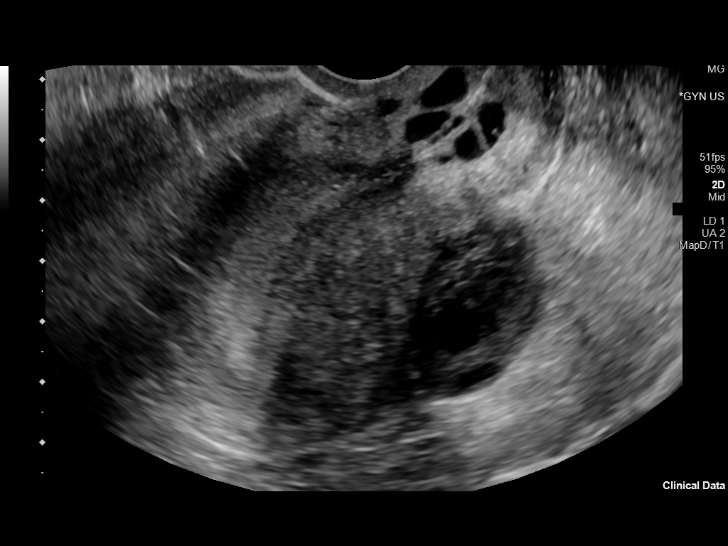
[im 28/54]
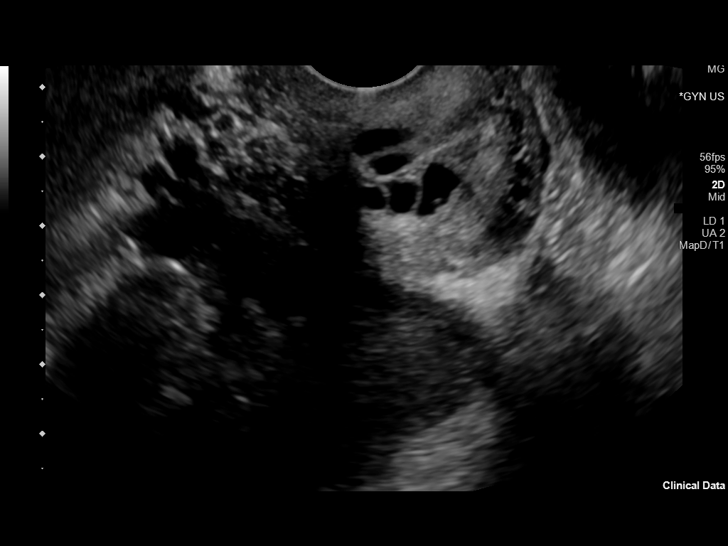
[im 32/54]
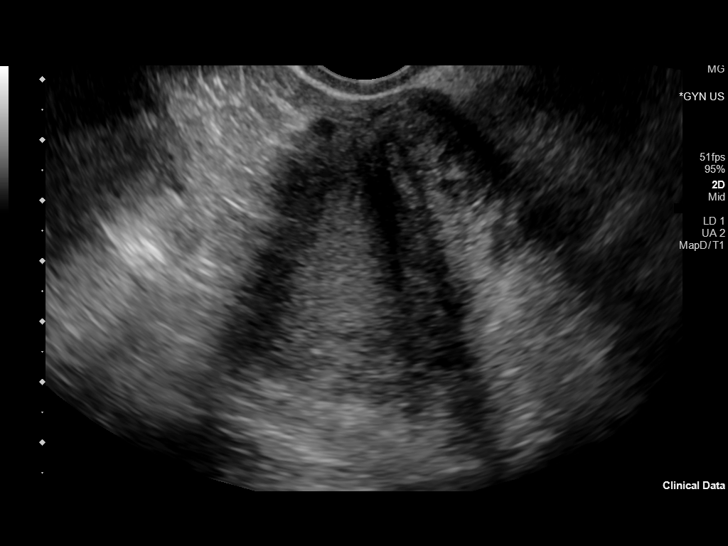
[im 36/54]
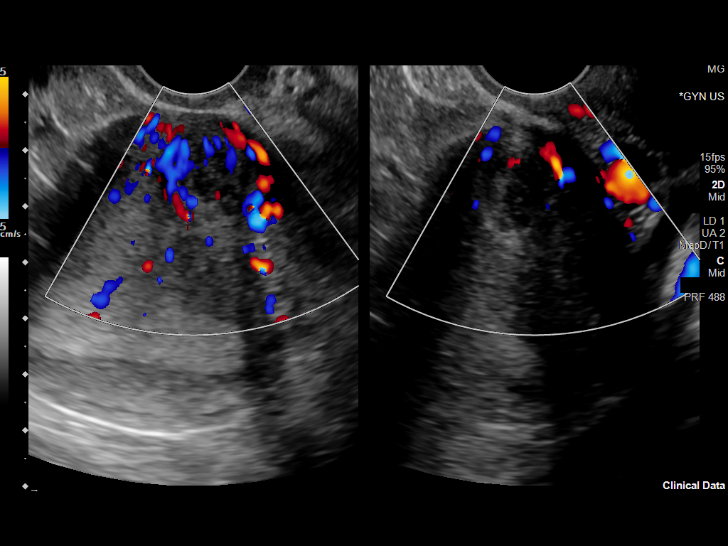
[im 40/54]
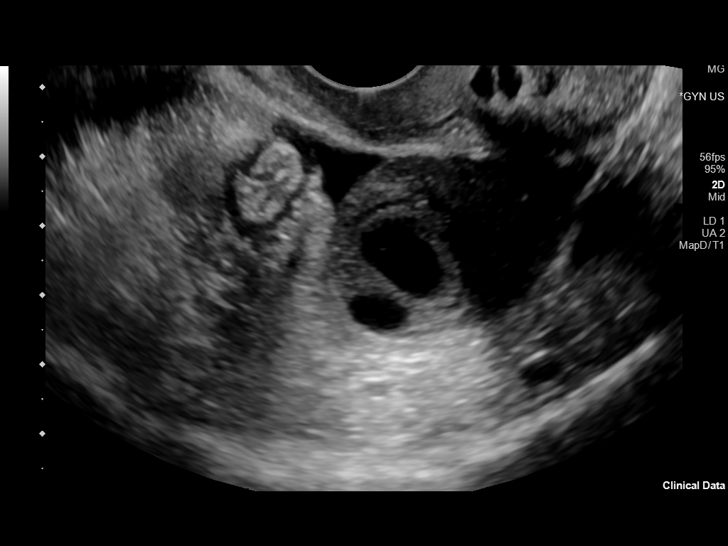
[im 44/54]
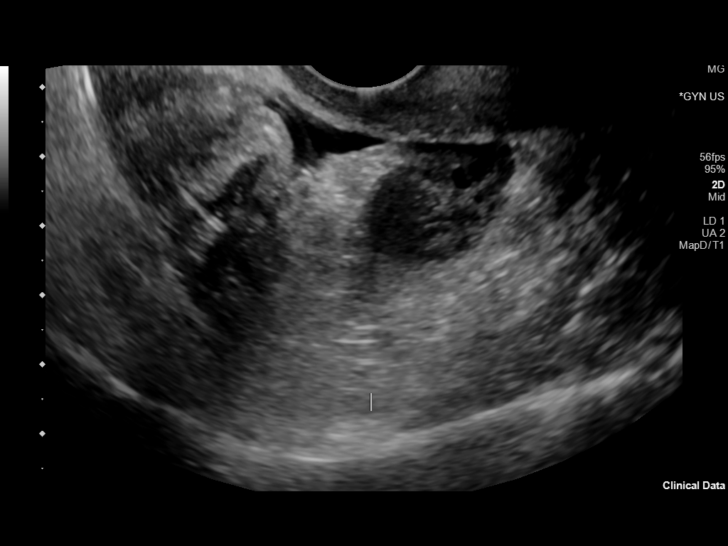
[im 48/54]
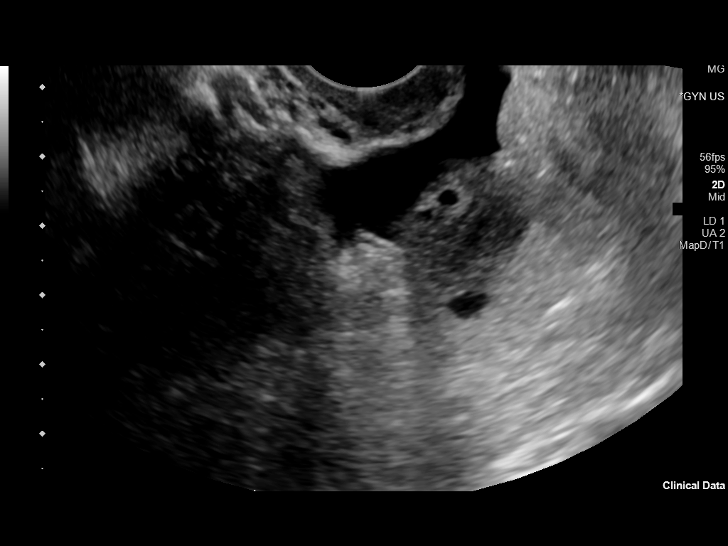
[im 52/54]
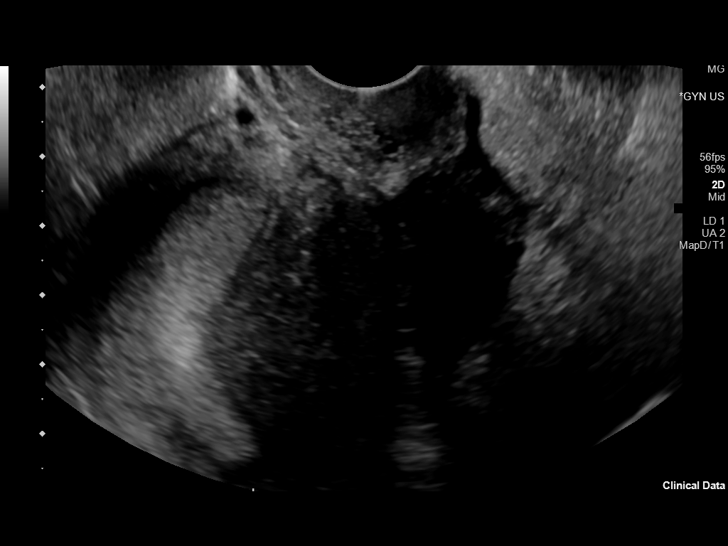

[13 of 28 positions shown; findings below may reference images not displayed]

FINDINGS: Intrauterine gestational sac: Negative.

Yolk sac:  Negative.

Embryo:  Negative.

Cardiac Activity: Negative.

Heart Rate: N/A

Subchorionic hemorrhage:  None visualized.

Maternal uterus/adnexae: Right ovary not visualized. 1.4 cm
degenerating corpus luteal cyst noted within the left ovary.
Associated small volume free fluid within the pelvis.

[DATE] x 2.1 x 2.1 cm heterogeneous intramural fibroid noted within the
left uterine body.
IMPRESSION: 1. Early pregnancy with no discrete IUP or adnexal mass identified.
Finding is consistent with a pregnancy of unknown anatomic location.
Differential considerations include IUP to early to visualize,
recent SAB, or possibly occult ectopic pregnancy. Close clinical
monitoring with serial beta HCGs and close interval follow-up
ultrasound recommended as clinically warranted.
2. Degenerating left ovarian corpus luteal cyst with associated
small volume free fluid within the pelvis.
[DATE] cm intramural fibroid at the left uterine body.

## 2022-02-22 IMAGING — US US OB TRANSVAGINAL
1 series · 13 of 28 positions shown · non-contrast
Comparison: None available.

CLINICAL DATA: Initial evaluation for acute pelvic cramping,
vaginal bleeding. Early pregnancy.

EXAM:
OBSTETRIC <14 WK US AND TRANSVAGINAL OB US
TECHNIQUE: Both transabdominal and transvaginal ultrasound examinations were
performed for complete evaluation of the gestation as well as the
maternal uterus, adnexal regions, and pelvic cul-de-sac.
Transvaginal technique was performed to assess early pregnancy.

[Series 1: us ob transvaginal · 13 of 54 slices shown]
[im 2/54]
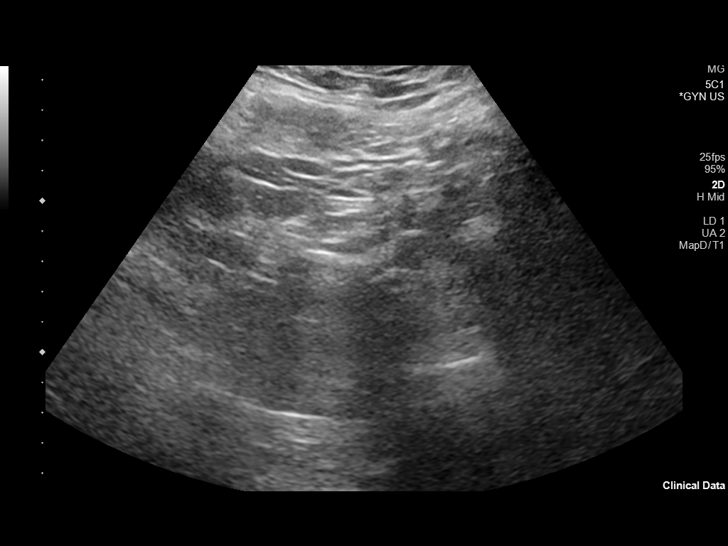
[im 6/54]
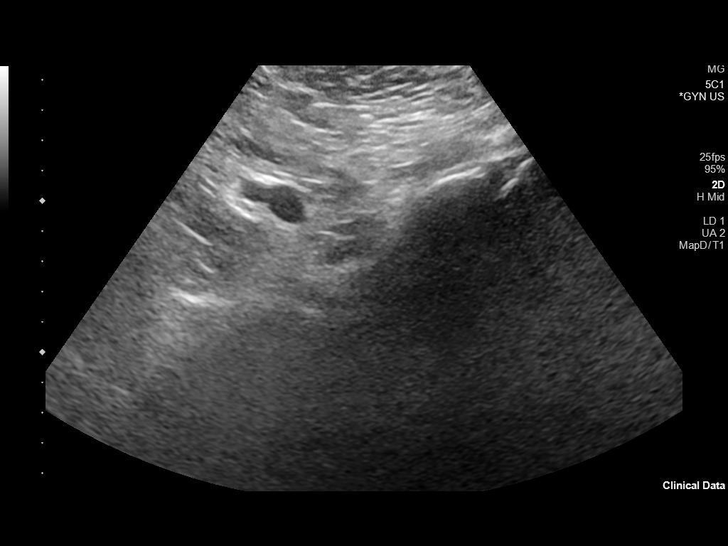
[im 10/54]
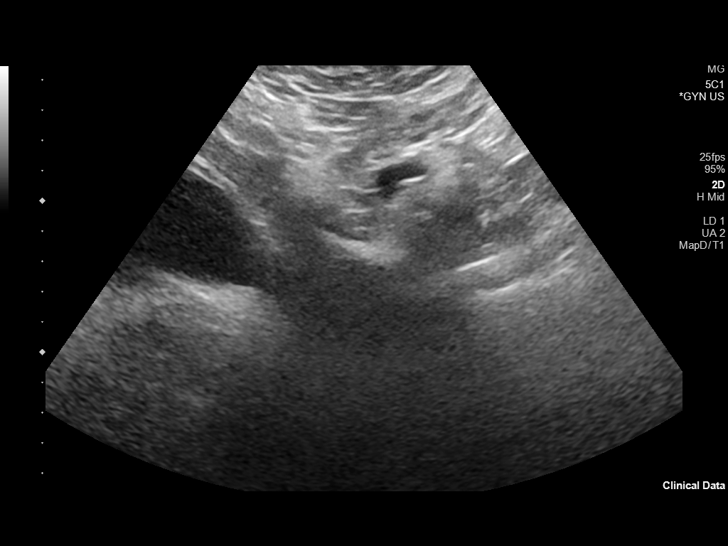
[im 14/54]
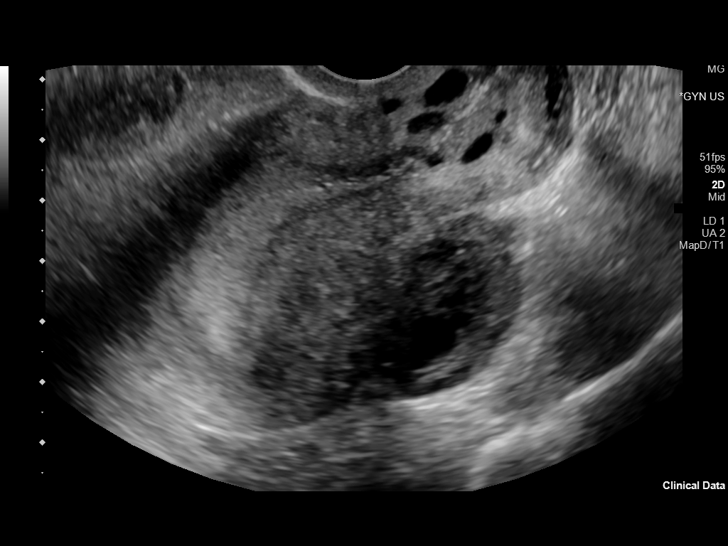
[im 18/54]
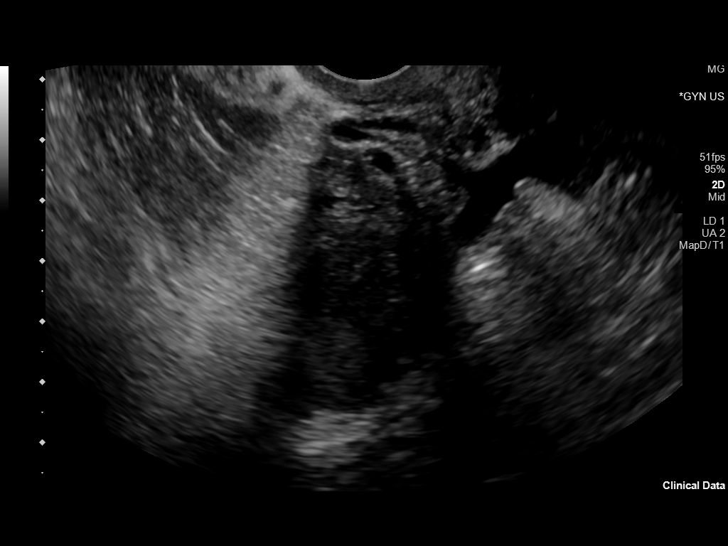
[im 22/54]
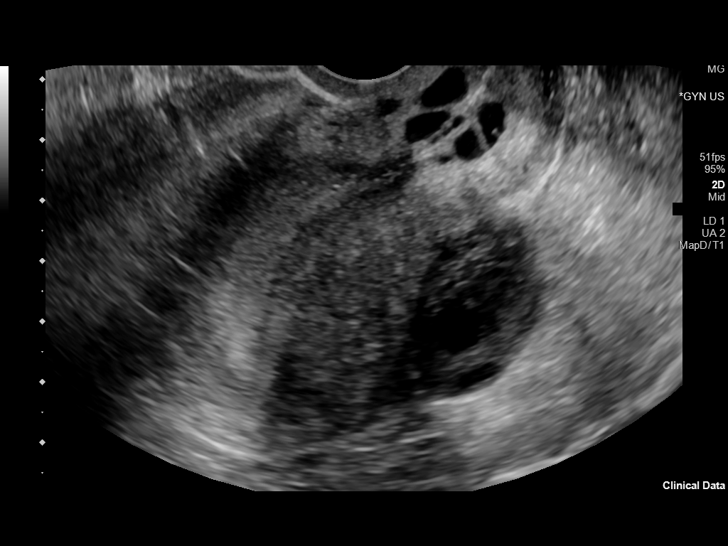
[im 28/54]
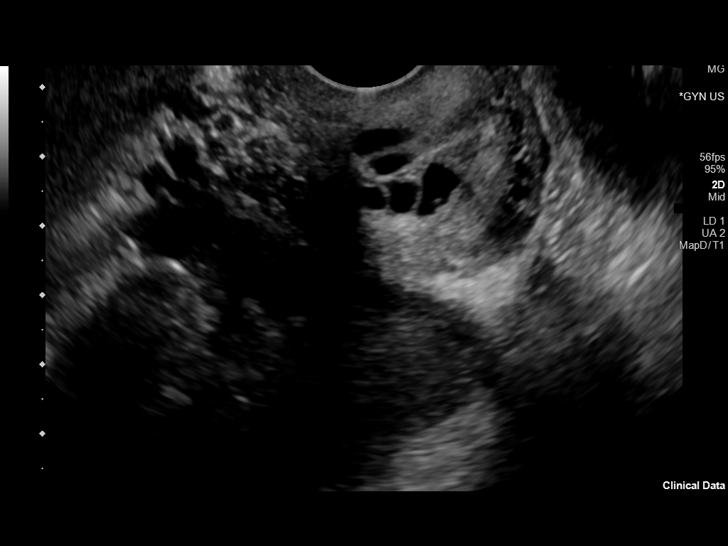
[im 32/54]
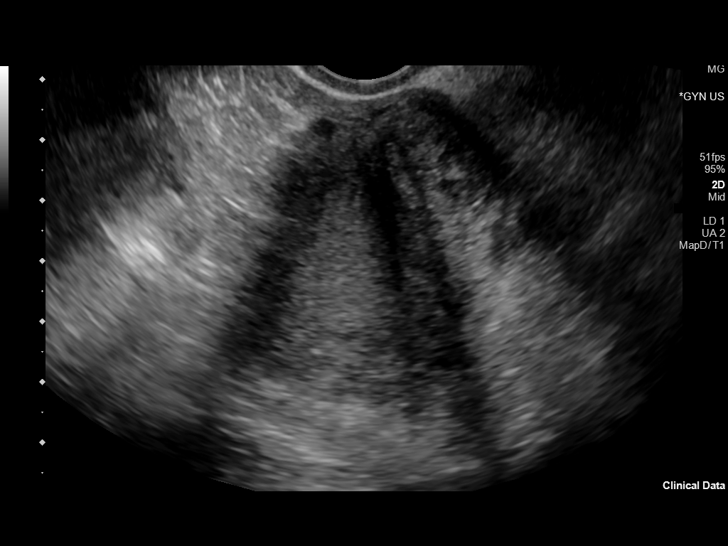
[im 36/54]
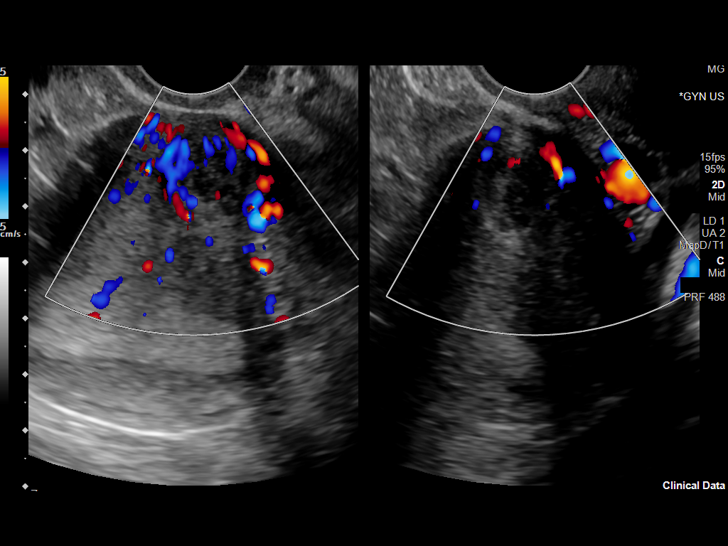
[im 40/54]
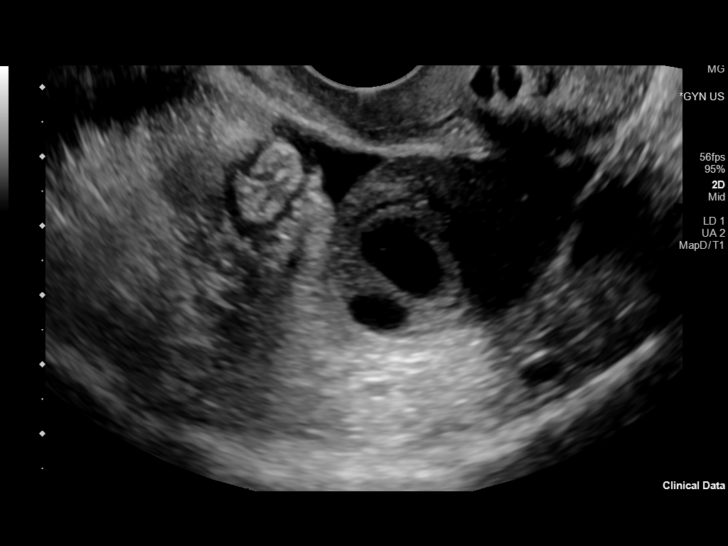
[im 44/54]
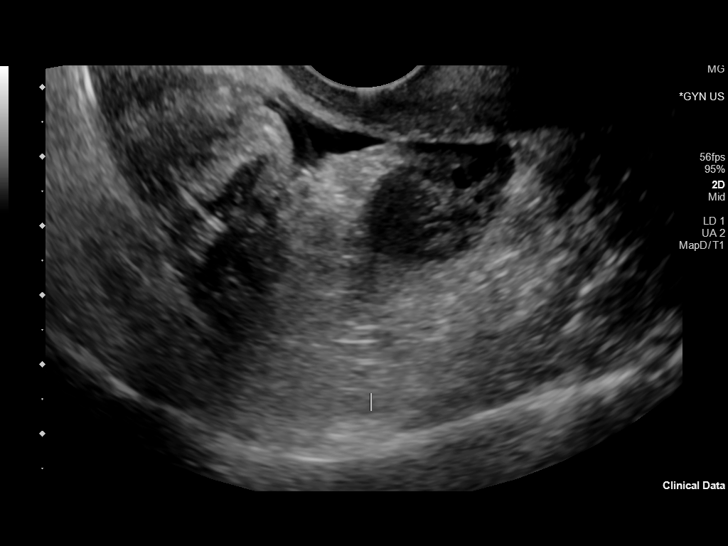
[im 48/54]
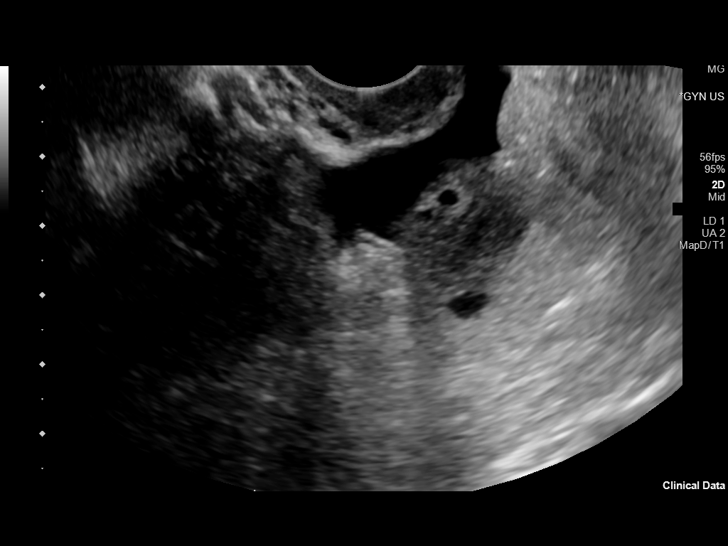
[im 52/54]
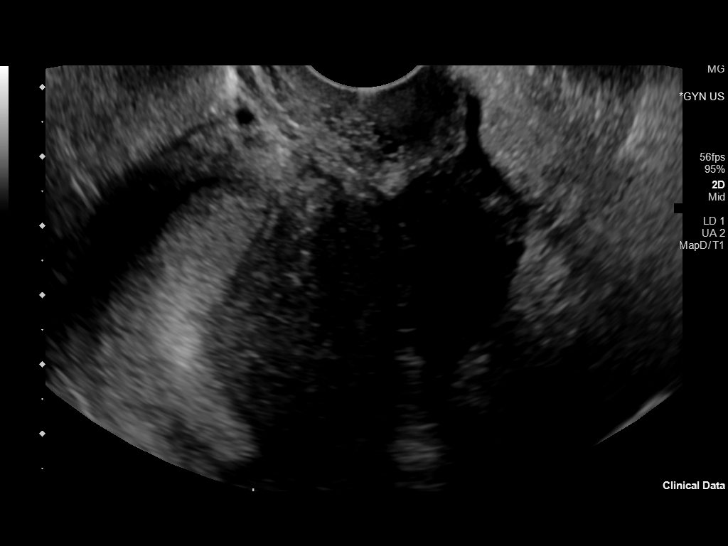

[13 of 28 positions shown; findings below may reference images not displayed]

FINDINGS: Intrauterine gestational sac: Negative.

Yolk sac:  Negative.

Embryo:  Negative.

Cardiac Activity: Negative.

Heart Rate: N/A

Subchorionic hemorrhage:  None visualized.

Maternal uterus/adnexae: Right ovary not visualized. 1.4 cm
degenerating corpus luteal cyst noted within the left ovary.
Associated small volume free fluid within the pelvis.

[DATE] x 2.1 x 2.1 cm heterogeneous intramural fibroid noted within the
left uterine body.
IMPRESSION: 1. Early pregnancy with no discrete IUP or adnexal mass identified.
Finding is consistent with a pregnancy of unknown anatomic location.
Differential considerations include IUP to early to visualize,
recent SAB, or possibly occult ectopic pregnancy. Close clinical
monitoring with serial beta HCGs and close interval follow-up
ultrasound recommended as clinically warranted.
2. Degenerating left ovarian corpus luteal cyst with associated
small volume free fluid within the pelvis.
[DATE] cm intramural fibroid at the left uterine body.

## 2022-02-22 NOTE — Discharge Instructions (Signed)
You were evaluated in the Emergency Department and after careful evaluation, we did not find any emergent condition requiring admission or further testing in the hospital. ? ?Your exam/testing today was overall reassuring.  It is too early in your pregnancy to see anything on your ultrasound.  Recommend close follow-up with your OB/GYN for repeat evaluation and ultrasound.  If you cannot follow-up with your OB/GYN within the next 1 to 2 weeks, please return to the emergency department for repeat evaluation. ? ?Please return to the Emergency Department if you experience any worsening of your condition.  Thank you for allowing [place] to be a part of your care. ? ?

## 2022-02-23 LAB — URINE CULTURE: Culture: 40000 — AB

## 2022-03-03 ENCOUNTER — Emergency Department (HOSPITAL_COMMUNITY): Payer: Medicaid Other

## 2022-03-03 ENCOUNTER — Encounter (HOSPITAL_COMMUNITY): Payer: Self-pay | Admitting: Emergency Medicine

## 2022-03-03 ENCOUNTER — Emergency Department (HOSPITAL_COMMUNITY)
Admission: EM | Admit: 2022-03-03 | Discharge: 2022-03-03 | Disposition: A | Discharge status: Home / Self Care | Payer: Medicaid Other | Attending: Emergency Medicine | Admitting: Emergency Medicine

## 2022-03-03 ENCOUNTER — Other Ambulatory Visit: Payer: Self-pay

## 2022-03-03 ENCOUNTER — Telehealth: Payer: Self-pay | Admitting: Advanced Practice Midwife

## 2022-03-03 DIAGNOSIS — O368390 Maternal care for abnormalities of the fetal heart rate or rhythm, unspecified trimester, not applicable or unspecified: Secondary | ICD-10-CM

## 2022-03-03 DIAGNOSIS — O209 Hemorrhage in early pregnancy, unspecified: Secondary | ICD-10-CM | POA: Insufficient documentation

## 2022-03-03 DIAGNOSIS — Z3A08 8 weeks gestation of pregnancy: Secondary | ICD-10-CM | POA: Insufficient documentation

## 2022-03-03 DIAGNOSIS — Z3A01 Less than 8 weeks gestation of pregnancy: Secondary | ICD-10-CM

## 2022-03-03 LAB — I-STAT BETA HCG BLOOD, ED (MC, WL, AP ONLY): I-stat hCG, quantitative: >2000 m[IU]/mL — ABNORMAL HIGH (ref <5)

## 2022-03-03 IMAGING — US US OB TRANSVAGINAL
1 series · 15 of 28 positions shown · non-contrast
Comparison: Obstetric ultrasound 9 days ago [DATE]

CLINICAL DATA: Pregnant patient with abdominal cramping.

EXAM:
OBSTETRIC <14 WK US AND TRANSVAGINAL OB US
TECHNIQUE: Both transabdominal and transvaginal ultrasound examinations were
performed for complete evaluation of the gestation as well as the
maternal uterus, adnexal regions, and pelvic cul-de-sac.
Transvaginal technique was performed to assess early pregnancy.

[Series 1: us ob comp less 14 wks mc & wl · 63 acquisitions, 15 frames shown]
[im 1/63]
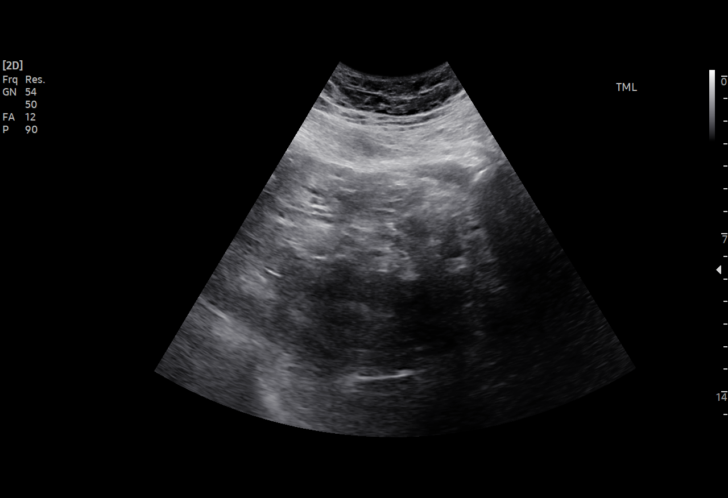
[im 5/63]
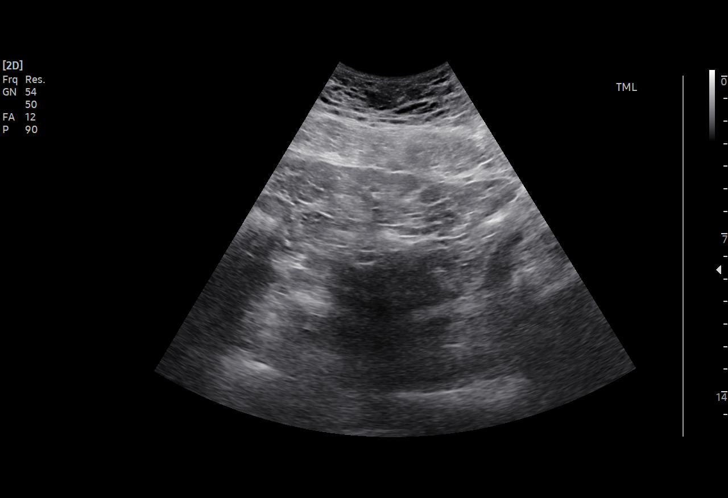
[im 10/63]
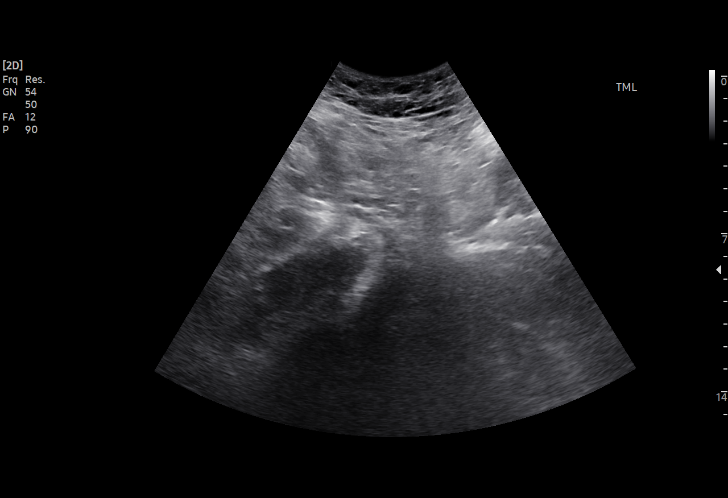
[im 14/63]
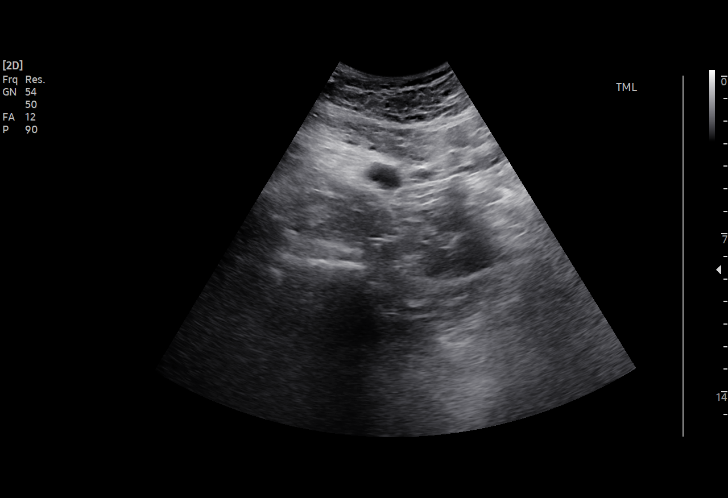
[im 19/63]
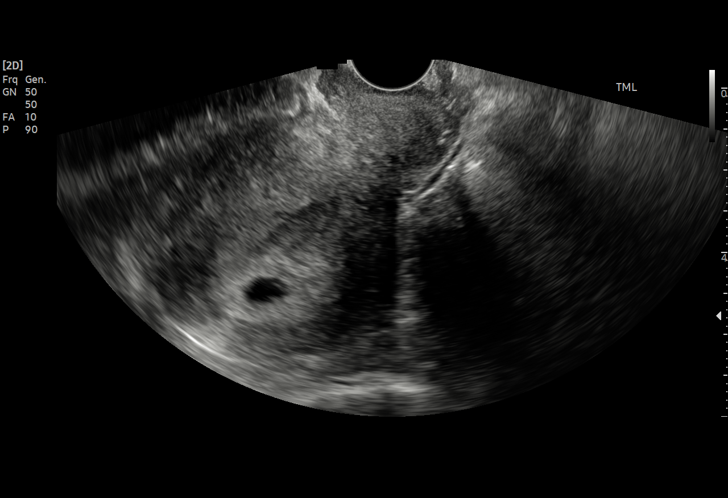
[im 23/63]
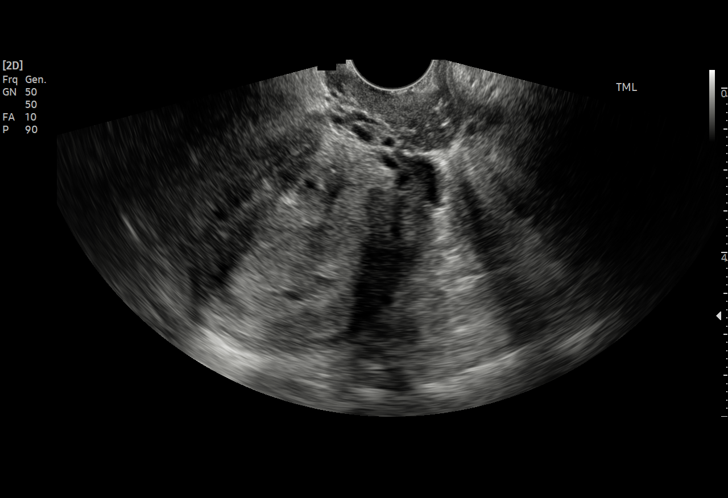
[im 28/63]
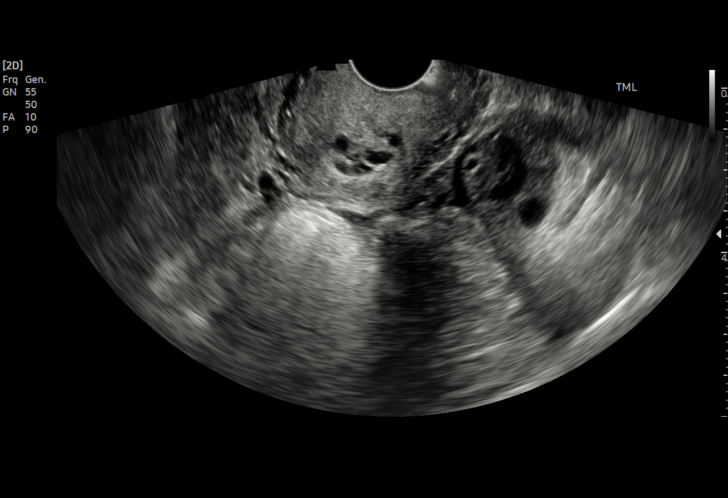
[im 33/63]
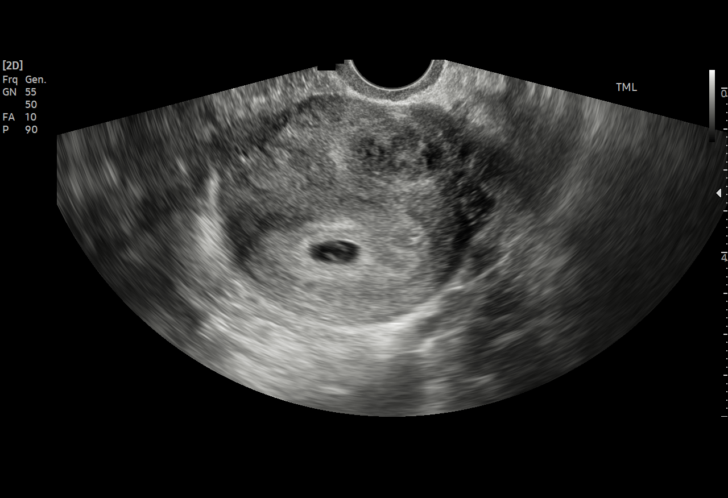
[im 35/63]
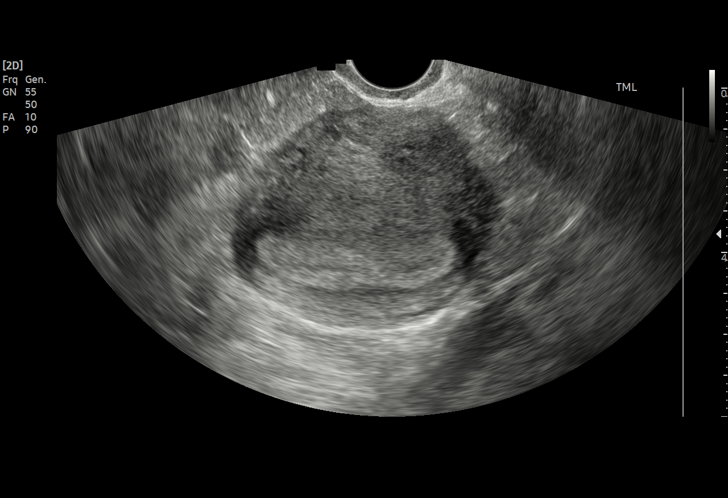
[im 40/63]
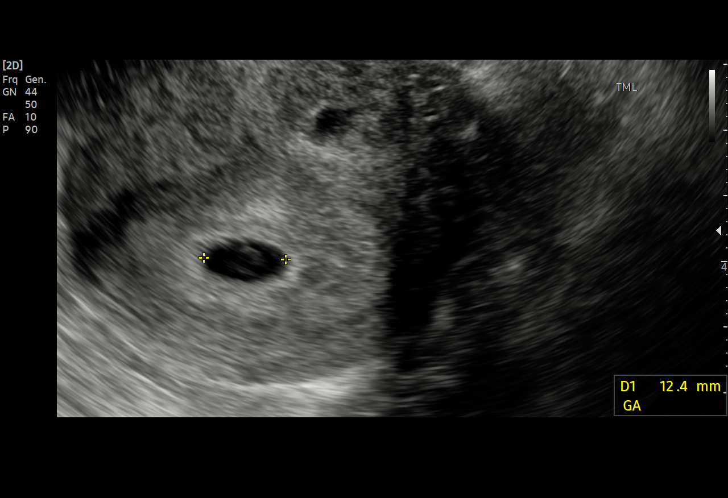
[im 44/63]
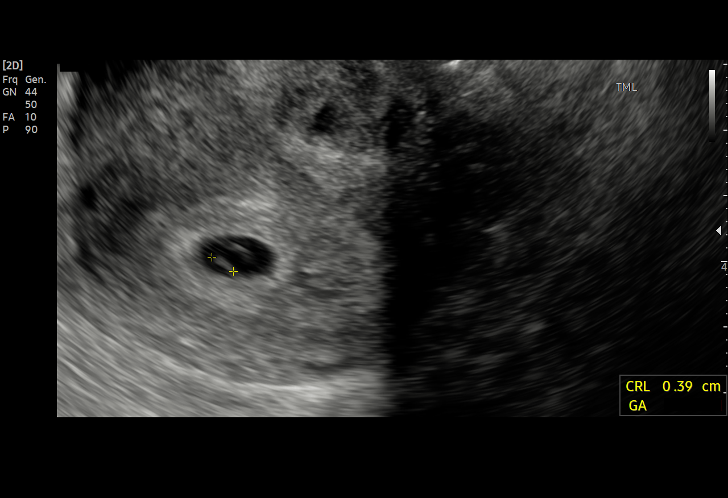
[im 49/63]
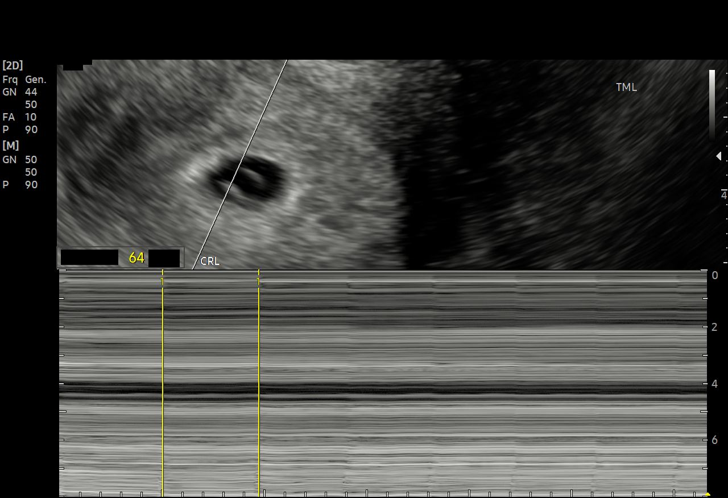
[im 53/63]
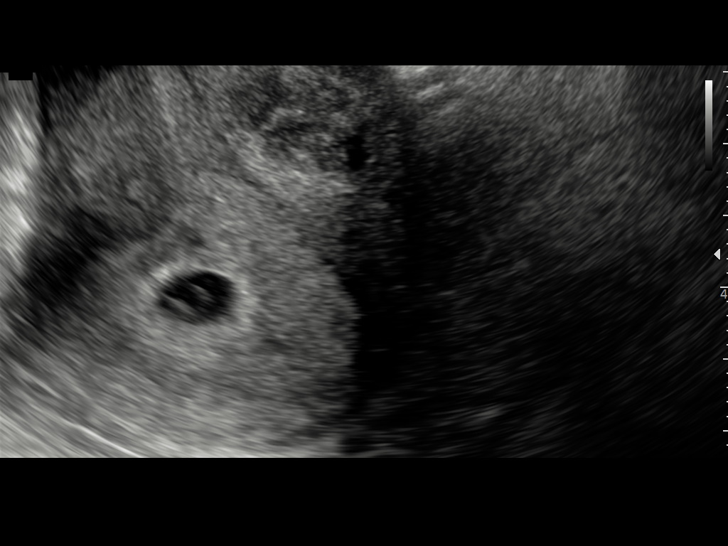
[im 58/63]
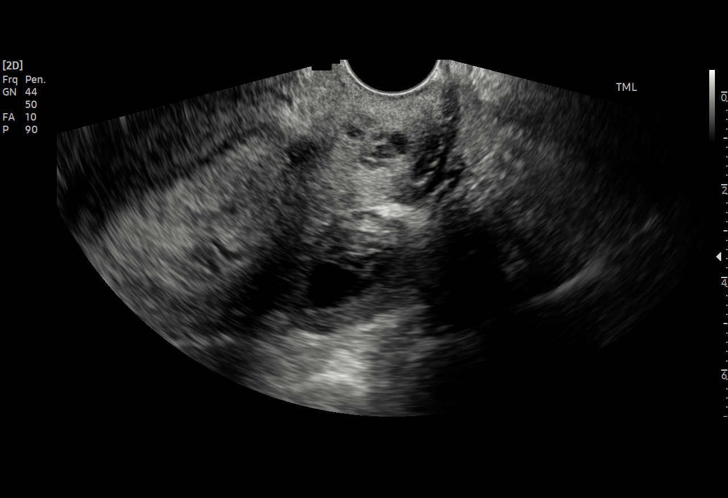
[im 63/63]
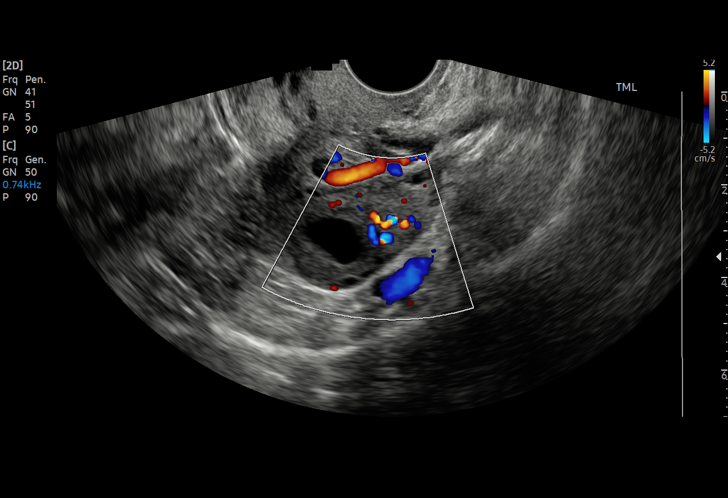

[15 of 28 positions shown; findings below may reference images not displayed]

FINDINGS: Intrauterine gestational sac: Single

Yolk sac:  Visualized.

Embryo:  Visualized.

Cardiac Activity: Visualized.

Heart Rate: 76 bpm

CRL:  4.1 mm   6 w   1 d                  US EDC: [DATE].

Subchorionic hemorrhage:  None visualized.

Maternal uterus/adnexae: The uterus is retroverted. 2.3 cm fibroid
is again noted within the anterior uterus, intramural. The right
ovary is not seen on the current exam the left ovary is physiologic
with probable corpus luteal cyst. There is no adnexal mass. No
pelvic free fluid.
IMPRESSION: 1. Single live intrauterine pregnancy estimated gestational age 6
weeks 1 day based on crown-rump length for ultrasound EDC
[DATE]. No subchorionic hemorrhage.
2. Fetal bradycardia which may be due to early gestational age,
however close clinical follow-up is recommended.
3. Uterine fibroid again seen.

## 2022-03-03 IMAGING — US US OB COMP LESS 14 WK
1 series · 15 of 28 positions shown · non-contrast
Comparison: Obstetric ultrasound 9 days ago [DATE]

CLINICAL DATA: Pregnant patient with abdominal cramping.

EXAM:
OBSTETRIC <14 WK US AND TRANSVAGINAL OB US
TECHNIQUE: Both transabdominal and transvaginal ultrasound examinations were
performed for complete evaluation of the gestation as well as the
maternal uterus, adnexal regions, and pelvic cul-de-sac.
Transvaginal technique was performed to assess early pregnancy.

[Series 1: us ob comp less 14 wks mc & wl · 63 acquisitions, 15 frames shown]
[im 1/63]
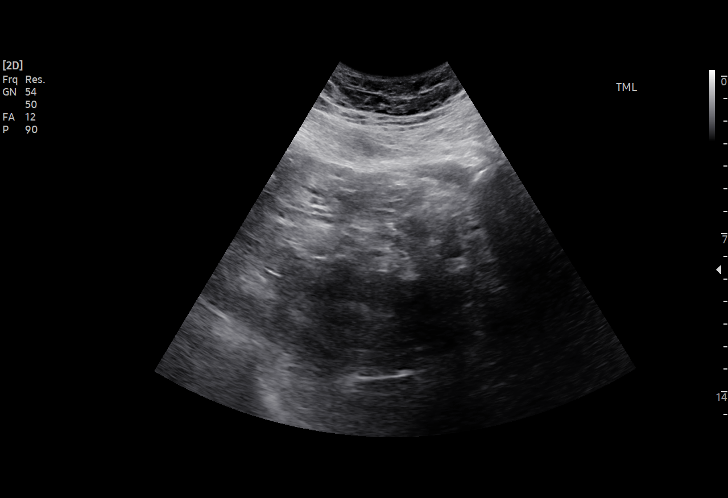
[im 5/63]
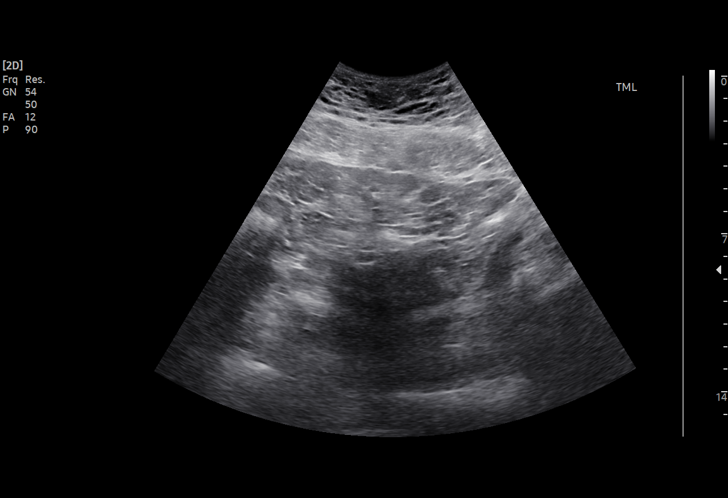
[im 10/63]
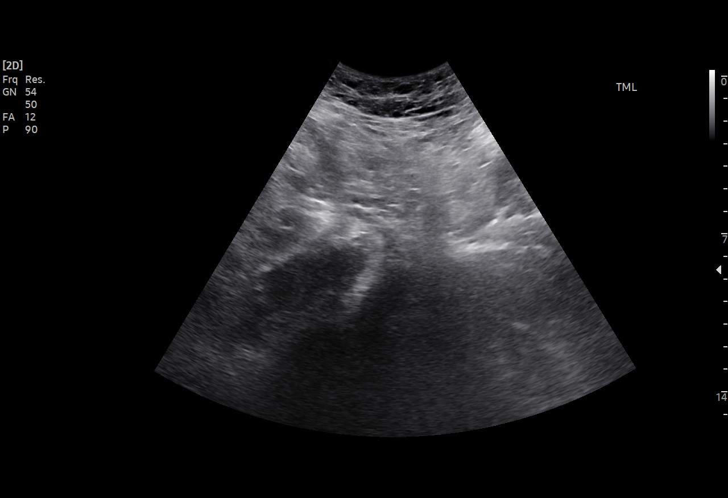
[im 14/63]
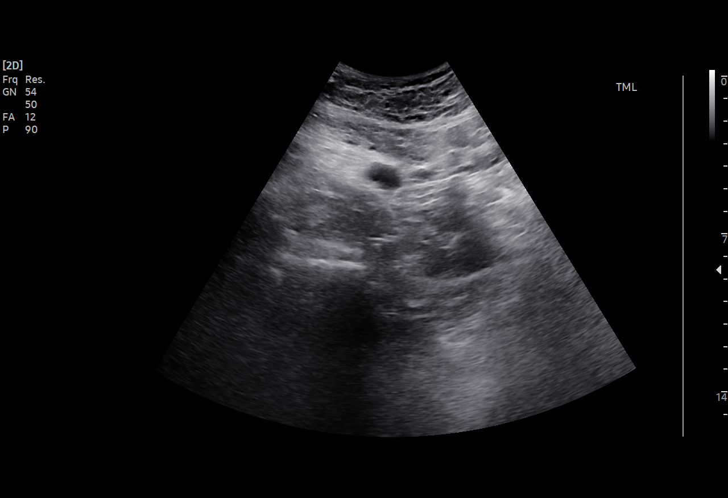
[im 19/63]
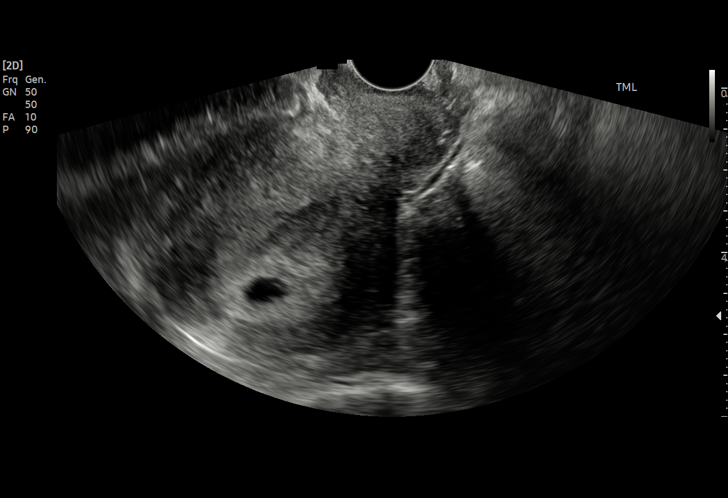
[im 23/63]
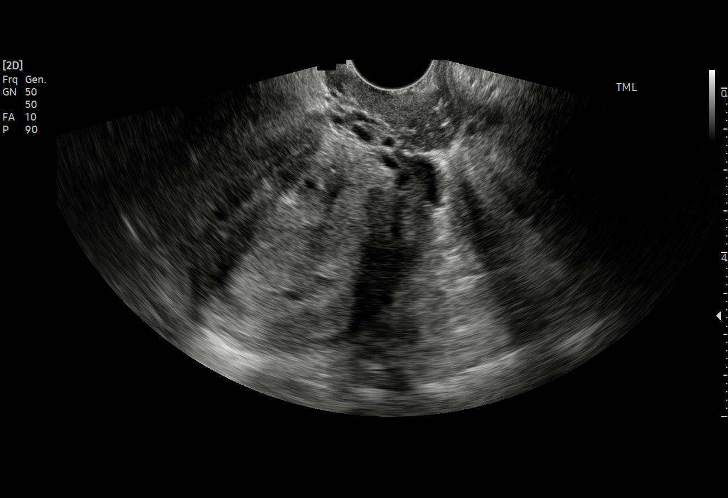
[im 28/63]
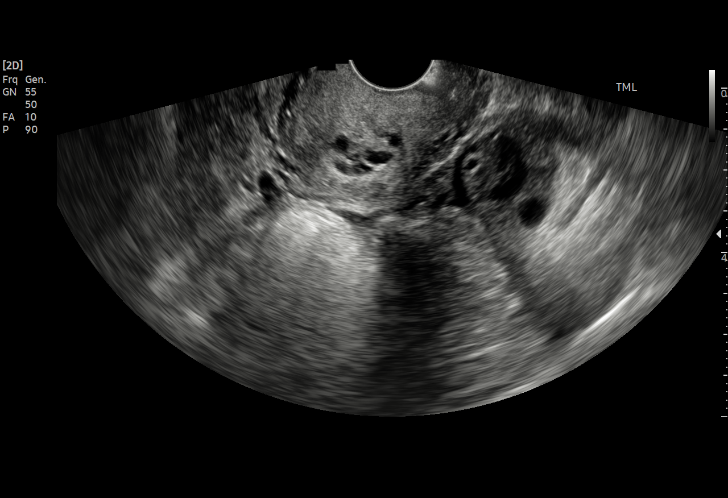
[im 33/63]
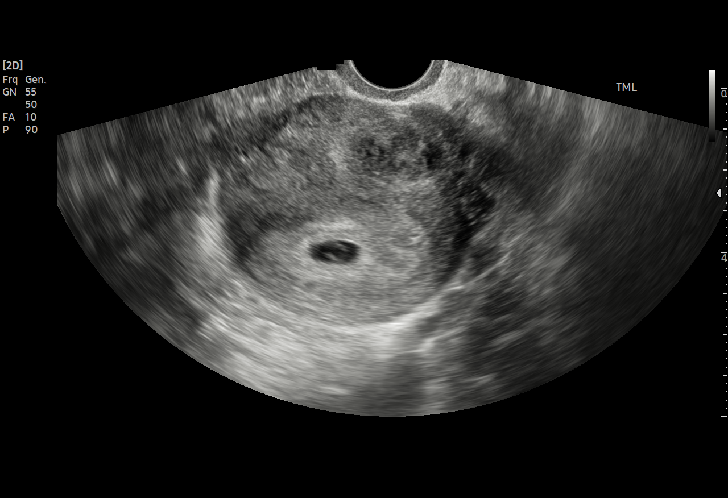
[im 35/63]
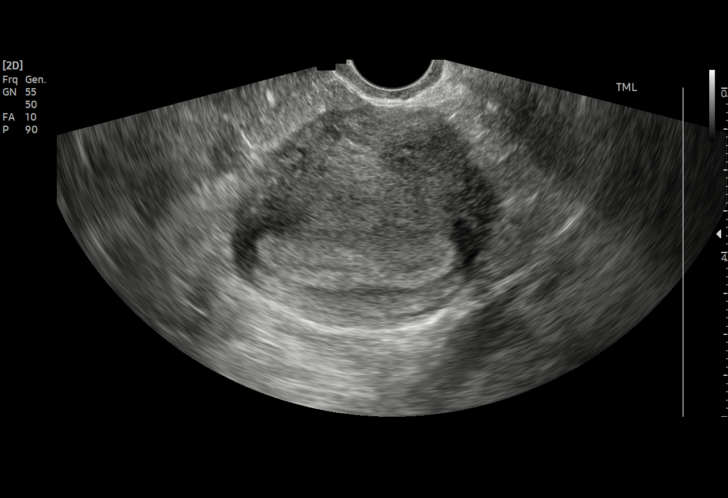
[im 40/63]
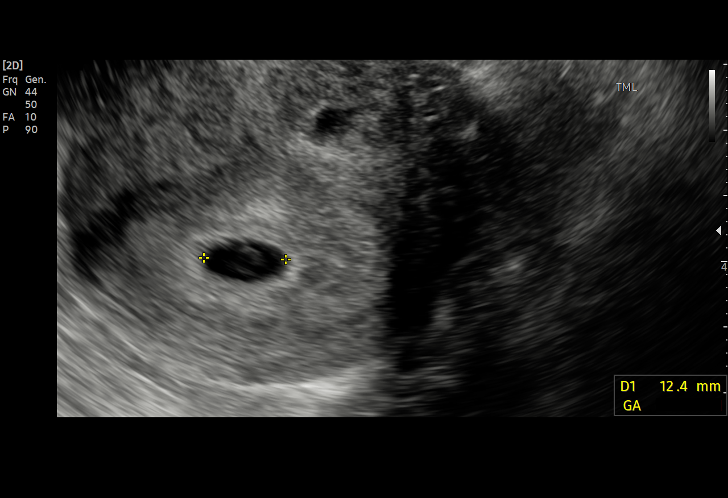
[im 44/63]
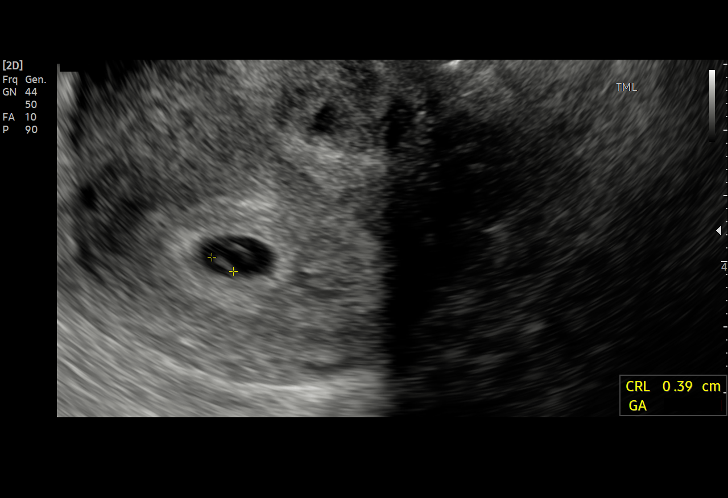
[im 49/63]
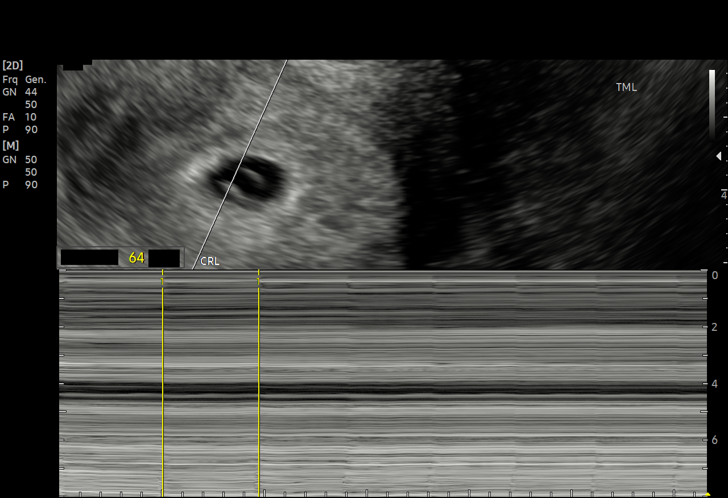
[im 53/63]
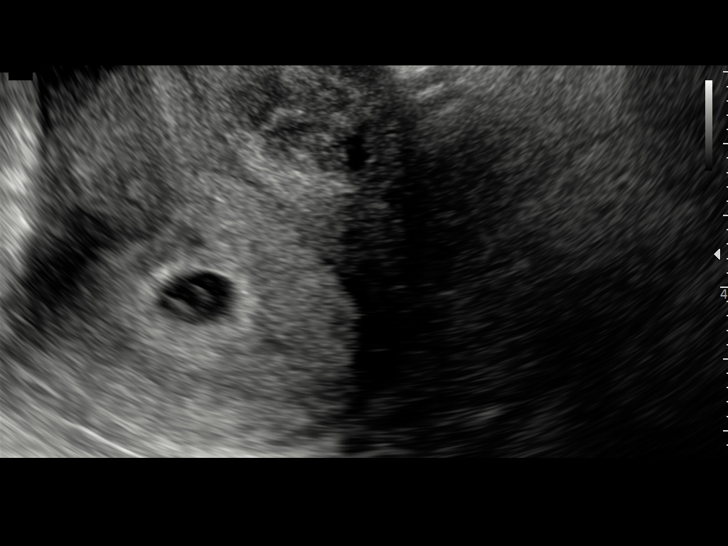
[im 58/63]
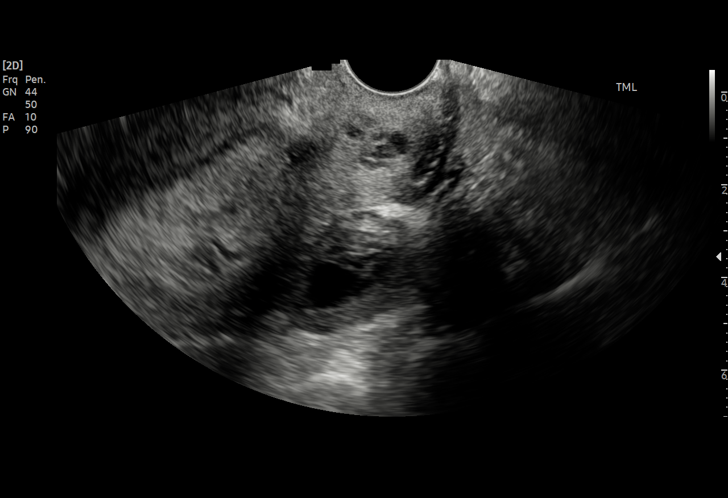
[im 63/63]
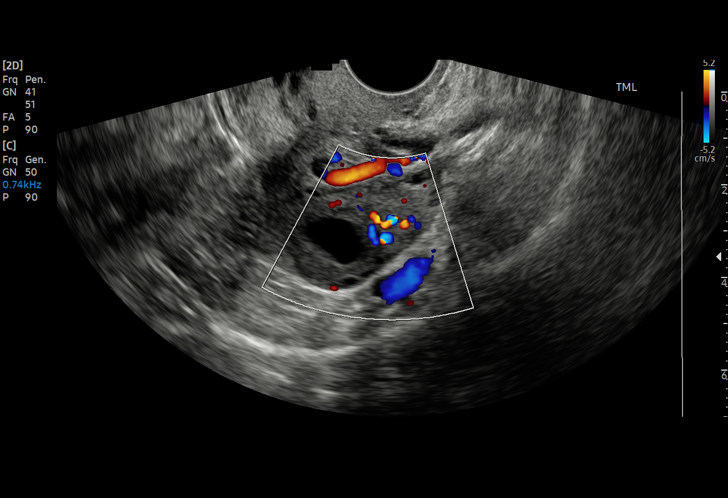

[15 of 28 positions shown; findings below may reference images not displayed]

FINDINGS: Intrauterine gestational sac: Single

Yolk sac:  Visualized.

Embryo:  Visualized.

Cardiac Activity: Visualized.

Heart Rate: 76 bpm

CRL:  4.1 mm   6 w   1 d                  US EDC: [DATE].

Subchorionic hemorrhage:  None visualized.

Maternal uterus/adnexae: The uterus is retroverted. 2.3 cm fibroid
is again noted within the anterior uterus, intramural. The right
ovary is not seen on the current exam the left ovary is physiologic
with probable corpus luteal cyst. There is no adnexal mass. No
pelvic free fluid.
IMPRESSION: 1. Single live intrauterine pregnancy estimated gestational age 6
weeks 1 day based on crown-rump length for ultrasound EDC
[DATE]. No subchorionic hemorrhage.
2. Fetal bradycardia which may be due to early gestational age,
however close clinical follow-up is recommended.
3. Uterine fibroid again seen.

## 2022-03-03 NOTE — ED Triage Notes (Signed)
Patient would like a follow up to a previous pregnancy test. ?

## 2022-03-03 NOTE — Telephone Encounter (Signed)
Contacted by [facility] for discussion of recommendations for outpatient follow-up 2/2 diagnosis of fetal bradycardia. Order placed for viability scan in 10-14 days at [facility]. Will also send a secure in basket to [facility] to coordinate repeat imaging. ? ?Clayton Bibles, MSA, MSN, CNM ?Certified Nurse Midwife, Faculty Practice ?Center for [employer], [facility] ? ?

## 2022-03-03 NOTE — ED Provider Notes (Signed)
?[facility]-EMERGENCY DEPT ?Provider Note ? ? ?CSN: [id number] ?Arrival date & time: 03/03/22  1409 ? ?  ? ?History ? ?Chief Complaint  ?Patient presents with  ? Possible Pregnancy  ? ? ?Felicia Bradley is a [id number] 31 y.o. female who presents to the emergency department requesting a follow-up on previous pregnancy test.  Patient was evaluated in the ED on 02/21/2022 for similar concerns.  Patient notes that her light vaginal bleeding stopped last night.  She has associated abdominal cramping similar to her menstrual cycle.  Denies fever, chills, dysuria, hematuria.  ? ? ?Per patient chart review: Patient was evaluated in the ED on 02/21/2022 for concerns of back pain and vaginal pain and vaginal bleeding.  She had a recent home positive pregnancy test.  In the emergency department patient i-STAT beta-hCG slightly elevated at 252.  Patient was instructed to follow-up with her OB/GYN for repeat evaluation and ultrasound in 1-2 weeks. ? ?The history is provided by the patient. No language interpreter was used.  ? ?  ? ?Home Medications ?Prior to Admission medications   ?Medication Sig Start Date End Date Taking? Authorizing Provider  ?acetaminophen (TYLENOL) 500 MG tablet Take 500 mg by mouth every 6 (six) hours as needed for moderate pain.    [provider]  ?albuterol (VENTOLIN HFA) 108 (90 Base) MCG/ACT inhaler Inhale 1 puff into the lungs 2 (two) times daily as needed for wheezing or shortness of breath.  11/10/14   [provider]  ?ALPRAZolam Prudy Feeler) 0.5 MG tablet Take 1 tablet (0.5 mg total) by mouth 3 (three) times daily as needed for anxiety. ?Patient not taking: Reported on 02/22/2022 12/21/21   Erick Blinks, MD  ?ARIPiprazole (ABILIFY) 2 MG tablet Take 2 mg by mouth daily.    [provider]  ?diclofenac Sodium (VOLTAREN) 1 % GEL Apply 2 g topically 4 (four) times daily. ?Patient not taking: Reported on 02/22/2022 06/28/20   Hall-Potvin, [place], PA-C  ?dicyclomine  (BENTYL) 20 MG tablet Take 1 tablet (20 mg total) by mouth 3 (three) times daily as needed for spasms. ?Patient not taking: Reported on 02/22/2022 09/24/21   Benjiman Core, MD  ?EPINEPHrine 0.3 mg/0.3 mL IJ SOAJ injection Inject 0.3 mg into the muscle as needed for anaphylaxis.  11/10/14   [provider]  ?ipratropium-albuterol (DUONEB) 0.5-2.5 (3) MG/3ML SOLN Take 3 mLs by nebulization every 6 (six) hours as needed (sob/wheezing). 07/19/19   [provider]  ?loperamide (IMODIUM) 2 MG capsule Take 1 capsule (2 mg total) by mouth 4 (four) times daily as needed for diarrhea or loose stools. ?Patient not taking: Reported on 12/16/2021 09/24/21   Benjiman Core, MD  ?pantoprazole (PROTONIX) 40 MG tablet Take 1 tablet (40 mg total) by mouth daily. 12/21/21 01/20/22  Erick Blinks, MD  ?predniSONE (DELTASONE) 20 MG tablet Take 2 tablets (40 mg total) by mouth daily with breakfast. ?Patient not taking: Reported on 02/22/2022 12/22/21   Erick Blinks, MD  ?   ? ?Allergies    ?Contrast media [iodinated contrast media], Dilaudid [hydromorphone], Tramadol, Apple juice, Banana, Coconut oil, Dexamethasone, Other, Solu-medrol [methylprednisolone], and Toradol [ketorolac tromethamine]   ? ?Review of Systems   ?Review of Systems  ?Constitutional:  Negative for chills and fever.  ?Gastrointestinal:  Negative for abdominal pain, nausea and vomiting.  ?Genitourinary:  Negative for dysuria and hematuria.  ?All other systems reviewed and are negative. ? ?Physical Exam ?Updated Vital Signs ?BP 137/83 (BP Location: Left Arm)   Pulse 97  Temp 99.4 ?F (37.4 ?C) (Oral)   Resp 19   SpO2 98%  ?Physical Exam ?Vitals and nursing note reviewed.  ?Constitutional:   ?   General: She is not in acute distress. ?   Appearance: She is not diaphoretic.  ?HENT:  ?   Head: Normocephalic and atraumatic.  ?   Mouth/Throat:  ?   Pharynx: No oropharyngeal exudate.  ?Eyes:  ?   General: No scleral icterus. ?   Conjunctiva/sclera:  Conjunctivae normal.  ?Cardiovascular:  ?   Rate and Rhythm: Normal rate and regular rhythm.  ?   Pulses: Normal pulses.  ?   Heart sounds: Normal heart sounds.  ?Pulmonary:  ?   Effort: Pulmonary effort is normal. No respiratory distress.  ?   Breath sounds: Normal breath sounds. No wheezing.  ?Abdominal:  ?   General: Bowel sounds are normal.  ?   Palpations: Abdomen is soft. There is no mass.  ?   Tenderness: There is no abdominal tenderness. There is no guarding or rebound.  ?Musculoskeletal:     ?   General: Normal range of motion.  ?   Cervical back: Normal range of motion and neck supple.  ?Skin: ?   General: Skin is warm and dry.  ?Neurological:  ?   Mental Status: She is alert.  ?Psychiatric:     ?   Behavior: Behavior normal.  ? ? ?ED Results / Procedures / Treatments   ?Labs ?(all labs ordered are listed, but only abnormal results are displayed) ?Labs Reviewed  ?I-STAT BETA HCG BLOOD, ED ([facility], [facility], [facility] ONLY) - Abnormal; Notable for the following components:  ?    Result Value  ? I-stat hCG, quantitative >2,000.0 (*)   ? All other components within normal limits  ? ? ?EKG ?None ? ?Radiology ?US OB Comp Less 14 Wks ? ?Result Date: 03/03/2022 ?CLINICAL DATA:  Pregnant patient with abdominal cramping. EXAM: OBSTETRIC <14 WK [place] AND TRANSVAGINAL OB US TECHNIQUE: Both transabdominal and transvaginal ultrasound examinations were performed for complete evaluation of the gestation as well as the maternal uterus, adnexal regions, and pelvic cul-de-sac. Transvaginal technique was performed to assess early pregnancy. COMPARISON:  Obstetric ultrasound 9 days ago 02/22/2022 FINDINGS: Intrauterine gestational sac: Single Yolk sac:  Visualized. Embryo:  Visualized. Cardiac Activity: Visualized. Heart Rate: 76 bpm CRL:  4.1 mm   6 w   1 d                  [place] EDC: 10/27/2022. Subchorionic hemorrhage:  None visualized. Maternal uterus/adnexae: The uterus is retroverted. 2.3 cm fibroid is again noted within the anterior uterus,  intramural. The right ovary is not seen on the current exam the left ovary is physiologic with probable corpus luteal cyst. There is no adnexal mass. No pelvic free fluid. IMPRESSION: 1. Single live intrauterine pregnancy estimated gestational age [redacted] weeks 1 day based on crown-rump length for ultrasound [facility] 10/27/2022. No subchorionic hemorrhage. 2. Fetal bradycardia which may be due to early gestational age, however close clinical follow-up is recommended. 3. Uterine fibroid again seen. Electronically Signed   By: Narda Rutherford M.D.   On: 03/03/2022 18:58  ? ?US OB Transvaginal ? ?Result Date: 03/03/2022 ?CLINICAL DATA:  Pregnant patient with abdominal cramping. EXAM: OBSTETRIC <14 WK [place] AND TRANSVAGINAL OB US TECHNIQUE: Both transabdominal and transvaginal ultrasound examinations were performed for complete evaluation of the gestation as well as the maternal uterus, adnexal regions, and pelvic cul-de-sac. Transvaginal technique was performed to  assess early pregnancy. COMPARISON:  Obstetric ultrasound 9 days ago 02/22/2022 FINDINGS: Intrauterine gestational sac: Single Yolk sac:  Visualized. Embryo:  Visualized. Cardiac Activity: Visualized. Heart Rate: 76 bpm CRL:  4.1 mm   6 w   1 d                  US EDC: 10/27/2022. Subchorionic hemorrhage:  None visualized. Maternal uterus/adnexae: The uterus is retroverted. 2.3 cm fibroid is again noted within the anterior uterus, intramural. The right ovary is not seen on the current exam the left ovary is physiologic with probable corpus luteal cyst. There is no adnexal mass. No pelvic free fluid. IMPRESSION: 1. Single live intrauterine pregnancy estimated gestational age [redacted] weeks 1 day based [facility] No subchorionic hemorrhage. 2. Fetal bradycardia which may be due to early gestational age, however close clinical follow-up is recommended. 3. Uterine fibroid again seen. Electronically Signed   By: Narda RutherfordMelanie  Sanford M.D.   On:  03/03/2022 18:58   ? ?Procedures ?Procedures  ? ? ?Medications Ordered in ED ?Medications - No data to display ? ?ED Course/ Medical Decision Making/ A&P ?Clinical Course as of 03/03/22 1935  ?Tue Mar 03, 2022  ?18

## 2022-03-03 NOTE — Discharge Instructions (Addendum)
It was a pleasure taking care of you today!  ? ?Your hCG was elevated >2,000 today.  Your ultrasound in the ED showed a single intrauterine pregnancy.  You will be called by this Friday, 03/06/2022 to set up a repeat ultrasound to be completed by 03/12/2022.  It is important that you maintain follow-up for this repeat ultrasound to ensure that the fetuses heart rate is improving and increasing.  Attached is information for the [facility] at [facility] for women, you may call and set up a follow-up appointment with them regarding today's ED visit.  It is important that you start a prenatal vitamin, you may pick up prenatal vitamins over-the-counter and start taking them daily as prescribed.  Ensure that you are not taking any NSAIDs at this time. You may follow-up with your primary care provider as needed.  Return to emergency department for experiencing increasing/worsening vaginal bleeding, abdominal pain, fever, worsening symptoms. ?

## 2022-03-05 ENCOUNTER — Encounter: Payer: Self-pay | Admitting: *Deleted

## 2022-03-05 ENCOUNTER — Telehealth: Payer: Self-pay | Admitting: *Deleted

## 2022-03-05 NOTE — Telephone Encounter (Signed)
-----   Message from Aggie Hacker sent at 03/04/2022  8:53 AM EDT ----- ?Regarding: FW: Viability Scan ? ?----- Message ----- ?From: Calvert Cantor, CNM ?Sent: 03/03/2022   7:21 PM EDT ?To: [facility] Admin Pool ?Subject: Viability Scan                                ? ?Patient at [facility], needs viability scan in 10-14 days. Please contact patient to schedule. Thanks! SW ? ? ?

## 2022-03-05 NOTE — Telephone Encounter (Signed)
Per request provider called and scheduled [place] for  appointment for 03/17/22 at 2:00. I called [facility] mobile number to notify and left message I am calling to inform you we have schedule you an [place] appointment, I left date/time /location. I also called her home number and heard a message call cannot be completed at this time. I will also send MyChart message. ?Felicia Bradley ?

## 2022-03-06 NOTE — Telephone Encounter (Signed)
Per chart review, patient has read MyChart message with [place] details.  ?

## 2022-03-17 ENCOUNTER — Ambulatory Visit
Admission: RE | Admit: 2022-03-17 | Discharge: 2022-03-17 | Disposition: A | Discharge status: Home / Self Care | Payer: Medicaid Other | Source: Ambulatory Visit | Attending: Advanced Practice Midwife | Admitting: Advanced Practice Midwife

## 2022-03-17 DIAGNOSIS — O368390 Maternal care for abnormalities of the fetal heart rate or rhythm, unspecified trimester, not applicable or unspecified: Secondary | ICD-10-CM | POA: Diagnosis present

## 2022-03-25 ENCOUNTER — Encounter: Payer: Self-pay | Admitting: Cardiovascular Disease

## 2023-03-13 ENCOUNTER — Emergency Department (HOSPITAL_BASED_OUTPATIENT_CLINIC_OR_DEPARTMENT_OTHER)
Admission: EM | Admit: 2023-03-13 | Discharge: 2023-03-13 | Disposition: A | Discharge status: Home / Self Care | Payer: Medicaid Other | Attending: Emergency Medicine | Admitting: Emergency Medicine

## 2023-03-13 ENCOUNTER — Encounter (HOSPITAL_BASED_OUTPATIENT_CLINIC_OR_DEPARTMENT_OTHER): Payer: Self-pay | Admitting: Emergency Medicine

## 2023-03-13 ENCOUNTER — Other Ambulatory Visit: Payer: Self-pay

## 2023-03-13 DIAGNOSIS — R599 Enlarged lymph nodes, unspecified: Secondary | ICD-10-CM | POA: Insufficient documentation

## 2023-03-13 DIAGNOSIS — E119 Type 2 diabetes mellitus without complications: Secondary | ICD-10-CM | POA: Diagnosis not present

## 2023-03-13 DIAGNOSIS — Z1152 Encounter for screening for COVID-19: Secondary | ICD-10-CM | POA: Diagnosis not present

## 2023-03-13 DIAGNOSIS — I10 Essential (primary) hypertension: Secondary | ICD-10-CM | POA: Insufficient documentation

## 2023-03-13 DIAGNOSIS — J45909 Unspecified asthma, uncomplicated: Secondary | ICD-10-CM | POA: Diagnosis not present

## 2023-03-13 HISTORY — DX: Cellulitis, unspecified: L03.90

## 2023-03-13 HISTORY — DX: Unspecified pre-eclampsia, complicating the puerperium: O14.95

## 2023-03-13 LAB — RESP PANEL BY RT-PCR (RSV, FLU A&B, COVID)  RVPGX2
Influenza A by PCR: NEGATIVE
Influenza B by PCR: NEGATIVE
Resp Syncytial Virus by PCR: NEGATIVE
SARS Coronavirus 2 by RT PCR: NEGATIVE

## 2023-03-13 LAB — GROUP A STREP BY PCR: Group A Strep by PCR: NOT DETECTED

## 2023-03-13 NOTE — ED Provider Notes (Signed)
[facility] Provider Note   CSN: [id number] Arrival date & time: 03/13/23  1636     History  Chief Complaint  Patient presents with   Mass    Felicia Bradley is a 32 y.o. female history of asthma, diabetes, hypertension presents today for evaluation of swollen lymph node.  Patient states she noticed 2 small swelling nodules behind the right ear followed by nodule in her right cheek.  She denies any difficulty with eating or swallowing.  Denies any fever.  States the nodules have increased in size with increased pain.  Denies any blood or drainage.  Endorses runny nose.  She checked her temperature today was 99.  Denies any fever, cough, nausea, vomiting, chest pain, shortness of breath, bowel changes, urinary symptoms.  HPI  Past Medical History:  Diagnosis Date   Asthma    Bipolar disorder ([place])    Cellulitis    Diabetes mellitus without complication ([place])    type 2   DVT (deep venous thrombosis) ([place]) 2015   w/ recannulation per Care Everywhere   Hypertension    Obesity    Preeclampsia in postpartum period    Pulmonary embolism ([place]) 2015   Past Surgical History:  Procedure Laterality Date   CESAREAN SECTION     CESAREAN SECTION       Home Medications Prior to Admission medications   Medication Sig Start Date End Date Taking? Authorizing Provider  acetaminophen (TYLENOL) 500 MG tablet Take 500 mg by mouth every 6 (six) hours as needed for moderate pain.    [provider]  albuterol (VENTOLIN HFA) 108 (90 Base) MCG/ACT inhaler Inhale 1 puff into the lungs 2 (two) times daily as needed for wheezing or shortness of breath.  11/10/14   [provider]  ALPRAZolam [name]) 0.5 MG tablet Take 1 tablet (0.5 mg total) by mouth 3 (three) times daily as needed for anxiety. Patient not taking: Reported on 02/22/2022 12/21/21   Kathie Dike, MD  ARIPiprazole (ABILIFY) 2 MG tablet Take 2 mg by mouth daily.    [provider]  diclofenac Sodium (VOLTAREN) 1 % GEL Apply 2 g topically 4 (four) times daily. Patient not taking: Reported on 02/22/2022 06/28/20   Hall-Potvin, [place], PA-C  dicyclomine (BENTYL) 20 MG tablet Take 1 tablet (20 mg total) by mouth 3 (three) times daily as needed for spasms. Patient not taking: Reported on 02/22/2022 09/24/21   [name], MD  EPINEPHrine 0.3 mg/0.3 mL IJ SOAJ injection Inject 0.3 mg into the muscle as needed for anaphylaxis.  11/10/14   [provider]  ipratropium-albuterol (DUONEB) 0.5-2.5 (3) MG/3ML SOLN Take 3 mLs by nebulization every 6 (six) hours as needed (sob/wheezing). 07/19/19   [provider]  loperamide (IMODIUM) 2 MG capsule Take 1 capsule (2 mg total) by mouth 4 (four) times daily as needed for diarrhea or loose stools. Patient not taking: Reported on 12/16/2021 09/24/21   [name], MD  pantoprazole (PROTONIX) 40 MG tablet Take 1 tablet (40 mg total) by mouth daily. 12/21/21 01/20/22  Kathie Dike, MD  predniSONE (DELTASONE) 20 MG tablet Take 2 tablets (40 mg total) by mouth daily with breakfast. Patient not taking: Reported on 02/22/2022 12/22/21   Kathie Dike, MD      Allergies    Contrast media [iodinated contrast media], Dilaudid [hydromorphone], Tramadol, Apple juice, Banana, Coconut (cocos nucifera), Dexamethasone, Other, Solu-medrol [methylprednisolone], and Toradol [ketorolac tromethamine]    Review of Systems  Review of Systems Negative except as per HPI.  Physical Exam Updated Vital Signs BP (!) 157/93 (BP Location: Right Arm)   Pulse (!) 102   Temp 99 F (37.2 C) (Oral)   Resp 18   Ht 5\' 3"  (1.6 m)   Wt 117.5 kg   LMP 02/15/2023   SpO2 98%   BMI 45.88 kg/m  Physical Exam Vitals and nursing note reviewed.  Constitutional:      Appearance: Normal appearance.  HENT:     Head: Normocephalic and atraumatic.     Mouth/Throat:     Mouth: Mucous membranes are moist.  Eyes:     General: No  scleral icterus. Cardiovascular:     Rate and Rhythm: Normal rate and regular rhythm.     Pulses: Normal pulses.     Heart sounds: Normal heart sounds.  Pulmonary:     Effort: Pulmonary effort is normal.     Breath sounds: Normal breath sounds.  Abdominal:     General: Abdomen is flat.     Palpations: Abdomen is soft.     Tenderness: There is no abdominal tenderness.  Musculoskeletal:        General: No deformity.  Skin:    General: Skin is warm.     Findings: No rash.     Comments: Two 1cm nodules behind R ear, one 1cm nodule on cheek near R ear.  Neurological:     General: No focal deficit present.     Mental Status: She is alert.  Psychiatric:        Mood and Affect: Mood normal.     ED Results / Procedures / Treatments   Labs (all labs ordered are listed, but only abnormal results are displayed) Labs Reviewed  RESP PANEL BY RT-PCR (RSV, FLU A&B, COVID)  RVPGX2  GROUP A STREP BY PCR    EKG None  Radiology No results found.  Procedures Procedures    Medications Ordered in ED Medications - No data to display  ED Course/ Medical Decision Making/ A&P                             Medical Decision Making  This patient presents to the ED for runny nose, swelling nodules but high right ear, this involves an extensive number of treatment options, and is a complaint that carries with a high risk of complications and morbidity.  The differential diagnosis includes flu, COVID, RSV, strep, pharyngitis, bronchitis, pneumonia, lymphadenopathy, infectious etiology.  This is not an exhaustive list.  Lab tests: I ordered and personally interpreted labs.  The pertinent results include: Viral panel negative.  Strep negative.  Imaging studies:  Problem list/ ED course/ Critical interventions/ Medical management: HPI: See above Vital signs within normal range and stable throughout visit. Laboratory/imaging studies significant for: See above. On physical examination,  patient is afebrile and appears in no acute distress. This patient presents with symptoms suspicious for viral upper respiratory infection with lymphadenopathy. Based on history and physical doubt sinusitis. COVID test was negative. Do not suspect underlying cardiopulmonary process. I considered, but think unlikely, pneumonia. Patient is nontoxic appearing and not in need of emergent medical intervention.  Advised patient to take Tylenol, ibuprofen and cold/heat compression for symptom relief, follow-up with primary care physician for further evaluation and management.  Return to the ER if new or worsening symptoms. I have reviewed the patient home medicines and have made adjustments as needed.  Cardiac  monitoring/EKG: The patient was maintained on a cardiac monitor.  I personally reviewed and interpreted the cardiac monitor which showed an underlying rhythm of: sinus rhythm.  Additional history obtained: External records from outside source obtained and reviewed including: Chart review including previous notes, labs, imaging.  Consultations obtained:  Disposition Continued outpatient therapy. Follow-up with PCP recommended for reevaluation of symptoms. Treatment plan discussed with patient.  Pt acknowledged understanding was agreeable to the plan. Worrisome signs and symptoms were discussed with patient, and patient acknowledged understanding to return to the ED if they noticed these signs and symptoms. Patient was stable upon discharge.   This chart was dictated using voice recognition software.  Despite best efforts to proofread,  errors can occur which can change the documentation meaning.           Final Clinical Impression(s) / ED Diagnoses Final diagnoses:  Swelling of lymph nodes    Rx / DC Orders ED Discharge Orders     None         Rex Kras, [place] 03/13/23 2100    Tegeler, Gwenyth Allegra, MD 03/13/23 2322

## 2023-03-13 NOTE — Discharge Instructions (Addendum)
You tested negative for COVID, flu, RSV, strep today.  Please take tylenol/ibuprofen for pain, use cold or heat compression for symptom relief.  I recommend close follow-up with PCP for reevaluation.  Please do not hesitate to return to emergency department if worrisome signs symptoms we discussed become apparent.

## 2023-03-13 NOTE — ED Triage Notes (Signed)
Pt reports painful nodules to RT side neck and face x 4d

## 2023-05-29 ENCOUNTER — Emergency Department (HOSPITAL_BASED_OUTPATIENT_CLINIC_OR_DEPARTMENT_OTHER)
Admission: EM | Admit: 2023-05-29 | Discharge: 2023-05-30 | Disposition: A | Discharge status: Home / Self Care | Payer: Medicaid Other | Attending: Emergency Medicine | Admitting: Emergency Medicine

## 2023-05-29 ENCOUNTER — Encounter (HOSPITAL_BASED_OUTPATIENT_CLINIC_OR_DEPARTMENT_OTHER): Payer: Self-pay | Admitting: Emergency Medicine

## 2023-05-29 ENCOUNTER — Other Ambulatory Visit: Payer: Self-pay

## 2023-05-29 DIAGNOSIS — J45909 Unspecified asthma, uncomplicated: Secondary | ICD-10-CM | POA: Insufficient documentation

## 2023-05-29 DIAGNOSIS — I10 Essential (primary) hypertension: Secondary | ICD-10-CM | POA: Diagnosis not present

## 2023-05-29 DIAGNOSIS — E119 Type 2 diabetes mellitus without complications: Secondary | ICD-10-CM | POA: Insufficient documentation

## 2023-05-29 DIAGNOSIS — H6501 Acute serous otitis media, right ear: Secondary | ICD-10-CM | POA: Diagnosis not present

## 2023-05-29 DIAGNOSIS — H60391 Other infective otitis externa, right ear: Secondary | ICD-10-CM

## 2023-05-29 DIAGNOSIS — Z79899 Other long term (current) drug therapy: Secondary | ICD-10-CM | POA: Insufficient documentation

## 2023-05-29 DIAGNOSIS — H9201 Otalgia, right ear: Secondary | ICD-10-CM | POA: Diagnosis present

## 2023-05-29 NOTE — ED Provider Notes (Signed)
[facility] Provider Note   CSN: [id number] Arrival date & time: 05/29/23  2339     History  Chief Complaint  Patient presents with   Otalgia    Felicia Bradley is a 32 y.o. female.  With PMH of asthma, HTN, DM, history of PE and DVT not on active anticoagulation, bipolar disorder, migraine who presents with right-sided ear pain with drainage for 6 days.   Otalgia      Home Medications Prior to Admission medications   Medication Sig Start Date End Date Taking? Authorizing Provider  acetaminophen (TYLENOL) 500 MG tablet Take 500 mg by mouth every 6 (six) hours as needed for moderate pain.    [provider]  albuterol (VENTOLIN HFA) 108 (90 Base) MCG/ACT inhaler Inhale 1 puff into the lungs 2 (two) times daily as needed for wheezing or shortness of breath.  11/10/14   [provider]  ALPRAZolam Prudy Feeler) 0.5 MG tablet Take 1 tablet (0.5 mg total) by mouth 3 (three) times daily as needed for anxiety. Patient not taking: Reported on 02/22/2022 12/21/21   Erick Blinks, MD  ARIPiprazole (ABILIFY) 2 MG tablet Take 2 mg by mouth daily.    [provider]  diclofenac Sodium (VOLTAREN) 1 % GEL Apply 2 g topically 4 (four) times daily. Patient not taking: Reported on 02/22/2022 06/28/20   Hall-Potvin, [place], PA-C  dicyclomine (BENTYL) 20 MG tablet Take 1 tablet (20 mg total) by mouth 3 (three) times daily as needed for spasms. Patient not taking: Reported on 02/22/2022 09/24/21   Benjiman Core, MD  EPINEPHrine 0.3 mg/0.3 mL IJ SOAJ injection Inject 0.3 mg into the muscle as needed for anaphylaxis.  11/10/14   [provider]  ipratropium-albuterol (DUONEB) 0.5-2.5 (3) MG/3ML SOLN Take 3 mLs by nebulization every 6 (six) hours as needed (sob/wheezing). 07/19/19   [provider]  loperamide (IMODIUM) 2 MG capsule Take 1 capsule (2 mg total) by mouth 4 (four) times daily as needed for diarrhea or loose  stools. Patient not taking: Reported on 12/16/2021 09/24/21   Benjiman Core, MD  pantoprazole (PROTONIX) 40 MG tablet Take 1 tablet (40 mg total) by mouth daily. 12/21/21 01/20/22  Erick Blinks, MD  predniSONE (DELTASONE) 20 MG tablet Take 2 tablets (40 mg total) by mouth daily with breakfast. Patient not taking: Reported on 02/22/2022 12/22/21   Erick Blinks, MD      Allergies    Contrast media [iodinated contrast media], Dilaudid [hydromorphone], Tramadol, Apple juice, Banana, Coconut (cocos nucifera), Dexamethasone, Other, Solu-medrol [methylprednisolone], and Toradol [ketorolac tromethamine]    Review of Systems   Review of Systems  HENT:  Positive for ear pain.     Physical Exam Updated Vital Signs BP (!) 145/120 (BP Location: Left Wrist)   Pulse 87   Temp 99.3 F (37.4 C) (Oral)   Resp 18   SpO2 98%  Physical Exam  ED Results / Procedures / Treatments   Labs (all labs ordered are listed, but only abnormal results are displayed) Labs Reviewed - No data to display  EKG None  Radiology No results found.  Procedures Procedures    Medications Ordered in ED Medications - No data to display  ED Course/ Medical Decision Making/ A&P                             Medical Decision Making     Final Clinical Impression(s) / ED  Diagnoses Final diagnoses:  None    Rx / DC Orders ED Discharge Orders     None

## 2023-05-29 NOTE — ED Triage Notes (Signed)
Pt presents to ED POV. Pt c/o R side of face hurting and R ear pain x6d. Pt reports R ear bleeding intermittently today. Denies sickness, allergies, or injury to ear.

## 2023-05-30 MED ORDER — IBUPROFEN 400 MG PO TABS
600.0000 mg | ORAL_TABLET | Freq: Once | ORAL | Status: AC
  Administered 2023-05-30: 600 mg via ORAL
  Filled 2023-05-30: qty 1

## 2023-05-30 MED ORDER — OXYCODONE-ACETAMINOPHEN 5-325 MG PO TABS
2.0000 | ORAL_TABLET | Freq: Once | ORAL | Status: AC
  Administered 2023-05-30: 2 via ORAL
  Filled 2023-05-30: qty 2

## 2023-05-30 MED ORDER — AMOXICILLIN-POT CLAVULANATE 875-125 MG PO TABS
1.0000 | ORAL_TABLET | Freq: Once | ORAL | Status: AC
  Administered 2023-05-30: 1 via ORAL
  Filled 2023-05-30: qty 1

## 2023-05-30 MED ORDER — AMOXICILLIN-POT CLAVULANATE 875-125 MG PO TABS: 1.0000 | ORAL_TABLET | Freq: Two times a day (BID) | ORAL | 0 refills | Status: AC

## 2023-05-30 MED ORDER — CIPROFLOXACIN-DEXAMETHASONE 0.3-0.1 % OT SUSP
4.0000 [drp] | Freq: Once | OTIC | Status: AC
  Administered 2023-05-30: 4 [drp] via OTIC
  Filled 2023-05-30: qty 7.5

## 2023-05-30 NOTE — Discharge Instructions (Addendum)
You are seen for a right-sided ear infection.  Take the antibiotics Augmentin as prescribed and do not miss any doses.  Continue 4 drops of the antibiotic eardrops to the right ear 2 times a day for the next 7 days.  You can take Tylenol and ibuprofen as needed for pain control.  Avoid going underwater or swimming while your ear is healing.  Make an appointment with the ENT doctor listed in your discharge paperwork in the next 1 to 2 weeks.  Come back if any severe worsening pain, increased drainage from the ear, severe neck pain and confusion, new facial droop, or any other symptoms concerning to you.

## 2023-09-09 ENCOUNTER — Emergency Department (HOSPITAL_BASED_OUTPATIENT_CLINIC_OR_DEPARTMENT_OTHER): Payer: MEDICAID

## 2023-09-09 ENCOUNTER — Other Ambulatory Visit: Payer: Self-pay

## 2023-09-09 ENCOUNTER — Emergency Department (HOSPITAL_BASED_OUTPATIENT_CLINIC_OR_DEPARTMENT_OTHER)
Admission: EM | Admit: 2023-09-09 | Discharge: 2023-09-09 | Disposition: A | Discharge status: Home / Self Care | Payer: MEDICAID | Attending: Emergency Medicine | Admitting: Emergency Medicine

## 2023-09-09 ENCOUNTER — Encounter (HOSPITAL_BASED_OUTPATIENT_CLINIC_OR_DEPARTMENT_OTHER): Payer: Self-pay | Admitting: Emergency Medicine

## 2023-09-09 DIAGNOSIS — R110 Nausea: Secondary | ICD-10-CM | POA: Insufficient documentation

## 2023-09-09 DIAGNOSIS — R1031 Right lower quadrant pain: Secondary | ICD-10-CM | POA: Insufficient documentation

## 2023-09-09 DIAGNOSIS — E119 Type 2 diabetes mellitus without complications: Secondary | ICD-10-CM | POA: Insufficient documentation

## 2023-09-09 DIAGNOSIS — J45909 Unspecified asthma, uncomplicated: Secondary | ICD-10-CM | POA: Diagnosis not present

## 2023-09-09 DIAGNOSIS — N939 Abnormal uterine and vaginal bleeding, unspecified: Secondary | ICD-10-CM | POA: Insufficient documentation

## 2023-09-09 DIAGNOSIS — Z7951 Long term (current) use of inhaled steroids: Secondary | ICD-10-CM | POA: Insufficient documentation

## 2023-09-09 LAB — URINALYSIS, ROUTINE W REFLEX MICROSCOPIC
Bilirubin Urine: NEGATIVE
Glucose, UA: NEGATIVE mg/dL
Hgb urine dipstick: NEGATIVE
Ketones, ur: NEGATIVE mg/dL
Leukocytes,Ua: NEGATIVE
Nitrite: NEGATIVE
Protein, ur: NEGATIVE mg/dL
Specific Gravity, Urine: >=1.03 (ref 1.005–1.030)
pH: 5.5 (ref 5.0–8.0)

## 2023-09-09 LAB — CBC
HCT: 38 % (ref 36.0–46.0)
Hemoglobin: 11.9 g/dL — ABNORMAL LOW (ref 12.0–15.0)
MCH: 27.9 pg (ref 26.0–34.0)
MCHC: 31.3 g/dL (ref 30.0–36.0)
MCV: 89 fL (ref 80.0–100.0)
Platelets: 336 10*3/uL (ref 150–400)
RBC: 4.27 MIL/uL (ref 3.87–5.11)
RDW: 14.8 % (ref 11.5–15.5)
WBC: 7 10*3/uL (ref 4.0–10.5)
nRBC: 0 % (ref 0.0–0.2)

## 2023-09-09 LAB — COMPREHENSIVE METABOLIC PANEL
ALT: 13 U/L (ref 0–44)
AST: 16 U/L (ref 15–41)
Albumin: 3.6 g/dL (ref 3.5–5.0)
Alkaline Phosphatase: 64 U/L (ref 38–126)
Anion gap: 10 (ref 5–15)
BUN: 13 mg/dL (ref 6–20)
CO2: 25 mmol/L (ref 22–32)
Calcium: 9.1 mg/dL (ref 8.9–10.3)
Chloride: 104 mmol/L (ref 98–111)
Creatinine, Ser: 0.67 mg/dL (ref 0.44–1.00)
GFR, Estimated: >60 mL/min (ref >60)
Glucose, Bld: 132 mg/dL — ABNORMAL HIGH (ref 70–99)
Potassium: 3.6 mmol/L (ref 3.5–5.1)
Sodium: 139 mmol/L (ref 135–145)
Total Bilirubin: 0.2 mg/dL — ABNORMAL LOW (ref 0.3–1.2)
Total Protein: 7.7 g/dL (ref 6.5–8.1)

## 2023-09-09 LAB — LIPASE, BLOOD: Lipase: 24 U/L (ref 11–51)

## 2023-09-09 LAB — PREGNANCY, URINE: Preg Test, Ur: NEGATIVE

## 2023-09-09 MED ORDER — SODIUM CHLORIDE 0.9 % IV BOLUS
1000.0000 mL | Freq: Once | INTRAVENOUS | Status: AC
  Administered 2023-09-09: 1000 mL via INTRAVENOUS

## 2023-09-09 MED ORDER — ONDANSETRON HCL 4 MG/2ML IJ SOLN
4.0000 mg | Freq: Once | INTRAMUSCULAR | Status: AC
  Administered 2023-09-09: 4 mg via INTRAVENOUS
  Filled 2023-09-09: qty 2

## 2023-09-09 MED ORDER — DICYCLOMINE HCL 20 MG PO TABS: 20.0000 mg | ORAL_TABLET | Freq: Two times a day (BID) | ORAL | 0 refills | Status: AC

## 2023-09-09 MED ORDER — FENTANYL CITRATE PF 50 MCG/ML IJ SOSY
50.0000 ug | PREFILLED_SYRINGE | Freq: Once | INTRAMUSCULAR | Status: AC
  Administered 2023-09-09: 50 ug via INTRAVENOUS
  Filled 2023-09-09: qty 1

## 2023-09-09 NOTE — ED Notes (Signed)
Pt ambulatory to bathroom, no assistance needed.

## 2023-09-09 NOTE — Discharge Instructions (Signed)
It was a pleasure taking care of you here in the emergency department  Your labs and imaging here are reassuring.  We had offered a pelvic exam which you declined.  I have written you for a medication called Bentyl to help with your abdominal pain.  You may take additional Tylenol at home.  Return to the emergency department for any worsening symptoms otherwise help with your primary care provider

## 2023-09-09 NOTE — ED Notes (Signed)
Patient transported to CT 

## 2023-09-09 NOTE — ED Triage Notes (Signed)
Rt lower abd pain x 3 days some nausea and diarrhea

## 2023-09-09 NOTE — ED Provider Notes (Signed)
[facility] Provider Note   CSN: [id number] Arrival date & time: 09/09/23  1315    History  Chief Complaint  Patient presents with   Abdominal Pain    Felicia Bradley is a 32 y.o. female history of type 2 diabetes, PE, GERD, asthma, avascular necrosis here for evaluation of abdominal pain.  States 3 days ago she developed right lower quadrant abdominal pain, radiates into her back.  Also noted some abnormal vaginal bleeding.  Does not have history of abnormal vag bleeding. Had menstrual cycle on the seventh of this month.  She is no longer bleeding.  She also had multiple episodes of loose stool without any blood.  Has had some nausea without vomiting.  No fever, chest pain, shortness of breath, dysuria, hematuria, vaginal discharge.  She denies any concerns for STD.  No meds PTA.  Prior history of C-section  HPI     Home Medications Prior to Admission medications   Medication Sig Start Date End Date Taking? Authorizing Provider  dicyclomine (BENTYL) 20 MG tablet Take 1 tablet (20 mg total) by mouth 2 (two) times daily. 09/09/23  Yes Augustus Zurawski A, PA-C  acetaminophen (TYLENOL) 500 MG tablet Take 500 mg by mouth every 6 (six) hours as needed for moderate pain.    [provider]  albuterol (VENTOLIN HFA) 108 (90 Base) MCG/ACT inhaler Inhale 1 puff into the lungs 2 (two) times daily as needed for wheezing or shortness of breath.  11/10/14   [provider]  amoxicillin-clavulanate (AUGMENTIN) 875-125 MG tablet Take 1 tablet by mouth every 12 (twelve) hours. 05/30/23   Mardene Sayer, MD  ARIPiprazole (ABILIFY) 2 MG tablet Take 2 mg by mouth daily.    [provider]  EPINEPHrine 0.3 mg/0.3 mL IJ SOAJ injection Inject 0.3 mg into the muscle as needed for anaphylaxis.  11/10/14   [provider]  ipratropium-albuterol (DUONEB) 0.5-2.5 (3) MG/3ML SOLN Take 3 mLs by nebulization every 6 (six) hours as  needed (sob/wheezing). 07/19/19   [provider]  pantoprazole (PROTONIX) 40 MG tablet Take 1 tablet (40 mg total) by mouth daily. 12/21/21 01/20/22  Erick Blinks, MD      Allergies    Contrast media [iodinated contrast media], Dilaudid [hydromorphone], Tramadol, Apple juice, Banana, Coconut (cocos nucifera), Dexamethasone, Other, Solu-medrol [methylprednisolone], and Toradol [ketorolac tromethamine]    Review of Systems   Review of Systems  Constitutional: Negative.   HENT: Negative.    Respiratory: Negative.    Cardiovascular: Negative.   Gastrointestinal:  Positive for abdominal pain, diarrhea and nausea. Negative for abdominal distention, anal bleeding, blood in stool, constipation, rectal pain and vomiting.  Genitourinary:  Positive for menstrual problem and vaginal bleeding. Negative for decreased urine volume, difficulty urinating, dyspareunia, dysuria, enuresis, flank pain, frequency, genital sores, hematuria, pelvic pain, urgency, vaginal discharge and vaginal pain.  Musculoskeletal: Negative.   Skin: Negative.   Neurological: Negative.   All other systems reviewed and are negative.   Physical Exam Updated Vital Signs BP (!) 125/90 (BP Location: Left Arm)   Pulse 87   Temp 98.4 F (36.9 C)   Resp 20   SpO2 99%  Physical Exam Vitals and nursing note reviewed.  Constitutional:      General: She is not in acute distress.    Appearance: She is well-developed. She is obese. She is not ill-appearing, toxic-appearing or diaphoretic.  HENT:     Head: Atraumatic.  Eyes:  Pupils: Pupils are equal, round, and reactive to light.  Cardiovascular:     Rate and Rhythm: Normal rate.     Heart sounds: Normal heart sounds.  Pulmonary:     Effort: Pulmonary effort is normal. No respiratory distress.     Breath sounds: Normal breath sounds.     Comments: Clear bilaterally, speaks in full sentences without difficulty Abdominal:     General: Bowel sounds are normal. There  is no distension.     Palpations: Abdomen is soft.     Tenderness: There is abdominal tenderness in the right lower quadrant. There is no right CVA tenderness, left CVA tenderness, guarding or rebound. Negative signs include Murphy's sign and McBurney's sign.     Comments: Difficult exam due to body habitus.  Diffusely tender to right lower quadrant and right lower back.  Genitourinary:    Comments: Patient DECLINED EXAM Musculoskeletal:        General: Normal range of motion.     Cervical back: Normal range of motion.     Comments: Full ROM of hips, nontender bilateral without overlying skin changes  Skin:    General: Skin is warm and dry.     Capillary Refill: Capillary refill takes less than 2 seconds.     Comments: No edema, erythema, warmth.  No obvious rash on exposed skin  Neurological:     General: No focal deficit present.     Mental Status: She is alert.  Psychiatric:        Mood and Affect: Mood normal.     ED Results / Procedures / Treatments   Labs (all labs ordered are listed, but only abnormal results are displayed) Labs Reviewed  COMPREHENSIVE METABOLIC PANEL - Abnormal; Notable for the following components:      Result Value   Glucose, Bld 132 (*)    Total Bilirubin 0.2 (*)    All other components within normal limits  CBC - Abnormal; Notable for the following components:   Hemoglobin 11.9 (*)    All other components within normal limits  LIPASE, BLOOD  URINALYSIS, ROUTINE W REFLEX MICROSCOPIC  PREGNANCY, URINE    EKG None  Radiology US PELVIC COMPLETE W TRANSVAGINAL AND TORSION R/O  Result Date: 09/09/2023 CLINICAL DATA:  Right lower quadrant pain EXAM: TRANSABDOMINAL AND TRANSVAGINAL ULTRASOUND OF PELVIS DOPPLER ULTRASOUND OF OVARIES TECHNIQUE: Both transabdominal and transvaginal ultrasound examinations of the pelvis were performed. Transabdominal technique was performed for global imaging of the pelvis including uterus, ovaries, adnexal regions, and  pelvic cul-de-sac. It was necessary to proceed with endovaginal exam following the transabdominal exam to visualize the uterus endometrium adnexa. Color and duplex Doppler ultrasound was utilized to evaluate blood flow to the ovaries. COMPARISON:  CT 09/09/2023 FINDINGS: Uterus Measurements: 7.9 x 5.1 x 5.2 cm = volume: 108.9 mL. No fibroids or other mass visualized. Endometrium Thickness: 6.8 mm.  IUD in the upper uterine segment. Right ovary Measurements: 3 x 2.2 x 3.1 cm = volume: 10.4 mL. Normal appearance/no adnexal mass. Left ovary Measurements: 3.2 x 2.1 x 2.4 cm = volume: 8.1 mL. Normal appearance/no adnexal mass. Pulsed Doppler evaluation of both ovaries demonstrates normal low-resistance arterial and venous waveforms. Other findings No abnormal free fluid. IMPRESSION: Negative pelvic ultrasound. IUD in the upper uterine segment. Electronically Signed   By: Jasmine Pang M.D.   On: 09/09/2023 17:22   CT ABDOMEN PELVIS WO CONTRAST  Result Date: 09/09/2023 CLINICAL DATA:  Right lower quadrant abdominal pain EXAM: CT ABDOMEN AND  PELVIS WITHOUT CONTRAST TECHNIQUE: Multidetector CT imaging of the abdomen and pelvis was performed following the standard protocol without IV contrast. RADIATION DOSE REDUCTION: This exam was performed according to the departmental dose-optimization program which includes automated exposure control, adjustment of the mA and/or kV according to patient size and/or use of iterative reconstruction technique. COMPARISON:  Prior CT scan of the abdomen and pelvis 09/24/2021 FINDINGS: Lower chest: No acute abnormality. Hepatobiliary: No focal liver abnormality is seen. No gallstones, gallbladder wall thickening, or biliary dilatation. Pancreas: Unremarkable. No pancreatic ductal dilatation or surrounding inflammatory changes. Spleen: No splenic injury or perisplenic hematoma. Adrenals/Urinary Tract: Adrenal glands are unremarkable. Kidneys are normal, without renal calculi, focal lesion,  or hydronephrosis. Bladder is unremarkable. Stomach/Bowel: Stomach is within normal limits. Appendix appears normal. No evidence of bowel wall thickening, distention, or inflammatory changes. Vascular/Lymphatic: Limited evaluation in the absence of intravenous contrast. No evidence of aneurysm. No significant atherosclerotic plaque. No suspicious lymphadenopathy. Reproductive: IUD in the expected location. The uterus is otherwise unremarkable. No adnexal masses. Other: No abdominal wall hernia or abnormality. No abdominopelvic ascites. Musculoskeletal: No acute or significant osseous findings. IMPRESSION: 1. No acute abnormality within the abdomen or pelvis. 2. IUD in place without evidence of complication. Electronically Signed   By: Malachy Moan M.D.   On: 09/09/2023 16:36    Procedures Procedures    Medications Ordered in ED Medications  fentaNYL (SUBLIMAZE) injection 50 mcg (50 mcg Intravenous Given 09/09/23 1508)  ondansetron (ZOFRAN) injection 4 mg (4 mg Intravenous Given 09/09/23 1508)  sodium chloride 0.9 % bolus 1,000 mL (0 mLs Intravenous Stopped 09/09/23 1633)    ED Course/ Medical Decision Making/ A&P   32 year old here for evaluation of abdominal pain that began 3 days ago.  No history of similar.  No urinary symptoms.  Did note some abnormal vaginal bleeding which she subsequently stopped.  She is also associated loose stools.  Some nausea that vomiting.  She denies any vaginal discharge or concern for STDs.  Prior history of C-section.  Abdomen diffusely tender to right lower abdomen to right lower back however negative CVA tap.  No urinary symptoms.  Will plan on labs, imaging and reassess  Labs and imaging personally viewed and interpreted:  CBC without leukocytosis, hemoglobin 11.9 Metabolic panel glucose 132 Lipase 24 Pregnancy test negative UA negative for infection Ultrasound negative for torsion, pregnancy, fluid collection CT without acute abnormality  Patient  reassessed.  Reassessed.  Tolerating p.o. intake.  Pain controlled.  We discussed her labs and imaging.  Unclear etiology of her symptoms.  I recommended pelvic exam, GC/wet prep.  Patient declined.  We discussed risk versus benefit.  She voices understanding of missed infectious process, surgical emergency.  She states she will follow-up with her primary care provider if her pain returns.  States she has to go home to take care of her infant.   Patient is nontoxic, nonseptic appearing, in no apparent distress.  Patient's pain and other symptoms adequately managed in emergency department.  Fluid bolus given.  Labs, imaging and vitals reviewed.  Patient does not meet the SIRS or Sepsis criteria.  On repeat exam patient does not have a surgical abdomin and there are no peritoneal signs.  No indication of appendicitis, bowel obstruction, bowel perforation, cholecystitis, diverticulitis, PID, intermittent/persistent torsion, TOA or ectopic pregnancy.  Patient discharged home with symptomatic treatment and given strict instructions for follow-up with their primary care physician.  I have also discussed reasons to return immediately to the ER.  Patient expresses understanding and agrees with plan.                                   Medical Decision Making Amount and/or Complexity of Data Reviewed External Data Reviewed: labs, radiology and notes. Labs: ordered. Decision-making details documented in ED Course. Radiology: ordered and independent interpretation performed. Decision-making details documented in ED Course.  Risk OTC drugs. Prescription drug management. Parenteral controlled substances. Decision regarding hospitalization. Diagnosis or treatment significantly limited by social determinants of health.           Final Clinical Impression(s) / ED Diagnoses Final diagnoses:  Right lower quadrant abdominal pain    Rx / DC Orders ED Discharge Orders          Ordered    dicyclomine  (BENTYL) 20 MG tablet  2 times daily        09/09/23 1746              Yassir Enis A, PA-C 09/09/23 1803    Sloan Leiter, DO 09/15/23 [phone]

## 2024-03-06 ENCOUNTER — Encounter (INDEPENDENT_AMBULATORY_CARE_PROVIDER_SITE_OTHER): Payer: Self-pay

## 2024-03-23 ENCOUNTER — Encounter: Payer: MEDICAID | Admitting: Family Medicine

## 2024-04-18 ENCOUNTER — Encounter: Payer: MEDICAID | Admitting: Nurse Practitioner
